# Patient Record
Sex: Male | Born: 1940
Health system: Southern US, Community
[De-identification: ages and names within clinical notes are randomized; demographics above are authoritative.]

## PROBLEM LIST (undated history)

## (undated) DIAGNOSIS — N529 Male erectile dysfunction, unspecified: Secondary | ICD-10-CM

## (undated) DIAGNOSIS — Z992 Dependence on renal dialysis: Secondary | ICD-10-CM

## (undated) DIAGNOSIS — N200 Calculus of kidney: Secondary | ICD-10-CM

## (undated) DIAGNOSIS — I219 Acute myocardial infarction, unspecified: Secondary | ICD-10-CM

## (undated) DIAGNOSIS — C801 Malignant (primary) neoplasm, unspecified: Secondary | ICD-10-CM

## (undated) DIAGNOSIS — Z87442 Personal history of urinary calculi: Secondary | ICD-10-CM

## (undated) DIAGNOSIS — I779 Disorder of arteries and arterioles, unspecified: Secondary | ICD-10-CM

## (undated) DIAGNOSIS — I10 Essential (primary) hypertension: Secondary | ICD-10-CM

## (undated) DIAGNOSIS — I739 Peripheral vascular disease, unspecified: Secondary | ICD-10-CM

## (undated) DIAGNOSIS — E785 Hyperlipidemia, unspecified: Secondary | ICD-10-CM

## (undated) DIAGNOSIS — I2510 Atherosclerotic heart disease of native coronary artery without angina pectoris: Secondary | ICD-10-CM

## (undated) DIAGNOSIS — Z9289 Personal history of other medical treatment: Secondary | ICD-10-CM

## (undated) DIAGNOSIS — K635 Polyp of colon: Secondary | ICD-10-CM

## (undated) DIAGNOSIS — N186 End stage renal disease: Secondary | ICD-10-CM

## (undated) HISTORY — DX: Hyperlipidemia, unspecified: E78.5

## (undated) HISTORY — DX: Essential (primary) hypertension: I10

## (undated) HISTORY — DX: Peripheral vascular disease, unspecified: I73.9

## (undated) HISTORY — PX: COLONOSCOPY W/ POLYPECTOMY: SHX1380

## (undated) HISTORY — PX: MOHS SURGERY: SHX181

## (undated) HISTORY — DX: Acute myocardial infarction, unspecified: I21.9

## (undated) HISTORY — DX: Calculus of kidney: N20.0

## (undated) HISTORY — DX: Atherosclerotic heart disease of native coronary artery without angina pectoris: I25.10

## (undated) HISTORY — DX: Disorder of arteries and arterioles, unspecified: I77.9

## (undated) HISTORY — DX: Polyp of colon: K63.5

## (undated) HISTORY — PX: HUMERUS FRACTURE SURGERY: SHX670

## (undated) HISTORY — DX: Malignant (primary) neoplasm, unspecified: C80.1

## (undated) HISTORY — DX: Male erectile dysfunction, unspecified: N52.9

## (undated) HISTORY — DX: Personal history of other medical treatment: Z92.89

## (undated) HISTORY — PX: CYSTOSCOPY: SUR368

---

## 2001-04-20 ENCOUNTER — Encounter: Payer: Self-pay | Admitting: Family Medicine

## 2001-04-20 ENCOUNTER — Encounter: Admission: RE | Admit: 2001-04-20 | Discharge: 2001-04-20 | Payer: Self-pay | Admitting: Family Medicine

## 2003-06-16 ENCOUNTER — Encounter: Admission: RE | Admit: 2003-06-16 | Discharge: 2003-06-16 | Payer: Self-pay | Admitting: Family Medicine

## 2003-11-07 ENCOUNTER — Ambulatory Visit (HOSPITAL_COMMUNITY): Admission: RE | Admit: 2003-11-07 | Discharge: 2003-11-07 | Payer: Self-pay | Admitting: General Surgery

## 2003-11-07 ENCOUNTER — Ambulatory Visit (HOSPITAL_BASED_OUTPATIENT_CLINIC_OR_DEPARTMENT_OTHER): Admission: RE | Admit: 2003-11-07 | Discharge: 2003-11-07 | Payer: Self-pay | Admitting: General Surgery

## 2003-11-07 ENCOUNTER — Encounter (INDEPENDENT_AMBULATORY_CARE_PROVIDER_SITE_OTHER): Payer: Self-pay | Admitting: Specialist

## 2004-04-13 ENCOUNTER — Ambulatory Visit: Payer: Self-pay | Admitting: Family Medicine

## 2004-06-10 ENCOUNTER — Ambulatory Visit: Payer: Self-pay | Admitting: Family Medicine

## 2004-07-29 ENCOUNTER — Ambulatory Visit: Payer: Self-pay | Admitting: Family Medicine

## 2004-08-30 ENCOUNTER — Ambulatory Visit: Payer: Self-pay | Admitting: Family Medicine

## 2004-10-26 ENCOUNTER — Ambulatory Visit: Payer: Self-pay | Admitting: Family Medicine

## 2004-11-26 ENCOUNTER — Ambulatory Visit: Payer: Self-pay | Admitting: Family Medicine

## 2005-03-07 ENCOUNTER — Ambulatory Visit: Payer: Self-pay | Admitting: Family Medicine

## 2005-03-08 ENCOUNTER — Encounter: Admission: RE | Admit: 2005-03-08 | Discharge: 2005-03-08 | Payer: Self-pay | Admitting: Family Medicine

## 2005-03-28 ENCOUNTER — Ambulatory Visit: Payer: Self-pay | Admitting: Cardiology

## 2005-04-11 ENCOUNTER — Ambulatory Visit: Payer: Self-pay

## 2005-04-11 ENCOUNTER — Ambulatory Visit: Payer: Self-pay | Admitting: Cardiology

## 2005-04-12 ENCOUNTER — Ambulatory Visit: Payer: Self-pay | Admitting: Family Medicine

## 2005-04-22 ENCOUNTER — Ambulatory Visit: Payer: Self-pay | Admitting: Family Medicine

## 2005-05-05 ENCOUNTER — Ambulatory Visit: Payer: Self-pay | Admitting: Family Medicine

## 2005-06-16 ENCOUNTER — Ambulatory Visit: Payer: Self-pay | Admitting: Family Medicine

## 2005-06-24 ENCOUNTER — Encounter: Admission: RE | Admit: 2005-06-24 | Discharge: 2005-09-22 | Payer: Self-pay | Admitting: Family Medicine

## 2005-07-18 ENCOUNTER — Ambulatory Visit: Payer: Self-pay | Admitting: Family Medicine

## 2005-10-17 ENCOUNTER — Ambulatory Visit: Payer: Self-pay | Admitting: Family Medicine

## 2005-10-24 ENCOUNTER — Encounter: Admission: RE | Admit: 2005-10-24 | Discharge: 2005-10-24 | Payer: Self-pay | Admitting: Nephrology

## 2005-11-14 ENCOUNTER — Ambulatory Visit: Payer: Self-pay | Admitting: Family Medicine

## 2006-01-17 ENCOUNTER — Ambulatory Visit: Payer: Self-pay | Admitting: Family Medicine

## 2006-03-23 ENCOUNTER — Ambulatory Visit: Payer: Self-pay | Admitting: Cardiology

## 2006-04-11 ENCOUNTER — Ambulatory Visit: Payer: Self-pay

## 2006-06-22 ENCOUNTER — Ambulatory Visit: Payer: Self-pay | Admitting: Family Medicine

## 2006-06-22 LAB — CONVERTED CEMR LAB: Hgb A1c MFr Bld: 8.2 % — ABNORMAL HIGH (ref 4.6–6.0)

## 2006-07-11 ENCOUNTER — Ambulatory Visit: Payer: Self-pay | Admitting: Family Medicine

## 2006-08-21 ENCOUNTER — Ambulatory Visit: Payer: Self-pay | Admitting: Family Medicine

## 2006-10-03 ENCOUNTER — Ambulatory Visit: Payer: Self-pay | Admitting: Family Medicine

## 2006-10-03 LAB — CONVERTED CEMR LAB
ALT: 22 units/L (ref 0–40)
AST: 19 units/L (ref 0–37)
Albumin: 3.8 g/dL (ref 3.5–5.2)
Basophils Absolute: 0.2 10*3/uL — ABNORMAL HIGH (ref 0.0–0.1)
Calcium: 9.1 mg/dL (ref 8.4–10.5)
Chloride: 106 meq/L (ref 96–112)
Cholesterol: 131 mg/dL (ref 0–200)
Creatinine,U: 67.1 mg/dL
Eosinophils Absolute: 1 10*3/uL — ABNORMAL HIGH (ref 0.0–0.6)
GFR calc non Af Amer: 40 mL/min
Hgb A1c MFr Bld: 7.2 % — ABNORMAL HIGH (ref 4.6–6.0)
MCHC: 34.5 g/dL (ref 30.0–36.0)
MCV: 90.1 fL (ref 78.0–100.0)
Monocytes Relative: 6.4 % (ref 3.0–11.0)
PSA: 0.76 ng/mL (ref 0.10–4.00)
Platelets: 182 10*3/uL (ref 150–400)
RBC: 4.78 M/uL (ref 4.22–5.81)
Sodium: 142 meq/L (ref 135–145)
Total CHOL/HDL Ratio: 3.2
Triglycerides: 149 mg/dL (ref 0–149)

## 2007-02-01 ENCOUNTER — Ambulatory Visit: Payer: Self-pay | Admitting: Family Medicine

## 2007-02-01 DIAGNOSIS — E785 Hyperlipidemia, unspecified: Secondary | ICD-10-CM | POA: Insufficient documentation

## 2007-02-01 DIAGNOSIS — I10 Essential (primary) hypertension: Secondary | ICD-10-CM | POA: Insufficient documentation

## 2007-02-01 DIAGNOSIS — Z794 Long term (current) use of insulin: Secondary | ICD-10-CM

## 2007-02-01 DIAGNOSIS — N259 Disorder resulting from impaired renal tubular function, unspecified: Secondary | ICD-10-CM | POA: Insufficient documentation

## 2007-02-01 DIAGNOSIS — I2510 Atherosclerotic heart disease of native coronary artery without angina pectoris: Secondary | ICD-10-CM

## 2007-02-01 DIAGNOSIS — I739 Peripheral vascular disease, unspecified: Secondary | ICD-10-CM | POA: Insufficient documentation

## 2007-02-01 DIAGNOSIS — E119 Type 2 diabetes mellitus without complications: Secondary | ICD-10-CM

## 2007-03-12 ENCOUNTER — Ambulatory Visit: Payer: Self-pay | Admitting: Gastroenterology

## 2007-03-20 ENCOUNTER — Ambulatory Visit: Payer: Self-pay | Admitting: Cardiology

## 2007-03-29 ENCOUNTER — Ambulatory Visit: Payer: Self-pay | Admitting: Gastroenterology

## 2007-03-29 ENCOUNTER — Encounter: Payer: Self-pay | Admitting: Gastroenterology

## 2007-03-29 DIAGNOSIS — D126 Benign neoplasm of colon, unspecified: Secondary | ICD-10-CM

## 2007-04-24 ENCOUNTER — Ambulatory Visit: Payer: Self-pay | Admitting: Family Medicine

## 2007-04-24 LAB — CONVERTED CEMR LAB: Hgb A1c MFr Bld: 6.8 % — ABNORMAL HIGH (ref 4.6–6.0)

## 2007-07-23 ENCOUNTER — Ambulatory Visit: Payer: Self-pay | Admitting: Family Medicine

## 2007-07-23 LAB — CONVERTED CEMR LAB
CO2: 28 meq/L (ref 19–32)
Creatinine, Ser: 1.9 mg/dL — ABNORMAL HIGH (ref 0.4–1.5)
Glucose, Bld: 67 mg/dL — ABNORMAL LOW (ref 70–99)
Potassium: 4.5 meq/L (ref 3.5–5.1)
Sodium: 143 meq/L (ref 135–145)

## 2007-08-10 ENCOUNTER — Telehealth: Payer: Self-pay | Admitting: Family Medicine

## 2007-08-16 DIAGNOSIS — E7800 Pure hypercholesterolemia, unspecified: Secondary | ICD-10-CM

## 2007-08-16 DIAGNOSIS — Z87442 Personal history of urinary calculi: Secondary | ICD-10-CM

## 2007-08-16 DIAGNOSIS — F528 Other sexual dysfunction not due to a substance or known physiological condition: Secondary | ICD-10-CM

## 2007-08-16 DIAGNOSIS — I214 Non-ST elevation (NSTEMI) myocardial infarction: Secondary | ICD-10-CM

## 2007-08-20 ENCOUNTER — Telehealth: Payer: Self-pay | Admitting: Family Medicine

## 2007-11-09 DIAGNOSIS — R112 Nausea with vomiting, unspecified: Secondary | ICD-10-CM

## 2007-11-12 ENCOUNTER — Ambulatory Visit: Payer: Self-pay | Admitting: Family Medicine

## 2007-11-15 ENCOUNTER — Telehealth: Payer: Self-pay | Admitting: Family Medicine

## 2007-11-15 ENCOUNTER — Ambulatory Visit: Payer: Self-pay | Admitting: Family Medicine

## 2007-11-15 ENCOUNTER — Inpatient Hospital Stay (HOSPITAL_COMMUNITY): Admission: EM | Admit: 2007-11-15 | Discharge: 2007-11-18 | Payer: Self-pay | Admitting: Family Medicine

## 2007-11-18 ENCOUNTER — Encounter: Payer: Self-pay | Admitting: Endocrinology

## 2007-11-20 ENCOUNTER — Ambulatory Visit: Payer: Self-pay | Admitting: Family Medicine

## 2007-11-26 ENCOUNTER — Telehealth: Payer: Self-pay | Admitting: Family Medicine

## 2008-02-06 ENCOUNTER — Telehealth: Payer: Self-pay | Admitting: Family Medicine

## 2008-03-13 ENCOUNTER — Ambulatory Visit: Payer: Self-pay | Admitting: Cardiology

## 2008-05-15 ENCOUNTER — Telehealth: Payer: Self-pay | Admitting: Family Medicine

## 2008-06-27 ENCOUNTER — Ambulatory Visit: Payer: Self-pay | Admitting: Family Medicine

## 2008-06-27 LAB — CONVERTED CEMR LAB
ALT: 45 units/L (ref 0–53)
AST: 32 units/L (ref 0–37)
Alkaline Phosphatase: 72 units/L (ref 39–117)
Basophils Absolute: 0 10*3/uL (ref 0.0–0.1)
Basophils Relative: 0.1 % (ref 0.0–3.0)
Blood in Urine, dipstick: NEGATIVE
CO2: 31 meq/L (ref 19–32)
Chloride: 104 meq/L (ref 96–112)
Creatinine, Ser: 2.5 mg/dL — ABNORMAL HIGH (ref 0.4–1.5)
Creatinine,U: 75 mg/dL
Direct LDL: 82.1 mg/dL
Eosinophils Absolute: 0.5 10*3/uL (ref 0.0–0.7)
Eosinophils Relative: 5.7 % — ABNORMAL HIGH (ref 0.0–5.0)
GFR calc non Af Amer: 27 mL/min
HDL: 33.1 mg/dL — ABNORMAL LOW (ref >39.0)
Hgb A1c MFr Bld: 7.4 % — ABNORMAL HIGH (ref 4.6–6.0)
Ketones, urine, test strip: NEGATIVE
Lymphocytes Relative: 17.4 % (ref 12.0–46.0)
MCHC: 33.4 g/dL (ref 30.0–36.0)
Neutrophils Relative %: 70 % (ref 43.0–77.0)
Nitrite: NEGATIVE
PSA: 0.77 ng/mL (ref 0.10–4.00)
Platelets: 289 10*3/uL (ref 150–400)
Potassium: 4.1 meq/L (ref 3.5–5.1)
RBC: 4.24 M/uL (ref 4.22–5.81)
TSH: 1.78 microintl units/mL (ref 0.35–5.50)
Total Bilirubin: 1 mg/dL (ref 0.3–1.2)
Triglycerides: 220 mg/dL (ref 0–149)
VLDL: 44 mg/dL — ABNORMAL HIGH (ref 0–40)
WBC Urine, dipstick: NEGATIVE
WBC: 8.7 10*3/uL (ref 4.5–10.5)

## 2008-06-30 ENCOUNTER — Telehealth: Payer: Self-pay | Admitting: Family Medicine

## 2008-07-21 ENCOUNTER — Ambulatory Visit: Payer: Self-pay | Admitting: Family Medicine

## 2008-09-22 ENCOUNTER — Telehealth: Payer: Self-pay | Admitting: Family Medicine

## 2008-09-29 ENCOUNTER — Ambulatory Visit: Payer: Self-pay | Admitting: Family Medicine

## 2008-09-29 LAB — CONVERTED CEMR LAB
Calcium: 8.9 mg/dL (ref 8.4–10.5)
GFR calc non Af Amer: 37.59 mL/min (ref >60)
Glucose, Bld: 97 mg/dL (ref 70–99)
Hgb A1c MFr Bld: 7 % — ABNORMAL HIGH (ref 4.6–6.5)
Potassium: 3.8 meq/L (ref 3.5–5.1)
Sodium: 145 meq/L (ref 135–145)

## 2008-10-02 ENCOUNTER — Ambulatory Visit: Payer: Self-pay | Admitting: Family Medicine

## 2008-10-02 DIAGNOSIS — M109 Gout, unspecified: Secondary | ICD-10-CM | POA: Insufficient documentation

## 2008-11-17 ENCOUNTER — Telehealth: Payer: Self-pay | Admitting: Family Medicine

## 2009-01-21 ENCOUNTER — Encounter (INDEPENDENT_AMBULATORY_CARE_PROVIDER_SITE_OTHER): Payer: Self-pay | Admitting: *Deleted

## 2009-01-26 ENCOUNTER — Ambulatory Visit: Payer: Self-pay | Admitting: Family Medicine

## 2009-01-26 LAB — CONVERTED CEMR LAB
BUN: 45 mg/dL — ABNORMAL HIGH (ref 6–23)
Chloride: 108 meq/L (ref 96–112)
GFR calc non Af Amer: 30.13 mL/min (ref >60)
Hgb A1c MFr Bld: 7.4 % — ABNORMAL HIGH (ref 4.6–6.5)
Potassium: 4.1 meq/L (ref 3.5–5.1)
Sodium: 145 meq/L (ref 135–145)

## 2009-02-02 ENCOUNTER — Ambulatory Visit: Payer: Self-pay | Admitting: Family Medicine

## 2009-03-04 ENCOUNTER — Telehealth: Payer: Self-pay | Admitting: Family Medicine

## 2009-05-04 ENCOUNTER — Ambulatory Visit: Payer: Self-pay | Admitting: Cardiology

## 2009-06-29 ENCOUNTER — Ambulatory Visit: Payer: Self-pay | Admitting: Family Medicine

## 2009-06-29 LAB — CONVERTED CEMR LAB
ALT: 22 units/L (ref 0–53)
Alkaline Phosphatase: 82 units/L (ref 39–117)
Bilirubin, Direct: 0.1 mg/dL (ref 0.0–0.3)
CO2: 31 meq/L (ref 19–32)
Chloride: 104 meq/L (ref 96–112)
Creatinine, Ser: 2.5 mg/dL — ABNORMAL HIGH (ref 0.4–1.5)
Direct LDL: 55.1 mg/dL
HDL: 31.4 mg/dL — ABNORMAL LOW (ref >39.00)
Potassium: 4.1 meq/L (ref 3.5–5.1)
Sodium: 143 meq/L (ref 135–145)
Total Bilirubin: 1.1 mg/dL (ref 0.3–1.2)
Total CHOL/HDL Ratio: 4
Total Protein: 6.7 g/dL (ref 6.0–8.3)
Triglycerides: 233 mg/dL — ABNORMAL HIGH (ref 0.0–149.0)

## 2009-07-06 ENCOUNTER — Ambulatory Visit: Payer: Self-pay | Admitting: Family Medicine

## 2009-07-06 LAB — CONVERTED CEMR LAB
Glucose, Urine, Semiquant: NEGATIVE
Ketones, urine, test strip: NEGATIVE
Nitrite: NEGATIVE
Specific Gravity, Urine: 1.01
WBC Urine, dipstick: NEGATIVE
pH: 6

## 2009-07-07 LAB — CONVERTED CEMR LAB
Basophils Absolute: 0.1 10*3/uL (ref 0.0–0.1)
Creatinine,U: 27.1 mg/dL
Eosinophils Absolute: 0.4 10*3/uL (ref 0.0–0.7)
Lymphocytes Relative: 20.2 % (ref 12.0–46.0)
MCHC: 33 g/dL (ref 30.0–36.0)
Microalb, Ur: 9.1 mg/dL — ABNORMAL HIGH (ref 0.0–1.9)
Monocytes Relative: 7.6 % (ref 3.0–12.0)
Neutrophils Relative %: 65 % (ref 43.0–77.0)
RDW: 14 % (ref 11.5–14.6)
TSH: 2.39 microintl units/mL (ref 0.35–5.50)

## 2009-09-21 ENCOUNTER — Ambulatory Visit: Payer: Self-pay | Admitting: Family Medicine

## 2009-09-21 LAB — CONVERTED CEMR LAB
BUN: 37 mg/dL — ABNORMAL HIGH (ref 6–23)
CO2: 30 meq/L (ref 19–32)
Chloride: 107 meq/L (ref 96–112)
Creatinine, Ser: 2.1 mg/dL — ABNORMAL HIGH (ref 0.4–1.5)
Glucose, Bld: 81 mg/dL (ref 70–99)

## 2009-09-28 ENCOUNTER — Ambulatory Visit: Payer: Self-pay | Admitting: Family Medicine

## 2009-12-22 ENCOUNTER — Ambulatory Visit: Payer: Self-pay | Admitting: Family Medicine

## 2009-12-22 LAB — CONVERTED CEMR LAB
BUN: 44 mg/dL — ABNORMAL HIGH (ref 6–23)
CO2: 31 meq/L (ref 19–32)
Calcium: 8.8 mg/dL (ref 8.4–10.5)
Creatinine, Ser: 2.2 mg/dL — ABNORMAL HIGH (ref 0.4–1.5)
Glucose, Bld: 97 mg/dL (ref 70–99)
Sodium: 142 meq/L (ref 135–145)

## 2009-12-23 ENCOUNTER — Encounter: Payer: Self-pay | Admitting: *Deleted

## 2010-01-04 ENCOUNTER — Ambulatory Visit: Payer: Self-pay | Admitting: Family Medicine

## 2010-01-12 ENCOUNTER — Telehealth: Payer: Self-pay | Admitting: Family Medicine

## 2010-03-09 ENCOUNTER — Encounter: Payer: Self-pay | Admitting: Gastroenterology

## 2010-04-05 ENCOUNTER — Ambulatory Visit: Payer: Self-pay | Admitting: Family Medicine

## 2010-04-05 LAB — CONVERTED CEMR LAB
BUN: 44 mg/dL — ABNORMAL HIGH (ref 6–23)
GFR calc non Af Amer: 32.45 mL/min (ref >60)
Glucose, Bld: 84 mg/dL (ref 70–99)
Potassium: 3.4 meq/L — ABNORMAL LOW (ref 3.5–5.1)

## 2010-04-12 ENCOUNTER — Ambulatory Visit: Payer: Self-pay | Admitting: Family Medicine

## 2010-05-15 DIAGNOSIS — I2510 Atherosclerotic heart disease of native coronary artery without angina pectoris: Secondary | ICD-10-CM

## 2010-05-15 HISTORY — DX: Atherosclerotic heart disease of native coronary artery without angina pectoris: I25.10

## 2010-06-28 ENCOUNTER — Encounter: Payer: Self-pay | Admitting: Family Medicine

## 2010-06-28 ENCOUNTER — Ambulatory Visit
Admission: RE | Admit: 2010-06-28 | Discharge: 2010-06-28 | Payer: Self-pay | Source: Home / Self Care | Attending: Family Medicine | Admitting: Family Medicine

## 2010-06-28 ENCOUNTER — Other Ambulatory Visit: Payer: Self-pay | Admitting: Family Medicine

## 2010-06-28 LAB — BASIC METABOLIC PANEL
CO2: 31 mEq/L (ref 19–32)
Calcium: 9.4 mg/dL (ref 8.4–10.5)
Chloride: 101 mEq/L (ref 96–112)
Potassium: 4.5 mEq/L (ref 3.5–5.1)
Sodium: 141 mEq/L (ref 135–145)

## 2010-06-28 LAB — CBC WITH DIFFERENTIAL/PLATELET
Basophils Relative: 0.8 % (ref 0.0–3.0)
Eosinophils Absolute: 0.4 10*3/uL (ref 0.0–0.7)
HCT: 42.5 % (ref 39.0–52.0)
Hemoglobin: 14.4 g/dL (ref 13.0–17.0)
Lymphocytes Relative: 20.5 % (ref 12.0–46.0)
Lymphs Abs: 1.8 10*3/uL (ref 0.7–4.0)
MCHC: 33.9 g/dL (ref 30.0–36.0)
Neutro Abs: 5.7 10*3/uL (ref 1.4–7.7)
RBC: 4.57 Mil/uL (ref 4.22–5.81)

## 2010-06-28 LAB — HEPATIC FUNCTION PANEL
AST: 18 U/L (ref 0–37)
Albumin: 4 g/dL (ref 3.5–5.2)
Alkaline Phosphatase: 82 U/L (ref 39–117)
Total Protein: 6.7 g/dL (ref 6.0–8.3)

## 2010-06-28 LAB — CONVERTED CEMR LAB
Ketones, urine, test strip: NEGATIVE
Nitrite: NEGATIVE
Urobilinogen, UA: 0.2

## 2010-06-28 LAB — MICROALBUMIN / CREATININE URINE RATIO: Microalb, Ur: 6.5 mg/dL — ABNORMAL HIGH (ref 0.0–1.9)

## 2010-07-04 LAB — CONVERTED CEMR LAB
BUN: 43 mg/dL — ABNORMAL HIGH (ref 6–23)
Bilirubin Urine: NEGATIVE
CO2: 29 meq/L (ref 19–32)
GFR calc Af Amer: 41 mL/min
Glucose, Bld: 111 mg/dL — ABNORMAL HIGH (ref 70–99)
Potassium: 4 meq/L (ref 3.5–5.1)
Sodium: 139 meq/L (ref 135–145)
Urobilinogen, UA: 0.2
WBC Urine, dipstick: NEGATIVE

## 2010-07-08 NOTE — Assessment & Plan Note (Signed)
Summary: 3 month rov/njr   Vital Signs:  Patient profile:   70 year old male Weight:      180 pounds Temp:     98.5 degrees F oral BP sitting:   130 / 80  (left arm) Cuff size:   regular  Vitals Entered By: Kern Reap CMA Duncan Dull) (January 04, 2010 10:04 AM)  CC: follow-up visit Is Patient Diabetic? Yes Did you bring your meter with you today? No   Primary Care Provider:  Roderick Pee MD  CC:  follow-up visit.  History of Present Illness: Douglas Meyer is a 70 year old, married male, nonsmoker, who comes in today for follow-up of diabetes, type I.  he states he feels well and has no complications from his medication.  This morning.  His blood sugar fasting was 63.  He states is averaging about 100 at home.  His BUN was 37, now is 44, creatinine 2.1, now 2.2, GFR 33, now 32.  Therefore, no significant change in renal function over the past 3 months.  His hemoglobin A1c was 7.2, now at 7.4.  However, since he is getting low sugars in the morning.  Will continue his current medication    Allergies (verified): No Known Drug Allergies  Past History:  Past medical, surgical, family and social histories (including risk factors) reviewed for relevance to current acute and chronic problems.  Past Medical History: Reviewed history from 05/04/2009 and no changes required. Coronary artery disease (out of hospital myocardial infarction in     the 1990's.  This was picked up on an EKG in 1997.  Cardiac     catheterization demonstrated an occluded vessel and collaterals.  I     have no description of this.  A stress perfusion study done 2008      demonstrated an ejection fraction of 53% with inferolateral     hypokinesis.  There was an inferolateral defect with scar and some     mild mixed ischemia.  We managed this medically). Diabetes mellitus, type I Hyperlipidemia Hypertension Peripheral vascular disease Renal insufficiency ED Nephrolithiasis, hx of  Past Surgical History: Reviewed  history from 08/16/2007 and no changes required. Lt Humerus Fracture Multiple skin cancers  Family History: Reviewed history from 07/23/2007 and no changes required. father died 70, MI, smoker mother died of an MI 3 brothers, one died, diabetes, rheumatic fever peptic ulcer disease.  The others had two heart attacks in this diabetic type II one sister died in a fire one died at 19 of diabetes  Social History: Reviewed history from 07/23/2007 and no changes required. Retired Engineer, production Married Former Smoker Alcohol use-yes Alcohol use-no Drug use-no Regular exercise-yes  Review of Systems      See HPI  Physical Exam  General:  Well-developed,well-nourished,in no acute distress; alert,appropriate and cooperative throughout examination  Diabetes Management Exam:    Foot Exam (with socks and/or shoes not present):       Sensory-Pinprick/Light touch:          Left medial foot (L-4): normal          Left dorsal foot (L-5): normal          Left lateral foot (S-1): normal          Right medial foot (L-4): normal          Right dorsal foot (L-5): normal          Right lateral foot (S-1): normal       Sensory-Monofilament:  Left foot: normal          Right foot: normal       Inspection:          Left foot: normal          Right foot: normal       Nails:          Left foot: normal          Right foot: normal    Eye Exam:       Eye Exam done elsewhere          Date: 12/09/2009          Results: normal          Done by: bevis   Impression & Recommendations:  Problem # 1:  DIABETES MELLITUS, TYPE I (ICD-250.01) Assessment Unchanged  His updated medication list for this problem includes:    Glipizide 10 Mg Tb24 (Glipizide) .Marland Kitchen..Marland Kitchen Two tabs daily    Lantus Solostar 100 Unit/ml Soln (Insulin glargine) .Marland Kitchen... 31 u    Aspirin 325 Mg Tabs (Aspirin) .Marland Kitchen... 1 by mouth daily    Tarka 4-240 Mg Cr-tabs (Trandolapril-verapamil hcl) .Marland Kitchen... Take one tab at bedtime  Complete  Medication List: 1)  Glipizide 10 Mg Tb24 (Glipizide) .... Two tabs daily 2)  Metoprolol Succinate 200 Mg Tb24 (Metoprolol succinate) .Marland Kitchen.. 1 once daily 3)  Nitroquick 0.4 Mg Subl (Nitroglycerin) .... As needed 4)  Lantus Solostar 100 Unit/ml Soln (Insulin glargine) .... 31 u 5)  Aspirin 325 Mg Tabs (Aspirin) .Marland Kitchen.. 1 by mouth daily 6)  Furosemide 80 Mg Tabs (Furosemide) .... Two tabs a day 7)  Multi-vitamin 300  .... Once daily 8)  Tarka 4-240 Mg Cr-tabs (Trandolapril-verapamil hcl) .... Take one tab at bedtime 9)  Hectorol 0.5 Mcg Caps (Doxercalciferol) .... Take three  tabs every morning 10)  Levitra 20 Mg Tabs (Vardenafil hcl) .... Uad 11)  Vitamin C 500 Mg Tabs (Ascorbic acid) .... Once daily 12)  Accu-chek Multiclix Lancets Misc (Lancets) .... Use to check glucose daily 13)  Accu-chek Aviva Strp (Glucose blood) .... Use daily to check glucose 14)  Bd Insulin Syringe Ultrafine 31g X 5/16" 1 Ml Misc (Insulin syringe-needle u-100) .... Use daily for glucose control 15)  Ketoconazole 2 % Crea (Ketoconazole) .... Two times a day 16)  Allopurinol 300 Mg Tabs (Allopurinol) .... Take one tab by mouth once daily  Patient Instructions: 1)  Please schedule a follow-up appointment in 3 months..250.01 2)  BMP prior to visit, ICD-9: 3)  HbgA1C prior to visit, ICD-9:

## 2010-07-08 NOTE — Assessment & Plan Note (Signed)
Summary: 3 month rov/njr   Vital Signs:  Patient profile:   70 year old male Weight:      181 pounds Temp:     98.4 degrees F oral BP sitting:   124 / 70  (left arm) Cuff size:   regular  Vitals Entered By: Kern Reap CMA Duncan Dull) (September 28, 2009 1:33 PM)  CC: follow-up visit Is Patient Diabetic? Yes Did you bring your meter with you today? No   Primary Care Provider:  Roderick Pee MD  CC:  follow-up visit.  History of Present Illness: Douglas Meyer is a 70 y/o male ns who comes in fro evel. of diabetes.  His current treatment program is 31 units of Lantus nightly along with glipizide 10 mg b.i.d..  His fasting blood sugars 81, with a hemoglobin A1c of 7.2%.  He is having episodes once or twice a week where his blood sugar drops into the 50s and 60s.  Dr. Raquel James his nephrologist, stopped his simvastatin, and allopurinol.  Renal function shows a GFR of 33.4, with a creatinine of 2.1.  Allergies: No Known Drug Allergies PMH-FH-SH reviewed for relevance  Review of Systems      See HPI  Physical Exam  General:  Well-developed,well-nourished,in no acute distress; alert,appropriate and cooperative throughout examination   Impression & Recommendations:  Problem # 1:  DIABETES MELLITUS, TYPE I (ICD-250.01) Assessment Improved  His updated medication list for this problem includes:    Glipizide 10 Mg Tb24 (Glipizide) .Marland Kitchen..Marland Kitchen Two tabs daily    Lantus Solostar 100 Unit/ml Soln (Insulin glargine) .Marland Kitchen... 31 u    Aspirin 325 Mg Tabs (Aspirin) .Marland Kitchen... 1 by mouth daily    Tarka 4-240 Mg Cr-tabs (Trandolapril-verapamil hcl) .Marland Kitchen... Take one tab at bedtime  Complete Medication List: 1)  Glipizide 10 Mg Tb24 (Glipizide) .... Two tabs daily 2)  Metoprolol Succinate 200 Mg Tb24 (Metoprolol succinate) .Marland Kitchen.. 1 once daily 3)  Nitroquick 0.4 Mg Subl (Nitroglycerin) .... As needed 4)  Lantus Solostar 100 Unit/ml Soln (Insulin glargine) .... 31 u 5)  Aspirin 325 Mg Tabs (Aspirin) .Marland Kitchen.. 1 by mouth  daily 6)  Furosemide 80 Mg Tabs (Furosemide) .... Two tabs a day 7)  Multi-vitamin 300  .... Once daily 8)  Tarka 4-240 Mg Cr-tabs (Trandolapril-verapamil hcl) .... Take one tab at bedtime 9)  Hectorol 0.5 Mcg Caps (Doxercalciferol) .... Take three  tabs every morning 10)  Levitra 20 Mg Tabs (Vardenafil hcl) .... Uad 11)  Vitamin C 500 Mg Tabs (Ascorbic acid) .... Once daily 12)  Accu-chek Multiclix Lancets Misc (Lancets) .... Use to check glucose daily 13)  Accu-chek Aviva Strp (Glucose blood) .... Use daily to check glucose 14)  Bd Insulin Syringe Ultrafine 31g X 5/16" 1 Ml Misc (Insulin syringe-needle u-100) .... Use daily for glucose control 15)  Ketoconazole 2 % Crea (Ketoconazole) .... Two times a day  Patient Instructions: 1)  See your eye doctor yearly to check for diabetic eye damage. 2)  continue current treatment program.  Follow-up in 3 months 3)  BMP prior to visit, ICD-9:...........250.01 4)  HbgA1C prior to visit, ICD-9:

## 2010-07-08 NOTE — Assessment & Plan Note (Signed)
Summary: cpx/mm   Vital Signs:  Patient profile:   70 year old male Height:      65.75 inches Weight:      174 pounds BP sitting:   180 / 92  (left arm) Cuff size:   regular  Vitals Entered By: Kern Reap CMA Duncan Dull) (July 06, 2009 8:33 AM)  Reason for Visit cpx   Primary Care Provider:  Roderick Pee MD   History of Present Illness: Douglas Meyer is a 70 year old male, married, nonsmoker, who comes in today for evaluation of multiple issues.  His underlying hypertension, for which he takes a beta-blocker to her milligrams daily, Lasix, 80 mg b.i.d., Tarka 4 -- 240 nightly  BP today 180/92.  These did to see his nephrologist this week because his blood pressure is not under good control.  His hyperlipidemia is managed with simvastatin 80 mg nightly, LDL is 55.  His diabetes is managed with glipizide 10 mg b.i.d., Lantus 31 units nightly.  Blood sugar 83.  A1c today.  He takes allopurinol 3 mg daily for gout.  Will check a uric acid level today.  He also has chronic renal disease and renal insufficiency.  BUN 44, creatinine 2.5, GFR 27.  He's been going to an optical shop for an ophthalmologic examination recommended.  He s C. Dr. Kendrick Ranch ophthalmologist.  He gets routine dental care.  Colonoscopy two years ago, was normal except for two small polyps.  His cardiology evaluation by Dr. Leta Jungling was normal.  He told him to come back in 18 months.  Tetanus 2002, seasonal flu 2010, Pneumovax 2008, shingles 2010.  He's also tried various medications for recto dysfunction.  Nothing seems to work.  Recommend urology consult p.r.n.  Allergies (verified): No Known Drug Allergies  Past History:  Past medical, surgical, family and social histories (including risk factors) reviewed, and no changes noted (except as noted below).  Past Medical History: Reviewed history from 05/04/2009 and no changes required. Coronary artery disease (out of hospital myocardial infarction in  the 1990's.  This was picked up on an EKG in 1997.  Cardiac     catheterization demonstrated an occluded vessel and collaterals.  I     have no description of this.  A stress perfusion study done 2008      demonstrated an ejection fraction of 53% with inferolateral     hypokinesis.  There was an inferolateral defect with scar and some     mild mixed ischemia.  We managed this medically). Diabetes mellitus, type I Hyperlipidemia Hypertension Peripheral vascular disease Renal insufficiency ED Nephrolithiasis, hx of  Past Surgical History: Reviewed history from 08/16/2007 and no changes required. Lt Humerus Fracture Multiple skin cancers  Family History: Reviewed history from 07/23/2007 and no changes required. father died 84, MI, smoker mother died of an MI 3 brothers, one died, diabetes, rheumatic fever peptic ulcer disease.  The others had two heart attacks in this diabetic type II one sister died in a fire one died at 71 of diabetes  Social History: Reviewed history from 07/23/2007 and no changes required. Retired Engineer, production Married Former Smoker Alcohol use-yes Alcohol use-no Drug use-no Regular exercise-yes  Review of Systems      See HPI  Physical Exam  General:  Well-developed,well-nourished,in no acute distress; alert,appropriate and cooperative throughout examination Head:  Normocephalic and atraumatic without obvious abnormalities. No apparent alopecia or balding. Eyes:  No corneal or conjunctival inflammation noted. EOMI. Perrla. Funduscopic exam benign, without hemorrhages, exudates or papilledema.  Vision grossly normal. Ears:  External ear exam shows no significant lesions or deformities.  Otoscopic examination reveals clear canals, tympanic membranes are intact bilaterally without bulging, retraction, inflammation or discharge. Hearing is grossly normal bilaterally. Nose:  External nasal examination shows no deformity or inflammation. Nasal mucosa are pink and  moist without lesions or exudates. Mouth:  Oral mucosa and oropharynx without lesions or exudates.  Teeth in good repair. Neck:  No deformities, masses, or tenderness noted. Chest Wall:  No deformities, masses, tenderness or gynecomastia noted. Breasts:  No masses or gynecomastia noted Lungs:  Normal respiratory effort, chest expands symmetrically. Lungs are clear to auscultation, no crackles or wheezes. Heart:  Normal rate and regular rhythm. S1 and S2 normal without gallop, murmur, click, rub or other extra sounds. Abdomen:  Bowel sounds positive,abdomen soft and non-tender without masses, organomegaly or hernias noted. Rectal:  No external abnormalities noted. Normal sphincter tone. No rectal masses or tenderness. Genitalia:  Testes bilaterally descended without nodularity, tenderness or masses. No scrotal masses or lesions. No penis lesions or urethral discharge. Prostate:  Prostate gland firm and smooth, no enlargement, nodularity, tenderness, mass, asymmetry or induration. Msk:  No deformity or scoliosis noted of thoracic or lumbar spine.   Pulses:  R and L carotid,radial,femoral,dorsalis pedis and posterior tibial pulses are full and equal bilaterally Extremities:  No clubbing, cyanosis, edema, or deformity noted with normal full range of motion of all joints.   Neurologic:  No cranial nerve deficits noted. Station and gait are normal. Plantar reflexes are down-going bilaterally. DTRs are symmetrical throughout. Sensory, motor and coordinative functions appear intact. Skin:  Intact without suspicious lesions or rashes...........Marland KitchenAllergies:  his toe nail shows severe fungal disease.  Recommend soaking in filing weekly Cervical Nodes:  No lymphadenopathy noted Axillary Nodes:  No palpable lymphadenopathy Inguinal Nodes:  No significant adenopathy Psych:  Cognition and judgment appear intact. Alert and cooperative with normal attention span and concentration. No apparent delusions, illusions,  hallucinations  Diabetes Management Exam:    Foot Exam (with socks and/or shoes not present):       Sensory-Pinprick/Light touch:          Left medial foot (L-4): normal          Left dorsal foot (L-5): normal          Left lateral foot (S-1): normal          Right medial foot (L-4): normal          Right dorsal foot (L-5): normal          Right lateral foot (S-1): normal       Sensory-Monofilament:          Left foot: normal          Right foot: normal       Inspection:          Left foot: normal          Right foot: normal       Nails:          Left foot: normal          Right foot: normal    Eye Exam:       Eye Exam done elsewhere          Date: 08/20/2007          Results: normal          Done by: optom   Impression & Recommendations:  Problem # 1:  GOUT, UNSPECIFIED (ICD-274.9)  Assessment Improved  His updated medication list for this problem includes:    Allopurinol 300 Mg Tabs (Allopurinol) .Marland Kitchen... Take one tab once daily  Orders: Venipuncture ([address]) TLB-CBC Platelet - w/Differential (85025-CBCD) TLB-TSH (Thyroid Stimulating Hormone) (84443-TSH) TLB-Microalbumin/Creat Ratio, Urine (82043-MALB) TLB-PSA (Prostate Specific Antigen) (84153-PSA) TLB-A1C / Hgb A1C (Glycohemoglobin) (83036-A1C) TLB-Uric Acid, Blood (84550-URIC) Prescription Created Electronically [phone]) UA Dipstick w/o Micro (automated)  (81003)  Problem # 2:  ERECTILE DYSFUNCTION (ICD-302.72) Assessment: Deteriorated  His updated medication list for this problem includes:    Levitra 20 Mg Tabs (Vardenafil hcl) ..... Uad  Problem # 3:  HYPERCHOLESTEROLEMIA (ICD-272.0) Assessment: Improved  His updated medication list for this problem includes:    Simvastatin 80 Mg Tabs (Simvastatin) .Marland Kitchen... 1 once daily  Problem # 4:  RENAL INSUFFICIENCY (ICD-588.9) Assessment: Unchanged  Orders: Venipuncture (09811) TLB-CBC Platelet - w/Differential (85025-CBCD) TLB-TSH (Thyroid Stimulating Hormone)  (84443-TSH) TLB-Microalbumin/Creat Ratio, Urine (82043-MALB) TLB-PSA (Prostate Specific Antigen) (84153-PSA) TLB-A1C / Hgb A1C (Glycohemoglobin) (83036-A1C) TLB-Uric Acid, Blood (84550-URIC) Prescription Created Electronically (B1478) UA Dipstick w/o Micro (automated)  (81003)  Problem # 5:  HYPERTENSION (ICD-401.9) Assessment: Deteriorated  His updated medication list for this problem includes:    Metoprolol Succinate 200 Mg Tb24 (Metoprolol succinate) .Marland Kitchen... 1 once daily    Furosemide 80 Mg Tabs (Furosemide) .Marland Kitchen..Marland Kitchen Two tabs a day    Tarka 4-240 Mg Cr-tabs (Trandolapril-verapamil hcl) .Marland Kitchen... Take one tab at bedtime  Orders: Venipuncture (29562) TLB-CBC Platelet - w/Differential (85025-CBCD) TLB-TSH (Thyroid Stimulating Hormone) (84443-TSH) TLB-Microalbumin/Creat Ratio, Urine (82043-MALB) TLB-PSA (Prostate Specific Antigen) (84153-PSA) TLB-A1C / Hgb A1C (Glycohemoglobin) (83036-A1C) TLB-Uric Acid, Blood (84550-URIC) Prescription Created Electronically [phone]) UA Dipstick w/o Micro (automated)  (81003)  Problem # 6:  DIABETES MELLITUS, TYPE I (ICD-250.01) Assessment: Improved  His updated medication list for this problem includes:    Glipizide 10 Mg Tb24 (Glipizide) .Marland Kitchen..Marland Kitchen Two tabs daily    Lantus Solostar 100 Unit/ml Soln (Insulin glargine) .Marland Kitchen... 31 u    Aspirin 325 Mg Tabs (Aspirin) .Marland Kitchen... 1 by mouth daily    Tarka 4-240 Mg Cr-tabs (Trandolapril-verapamil hcl) .Marland Kitchen... Take one tab at bedtime  Orders: Venipuncture (57846) TLB-CBC Platelet - w/Differential (85025-CBCD) TLB-TSH (Thyroid Stimulating Hormone) (84443-TSH) TLB-Microalbumin/Creat Ratio, Urine (82043-MALB) TLB-PSA (Prostate Specific Antigen) (84153-PSA) TLB-A1C / Hgb A1C (Glycohemoglobin) (83036-A1C) TLB-Uric Acid, Blood (84550-URIC) Prescription Created Electronically [phone]) UA Dipstick w/o Micro (automated)  (81003)  Complete Medication List: 1)  Glipizide 10 Mg Tb24 (Glipizide) .... Two tabs daily 2)  Metoprolol  Succinate 200 Mg Tb24 (Metoprolol succinate) .Marland Kitchen.. 1 once daily 3)  Simvastatin 80 Mg Tabs (Simvastatin) .Marland Kitchen.. 1 once daily 4)  Nitroquick 0.4 Mg Subl (Nitroglycerin) .... As needed 5)  Lantus Solostar 100 Unit/ml Soln (Insulin glargine) .... 31 u 6)  Aspirin 325 Mg Tabs (Aspirin) .Marland Kitchen.. 1 by mouth daily 7)  Furosemide 80 Mg Tabs (Furosemide) .... Two tabs a day 8)  Multi-vitamin 300  .... Once daily 9)  Tarka 4-240 Mg Cr-tabs (Trandolapril-verapamil hcl) .... Take one tab at bedtime 10)  Hectorol 0.5 Mcg Caps (Doxercalciferol) .... Take three  tabs every morning 11)  Levitra 20 Mg Tabs (Vardenafil hcl) .... Uad 12)  Vitamin C 500 Mg Tabs (Ascorbic acid) .... Once daily 13)  Accu-chek Multiclix Lancets Misc (Lancets) .... Use to check glucose daily 14)  Accu-chek Aviva Strp (Glucose blood) .... Use daily to check glucose 15)  Bd Insulin Syringe Ultrafine 31g X 5/16" 1 Ml Misc (Insulin syringe-needle u-100) .... Use daily for glucose control 16)  Allopurinol  300 Mg Tabs (Allopurinol) .... Take one tab once daily  Patient Instructions: 1)  See your eye doctor yearly to check for diabetic eye damage...................Marland KitchenDr. Mia Creek 2)  Please schedule a follow-up appointment in 3 months. 3)  Check your blood sugars regularly. If your readings are usually above : or below 70 you should contact our office. 4)  It is important that your Diabetic A1c level is checked every 3 months. 5)  See your eye doctor yearly to check for diabetic eye damage. 6)  Check your feet each night for sore areas, calluses or signs of infection. 7)  Check your Blood Pressure regularly. If it is above: you should make an appointment. 8)  BMP prior to visit, ICD-9:.........250.01 9)  HbgA1C prior to visit, ICD-9: Prescriptions: ALLOPURINOL 300 MG TABS (ALLOPURINOL) take one tab once daily  #90 x 3   Entered and Authorized by:   Roderick Pee MD   Signed by:   Roderick Pee MD on 07/06/2009   Method used:    Electronically to        [facility]  [address] [facility]* (retail)       [address]       [place], [place]  [address]       Ph: [phone] or [phone]       Fax: [phone]   RxID:   [id number] BD INSULIN SYRINGE ULTRAFINE 31G X 5/16" 1 ML MISC (INSULIN SYRINGE-NEEDLE U-100) use daily for glucose control  #100 x 3   Entered and Authorized by:   Roderick Pee MD   Signed by:   Roderick Pee MD on 07/06/2009   Method used:   Electronically to        [facility]  [address] [facility]* (retail)       [address]       [place], [place]  [address]       Ph: [phone] or [phone]       Fax: [phone]   RxID:   [id number] ACCU-CHEK AVIVA  STRP (GLUCOSE BLOOD) use daily to check glucose  #90 x prn   Entered and Authorized by:   Roderick Pee MD   Signed by:   Roderick Pee MD on 07/06/2009   Method used:   Electronically to        [facility]  [address] [facility]* (retail)       [address]       [place], [place]  [address]       Ph: [phone] or [phone]       Fax: [phone]   RxID:   [id number] ACCU-CHEK MULTICLIX LANCETS  MISC (LANCETS) use to check glucose daily  #100 x prn   Entered and Authorized by:   Roderick Pee MD   Signed by:   Roderick Pee MD on 07/06/2009   Method used:   Electronically to        [facility] [address] [facility]* (retail)       [address]       [place], [place]  [address]       Ph: [phone] or [phone]       Fax: [phone]   RxID:   [id number] TARKA 4-240 MG CR-TABS (TRANDOLAPRIL-VERAPAMIL HCL) take  one tab at bedtime  #100 x 4   Entered and Authorized by:   Roderick Pee MD   Signed by:   Roderick Pee MD on 07/06/2009   Method used:   Electronically to        [facility] [address] [facility]* (retail)       [address]       [place], [place]  [address]       Ph: [phone] or  [phone]       Fax: [phone]   RxID:   [id number] FUROSEMIDE 80 MG  TABS (FUROSEMIDE) two tabs a day  #200 x 4   Entered and Authorized by:   Roderick Pee MD   Signed by:   Roderick Pee MD on 07/06/2009   Method used:   Electronically to        [facility]  [address] [facility]* (retail)       [address]       [place], [place]  [address]       Ph: [phone] or [phone]       Fax: [phone]   RxID:   [id number] LANTUS SOLOSTAR 100 UNIT/ML  SOLN (INSULIN GLARGINE) 31 u  #2 x 4   Entered and Authorized by:   Roderick Pee MD   Signed by:   Roderick Pee MD on 07/06/2009   Method used:   Electronically to        [facility]  [address] [facility]* (retail)       [address]       [place], [place]  [address]       Ph: [phone] or [phone]       Fax: [phone]   RxID:   [id number] NITROQUICK 0.4 MG  SUBL (NITROGLYCERIN) as needed  #25 x 1   Entered and Authorized by:   Roderick Pee MD   Signed by:   Roderick Pee MD on 07/06/2009   Method used:   Electronically to        [facility]  [address] [facility]* (retail)       [address]       [place], [place]  [address]       Ph: [phone] or [phone]       Fax: [phone]   RxID:   [id number] SIMVASTATIN 80 MG TABS (SIMVASTATIN) 1 once daily  #100 x 4   Entered and Authorized by:   Roderick Pee MD   Signed by:   Roderick Pee MD on 07/06/2009   Method used:   Electronically to        [facility]  [address] [facility]* (retail)       [address]       [place], [place]  [address]       Ph: [phone] or [phone]       Fax: [phone]   RxID:   [id number] METOPROLOL SUCCINATE 200 MG TB24 (METOPROLOL SUCCINATE) 1 once daily  #100 x 4   Entered and Authorized by:   Roderick Pee MD   Signed by:   Roderick Pee MD on 07/06/2009   Method used:   Electronically to  [facility]  [address] (retail)       [address]       [place], [place]  [address]       Ph: [phone] or [phone]       Fax: [phone]   RxID:   [id number] GLIPIZIDE 10 MG TB24 (GLIPIZIDE) two tabs daily  #200 x 4   Entered and Authorized by:   Roderick Pee MD   Signed by:   Roderick Pee MD on 07/06/2009   Method used:   Electronically to        [facility]  [address] [facility]* (retail)       [address]       [place], [place]  [address]       Ph: [phone] or [phone]       Fax: [phone]   RxID:   [id number]    Immunization History:  Influenza Immunization History:    Influenza:  historical (03/06/2009)   Laboratory Results   Urine Tests    Routine Urinalysis   Color: yellow Appearance: Clear Glucose: negative   (Normal Range: Negative) Bilirubin: negative   (Normal Range: Negative) Ketone: negative   (Normal Range: Negative) Spec. Gravity: 1.010   (Normal Range: 1.003-1.035) Blood: 2+   (Normal Range: Negative) pH: 6.0   (Normal Range: 5.0-8.0) Protein: 1+   (Normal Range: Negative) Urobilinogen: 0.2   (Normal Range: 0-1) Nitrite: negative   (Normal Range: Negative) Leukocyte Esterace: negative   (Normal Range: Negative)    Comments: Rita Ohara  July 06, 2009 9:59 AM

## 2010-07-08 NOTE — Assessment & Plan Note (Signed)
Summary: EMP---WILL FAST//CCM   Vital Signs:  Patient profile:   70 year old male Height:      65.75 inches Weight:      176 pounds Temp:     97.8 degrees F oral BP sitting:   130 / 80  (left arm) Cuff size:   regular  Vitals Entered By: Kern Reap CMA Duncan Dull) (June 28, 2010 8:43 AM) CC: wellness exam Is Patient Diabetic? Yes Did you bring your meter with you today? No Pain Assessment Patient in pain? no        Primary Care Provider:  Roderick Pee MD  CC:  wellness exam.  History of Present Illness: [name] is a 70 year old, married male, nonsmoker, who comes in today for a Medicare wellness examination.  He has underlying hypertension, for which he takes a beta blocker to her milligrams daily, diabetes, for which he takes Lantus 31 units nightly, glipizide, 10 mg b.i.d., and aspirin tablet.  He also takes allopurinol 150 mg daily, multivitamins, calcium.  He also takestarka and hectrol from Dr. Algis Downs  his nephrologist..his insurance company when no longer cover these products.  Advised me to back with Dr. Rowe Robert.  He also takes Lasix 160 mg daily.  BP 130/80.  He gets routine eye care.. January 2012....... no retinopathy dental care.  Tetanus 2002, seasonal flu 2011, Pneumovax 2008, shingles 2010.  Review of systems negative except for cough x 1 week.   Here for Medicare AWV:  1.   Risk factors based on Past M, S, F history:...negative, except for above 2.   Physical Activities: walks daily 3.   Depression/mood: good mood.  No depression 4.   Hearing: normal.   5.   ADL's: functions independently 6.   Fall Risk: reviewed not identified 7.   Home Safety: no bands, and a half 8.   Height, weight, &visual acuity:height weight, normal.  Vision normal 9.   Counseling: continue good health habits discussed with nephrologist, change in medication 10.   Labs ordered based on risk factors: done today 11.           Referral Coordination.........none indicated 12.           Care  Plan////////continued good health habits and exercise 13.            Cognitive Assessment .......Marland Kitchenoriented x 3 financially independent  Allergies (verified): No Known Drug Allergies  Past History:  Past medical, surgical, family and social histories (including risk factors) reviewed, and no changes noted (except as noted below).  Past Medical History: Reviewed history from 05/04/2009 and no changes required. Coronary artery disease (out of hospital myocardial infarction in     the 1990's.  This was picked up on an EKG in 1997.  Cardiac     catheterization demonstrated an occluded vessel and collaterals.  I     have no description of this.  A stress perfusion study done 2008      demonstrated an ejection fraction of 53% with inferolateral     hypokinesis.  There was an inferolateral defect with scar and some     mild mixed ischemia.  We managed this medically). Diabetes mellitus, type I Hyperlipidemia Hypertension Peripheral vascular disease Renal insufficiency ED Nephrolithiasis, hx of  Past Surgical History: Reviewed history from 08/16/2007 and no changes required. Lt Humerus Fracture Multiple skin cancers  Family History: Reviewed history from 07/23/2007 and no changes required. father died 40, MI, smoker mother died of an MI 3 brothers, one died,  diabetes, rheumatic fever peptic ulcer disease.  The others had two heart attacks in this diabetic type II one sister died in a fire one died at 20 of diabetes  Social History: Reviewed history from 07/23/2007 and no changes required. Retired Engineer, production Married Former Smoker Alcohol use-yes Alcohol use-no Drug use-no Regular exercise-yes  Review of Systems      See HPI  Physical Exam  General:  Well-developed,well-nourished,in no acute distress; alert,appropriate and cooperative throughout examination Head:  Normocephalic and atraumatic without obvious abnormalities. No apparent alopecia or balding. Eyes:  No corneal  or conjunctival inflammation noted. EOMI. Perrla. Funduscopic exam benign, without hemorrhages, exudates or papilledema. Vision grossly normal. Ears:  External ear exam shows no significant lesions or deformities.  Otoscopic examination reveals clear canals, tympanic membranes are intact bilaterally without bulging, retraction, inflammation or discharge. Hearing is grossly normal bilaterally. Nose:  External nasal examination shows no deformity or inflammation. Nasal mucosa are pink and moist without lesions or exudates. Mouth:  Oral mucosa and oropharynx without lesions or exudates.  Teeth in good repair. Neck:  No deformities, masses, or tenderness noted. Chest Wall:  No deformities, masses, tenderness or gynecomastia noted. Breasts:  No masses or gynecomastia noted Lungs:  Normal respiratory effort, chest expands symmetrically. Lungs are clear to auscultation, no crackles or wheezes. Heart:  Normal rate and regular rhythm. S1 and S2 normal without gallop, murmur, click, rub or other extra sounds. Abdomen:  Bowel sounds positive,abdomen soft and non-tender without masses, organomegaly or hernias noted. Rectal:  No external abnormalities noted. Normal sphincter tone. No rectal masses or tenderness. Genitalia:  Testes bilaterally descended without nodularity, tenderness or masses. No scrotal masses or lesions. No penis lesions or urethral discharge. Prostate:  Prostate gland firm and smooth, no enlargement, nodularity, tenderness, mass, asymmetry or induration. Msk:  No deformity or scoliosis noted of thoracic or lumbar spine.   Pulses:  R and L carotid,radial,femoral,dorsalis pedis and posterior tibial pulses are full and equal bilaterally Extremities:  No clubbing, cyanosis, edema, or deformity noted with normal full range of motion of all joints.   Neurologic:  No cranial nerve deficits noted. Station and gait are normal. Plantar reflexes are down-going bilaterally. DTRs are symmetrical  throughout. Sensory, motor and coordinative functions appear intact. Skin:  Intact without suspicious lesions or rashes Cervical Nodes:  No lymphadenopathy noted Axillary Nodes:  No palpable lymphadenopathy Inguinal Nodes:  No significant adenopathy Psych:  Cognition and judgment appear intact. Alert and cooperative with normal attention span and concentration. No apparent delusions, illusions, hallucinations  Diabetes Management Exam:    Foot Exam (with socks and/or shoes not present):       Sensory-Pinprick/Light touch:          Left medial foot (L-4): normal          Left dorsal foot (L-5): normal          Left lateral foot (S-1): normal          Right medial foot (L-4): normal          Right dorsal foot (L-5): normal          Right lateral foot (S-1): normal       Sensory-Monofilament:          Left foot: normal          Right foot: normal       Inspection:          Left foot: normal  Right foot: normal       Nails:          Left foot: normal          Right foot: normal    Eye Exam:       Eye Exam done elsewhere          Date: 06/09/2010          Results: normal          Done by: bevis   Impression & Recommendations:  Problem # 1:  GOUT, UNSPECIFIED (ICD-274.9) Assessment Improved  The following medications were removed from the medication list:    Allopurinol 300 Mg Tabs (Allopurinol) .Marland Kitchen... Take one tab by mouth once daily His updated medication list for this problem includes:    Allopurinol 300 Mg Tabs (Allopurinol) .Marland Kitchen... Take half tab once daily  Orders: Venipuncture (62694) TLB-BMP (Basic Metabolic Panel-BMET) (80048-METABOL) TLB-CBC Platelet - w/Differential (85025-CBCD) TLB-Hepatic/Liver Function Pnl (80076-HEPATIC) TLB-TSH (Thyroid Stimulating Hormone) (84443-TSH) TLB-A1C / Hgb A1C (Glycohemoglobin) (83036-A1C) TLB-Microalbumin/Creat Ratio, Urine (82043-MALB) TLB-Uric Acid, Blood (84550-URIC) Prescription Created Electronically [phone]) Medicare  -1st Annual Wellness Visit [phone]) Urinalysis-dipstick only (Medicare patient) (09381WE) Specimen Handling (99371)  Problem # 2:  HYPERTENSION (ICD-401.9) Assessment: Improved  His updated medication list for this problem includes:    Metoprolol Succinate 200 Mg Tb24 (Metoprolol succinate) .Marland Kitchen... 1 once daily    Furosemide 80 Mg Tabs (Furosemide) .Marland Kitchen..Marland Kitchen Two tabs a day    Tarka 4-240 Mg Cr-tabs (Trandolapril-verapamil hcl) .Marland Kitchen... Take one tab at bedtime  Orders: Venipuncture (69678) TLB-BMP (Basic Metabolic Panel-BMET) (80048-METABOL) TLB-CBC Platelet - w/Differential (85025-CBCD) TLB-Hepatic/Liver Function Pnl (80076-HEPATIC) TLB-TSH (Thyroid Stimulating Hormone) (84443-TSH) TLB-A1C / Hgb A1C (Glycohemoglobin) (83036-A1C) TLB-Microalbumin/Creat Ratio, Urine (82043-MALB) Prescription Created Electronically [phone]) Medicare -1st Annual Wellness Visit [id number]) Urinalysis-dipstick only (Medicare patient) (25852DP) Specimen Handling (82423)  Problem # 3:  DIABETES MELLITUS, TYPE I (ICD-250.01) Assessment: Improved  His updated medication list for this problem includes:    Glipizide 10 Mg Tb24 (Glipizide) .Marland Kitchen..Marland Kitchen Two tabs daily    Lantus Solostar 100 Unit/ml Soln (Insulin glargine) .Marland Kitchen... 31 u    Aspirin 325 Mg Tabs (Aspirin) .Marland Kitchen... 1 by mouth daily    Tarka 4-240 Mg Cr-tabs (Trandolapril-verapamil hcl) .Marland Kitchen... Take one tab at bedtime  Orders: Venipuncture (53614) TLB-BMP (Basic Metabolic Panel-BMET) (80048-METABOL) TLB-CBC Platelet - w/Differential (85025-CBCD) TLB-Hepatic/Liver Function Pnl (80076-HEPATIC) TLB-TSH (Thyroid Stimulating Hormone) (84443-TSH) TLB-A1C / Hgb A1C (Glycohemoglobin) (83036-A1C) TLB-Microalbumin/Creat Ratio, Urine (82043-MALB) Prescription Created Electronically [phone]) Medicare -1st Annual Wellness Visit [id number]) Urinalysis-dipstick only (Medicare patient) (61950DT) Specimen Handling (26712)  Problem # 4:  HEALTH SCREENING (ICD-V70.0) Assessment:  Unchanged  Orders: Venipuncture (45809) TLB-BMP (Basic Metabolic Panel-BMET) (80048-METABOL) TLB-CBC Platelet - w/Differential (85025-CBCD) TLB-Hepatic/Liver Function Pnl (80076-HEPATIC) TLB-TSH (Thyroid Stimulating Hormone) (84443-TSH) TLB-A1C / Hgb A1C (Glycohemoglobin) (83036-A1C) TLB-Microalbumin/Creat Ratio, Urine (82043-MALB) Prescription Created Electronically [phone]) Medicare -1st Annual Wellness Visit [id number]) Urinalysis-dipstick only (Medicare patient) (97673AL) Specimen Handling (93790)  Complete Medication List: 1)  Glipizide 10 Mg Tb24 (Glipizide) .... Two tabs daily 2)  Metoprolol Succinate 200 Mg Tb24 (Metoprolol succinate) .Marland Kitchen.. 1 once daily 3)  Nitroquick 0.4 Mg Subl (Nitroglycerin) .... As needed 4)  Lantus Solostar 100 Unit/ml Soln (Insulin glargine) .... 31 u 5)  Aspirin 325 Mg Tabs (Aspirin) .Marland Kitchen.. 1 by mouth daily 6)  Furosemide 80 Mg Tabs (Furosemide) .... Two tabs a day 7)  Multi-vitamin 300  .... Once daily 8)  Tarka 4-240 Mg Cr-tabs (Trandolapril-verapamil hcl) .... Take one tab at bedtime 9)  Vitamin C  500 Mg Tabs (Ascorbic acid) .... Once daily 10)  Accu-chek Multiclix Lancets Misc (Lancets) .... Use to check glucose daily 11)  Accu-chek Aviva Strp (Glucose blood) .... Use daily to check glucose 12)  Bd Insulin Syringe Ultrafine 31g X 5/16" 1 Ml Misc (Insulin syringe-needle u-100) .... Use daily for glucose control 13)  Ketoconazole 2 % Crea (Ketoconazole) .... Two times a day 14)  Allopurinol 300 Mg Tabs (Allopurinol) .... Take half tab once daily  Patient Instructions: 1)  soak and a file your toenails, weekly 2)  Please schedule a follow-up appointment in 3 months.Marland Kitchen..250.01 3)  BMP prior to visit, ICD-9: 4)  HbgA1C prior to visit, ICD-9: 5)  Check your blood sugars regularly. If your readings are usually above : or below 70 you should contact our office. 6)  It is important that your Diabetic A1c level is checked every 3 months. 7)  See your eye doctor  yearly to check for diabetic eye damage. 8)  Check your feet each night for sore areas, calluses or signs of infection. 9)  Check your Blood Pressure regularly. If it is above: you should make an appointment. Prescriptions: ALLOPURINOL 300 MG TABS (ALLOPURINOL) take half tab once daily  #50 x 3   Entered and Authorized by:   Roderick Pee MD   Signed by:   Roderick Pee MD on 06/28/2010   Method used:   Electronically to        [facility]  [address] [facility]* (retail)       [address]       [place], [place]  [address]       Ph: [phone] or [phone]       Fax: [phone]   RxID:   [id number] KETOCONAZOLE 2 % CREA (KETOCONAZOLE) two times a day  #60 gr x 1   Entered and Authorized by:   Roderick Pee MD   Signed by:   Roderick Pee MD on 06/28/2010   Method used:   Electronically to        [facility]  [address] [facility]* (retail)       [address]       [place], [place]  [address]       Ph: [phone] or [phone]       Fax: [phone]   RxID:   [id number] BD INSULIN SYRINGE ULTRAFINE 31G X 5/16" 1 ML MISC (INSULIN SYRINGE-NEEDLE U-100) use daily for glucose control  #100 x 3   Entered and Authorized by:   Roderick Pee MD   Signed by:   Roderick Pee MD on 06/28/2010   Method used:   Electronically to        [facility]  [address] [facility]* (retail)       [address]       [place], [place]  [address]       Ph: [phone] or [phone]       Fax: [phone]   RxID:   [id number] ACCU-CHEK AVIVA  STRP (GLUCOSE BLOOD) use daily to check glucose  #90 x prn   Entered and Authorized by:   Roderick Pee MD   Signed by:   Roderick Pee MD on 06/28/2010   Method used:   Electronically to        [facility]  [address]* (retail)       [address]       [place], [place]  [address]       Ph: [phone] or [phone]       Fax: [phone]   RxID:    [id number] ACCU-CHEK MULTICLIX LANCETS  MISC (LANCETS) use to check glucose daily  #100 x prn   Entered and Authorized by:   Roderick Pee MD   Signed by:   Roderick Pee MD on 06/28/2010   Method used:   Electronically to        [facility] [address] [facility]* (retail)       [address]       [place], [place]  [address]       Ph: [phone] or [phone]       Fax: [phone]   RxID:   [id number] FUROSEMIDE 80 MG  TABS (FUROSEMIDE) two tabs a day  #200 x 4   Entered and Authorized by:   Roderick Pee MD   Signed by:   Roderick Pee MD on 06/28/2010   Method used:   Electronically to        [facility]  [address] [facility]* (retail)       [address]       [place], [place]  [address]       Ph: [phone] or [phone]       Fax: [phone]   RxID:   [id number] LANTUS SOLOSTAR 100 UNIT/ML  SOLN (INSULIN GLARGINE) 31 u  #2 x 4   Entered and Authorized by:   Roderick Pee MD   Signed by:   Roderick Pee MD on 06/28/2010   Method used:   Electronically to        [facility]  [address] (retail)       [address]       [place], [place]  [address]       Ph: [phone] or [phone]       Fax: [phone]   RxID:   [id number] NITROQUICK 0.4 MG  SUBL (NITROGLYCERIN) as needed  #25 x 1   Entered and Authorized by:   Roderick Pee MD   Signed by:   Roderick Pee MD on 06/28/2010   Method used:   Electronically to        [facility]  [address]* (retail)       [address]       [place], [place]  [address]       Ph: [phone] or [phone]       Fax: [phone]   RxID:   [id number] METOPROLOL SUCCINATE 200 MG TB24 (METOPROLOL SUCCINATE) 1 once daily  #100 x 4   Entered and Authorized by:   Roderick Pee MD   Signed by:   Roderick Pee MD on 06/28/2010   Method used:   Electronically to        [facility]  [address]  (retail)       [address]       [place], [place]  [address]       Ph:  [phone] or [phone]       Fax: [phone]   RxID:   [id number] GLIPIZIDE 10 MG TB24 (GLIPIZIDE) two tabs daily  #200 x 4   Entered and Authorized by:   Roderick Pee MD   Signed by:   Roderick Pee MD on 06/28/2010   Method used:   Electronically to        [facility]  [address]* (retail)       [address]       [place], [place]  [address]       Ph: [phone] or [phone]       Fax: [phone]   RxID:   [id number]    Orders Added: 1)  Venipuncture [76283] 2)  TLB-BMP (Basic Metabolic Panel-BMET) [80048-METABOL] 3)  TLB-CBC Platelet - w/Differential [85025-CBCD] 4)  TLB-Hepatic/Liver Function Pnl [80076-HEPATIC] 5)  TLB-TSH (Thyroid Stimulating Hormone) [84443-TSH] 6)  TLB-A1C / Hgb A1C (Glycohemoglobin) [83036-A1C] 7)  TLB-Microalbumin/Creat Ratio, Urine [82043-MALB] 8)  TLB-Uric Acid, Blood [84550-URIC] 9)  Prescription Created Electronically [G8553] 10)  Medicare -1st Annual Wellness Visit [G0438] 11)  Urinalysis-dipstick only (Medicare patient) [81003QW] 12)  Specimen Handling [99000]       Laboratory Results   Urine Tests    Routine Urinalysis   Color: yellow Appearance: Clear Glucose: negative   (Normal Range: Negative) Bilirubin: negative   (Normal Range: Negative) Ketone: negative   (Normal Range: Negative) Spec. Gravity: 1.010   (Normal Range: 1.003-1.035) Blood: negative   (Normal Range: Negative) pH: 5.0   (Normal Range: 5.0-8.0) Protein: negative   (Normal Range: Negative) Urobilinogen: 0.2   (Normal Range: 0-1) Nitrite: negative   (Normal Range: Negative) Leukocyte Esterace: negative   (Normal Range: Negative)    Comments: Rita Ohara  June 28, 2010 12:09 PM     Appended Document: Orders Update     Clinical Lists Changes  Orders: Added new Service order of EKG w/ Interpretation  (93000) - Signed Added new Service order of Tdap => 25yrs IM (15176) - Signed Added new Service order of Admin 1st Vaccine (16073) - Signed Observations: Added new observation of TD BOOST VIS: 04/23/08 version given June 28, 2010. (06/28/2010 15:48) Added new observation of TD BOOSTERLO: [id number] (06/28/2010 15:48) Added new observation of TD BOOST EXP: 03/25/2012 (06/28/2010 15:48) Added new observation of TD BOOSTERBY: Kern Reap CMA (AAMA) (06/28/2010 15:48) Added new observation of TD BOOSTERRT: IM (06/28/2010 15:48) Added new observation of TDBOOSTERDSE: 0.5 ml (06/28/2010 15:48) Added new observation of TD BOOSTERMF: GlaxoSmithKline (06/28/2010 15:48) Added new observation of TD BOOST SIT: right deltoid (06/28/2010 15:48) Added new observation of TD BOOSTER: Tdap (06/28/2010 15:48)       Immunizations Administered:  Tetanus Vaccine:    Vaccine Type: Tdap    Site: right deltoid    Mfr: GlaxoSmithKline    Dose: 0.5 ml    Route: IM    Given by: Kern Reap CMA (AAMA)    Exp. Date: 03/25/2012    Lot #: [id number]    VIS given: 04/23/08 version given June 28, 2010.    Physician counseled: yes

## 2010-07-08 NOTE — Miscellaneous (Signed)
Summary: dm eye exam   Clinical Lists Changes  Observations: Added new observation of EYES COMMENT: 12/2010 (12/23/2009 8:43) Added new observation of EYE EXAM BY: dr Vonna Kotyk (12/02/2009 8:44) Added new observation of DMEYEEXMRES: diabetic retinopathy (12/02/2009 8:44) Added new observation of DIAB EYE EX: diabetic retinopathy (12/02/2009 8:44)        Diabetes Management History:      The patient is a 70 years old male who comes in for evaluation of Type 1 Diabetes Mellitus.  He says that he is exercising.    Diabetes Management Exam:    Eye Exam:       Eye Exam done elsewhere          Date: 12/02/2009          Results: diabetic retinopathy          Done by: dr Vonna Kotyk

## 2010-07-08 NOTE — Progress Notes (Signed)
Summary: med not being manufactured anymore  Phone Note Call from Patient Call back at [facility] Phone [phone]   Summary of Call: [employer] has sent letter notifying me that trandol-atril-fera-pamil 40-240mg , tarka, has been removed by the manufacturer.  To contact Dr. Karie Schwalbe for alternative drug.  [place].  NKDA.   Initial call taken by: Rudy Jew, RN,  January 12, 2010 3:35 PM  Follow-up for Phone Call        this was given to him by the nephrologist to have him call nephrology to see what they recommend Follow-up by: Roderick Pee MD,  January 12, 2010 3:52 PM  Additional Follow-up for Phone Call Additional follow up Details #1::        Phone Call Completed Additional Follow-up by: Rudy Jew, RN,  January 12, 2010 4:59 PM

## 2010-07-08 NOTE — Letter (Signed)
Summary: Colonoscopy Letter  [facility]  [address] [place], [place] [address]   Phone: [phone]  Fax: [phone]      March 09, 2010 MRN: [id number]   Douglas Meyer [address] [place], [place]  [address]   Dear [name],   According to your medical record, it is time for you to schedule a Colonoscopy. The American Cancer Society recommends this procedure as a method to detect early colon cancer. Patients with a family history of colon cancer, or a personal history of colon polyps or inflammatory bowel disease are at increased risk.  This letter has been generated based on the recommendations made at the time of your procedure. If you feel that in your particular situation this may no longer apply, please contact our office.  Please call our office at [phone] to schedule this appointment or to update your records at your earliest convenience.  Thank you for cooperating with [place] to provide you with the very best care possible.   Sincerely,  Barbette Hair. Arlyce Dice, M.D.  [facility] [phone]

## 2010-07-08 NOTE — Assessment & Plan Note (Signed)
Summary: 3 month rov/njr   Vital Signs:  Patient profile:   70 year old male Weight:      179 pounds BMI:     29.22 Temp:     98.3 degrees F oral BP sitting:   174 / 90  (left arm) Cuff size:   regular  Vitals Entered By: Kern Reap CMA Duncan Dull) (April 12, 2010 9:05 AM) CC: follow-up visit   Primary Care Provider:  Roderick Pee MD  CC:  follow-up visit.  History of Present Illness: Douglas Meyer is a 70 year old male, who comes in today for follow-up of diabetes, type I.  His blood sugar.  It ranges from 8200 however, his hemoglobin A1c is 7.6.  GFR, unchanged in the past 3 months.  Creatinine also the same at 2.2, ..I'm  reluctant to push his blood sugar lowered just to get his A1c normal.he's due for his annual exam.  The end of the January  Allergies: No Known Drug Allergies  Past History:  Past medical, surgical, family and social histories (including risk factors) reviewed for relevance to current acute and chronic problems.  Past Medical History: Reviewed history from 05/04/2009 and no changes required. Coronary artery disease (out of hospital myocardial infarction in     the 1990's.  This was picked up on an EKG in 1997.  Cardiac     catheterization demonstrated an occluded vessel and collaterals.  I     have no description of this.  A stress perfusion study done 2008      demonstrated an ejection fraction of 53% with inferolateral     hypokinesis.  There was an inferolateral defect with scar and some     mild mixed ischemia.  We managed this medically). Diabetes mellitus, type I Hyperlipidemia Hypertension Peripheral vascular disease Renal insufficiency ED Nephrolithiasis, hx of  Past Surgical History: Reviewed history from 08/16/2007 and no changes required. Lt Humerus Fracture Multiple skin cancers  Family History: Reviewed history from 07/23/2007 and no changes required. father died 42, MI, smoker mother died of an MI 3 brothers, one died, diabetes,  rheumatic fever peptic ulcer disease.  The others had two heart attacks in this diabetic type II one sister died in a fire one died at 60 of diabetes  Social History: Reviewed history from 07/23/2007 and no changes required. Retired Engineer, production Married Former Smoker Alcohol use-yes Alcohol use-no Drug use-no Regular exercise-yes  Review of Systems      See HPI       Flu Vaccine Consent Questions     Do you have a history of severe allergic reactions to this vaccine? no    Any prior history of allergic reactions to egg and/or gelatin? no    Do you have a sensitivity to the preservative Thimersol? no    Do you have a past history of Guillan-Barre Syndrome? no    Do you currently have an acute febrile illness? no    Have you ever had a severe reaction to latex? no    Vaccine information given and explained to patient? yes    Are you currently pregnant? no    Lot Number:[id number]   Exp Date:12/04/2010   Site Given  Left Deltoid IM   Physical Exam  General:  Well-developed,well-nourished,in no acute distress; alert,appropriate and cooperative throughout examination   Impression & Recommendations:  Problem # 1:  DIABETES MELLITUS, TYPE I (ICD-250.01) Assessment Unchanged  His updated medication list for this problem includes:    Glipizide 10 Mg  Tb24 (Glipizide) .Marland Kitchen..Marland Kitchen Two tabs daily    Lantus Solostar 100 Unit/ml Soln (Insulin glargine) .Marland Kitchen... 31 u    Aspirin 325 Mg Tabs (Aspirin) .Marland Kitchen... 1 by mouth daily    Tarka 4-240 Mg Cr-tabs (Trandolapril-verapamil hcl) .Marland Kitchen... Take one tab at bedtime  Complete Medication List: 1)  Glipizide 10 Mg Tb24 (Glipizide) .... Two tabs daily 2)  Metoprolol Succinate 200 Mg Tb24 (Metoprolol succinate) .Marland Kitchen.. 1 once daily 3)  Nitroquick 0.4 Mg Subl (Nitroglycerin) .... As needed 4)  Lantus Solostar 100 Unit/ml Soln (Insulin glargine) .... 31 u 5)  Aspirin 325 Mg Tabs (Aspirin) .Marland Kitchen.. 1 by mouth daily 6)  Furosemide 80 Mg Tabs (Furosemide) .... Two tabs a  day 7)  Multi-vitamin 300  .... Once daily 8)  Tarka 4-240 Mg Cr-tabs (Trandolapril-verapamil hcl) .... Take one tab at bedtime 9)  Hectorol 0.5 Mcg Caps (Doxercalciferol) .... Take three  tabs every morning 10)  Levitra 20 Mg Tabs (Vardenafil hcl) .... Uad 11)  Vitamin C 500 Mg Tabs (Ascorbic acid) .... Once daily 12)  Accu-chek Multiclix Lancets Misc (Lancets) .... Use to check glucose daily 13)  Accu-chek Aviva Strp (Glucose blood) .... Use daily to check glucose 14)  Bd Insulin Syringe Ultrafine 31g X 5/16" 1 Ml Misc (Insulin syringe-needle u-100) .... Use daily for glucose control 15)  Ketoconazole 2 % Crea (Ketoconazole) .... Two times a day 16)  Allopurinol 300 Mg Tabs (Allopurinol) .... Take one tab by mouth once daily  Other Orders: Flu Vaccine 55yrs + MEDICARE PATIENTS (W0981) Administration Flu vaccine - MCR (X9147)  Patient Instructions: 1)  Please schedule a follow-up appointment in 3 months.   Orders Added: 1)  Flu Vaccine 63yrs + MEDICARE PATIENTS [Q2039] 2)  Administration Flu vaccine - MCR [G0008] 3)  Est. Patient Level III [82956]

## 2010-08-05 LAB — HM DIABETES EYE EXAM

## 2010-08-11 ENCOUNTER — Other Ambulatory Visit: Payer: Self-pay | Admitting: Family Medicine

## 2010-09-20 ENCOUNTER — Other Ambulatory Visit (INDEPENDENT_AMBULATORY_CARE_PROVIDER_SITE_OTHER): Payer: Medicare Other | Admitting: Family Medicine

## 2010-09-20 DIAGNOSIS — E109 Type 1 diabetes mellitus without complications: Secondary | ICD-10-CM

## 2010-09-20 LAB — BASIC METABOLIC PANEL
BUN: 45 mg/dL — ABNORMAL HIGH (ref 6–23)
Chloride: 99 mEq/L (ref 96–112)
Glucose, Bld: 156 mg/dL — ABNORMAL HIGH (ref 70–99)
Potassium: 4.3 mEq/L (ref 3.5–5.1)

## 2010-09-27 ENCOUNTER — Encounter: Payer: Self-pay | Admitting: Family Medicine

## 2010-09-27 ENCOUNTER — Ambulatory Visit (INDEPENDENT_AMBULATORY_CARE_PROVIDER_SITE_OTHER): Payer: Medicare Other | Admitting: Family Medicine

## 2010-09-27 DIAGNOSIS — E109 Type 1 diabetes mellitus without complications: Secondary | ICD-10-CM

## 2010-09-27 DIAGNOSIS — N259 Disorder resulting from impaired renal tubular function, unspecified: Secondary | ICD-10-CM

## 2010-09-27 DIAGNOSIS — I10 Essential (primary) hypertension: Secondary | ICD-10-CM

## 2010-09-27 NOTE — Patient Instructions (Signed)
Increase the Lantus to 35 units nightly  Remember to walk daily.  Follow-up in 3 months

## 2010-09-27 NOTE — Progress Notes (Signed)
  Subjective:    Patient ID: Douglas Meyer, male    DOB: [birth year], 70 y.o.   MRN: [id number]  Douglas Meyer  is a 70 year old, married male, nonsmoker Who comes in today for follow-up of type 1 diabetes, hypertension, and renal insufficiency.  His fasting blood sugars have been the 90 range now is 156 however his A1c was 7.3, and now at 7.4, about the same.  He states he is not walking on a daily basis.  However, he is taking his medications.  AP normal 110/70.  Renal insufficiency, secondary to above GFR, stable at 27.    Review of Systems    Other than above, negative Objective:   Physical Exam    Well-developed well-nourished man in no acute distress    Assessment & Plan:  Type 1 diabetes increase the insulin from 31 units daily to 35.  Walk 15 minutes daily.  Follow-up in 3 months.  Number two hypertension.  Continue medications.  Number 3 chronic renal insufficiency.  Continue medications

## 2010-10-19 NOTE — Discharge Summary (Signed)
Douglas Meyer, Douglas Meyer               ACCOUNT NO.:  [id number]   MEDICAL RECORD NO.:  [id number]          PATIENT TYPE:  INP   LOCATION:  1317                         FACILITY:  [facility]   PHYSICIAN:  Sean A. Everardo All, MD    DATE OF BIRTH:  [birth year]   DATE OF ADMISSION:  11/15/2007  DATE OF DISCHARGE:  11/18/2007                               DISCHARGE SUMMARY   REASON FOR ADMISSION:  Vomiting.   HISTORY OF THE PRESENT ILLNESS:  A 70 year old man admitted by Dr. [name]  on November 15, 2007, with vomiting.  Please refer to his history and  physical for details.   HOSPITAL COURSE:  The patient was admitted and given intravenous fluid  and antiemetic therapy.  He steadily felt better and by June 14 was  alert, oriented, ambulatory and eating a regular diet.  He had been  scheduled for a gastric emptying study.  However, he stated he would  really rather go home and see how he felt.  I discussed with him the  fact that he was still on IV metoclopramide.  He stated that he would  rather just have some p.r.n. medication at home and he would like to see  how and he feels.   Regarding his diabetes, he required no glipizide and less Lantus in the  hospital.  In his discharge instructions, he is advised to increase the  insulin back to his usual dosage and then add back the glipizide,  according to his glucose.   He also did not require his antihypertensive medication during his  hospitalization.  This is resumed today, starting with his Toprol-XL.  He is advised to hold off on his Tarka and his Benicar, and these will  be added back as his blood pressure goes back to baseline, as is  anticipated.   Also during his hospitalization, he had acute renal failure with a  creatinine as high as 4.  This had improved to the 1's by the time of  discharge.   He also had a platelet count in the low 100s hospitalization and this  will need a recheck in follow-up if indicated.   He was discharged home on  November 18, 2007, in fair condition but greatly  improved.   DIAGNOSES THE TIME OF DISCHARGE:  1. Vomiting, uncertain etiology, improved.  2. Acute renal failure, probably due to #1, resolved.  3. Thrombocytopenia, improved.  4. Type 2 diabetes.  5. Hypertension.   MEDICATIONS AT THE TIME OF DISCHARGE:  1. Lantus 10 units q.h.s.  2. Zofran ODT 4 mg every 6 hours as needed for nausea.  3. Zocor 80 mg q.h.s.  4. Nitroglycerin 0.4 mg sublingual as needed for chest pain.  5. Toprol-XL 200 mg daily.   For now, he is advised not to take the following:  Benicar, Tarka,  Viagra and Niaspan.   OUTPATIENT FOLLOW-UP:  He will need a BMET and CBC with follow-up if  indicated of any abnormalities.      Sean A. Everardo All, MD  Electronically Signed     SAE/MEDQ  D:  11/18/2007  T:  11/18/2007  Job:  [id number]

## 2010-10-19 NOTE — Assessment & Plan Note (Signed)
[facility]                            CARDIOLOGY OFFICE NOTE   MAJD, TISSUE                        MRN:          [id number]  DATE:03/13/2008                            DOB:          [birth year]    PRIMARY:  Eugenio Hoes. [name], MD   REASON FOR PRESENTATION:  Evaluate the patient with coronary disease.   HISTORY OF PRESENT ILLNESS:  The patient presents for yearly followup.  The patient is 70 years old.  He has done well since I last saw him.  He  has had no new chest discomfort, neck, or arm discomfort.  He has had no  palpitations, presyncope, or syncope.  He is able to work out at [employer] still.  He also volunteers which includes moving furniture.  With  this, he denies any of the above symptoms.  He was hospitalized this  year with nausea, vomiting, and dehydration, acute on chronic renal  insufficiency.  He is followed by Dr. Darrick Penna and reports to me that  his creatinine is around 2.  Dr. Darrick Penna sees him frequently for  management of his hypertension, as well as followup of his renal  function.   PAST MEDICAL HISTORY:  Coronary artery disease (out of hospital,  myocardial infarction in 1s.  This was picked up at an EKG in 1997.  He had a cardiac catheterization which demonstrated an occluded vessel  and collaterals.  He had no description of this.  His stress perfusion  study done last year demonstrated an EF of 55% with inferolateral  hypokinesis.  There was an inferolateral defect with scar and some mild  mixed ischemia.  We have managed this medically), diabetes mellitus,  hypertension, hyperlipidemia, multiple skin cancer surgeries, chronic  renal insufficiency.   ALLERGIES:  None.   MEDICATIONS:  1. Glipizide 10 mg b.i.d..  2. Toprol 200 mg daily.  3. Aspirin 325 mg daily.  4. Vitamin D.  5. Zocor 80 mg daily.  6. Tarka 4/240 daily.  7. Furosemide 80 mg b.i.d.  8. Hectorol.  9. Lantus 29 units nightly.   REVIEW OF  SYSTEMS:  As stated in the HPI and otherwise negative for  other systems.   PHYSICAL EXAMINATION:  GENERAL:  The patient is in no distress.  VITAL SIGNS:  Blood pressure 155/76, heart rate 61 and regular, weight  182 pounds.  NECK:  No jugular venous distention 45 degrees, carotid upstroke brisk  and symmetric, no bruits, no thyromegaly.  LYMPHATICS:  No adenopathy.  LUNGS:  Clear to auscultation bilaterally.  CHEST:  Unremarkable.  HEART:  PMI not displaced or sustained, S1 and S2 within normal limits,  no S3, no S4, no clicks, no rubs, no murmurs.  ABDOMEN:  Flat, positive bowel sounds normal in frequency, and pitch, no  bruits, no rebound, or guarding, midline pulsatile mass, organomegaly.  SKIN:  No rashes or nodules.  EXTREMITIES:  Pulses 2+, no edema.  NEURO:  Grossly intact.   EKG sinus rhythm, rate 59, axis within normals, intervals within  normals, no acute ST-wave changes.   ASSESSMENT AND PLAN:  1. Coronary disease.  The patient is having no new symptoms.  No      further cardiovascular testing is suggested at this point.  He will      continue with risk reduction.  2. Diabetes per Dr. [name].  3. Hypertension.  Blood pressure slightly elevated today, but he has      followed so closely by Dr. Darrick Penna and I will not interfere with      this.  He is having his meds titrated.  4. Dyslipidemia.  This is followed by Dr. [name].  I would suggest that      his goal LDL is less than 70 and HDL greater than 40.  5. Followup.  I will see him back in 1 year or sooner if needed.     Rollene Rotunda, MD, [facility]  Electronically Signed    JH/MedQ  DD: 03/13/2008  DT: 03/14/2008  Job #: [id number]   cc:   Tinnie Gens A. [name], MD

## 2010-10-19 NOTE — Assessment & Plan Note (Signed)
[facility]                            CARDIOLOGY OFFICE NOTE   CYLER, KAPPES                        MRN:          [id number]  DATE:03/20/2007                            DOB:          10-22-1940    PRIMARY CARE PHYSICIAN:  Tinnie Gens A. [name], M.D.   REASON FOR PRESENTATION:  Evaluate patient with coronary disease.   HISTORY OF PRESENT ILLNESS:  The patient is a now 70 year old gentleman.  He has a history of coronary disease as described below. He has actually  done well over the past year.  He is having his lipids and diabetes  followed by Dr. [name].  He is having renal function and blood pressure  followed by Dr. Darrick Penna.  He has been working out at [employer].  He  gets on a bicycle.  He says with a moderate level of exertion he does  not develop any chest discomfort, neck or arm discomfort.  He does not  describe any palpitations, presyncope or syncope.  He does not have any  shortness of breath and denies and PND or orthopnea.  He does have some  very mild leg discomfort when he walks up a hill but none of this is  limiting.   PAST MEDICAL HISTORY:  1. Coronary artery disease (out of hospital myocardial infarction in      the 1990's.  This was picked up on an EKG in 1997.  Cardiac      catheterization demonstrated an occluded vessel and collaterals.  I      have no description of this.  A stress perfusion study done last      year demonstrated an ejection fraction of 53% with inferolateral      hypokinesis.  There was an inferolateral defect with scar and some      mild mixed ischemia.  We managed this medically).  2. Diabetes mellitus.  3. Hypertension.  4. Hyperlipidemia.  5. Multiple skin cancer surgeries.  6. Chronic renal insufficiency (Dr. Darrick Penna reports his creatinine      to be 1.3 most recently).   ALLERGIES:  None.   MEDICATIONS:  1. Benicar 40 mg b.i.d.  2. Glipizide 10 mg b.i.d.  3. Hydrochlorothiazide 25 mg daily.  4. Niaspan 2,000 mg q.h.s.  5. Toprol 200 mg daily.  6. Aspirin 325 mg daily.  7. Multivitamin.  8. Simvastatin 80 mg daily.  9. Tarka 4/240 daily.  10.Lantus.   REVIEW OF SYSTEMS:  As stated in the HPI and otherwise negative for  other systems.   PHYSICAL EXAMINATION:  GENERAL APPEARANCE:  The patient is in no  distress.  VITAL SIGNS:  Blood pressure 150/84, heart rate 72 and regular.  HEENT:  Eyelids unremarkable.  Pupils equal, round, reactive to light.  Fundi not visualized.  Oral mucosa unremarkable.  NECK:  No jugular venous distention at 45 degrees.  Carotid upstrokes  brisk and symmetric, no bruits, no thyromegaly.  LYMPH NODES:  Lymphatics with no cervical, axillary, inguinal  adenopathy.  LUNGS:  Clear to auscultation bilaterally.  BACK:  No costovertebral angle tenderness.  CHEST:  Unremarkable.  HEART:  PMI not displaced or sustained, S1 and S2 within normal limits.  No S3, no S4, no clicks, no rubs, no murmurs.  ABDOMEN:  Flat, positive bowel sounds that are normal in frequency and  pitch.  No bruits.  No rebound. No guarding.  No midline pulsatile  masses.  No hepatomegaly and no splenomegaly.  SKIN:  No rashes, no nodules.  EXTREMITIES:  2+ upper pulses.  2+ femoral's.  Absent dorsalis pedis  pulses bilaterally.  1+ posterior tibialis bilaterally. No cyanosis and  no clubbing.  NEUROLOGICAL:  Oriented to person, place and time.  Cranial nerves II-  XII grossly intact. Motor grossly intact.   LABORATORY DATA:  EKG sinus rhythm, rate 72, axis within normal limits,  early transition lead V2, no acute ST-T wave changes.   ASSESSMENT/PLAN:  1. Coronary disease.  The patient does not have any ongoing anginal      symptoms.  He is being aggressively managed with risk reduction.      At this point no further cardiovascular testing is suggested.  2. Hypertension.  I have discussed this with Dr. Darrick Penna today.  The      patient is to go back and see him in 10 days  for blood pressure      check and will probably need upward adjustment of his medications      as he is not at the goal of 120/70.  However, I will defer to Dr.      Darrick Penna unless requested otherwise.  3. Renal insufficiency.  This seems to be stable and is followed by      Dr. Darrick Penna.  4. Dyslipidemia.  I am going to call Dr. Nelida Meuse office and review the      latest lipid profile.  The goal would be an LDL of less than 70 and      HDL greater than 40.  He is on an aggressive combination regimen.  5. Risk reduction.  I have instructed him to increase his aerobic      activity to five days a week, one-half hour at a time.  He thinks      he can comply with this.  6. Followup.  I will see the patient back in one year or sooner if      needed.     Rollene Rotunda, MD, [facility]  Electronically Signed   JH/MedQ  DD: 03/20/2007  DT: 03/21/2007  Job #: [id number]   cc:   Tinnie Gens A. [name], MD  Llana Aliment. Deterding, M.D.

## 2010-10-19 NOTE — Assessment & Plan Note (Signed)
[facility]                         GASTROENTEROLOGY OFFICE NOTE   Douglas Meyer                        MRN:          [id number]  DATE:03/12/2007                            DOB:          Sep 14, 1940    REASON FOR CONSULTATION:  Colorectal screening.   Douglas Meyer is a pleasant 70 year old white male referred through the  courtesy of Dr.  Tawanna Meyer for evaluation. Screening colonoscopy was  requested. Douglas Meyer only GI complaint is mild constipation. There is  no history of abdominal pain or hematochezia.   PAST MEDICAL HISTORY:  1. Hypertension.  2. Diabetes.  3. He has renal insufficiency.  4. History of kidney stones.  5. History of a silent myocardial infarction.   FAMILY HISTORY:  Pertinent for two brothers with heart disease and four  siblings with diabetes.   CURRENT MEDICATIONS:  1. Benicar.  2. Glipizide.  3. Hydrochlorothiazide.  4. Niaspan.  5. Toprol.  6. Aspirin.  7. Zocor.  8. Tarka.  9. Insulin.   ALLERGIES:  None.   SOCIAL HISTORY:  He does not smoke. He drinks rarely. He is married and  retired.   REVIEW OF SYSTEMS:  Is reviewed and is negative.   PHYSICAL EXAMINATION:  Healthy-appearing male. Pulse 88, blood pressure  160/80, weight 180.  HEENT: EOMI.  PERRLA.  Sclerae are anicteric.  Conjunctivae are pink.  NECK:  Supple without thyromegaly, adenopathy or carotid bruits.  CHEST:  Clear to auscultation and percussion without adventitious  sounds.  CARDIAC:  Regular rhythm; normal S1 S2.  There are no murmurs, gallops  or rubs.  ABDOMEN:  Bowel sounds are normoactive.  Abdomen is soft, nontender and  nondistended.  There are no abdominal masses, tenderness, splenic  enlargement or hepatomegaly.  EXTREMITIES:  Full range of motion.  No cyanosis, clubbing or edema.  RECTAL:  Deferred.   IMPRESSION:  1. Constipation-likely functional.  2. Diabetes.  3. Coronary artery disease.   RECOMMENDATION:   Colonoscopy.     Douglas Meyer. Douglas Dice, MD,FACG  Electronically Signed    RDK/MedQ  DD: 03/12/2007  DT: 03/12/2007  Job #: [id number]   cc:   Douglas Gens A. [name], MD  Douglas Rotunda, MD, [facility]

## 2010-10-22 NOTE — Op Note (Signed)
Douglas Meyer, Douglas Meyer                         ACCOUNT NO.:  [id number]   MEDICAL RECORD NO.:  [id number]                   PATIENT TYPE:  AMB   LOCATION:  DSC                                  FACILITY:  [facility]   PHYSICIAN:  Gita Kudo, M.D.              DATE OF BIRTH:  [birth year]   DATE OF PROCEDURE:  11/07/2003  DATE OF DISCHARGE:                                 OPERATIVE REPORT   OPERATIVE PROCEDURE:  Excision two lesions, right forearm.   SURGEON:  Gita Kudo, M.D.   ANESTHESIA:  MAC - IV sedation, local 1% Xylocaine with epinephrine.   PREOPERATIVE DIAGNOSIS:  Two skin cancers, right arm, proximal recurrent  basal cell carcinoma, distal squamous carcinoma in situ, biopsy proven.   POSTOPERATIVE DIAGNOSIS:  Two skin cancers, right arm, proximal recurrent  basal cell carcinoma, distal squamous carcinoma in situ, biopsy proven.   CLINICAL SUMMARY:  70 year old male with a long history of skin cancers.  I  excised one from his right forearm approximately eight years ago.  The  margins were negative at that time.  However, he has a recurrence just  lateral to that scar.  Distally, he has a biopsy proven squamous cell  carcinoma in situ.  He comes in for wide excision.   OPERATIVE FINDINGS:  I removed the areas with a widely conservative margin.  The defect of the proximal lesion when finished was 5 by 3 cm.  The distal  lesion was 3 by 2 cm.   OPERATIVE PROCEDURE:  Under satisfactory intravenous sedation, the patient  was positioned, prepped and draped in a standard fashion.  Local anesthesia  infiltrated for approximately 25 mL in total.  The lesions were carefully  inspected and skin incisions drawn out with a marking pen.  They were  elliptically oriented in the long axis of the right forearm.  Then, the  lesions were each excised with the measurements as above.  The edges were  marked with suture and then specimen sent separately for pathology.  Then,  each wound  was closed with interrupted 3-0 and 4-0 nylon mattress sutures.  There was some tension as expected because of the incision size, especially  proximally.  Following this, a sterile absorbent compressive dressing was  applied and the patient went to the recovery room from the operating room in  good condition.                                               Gita Kudo, M.D.   MRL/MEDQ  D:  11/07/2003  T:  11/07/2003  Job:  [id number]   cc:   Hope M. Danella Deis, M.D.  [address]  [place]  [place] [address]  Fax: [phone]

## 2010-10-22 NOTE — Assessment & Plan Note (Signed)
[facility]                              CARDIOLOGY OFFICE NOTE   Douglas Meyer, Douglas Meyer                      MRN:          [id number]  DATE:03/23/2006                            DOB:          Apr 12, 1941    PRIMARY CARE PHYSICIAN:  Tinnie Gens A. [name], MD   REASON FOR PRESENTATION:  Evaluate patient with coronary disease.   HISTORY OF PRESENT ILLNESS:  The patient is now 70 years old.  He presents  for followup of his known coronary disease.  He has done well over the past  year, although he has had progressive renal insufficiency.  He is now seeing  Dr. Darrick Penna.  He has had some problems with his blood pressure and reports  this often in the 150s/90s.  He had Tarka added to his regimen.  His  Metformin was discontinued with his renal insufficiency.  He is getting out  and trying to be active.  He does not exercise as much as I would hope;  however, with his activities of daily living, he denies any chest  discomfort, neck discomfort, arm discomfort, activity-induced nausea and  vomiting, excessive diaphoresis.  He has had no palpitations, presyncope, or  syncope.  He denies any PND or orthopnea.   PAST MEDICAL HISTORY:  1. Coronary artery disease (out-of-hospital myocardial infarction in the      1990s).  This is picked up with an EKG in 1997.  He had a cardiac      catheterization with an occluded vessel and collaterals.  I have no      description of this.  His stress perfusion study last year demonstrated      an EF of 53% with inferolateral hypokinesis.  There was an      inferolateral defect with scar and some mild mixed ischemia.  We      managed this medically.  2. Diabetes mellitus for years.  3 . Hypertension for years.  1. Hyperlipidemia for years.  2. Multiple skin cancer surgeries.   ALLERGIES:  None.   MEDICATIONS:  1. Benicar 40 mg b.i.d.  2. Glipizide 10 mg b.i.d.  3. Hydrochlorothiazide 25 mg daily.  4. Niaspan 1000 mg  nightly.  5. Toprol 200 mg daily.  6. Aspirin 225 mg daily.  7. Multivitamin.  8. Zocor 80 mg daily.  9. Tarka 4/240 daily.   REVIEW OF SYSTEMS:  As stated in the HPI, otherwise negative for other  systems.   PHYSICAL EXAMINATION:  GENERAL:  Patient is in no distress.  VITAL SIGNS:  Blood pressure 150s/90s (I repeated and verified this in both  arms, as it was higher, taken by the machine).  Weight 182 pounds.  Body  Mass Index 29.  Heart rate 72 and regular.  HEENT:  Eyelids unremarkable.  Pupils are equal, round and reactive to  light.  Fundi not visualized.  Oral mucosa unremarkable.  NECK:  No jugular venous distention.  Wave form within normal limits.  Carotid upstroke brisk and symmetric.  No bruits, no thyromegaly.  LYMPHATICS:  No cervical, axillary, or inguinal adenopathy.  LUNGS:  Clear to auscultation bilaterally.  BACK:  No costovertebral angle tenderness.  CHEST:  Unremarkable.  HEART:  PMI not displaced or sustained.  S1 and S2 within normal limits.  No  S3, no S4, no clicks, rubs, or murmurs.  ABDOMEN:  Obese, positive bowel sounds.  Normal in frequency and pitch.  No  bruits, rebound, guarding.  There are no midline pulsatile masses.  No  hepatomegaly, splenomegaly.  SKIN:  No rashes, no nodules.  EXTREMITIES:  Pulses 2+ throughout.  No cyanosis, no clubbing, no edema.  NEUROLOGIC:  Alert and oriented to person, place, and time.  Cranial nerves  II-XII grossly intact.  Motor grossly intact throughout.   EKG:  Sinus rhythm, rate 72, axis within normal limits, intervals within  normal limits.  No acute ST-T wave changes.   ASSESSMENT/PLAN:  1. Coronary artery disease:  The patient has coronary artery disease with      silent out-of-hospital myocardial infarction.  He is very high risk      with his ongoing risk factors.  Given his lack of symptoms and his      risk, we have decided on yearly stress perfusion studies as being      indicated.  This has been the  practice when he was with another      cardiology group.  He will have a stress perfusion study next month.      He will continue with aggressive secondary risk reduction.  2. Hypertension:  I have asked him to stop the Benicar, second dose, as      this was clearly a once-a-day drug.  There is no benefit above 40 mg      that I can find; however, he may need better blood pressure control      with his 150s/90s baseline.  I have asked him to keep a blood pressure      diary and present those numbers to me in six weeks.  Most likely, I      would add hydralazine or clonidine.  He will also follow with Dr. [name]      and see Dr. Darrick Penna as well.  3. Dyslipidemia:  The goal would be an LDL of less than 70 and an HDL in      the 40s.  4. Risk reduction:  Again, he understands the need for diet and exercise.      We have discussed this at great length in the past.   FOLLOW UP:  I will see him back in one year or sooner if there are any  significant changes on his stress perfusion study.            ______________________________  Rollene Rotunda, MD, [facility]     JH/MedQ  DD:  03/23/2006  DT:  03/24/2006  Job #:  [id number]   cc:   Dr. Darrick Penna

## 2010-12-21 ENCOUNTER — Other Ambulatory Visit (INDEPENDENT_AMBULATORY_CARE_PROVIDER_SITE_OTHER): Payer: Medicare Other

## 2010-12-21 DIAGNOSIS — E109 Type 1 diabetes mellitus without complications: Secondary | ICD-10-CM

## 2010-12-21 LAB — BASIC METABOLIC PANEL
BUN: 47 mg/dL — ABNORMAL HIGH (ref 6–23)
Calcium: 9.3 mg/dL (ref 8.4–10.5)
GFR: 24.38 mL/min — ABNORMAL LOW (ref >60.00)
Glucose, Bld: 181 mg/dL — ABNORMAL HIGH (ref 70–99)
Sodium: 140 mEq/L (ref 135–145)

## 2010-12-28 ENCOUNTER — Ambulatory Visit: Payer: Medicare Other | Admitting: Family Medicine

## 2010-12-30 ENCOUNTER — Ambulatory Visit (INDEPENDENT_AMBULATORY_CARE_PROVIDER_SITE_OTHER): Payer: Medicare Other | Admitting: Family Medicine

## 2010-12-30 ENCOUNTER — Encounter: Payer: Self-pay | Admitting: Family Medicine

## 2010-12-30 VITALS — BP 120/70 | Temp 98.1°F | Wt 173.0 lb

## 2010-12-30 DIAGNOSIS — E109 Type 1 diabetes mellitus without complications: Secondary | ICD-10-CM

## 2010-12-30 NOTE — Patient Instructions (Signed)
Return in November for your annual exam.  Labs one week prior, but do not fast.  Continue current insulin dose.

## 2010-12-30 NOTE — Progress Notes (Signed)
Addended by: Kern Reap B on: 12/30/2010 01:11 PM   Modules accepted: Orders

## 2010-12-30 NOTE — Progress Notes (Signed)
  Subjective:    Patient ID: Douglas Meyer, male    DOB: 10-14-1940, 70 y.o.   MRN: [id number]  HPI Douglas Meyer is a 70 year old male, who comes in today for follow-up of diabetes, type I.  His blood sugar ranges from 60 to hundred and 60.  His A1c is stable at 7.5%.  Last time we increased his insulin from 30 to 31 units.  Occasional episode of hypoglycemia, which she is able to manage with eating, right away.  Recent nephrology visit this week.  They decreased his beta-blocker to 100 mg daily.     Review of Systems    General and metabolic review of systems otherwise negative Objective:   Physical Exam    Well-developed well-nourished, male, no acute distress    Assessment & Plan:  Diabetes type I.  Continue current therapy.  Follow-up annual exam in November

## 2011-03-03 LAB — CBC
Hemoglobin: 12.8 — ABNORMAL LOW
MCHC: 34.6
MCHC: 34.9
MCHC: 34.9
MCV: 90.1
Platelets: 121 — ABNORMAL LOW
Platelets: 144 — ABNORMAL LOW
RBC: 4.07 — ABNORMAL LOW
RBC: 4.2 — ABNORMAL LOW
RDW: 12.7
RDW: 12.9
WBC: 8.1

## 2011-03-03 LAB — URINE MICROSCOPIC-ADD ON

## 2011-03-03 LAB — URINALYSIS, ROUTINE W REFLEX MICROSCOPIC
Bilirubin Urine: NEGATIVE
Glucose, UA: 250 — AB
Ketones, ur: NEGATIVE
Leukocytes, UA: NEGATIVE
pH: 5

## 2011-03-03 LAB — BASIC METABOLIC PANEL
BUN: 37 — ABNORMAL HIGH
CO2: 24
CO2: 26
Calcium: 8.7
Calcium: 9.2
GFR calc Af Amer: 17 — ABNORMAL LOW
GFR calc non Af Amer: 14 — ABNORMAL LOW
GFR calc non Af Amer: 37 — ABNORMAL LOW
Glucose, Bld: 90
Sodium: 139

## 2011-03-03 LAB — COMPREHENSIVE METABOLIC PANEL
ALT: 19
ALT: 21
AST: 14
AST: 22
Albumin: 3.5
Alkaline Phosphatase: 49
CO2: 21
CO2: 23
Calcium: 8.9
Chloride: 114 — ABNORMAL HIGH
Creatinine, Ser: 5.58 — ABNORMAL HIGH
GFR calc Af Amer: 12 — ABNORMAL LOW
GFR calc non Af Amer: 10 — ABNORMAL LOW
GFR calc non Af Amer: 24 — ABNORMAL LOW
Glucose, Bld: 98
Potassium: 3.7
Sodium: 135
Sodium: 148 — ABNORMAL HIGH
Total Bilirubin: 1.1
Total Protein: 6.4

## 2011-03-03 LAB — HEMOGLOBIN A1C: Hgb A1c MFr Bld: 7.7 — ABNORMAL HIGH

## 2011-03-10 ENCOUNTER — Other Ambulatory Visit: Payer: Self-pay | Admitting: Dermatology

## 2011-06-15 ENCOUNTER — Ambulatory Visit (INDEPENDENT_AMBULATORY_CARE_PROVIDER_SITE_OTHER): Payer: Medicare Other | Admitting: Family

## 2011-06-15 ENCOUNTER — Encounter: Payer: Self-pay | Admitting: Family

## 2011-06-15 VITALS — BP 108/60 | Temp 98.6°F | Wt 166.0 lb

## 2011-06-15 DIAGNOSIS — R05 Cough: Secondary | ICD-10-CM

## 2011-06-15 DIAGNOSIS — J069 Acute upper respiratory infection, unspecified: Secondary | ICD-10-CM

## 2011-06-15 MED ORDER — AMOXICILLIN 500 MG PO TABS: 1000.0000 mg | ORAL_TABLET | Freq: Two times a day (BID) | ORAL | Status: AC

## 2011-06-15 NOTE — Patient Instructions (Signed)
1. Coricidan HBP over the counter. Choose the formula that best suite your symptoms.    Upper Respiratory Infection, Adult An upper respiratory infection (URI) is also sometimes known as the common cold. The upper respiratory tract includes the nose, sinuses, throat, trachea, and bronchi. Bronchi are the airways leading to the lungs. Most people improve within 1 week, but symptoms can last up to 2 weeks. A residual cough may last even longer.  CAUSES Many different viruses can infect the tissues lining the upper respiratory tract. The tissues become irritated and inflamed and often become very moist. Mucus production is also common. A cold is contagious. You can easily spread the virus to others by oral contact. This includes kissing, sharing a glass, coughing, or sneezing. Touching your mouth or nose and then touching a surface, which is then touched by another person, can also spread the virus. SYMPTOMS  Symptoms typically develop 1 to 3 days after you come in contact with a cold virus. Symptoms vary from person to person. They may include:  Runny nose.   Sneezing.   Nasal congestion.   Sinus irritation.   Sore throat.   Loss of voice (laryngitis).   Cough.   Fatigue.   Muscle aches.   Loss of appetite.   Headache.   Low-grade fever.  DIAGNOSIS  You might diagnose your own cold based on familiar symptoms, since most people get a cold 2 to 3 times a year. Your caregiver can confirm this based on your exam. Most importantly, your caregiver can check that your symptoms are not due to another disease such as strep throat, sinusitis, pneumonia, asthma, or epiglottitis. Blood tests, throat tests, and X-rays are not necessary to diagnose a common cold, but they may sometimes be helpful in excluding other more serious diseases. Your caregiver will decide if any further tests are required. RISKS AND COMPLICATIONS  You may be at risk for a more severe case of the common cold if you smoke  cigarettes, have chronic heart disease (such as heart failure) or lung disease (such as asthma), or if you have a weakened immune system. The very young and very old are also at risk for more serious infections. Bacterial sinusitis, middle ear infections, and bacterial pneumonia can complicate the common cold. The common cold can worsen asthma and chronic obstructive pulmonary disease (COPD). Sometimes, these complications can require emergency medical care and may be life-threatening. PREVENTION  The best way to protect against getting a cold is to practice good hygiene. Avoid oral or hand contact with people with cold symptoms. Wash your hands often if contact occurs. There is no clear evidence that vitamin C, vitamin E, echinacea, or exercise reduces the chance of developing a cold. However, it is always recommended to get plenty of rest and practice good nutrition. TREATMENT  Treatment is directed at relieving symptoms. There is no cure. Antibiotics are not effective, because the infection is caused by a virus, not by bacteria. Treatment may include:  Increased fluid intake. Sports drinks offer valuable electrolytes, sugars, and fluids.   Breathing heated mist or steam (vaporizer or shower).   Eating chicken soup or other clear broths, and maintaining good nutrition.   Getting plenty of rest.   Using gargles or lozenges for comfort.   Controlling fevers with ibuprofen or acetaminophen as directed by your caregiver.   Increasing usage of your inhaler if you have asthma.  Zinc gel and zinc lozenges, taken in the first 24 hours of the common cold,  can shorten the duration and lessen the severity of symptoms. Pain medicines may help with fever, muscle aches, and throat pain. A variety of non-prescription medicines are available to treat congestion and runny nose. Your caregiver can make recommendations and may suggest nasal or lung inhalers for other symptoms.  HOME CARE INSTRUCTIONS   Only  take over-the-counter or prescription medicines for pain, discomfort, or fever as directed by your caregiver.   Use a warm mist humidifier or inhale steam from a shower to increase air moisture. This may keep secretions moist and make it easier to breathe.   Drink enough water and fluids to keep your urine clear or pale yellow.   Rest as needed.   Return to work when your temperature has returned to normal or as your caregiver advises. You may need to stay home longer to avoid infecting others. You can also use a face mask and careful hand washing to prevent spread of the virus.  SEEK MEDICAL CARE IF:   After the first few days, you feel you are getting worse rather than better.   You need your caregiver's advice about medicines to control symptoms.   You develop chills, worsening shortness of breath, or brown or red sputum. These may be signs of pneumonia.   You develop yellow or brown nasal discharge or pain in the face, especially when you bend forward. These may be signs of sinusitis.   You develop a fever, swollen neck glands, pain with swallowing, or white areas in the back of your throat. These may be signs of strep throat.  SEEK IMMEDIATE MEDICAL CARE IF:   You have a fever.   You develop severe or persistent headache, ear pain, sinus pain, or chest pain.   You develop wheezing, a prolonged cough, cough up blood, or have a change in your usual mucus (if you have chronic lung disease).   You develop sore muscles or a stiff neck.  Document Released: 11/16/2000 Document Revised: 02/02/2011 Document Reviewed: 09/24/2010 [facility] Patient Information 2012 [place], [place].

## 2011-06-15 NOTE — Progress Notes (Signed)
Subjective:    Patient ID: Douglas Meyer, male    DOB: November 08, 1940, 71 y.o.   MRN: [id number]  HPI A 71 year old white male, nonsmoker, patient of Dr. Jennet Maduro is in today with complaints of sneezing, coughing, congestion has been on it for 2 weeks. Cough is productive with green sputum production. He is to take an over-the-counter, the relief. Symptoms are not improving. He reports sick exposure from his grandchildren over the Christmas holiday.   Review of Systems  Constitutional: Negative.   HENT: Positive for congestion, sneezing and postnasal drip.   Respiratory: Positive for cough.   Cardiovascular: Negative.   Skin: Negative.   Neurological: Negative.   Psychiatric/Behavioral: Negative.    Past Medical History  Diagnosis Date  . CAD (coronary artery disease) 05/15/10    out of hospital myocardial infarction in the 1990's. this was picked up on an  EKG in 1997.  Cardiac catherterization demonstrated an occluded vessel and collateras.  I have no description of this.  A stress  perfusion study  done 2008 demonstrated an ejection fraction  of  53% with inferolateral  hypokinesis.  There was an inferolateral defect with  scar and some mild mixed ischemia.   managed rx  . Diabetes mellitus     DM, type 1  . Hyperlipidemia   . Hypertension   . PVD (peripheral vascular disease)   . Renal insufficiency   . ED (erectile dysfunction)   . Renal insufficiency   . Nephrolithiasis   . Gout   . Polyp of colon     History   Social History  . Marital Status: Married    Spouse Name: N/A    Number of Children: N/A  . Years of Education: N/A   Occupational History  .  [employer]    retired   Social History Main Topics  . Smoking status: Former Games developer  . Smokeless tobacco: Not on file  . Alcohol Use: Yes  . Drug Use: No  . Sexually Active:    Other Topics Concern  . Not on file   Social History Narrative  . No narrative on file    Past Surgical History  Procedure Date  .  Humerus fracture surgery     left  . Mohs surgery     multiple    Family History  Problem Relation Age of Onset  . Heart attack Father     deceased 43  . Diabetes type II Brother   . Diabetes type II Brother   . Diabetes Brother     rheumatic fever, peptic ulcer disease, deceased  . Diabetes Sister     deceased 75    No Known Allergies  Current Outpatient Prescriptions on File Prior to Visit  Medication Sig Dispense Refill  . allopurinol (ZYLOPRIM) 300 MG tablet Take 300 mg by mouth daily.       . Ascorbic Acid (VITAMIN C) 500 MG tablet Take 500 mg by mouth daily.        Marland Kitchen aspirin 325 MG tablet Take 325 mg by mouth daily.        . calcitRIOL (ROCALTROL) 0.5 MCG capsule Take 0.5 mcg by mouth daily.        . furosemide (LASIX) 80 MG tablet Take 80 mg by mouth daily.       Marland Kitchen glipiZIDE (GLUCOTROL) 10 MG tablet Take 10 mg by mouth 2 (two) times daily before a meal.        . glucose blood (ACCU-CHEK AVIVA) test strip  1 each by Other route as directed. Use daily to check glucose      . insulin glargine (LANTUS) 100 UNIT/ML injection Inject 31 Units into the skin.        . Insulin Syringe-Needle U-100 (B-D INS SYR ULTRAFINE 1CC/31G) 31G X 5/16" 1 ML MISC by Does not apply route. Use daily for glucose control       . ketoconazole (NIZORAL) 2 % cream Apply topically 2 (two) times daily.        . Lancets Misc. (ACCU-CHEK MULTICLIX LANCET DEV) KIT by Does not apply route. Use to check glucose daily       . metoprolol (TOPROL-XL) 200 MG 24 hr tablet Take 200 mg by mouth daily. Half tab      . Multiple Vitamins-Minerals (MULTIVITAMIN WITH MINERALS) tablet Take 1 tablet by mouth daily.        . nitroGLYCERIN (NITRO-DUR) 0.4 MG/HR Place 1 patch onto the skin as needed.        . trandolapril (MAVIK) 4 MG tablet Take 4 mg by mouth daily.        . verapamil (VERELAN PM) 240 MG 24 hr capsule Take 240 mg by mouth at bedtime.        . vardenafil (LEVITRA) 20 MG tablet Take 20 mg by mouth. Use as  directed         BP 108/60  Temp(Src) 98.6 F (37 C) (Oral)  Wt 166 lb (75.297 kg)chart    Objective:   Physical Exam  Constitutional: He is oriented to person, place, and time. He appears well-developed.  HENT:  Right Ear: External ear normal.  Left Ear: External ear normal.  Nose: Nose normal.  Mouth/Throat: Oropharynx is clear and moist.  Neck: Normal range of motion. Neck supple.  Cardiovascular: Normal rate, regular rhythm and normal heart sounds.   Pulmonary/Chest: Effort normal and breath sounds normal.  Musculoskeletal: Normal range of motion.  Neurological: He is alert and oriented to person, place, and time.  Skin: Skin is warm and dry.  Psychiatric: He has a normal mood and affect.          Assessment & Plan:  Assessment: Upper respiratory infection  Plan: Amoxicillin 500 mg 2 capsules by mouth twice a day x10 days. Over-the-counter Coricidin HBP as directed. Rest. Fluids. Call the office if symptoms worsen or persist. Recheck a schedule, and when necessary.

## 2011-06-23 ENCOUNTER — Other Ambulatory Visit (INDEPENDENT_AMBULATORY_CARE_PROVIDER_SITE_OTHER): Payer: Medicare Other

## 2011-06-23 DIAGNOSIS — Z0000 Encounter for general adult medical examination without abnormal findings: Secondary | ICD-10-CM

## 2011-06-23 DIAGNOSIS — Z125 Encounter for screening for malignant neoplasm of prostate: Secondary | ICD-10-CM

## 2011-06-23 DIAGNOSIS — Z79899 Other long term (current) drug therapy: Secondary | ICD-10-CM

## 2011-06-23 LAB — CBC WITH DIFFERENTIAL/PLATELET
Basophils Absolute: 0 10*3/uL (ref 0.0–0.1)
Basophils Relative: 0 % (ref 0.0–3.0)
Eosinophils Relative: 1.1 % (ref 0.0–5.0)
HCT: 39.9 % (ref 39.0–52.0)
MCV: 93.4 fl (ref 78.0–100.0)
Monocytes Absolute: 0.6 10*3/uL (ref 0.1–1.0)
Monocytes Relative: 3.9 % (ref 3.0–12.0)
Neutrophils Relative %: 89.7 % — ABNORMAL HIGH (ref 43.0–77.0)
Platelets: 342 10*3/uL (ref 150.0–400.0)
RBC: 4.27 Mil/uL (ref 4.22–5.81)
WBC: 16.1 10*3/uL — ABNORMAL HIGH (ref 4.5–10.5)

## 2011-06-23 LAB — MICROALBUMIN / CREATININE URINE RATIO
Creatinine,U: 79 mg/dL
Microalb Creat Ratio: 13.2 mg/g (ref 0.0–30.0)

## 2011-06-23 LAB — POCT URINALYSIS DIPSTICK
Bilirubin, UA: NEGATIVE
Blood, UA: NEGATIVE
Glucose, UA: NEGATIVE
Spec Grav, UA: 1.01

## 2011-06-23 LAB — BASIC METABOLIC PANEL
BUN: 51 mg/dL — ABNORMAL HIGH (ref 6–23)
Chloride: 101 mEq/L (ref 96–112)
Creatinine, Ser: 2.8 mg/dL — ABNORMAL HIGH (ref 0.4–1.5)
GFR: 24.14 mL/min — ABNORMAL LOW (ref >60.00)
Potassium: 3.9 mEq/L (ref 3.5–5.1)

## 2011-06-23 LAB — HEPATIC FUNCTION PANEL
Albumin: 2.9 g/dL — ABNORMAL LOW (ref 3.5–5.2)
Alkaline Phosphatase: 68 U/L (ref 39–117)

## 2011-06-23 LAB — TSH: TSH: 0.58 u[IU]/mL (ref 0.35–5.50)

## 2011-06-23 LAB — LIPID PANEL
Cholesterol: 150 mg/dL (ref 0–200)
HDL: 26.3 mg/dL — ABNORMAL LOW (ref >39.00)
Triglycerides: 109 mg/dL (ref 0.0–149.0)

## 2011-06-30 ENCOUNTER — Encounter: Payer: Self-pay | Admitting: Family Medicine

## 2011-06-30 ENCOUNTER — Ambulatory Visit (INDEPENDENT_AMBULATORY_CARE_PROVIDER_SITE_OTHER): Payer: Medicare Other | Admitting: Family Medicine

## 2011-06-30 DIAGNOSIS — I219 Acute myocardial infarction, unspecified: Secondary | ICD-10-CM

## 2011-06-30 DIAGNOSIS — I10 Essential (primary) hypertension: Secondary | ICD-10-CM

## 2011-06-30 DIAGNOSIS — N259 Disorder resulting from impaired renal tubular function, unspecified: Secondary | ICD-10-CM

## 2011-06-30 DIAGNOSIS — E785 Hyperlipidemia, unspecified: Secondary | ICD-10-CM

## 2011-06-30 DIAGNOSIS — I739 Peripheral vascular disease, unspecified: Secondary | ICD-10-CM

## 2011-06-30 DIAGNOSIS — M109 Gout, unspecified: Secondary | ICD-10-CM

## 2011-06-30 DIAGNOSIS — E109 Type 1 diabetes mellitus without complications: Secondary | ICD-10-CM

## 2011-06-30 DIAGNOSIS — Z0000 Encounter for general adult medical examination without abnormal findings: Secondary | ICD-10-CM

## 2011-06-30 MED ORDER — METOPROLOL SUCCINATE ER 200 MG PO TB24: 200.0000 mg | ORAL_TABLET | Freq: Every day | ORAL | Status: DC

## 2011-06-30 MED ORDER — ALLOPURINOL 300 MG PO TABS: 300.0000 mg | ORAL_TABLET | Freq: Every day | ORAL | Status: DC

## 2011-06-30 MED ORDER — "INSULIN SYRINGE-NEEDLE U-100 31G X 5/16"" 1 ML MISC": 1.0000 | Status: DC

## 2011-06-30 MED ORDER — VERAPAMIL HCL ER 240 MG PO CP24: 240.0000 mg | ORAL_CAPSULE | Freq: Every day | ORAL | Status: DC

## 2011-06-30 MED ORDER — GLIPIZIDE 10 MG PO TABS: 10.0000 mg | ORAL_TABLET | Freq: Two times a day (BID) | ORAL | Status: DC

## 2011-06-30 MED ORDER — INSULIN GLARGINE 100 UNIT/ML ~~LOC~~ SOLN: 35.0000 [IU] | Freq: Every day | SUBCUTANEOUS | Status: DC

## 2011-06-30 MED ORDER — TRANDOLAPRIL 4 MG PO TABS: 4.0000 mg | ORAL_TABLET | Freq: Every day | ORAL | Status: DC

## 2011-06-30 MED ORDER — FUROSEMIDE 80 MG PO TABS: 80.0000 mg | ORAL_TABLET | Freq: Every day | ORAL | Status: DC

## 2011-06-30 NOTE — Patient Instructions (Signed)
Continue your current medications.  Follow-up in 6 months.  Non-fasting labs one week prior

## 2011-06-30 NOTE — Progress Notes (Signed)
  Subjective:    Patient ID: Douglas Meyer, male    DOB: 1941/03/27, 71 y.o.   MRN: [id number]  HPI Douglas Meyer  Is a 71 year old, married male, nonsmoker, who comes in today for a Medicare wellness examination because of a history of diabetes, type 1, hyperlipidemia, hypertension, gout, and chronic renal insufficiency, and erectile dysfunction.  His medication list was reviewed by Fleet Contras and me and it is accurate, except he's increased his Lantus to 35 units daily.  Blood sugar today was 79 blood sugar last week had dropped to 63.  A1c7 ,,,,,,,,,,,,,, microalbumin area 10.............. He is also followed by nephrology.  GFR stable at 25  He gets routine eye care.  No evidence of diabetic retinopathy, hearing, diminished it referred to [facility] audiology evaluation, regular dental care, colonoscopy 4 years ago, showed 3 polyps.  Tetanus booster 2012, Pneumovax, x 2, seasonal flu shot 2012, shingles 2010  Cognitive function, normal, he walks on a daily basis.  Home health safety reviewed new issues identified, no guns in the house, and he does have a healthcare power of attorney and living well.   Review of Systems  Constitutional: Negative.   HENT: Negative.   Eyes: Negative.   Respiratory: Negative.   Cardiovascular: Negative.   Gastrointestinal: Negative.   Genitourinary: Negative.   Musculoskeletal: Negative.   Skin: Negative.        Mohs surgery to his nose for a basal cell carcinoma  Neurological: Negative.   Hematological: Negative.   Psychiatric/Behavioral: Negative.        Objective:   Physical Exam  Constitutional: He is oriented to person, place, and time. He appears well-developed and well-nourished.  HENT:  Head: Normocephalic and atraumatic.  Right Ear: External ear normal.  Left Ear: External ear normal.  Nose: Nose normal.  Mouth/Throat: Oropharynx is clear and moist.  Eyes: Conjunctivae and EOM are normal. Pupils are equal, round, and reactive to light.  Neck: Normal  range of motion. Neck supple. No JVD present. No tracheal deviation present. No thyromegaly present.  Cardiovascular: Normal rate, regular rhythm, normal heart sounds and intact distal pulses.  Exam reveals no gallop and no friction rub.   No murmur heard. Pulmonary/Chest: Effort normal and breath sounds normal. No stridor. No respiratory distress. He has no wheezes. He has no rales. He exhibits no tenderness.  Abdominal: Soft. Bowel sounds are normal. He exhibits no distension and no mass. There is no tenderness. There is no rebound and no guarding.  Genitourinary: Rectum normal, prostate normal and penis normal. Guaiac negative stool. No penile tenderness.  Musculoskeletal: Normal range of motion. He exhibits no edema and no tenderness.  Lymphadenopathy:    He has no cervical adenopathy.  Neurological: He is alert and oriented to person, place, and time. He has normal reflexes. No cranial nerve deficit. He exhibits normal muscle tone.  Skin: Skin is warm and dry. No rash noted. No erythema. No pallor.  Psychiatric: He has a normal mood and affect. His behavior is normal. Judgment and thought content normal.          Assessment & Plan:  Diabetes type I.  Continue current medication.  Hyperlipidemia.  Continue current medication.  Hypertension.  Continue current medication   History of gout,,,, continue allopurinol 300 mg daily.  Renal insufficiency.  Continue follow-up by nephrologist.  History of skin cancer followed by dermatologist.  Follow-up in one year sooner for any problems.  A1c in 6 months

## 2011-07-05 ENCOUNTER — Other Ambulatory Visit: Payer: Self-pay | Admitting: Dermatology

## 2011-07-13 ENCOUNTER — Other Ambulatory Visit: Payer: Self-pay | Admitting: Family Medicine

## 2011-07-15 ENCOUNTER — Encounter: Payer: Self-pay | Admitting: Family Medicine

## 2011-07-16 ENCOUNTER — Other Ambulatory Visit: Payer: Self-pay | Admitting: Family Medicine

## 2011-07-19 ENCOUNTER — Telehealth: Payer: Self-pay | Admitting: *Deleted

## 2011-07-19 NOTE — Telephone Encounter (Signed)
Patient is not sure if he is supposed to take his Metoprolol half tab daily or one and half tab daily?

## 2011-07-20 NOTE — Telephone Encounter (Signed)
Brachial please call to clarify his medications

## 2011-07-20 NOTE — Telephone Encounter (Signed)
Spoke with wife

## 2011-08-29 ENCOUNTER — Other Ambulatory Visit: Payer: Self-pay | Admitting: Family Medicine

## 2011-09-30 ENCOUNTER — Other Ambulatory Visit: Payer: Self-pay | Admitting: Family Medicine

## 2011-11-22 ENCOUNTER — Other Ambulatory Visit: Payer: Self-pay | Admitting: Family Medicine

## 2011-12-19 ENCOUNTER — Other Ambulatory Visit (INDEPENDENT_AMBULATORY_CARE_PROVIDER_SITE_OTHER): Payer: Medicare Other

## 2011-12-19 DIAGNOSIS — E109 Type 1 diabetes mellitus without complications: Secondary | ICD-10-CM

## 2011-12-19 LAB — BASIC METABOLIC PANEL
BUN: 58 mg/dL — ABNORMAL HIGH (ref 6–23)
Calcium: 10 mg/dL (ref 8.4–10.5)
GFR: 23.23 mL/min — ABNORMAL LOW (ref >60.00)
Potassium: 3.4 mEq/L — ABNORMAL LOW (ref 3.5–5.1)
Sodium: 140 mEq/L (ref 135–145)

## 2011-12-19 LAB — HEMOGLOBIN A1C: Hgb A1c MFr Bld: 7.5 % — ABNORMAL HIGH (ref 4.6–6.5)

## 2011-12-20 LAB — MICROALBUMIN / CREATININE URINE RATIO
Creatinine,U: 86.5 mg/dL
Microalb Creat Ratio: 24 mg/g (ref 0.0–30.0)

## 2011-12-22 ENCOUNTER — Other Ambulatory Visit: Payer: Self-pay | Admitting: Dermatology

## 2011-12-26 ENCOUNTER — Ambulatory Visit: Payer: Medicare Other | Admitting: Family Medicine

## 2012-01-02 ENCOUNTER — Encounter: Payer: Self-pay | Admitting: Family Medicine

## 2012-01-02 ENCOUNTER — Ambulatory Visit (INDEPENDENT_AMBULATORY_CARE_PROVIDER_SITE_OTHER): Payer: Medicare Other | Admitting: Family Medicine

## 2012-01-02 VITALS — BP 130/84 | Temp 98.3°F | Wt 172.0 lb

## 2012-01-02 DIAGNOSIS — N259 Disorder resulting from impaired renal tubular function, unspecified: Secondary | ICD-10-CM

## 2012-01-02 DIAGNOSIS — I10 Essential (primary) hypertension: Secondary | ICD-10-CM

## 2012-01-02 DIAGNOSIS — E109 Type 1 diabetes mellitus without complications: Secondary | ICD-10-CM

## 2012-01-02 NOTE — Progress Notes (Signed)
  Subjective:    Patient ID: Jarquez Mestre, male    DOB: 10/06/1940, 71 y.o.   MRN: [id number]  HPI Atul is a 71 year old male who comes in today for followup of hypertension and diabetes  His blood sugar this morning at home was 87 2 days ago fasting sugar 166 with an A1c of 7.5%. In January was 7.2%. We decreased his Lasix from 80 mg twice a day to 80 mg daily because of declining renal function renal function is stabilized creatinine 2.9 GFR 23. He went to see his nephrologist Dr. Raquel James 2 weeks ago who did not change his medication he agreed with our medication program for now.  BP is 130/84    Review of Systems General and metabolic review of systems otherwise negative    Objective:   Physical Exam Well-developed well-nourished male no acute distress BP right arm sitting position 130/84       Assessment & Plan:  Diabetes type 1 not at goal increase insulin to 40 units daily followup in 3 months  Hypertension at goal continue current therapy  Chronic main renal insufficiency stable continue current program

## 2012-01-02 NOTE — Patient Instructions (Signed)
Continue your current medication except increased increase the insulin to 40 units daily  Followup in 3 months labs one week prior

## 2012-01-13 ENCOUNTER — Other Ambulatory Visit: Payer: Self-pay | Admitting: Family Medicine

## 2012-03-27 ENCOUNTER — Other Ambulatory Visit (INDEPENDENT_AMBULATORY_CARE_PROVIDER_SITE_OTHER): Payer: Medicare Other

## 2012-03-27 DIAGNOSIS — E109 Type 1 diabetes mellitus without complications: Secondary | ICD-10-CM

## 2012-03-27 LAB — MICROALBUMIN / CREATININE URINE RATIO
Creatinine,U: 58 mg/dL
Microalb Creat Ratio: 26.5 mg/g (ref 0.0–30.0)
Microalb, Ur: 15.4 mg/dL — ABNORMAL HIGH (ref 0.0–1.9)

## 2012-03-27 LAB — BASIC METABOLIC PANEL
Calcium: 9.8 mg/dL (ref 8.4–10.5)
GFR: 24.19 mL/min — ABNORMAL LOW (ref >60.00)
Glucose, Bld: 51 mg/dL — ABNORMAL LOW (ref 70–99)
Potassium: 3.8 mEq/L (ref 3.5–5.1)
Sodium: 142 mEq/L (ref 135–145)

## 2012-03-27 LAB — HEMOGLOBIN A1C: Hgb A1c MFr Bld: 7.4 % — ABNORMAL HIGH (ref 4.6–6.5)

## 2012-04-03 ENCOUNTER — Encounter: Payer: Self-pay | Admitting: Family Medicine

## 2012-04-03 ENCOUNTER — Ambulatory Visit (INDEPENDENT_AMBULATORY_CARE_PROVIDER_SITE_OTHER): Payer: Medicare Other | Admitting: Family Medicine

## 2012-04-03 VITALS — BP 160/90 | Wt 170.0 lb

## 2012-04-03 DIAGNOSIS — N259 Disorder resulting from impaired renal tubular function, unspecified: Secondary | ICD-10-CM

## 2012-04-03 DIAGNOSIS — I10 Essential (primary) hypertension: Secondary | ICD-10-CM

## 2012-04-03 DIAGNOSIS — E109 Type 1 diabetes mellitus without complications: Secondary | ICD-10-CM

## 2012-04-03 NOTE — Patient Instructions (Signed)
Continue your current medications for your blood sugar  Increase the Mavik to 6 mg daily  Followup the fourth week in January for your annual exam  Nonfasting labs one week prior

## 2012-04-03 NOTE — Progress Notes (Signed)
  Subjective:    Patient ID: Douglas Meyer, male    DOB: [birth year], 71 y.o.   MRN: [id number]  HPI Douglas Meyer is a 71 year old married male nonsmoker who comes in today for followup of diabetes type 1, hypertension, renal insufficiency, he states over the past 3 months he has felt well. His blood sugars sometimes drop in the 60s in the morning but eats something right away and he comes back up.  BP at home is running 160-170 systolic diastolic 90. He's on Mavik 4 mg daily.  His renal insufficiency is stable GFR at 24 BUN 58 creatinine 2.8 which is unchanged in 3 months ago. Hemoglobin A1c 7.4%.  Weight steady at 170 pounds    Review of Systems    general metabolic cardiovascular view systems otherwise negative Objective:   Physical Exam Well-developed well-nourished male in no acute distress BP right arm sitting position 170/90 pulse was 70 and regular       Assessment & Plan:  Diabetes type 1 and goal continue current therapy  Renal insufficiency stable  Hypertension slightly higher than I like you to see one increase the ACE inhibitor to 6 mg daily followup CPX in 3 months

## 2012-06-26 ENCOUNTER — Other Ambulatory Visit (INDEPENDENT_AMBULATORY_CARE_PROVIDER_SITE_OTHER): Payer: Medicare Other

## 2012-06-26 DIAGNOSIS — N259 Disorder resulting from impaired renal tubular function, unspecified: Secondary | ICD-10-CM

## 2012-06-26 DIAGNOSIS — I10 Essential (primary) hypertension: Secondary | ICD-10-CM

## 2012-06-26 DIAGNOSIS — E109 Type 1 diabetes mellitus without complications: Secondary | ICD-10-CM

## 2012-06-26 LAB — POCT URINALYSIS DIPSTICK
Bilirubin, UA: NEGATIVE
Glucose, UA: NEGATIVE
Leukocytes, UA: NEGATIVE
Nitrite, UA: NEGATIVE

## 2012-06-26 LAB — LDL CHOLESTEROL, DIRECT: Direct LDL: 90 mg/dL

## 2012-06-26 LAB — HEPATIC FUNCTION PANEL
ALT: 16 U/L (ref 0–53)
Albumin: 3.5 g/dL (ref 3.5–5.2)
Total Bilirubin: 0.9 mg/dL (ref 0.3–1.2)
Total Protein: 6.1 g/dL (ref 6.0–8.3)

## 2012-06-26 LAB — LIPID PANEL
HDL: 31 mg/dL — ABNORMAL LOW (ref >39.00)
Triglycerides: 290 mg/dL — ABNORMAL HIGH (ref 0.0–149.0)

## 2012-06-26 LAB — CBC WITH DIFFERENTIAL/PLATELET
Basophils Relative: 0.8 % (ref 0.0–3.0)
Eosinophils Absolute: 0.5 10*3/uL (ref 0.0–0.7)
Eosinophils Relative: 6.4 % — ABNORMAL HIGH (ref 0.0–5.0)
Lymphocytes Relative: 25.9 % (ref 12.0–46.0)
Monocytes Relative: 7.6 % (ref 3.0–12.0)
Neutrophils Relative %: 59.3 % (ref 43.0–77.0)
RBC: 4.34 Mil/uL (ref 4.22–5.81)
WBC: 7.5 10*3/uL (ref 4.5–10.5)

## 2012-06-26 LAB — TSH: TSH: 2.23 u[IU]/mL (ref 0.35–5.50)

## 2012-06-26 LAB — BASIC METABOLIC PANEL
CO2: 27 mEq/L (ref 19–32)
Calcium: 9.3 mg/dL (ref 8.4–10.5)
Chloride: 103 mEq/L (ref 96–112)
Sodium: 138 mEq/L (ref 135–145)

## 2012-06-26 LAB — MICROALBUMIN / CREATININE URINE RATIO: Microalb Creat Ratio: 18.9 mg/g (ref 0.0–30.0)

## 2012-07-03 ENCOUNTER — Ambulatory Visit (INDEPENDENT_AMBULATORY_CARE_PROVIDER_SITE_OTHER): Payer: Medicare Other | Admitting: Family Medicine

## 2012-07-03 ENCOUNTER — Encounter: Payer: Self-pay | Admitting: Family Medicine

## 2012-07-03 VITALS — BP 140/80 | Temp 98.4°F | Ht 66.5 in | Wt 169.0 lb

## 2012-07-03 DIAGNOSIS — I219 Acute myocardial infarction, unspecified: Secondary | ICD-10-CM

## 2012-07-03 DIAGNOSIS — I739 Peripheral vascular disease, unspecified: Secondary | ICD-10-CM

## 2012-07-03 DIAGNOSIS — N259 Disorder resulting from impaired renal tubular function, unspecified: Secondary | ICD-10-CM

## 2012-07-03 DIAGNOSIS — E785 Hyperlipidemia, unspecified: Secondary | ICD-10-CM

## 2012-07-03 DIAGNOSIS — I10 Essential (primary) hypertension: Secondary | ICD-10-CM

## 2012-07-03 DIAGNOSIS — I2510 Atherosclerotic heart disease of native coronary artery without angina pectoris: Secondary | ICD-10-CM

## 2012-07-03 DIAGNOSIS — Z0000 Encounter for general adult medical examination without abnormal findings: Secondary | ICD-10-CM

## 2012-07-03 DIAGNOSIS — E109 Type 1 diabetes mellitus without complications: Secondary | ICD-10-CM

## 2012-07-03 DIAGNOSIS — M109 Gout, unspecified: Secondary | ICD-10-CM

## 2012-07-03 DIAGNOSIS — F528 Other sexual dysfunction not due to a substance or known physiological condition: Secondary | ICD-10-CM

## 2012-07-03 MED ORDER — NITROGLYCERIN 0.4 MG/HR TD PT24: 1.0000 | MEDICATED_PATCH | TRANSDERMAL | Status: DC | PRN

## 2012-07-03 MED ORDER — TRANDOLAPRIL 4 MG PO TABS: ORAL_TABLET | ORAL | Status: DC

## 2012-07-03 MED ORDER — METOPROLOL SUCCINATE ER 200 MG PO TB24: ORAL_TABLET | ORAL | Status: DC

## 2012-07-03 MED ORDER — ALLOPURINOL 300 MG PO TABS: 300.0000 mg | ORAL_TABLET | Freq: Every day | ORAL | Status: DC

## 2012-07-03 MED ORDER — INSULIN GLARGINE 100 UNIT/ML ~~LOC~~ SOLN: 38.0000 [IU] | Freq: Every day | SUBCUTANEOUS | Status: DC

## 2012-07-03 MED ORDER — GLIPIZIDE 10 MG PO TABS: ORAL_TABLET | ORAL | Status: DC

## 2012-07-03 NOTE — Patient Instructions (Signed)
Continue your current medications  Followup in 3 months for your diabetes  Nonfasting lab work 1 week prior

## 2012-07-03 NOTE — Progress Notes (Signed)
Subjective:    Patient ID: Douglas Marker Sr., male    DOB: 1940-08-02, 72 y.o.   MRN: [id number]  HPI Douglas Meyer  is a 72 year old male married nonsmoker who comes in today for a Medicare wellness examination because of a history of gout, hypertension, diabetes type 1, coronary disease, renal insufficiency  His medical problems reviewed and have been stable.  His medication reviewed and they are the same except his Mavik was increased to 6 mg daily, verapamil was discontinued, Toprol was cut to 100 mg daily. The rest of his med list reviewed in its correct  Vaccinations up-to-date  He states on occasion he'll feel lightheaded when he stands up. He states that his nephrologist Dr. Raquel James did supine sitting upright ....... BP drop 20 mm upon standing. However he says it only happens occasionally usually in the morning.  He gets routine eye care, dental care, recent colonoscopy showed no recurrence of polyps,  Cognitive function normal he walks on a daily basis home health safety reviewed no issues identified, no guns in the house, he does have a health care power of attorney and living well   Review of Systems  Constitutional: Negative.   HENT: Negative.   Eyes: Negative.   Respiratory: Negative.   Cardiovascular: Negative.   Gastrointestinal: Negative.   Genitourinary: Negative.   Musculoskeletal: Negative.   Skin: Negative.   Neurological: Negative.   Hematological: Negative.   Psychiatric/Behavioral: Negative.        Objective:   Physical Exam  Constitutional: He is oriented to person, place, and time. He appears well-developed and well-nourished.  HENT:  Head: Normocephalic and atraumatic.  Right Ear: External ear normal.  Left Ear: External ear normal.  Nose: Nose normal.  Mouth/Throat: Oropharynx is clear and moist.  Eyes: Conjunctivae normal and EOM are normal. Pupils are equal, round, and reactive to light.  Neck: Normal range of motion. Neck supple. No JVD present. No  tracheal deviation present. No thyromegaly present.  Cardiovascular: Normal rate, regular rhythm, normal heart sounds and intact distal pulses.  Exam reveals no gallop and no friction rub.   No murmur heard.      No carotid bruits, aorta normal, peripheral pulses 1+ but diminished  Pulmonary/Chest: Effort normal and breath sounds normal. No stridor. No respiratory distress. He has no wheezes. He has no rales. He exhibits no tenderness.  Abdominal: Soft. Bowel sounds are normal. He exhibits no distension and no mass. There is no tenderness. There is no rebound and no guarding.  Genitourinary: Rectum normal, prostate normal and penis normal. Guaiac negative stool. No penile tenderness.  Musculoskeletal: Normal range of motion. He exhibits no edema and no tenderness.  Lymphadenopathy:    He has no cervical adenopathy.  Neurological: He is alert and oriented to person, place, and time. He has normal reflexes. No cranial nerve deficit. He exhibits normal muscle tone.  Skin: Skin is warm and dry. No rash noted. No erythema. No pallor.       Total body skin exam normal scars from previous Mohs surgery. Very thick toenails advised to have those filed  Psychiatric: He has a normal mood and affect. His behavior is normal. Judgment and thought content normal.          Assessment & Plan:  Hypertension goal continue current therapy  Diabetes type 1 at goal continue current therapy recent A1c was 7.4%.  Renal insufficiency continue current medications and renal followup  Coronary disease asymptomatic  Followup in 6 months sooner if  any problems

## 2012-07-04 ENCOUNTER — Other Ambulatory Visit: Payer: Self-pay | Admitting: *Deleted

## 2012-08-29 ENCOUNTER — Encounter (HOSPITAL_COMMUNITY): Payer: Self-pay | Admitting: *Deleted

## 2012-08-29 ENCOUNTER — Emergency Department (HOSPITAL_COMMUNITY)
Admission: EM | Admit: 2012-08-29 | Discharge: 2012-08-29 | Disposition: A | Discharge status: Home / Self Care | Payer: Medicare Other | Attending: Emergency Medicine | Admitting: Emergency Medicine

## 2012-08-29 DIAGNOSIS — E1169 Type 2 diabetes mellitus with other specified complication: Secondary | ICD-10-CM | POA: Insufficient documentation

## 2012-08-29 DIAGNOSIS — Z862 Personal history of diseases of the blood and blood-forming organs and certain disorders involving the immune mechanism: Secondary | ICD-10-CM | POA: Insufficient documentation

## 2012-08-29 DIAGNOSIS — Z8679 Personal history of other diseases of the circulatory system: Secondary | ICD-10-CM | POA: Insufficient documentation

## 2012-08-29 DIAGNOSIS — Z7982 Long term (current) use of aspirin: Secondary | ICD-10-CM | POA: Insufficient documentation

## 2012-08-29 DIAGNOSIS — Z9861 Coronary angioplasty status: Secondary | ICD-10-CM | POA: Insufficient documentation

## 2012-08-29 DIAGNOSIS — Z794 Long term (current) use of insulin: Secondary | ICD-10-CM | POA: Insufficient documentation

## 2012-08-29 DIAGNOSIS — I2510 Atherosclerotic heart disease of native coronary artery without angina pectoris: Secondary | ICD-10-CM | POA: Insufficient documentation

## 2012-08-29 DIAGNOSIS — M109 Gout, unspecified: Secondary | ICD-10-CM | POA: Insufficient documentation

## 2012-08-29 DIAGNOSIS — E162 Hypoglycemia, unspecified: Secondary | ICD-10-CM

## 2012-08-29 DIAGNOSIS — Z87891 Personal history of nicotine dependence: Secondary | ICD-10-CM | POA: Insufficient documentation

## 2012-08-29 DIAGNOSIS — Z860100 Personal history of colon polyps, unspecified: Secondary | ICD-10-CM | POA: Insufficient documentation

## 2012-08-29 DIAGNOSIS — Z8639 Personal history of other endocrine, nutritional and metabolic disease: Secondary | ICD-10-CM | POA: Insufficient documentation

## 2012-08-29 DIAGNOSIS — Z87448 Personal history of other diseases of urinary system: Secondary | ICD-10-CM | POA: Insufficient documentation

## 2012-08-29 DIAGNOSIS — Z87442 Personal history of urinary calculi: Secondary | ICD-10-CM | POA: Insufficient documentation

## 2012-08-29 DIAGNOSIS — Z79899 Other long term (current) drug therapy: Secondary | ICD-10-CM | POA: Insufficient documentation

## 2012-08-29 DIAGNOSIS — I10 Essential (primary) hypertension: Secondary | ICD-10-CM | POA: Insufficient documentation

## 2012-08-29 LAB — COMPREHENSIVE METABOLIC PANEL
ALT: 18 U/L (ref 0–53)
AST: 21 U/L (ref 0–37)
Albumin: 4 g/dL (ref 3.5–5.2)
Calcium: 9.6 mg/dL (ref 8.4–10.5)
Sodium: 141 mEq/L (ref 135–145)
Total Protein: 7.2 g/dL (ref 6.0–8.3)

## 2012-08-29 LAB — GLUCOSE, CAPILLARY

## 2012-08-29 LAB — URINALYSIS, ROUTINE W REFLEX MICROSCOPIC
Bilirubin Urine: NEGATIVE
Hgb urine dipstick: NEGATIVE
Specific Gravity, Urine: 1.014 (ref 1.005–1.030)
pH: 5 (ref 5.0–8.0)

## 2012-08-29 LAB — CBC
HCT: 41.6 % (ref 39.0–52.0)
Hemoglobin: 14 g/dL (ref 13.0–17.0)
MCH: 30.9 pg (ref 26.0–34.0)
MCHC: 33.7 g/dL (ref 30.0–36.0)
MCV: 91.8 fL (ref 78.0–100.0)

## 2012-08-29 LAB — URINE MICROSCOPIC-ADD ON
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
## 2012-08-29 NOTE — ED Notes (Signed)
Patient resting on stretcher at this time. Patient denies any complaints. resp are even and unlabored, no acute distress. Family at bedside. Call light on stretcher. Will continue to monitor.

## 2012-08-29 NOTE — ED Notes (Signed)
Patient given discharge instructions for low blood sugar. No rx was provided. Advised to follow up with primary care or to return to this department if condition worsens. Patient voiced understanding of all instructions and had no further questions. Patient ambulated to front lobby without difficulty.

## 2012-08-29 NOTE — ED Notes (Signed)
Patient resting on stretcher at this time. No acute distress noted, resp are even and unlabored. Patient denies any complaints at this time, no pain. Family member at bedside. Call light on bed. Will continue to monitor
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
## 2012-09-04 ENCOUNTER — Encounter: Payer: Self-pay | Admitting: Internal Medicine

## 2012-09-04 ENCOUNTER — Ambulatory Visit: Payer: Medicare Other | Admitting: Family

## 2012-09-04 ENCOUNTER — Ambulatory Visit: Payer: Medicare Other | Admitting: Internal Medicine

## 2012-09-04 ENCOUNTER — Ambulatory Visit (INDEPENDENT_AMBULATORY_CARE_PROVIDER_SITE_OTHER): Payer: Medicare Other | Admitting: Internal Medicine

## 2012-09-04 VITALS — BP 142/90 | HR 66 | Temp 98.3°F | Resp 18 | Wt 169.0 lb

## 2012-09-04 DIAGNOSIS — E1069 Type 1 diabetes mellitus with other specified complication: Secondary | ICD-10-CM

## 2012-09-04 DIAGNOSIS — N259 Disorder resulting from impaired renal tubular function, unspecified: Secondary | ICD-10-CM

## 2012-09-04 DIAGNOSIS — N189 Chronic kidney disease, unspecified: Secondary | ICD-10-CM

## 2012-09-04 DIAGNOSIS — E109 Type 1 diabetes mellitus without complications: Secondary | ICD-10-CM

## 2012-09-04 MED ORDER — INSULIN GLARGINE 100 UNIT/ML ~~LOC~~ SOLN: 34.0000 [IU] | Freq: Every day | SUBCUTANEOUS | Status: DC

## 2012-09-04 NOTE — Patient Instructions (Addendum)
Discontinue Glucotrol (glipizide)  Decrease Lantus to 34 units at bedtime

## 2012-09-04 NOTE — Progress Notes (Signed)
Subjective:    Patient ID: Douglas Marker Sr., male    DOB: 06-Oct-1940, 72 y.o.   MRN: [id number]  HPI  72 year old patient who has a history chronic kidney disease as well as diabetes. He was evaluated last week at the emergency department the 2 severe symptomatic hypoglycemia with a documented blood sugar as low as 33. Medical regimen includes basal insulin 38 units of Lantus at bedtime as well as glipizide 10 mg twice a day. The patient does describe occasional hypoglycemia usually in the early morning hours  Past Medical History  Diagnosis Date  . CAD (coronary artery disease) 05/15/10    out of hospital myocardial infarction in the 1990's. this was picked up on an  EKG in 1997.  Cardiac catherterization demonstrated an occluded vessel and collateras.  I have no description of this.  A stress  perfusion study  done 2008 demonstrated an ejection fraction  of  53% with inferolateral  hypokinesis.  There was an inferolateral defect with  scar and some mild mixed ischemia.   managed rx  . Diabetes mellitus     DM, type 1  . Hyperlipidemia   . Hypertension   . PVD (peripheral vascular disease)   . Renal insufficiency   . ED (erectile dysfunction)   . Renal insufficiency   . Nephrolithiasis   . Gout   . Polyp of colon     History   Social History  . Marital Status: Married    Spouse Name: N/A    Number of Children: N/A  . Years of Education: N/A   Occupational History  .  [employer]    retired   Social History Main Topics  . Smoking status: Former Games developer  . Smokeless tobacco: Not on file  . Alcohol Use: Yes  . Drug Use: No  . Sexually Active:    Other Topics Concern  . Not on file   Social History Narrative  . No narrative on file    Past Surgical History  Procedure Laterality Date  . Humerus fracture surgery      left  . Mohs surgery      multiple    Family History  Problem Relation Age of Onset  . Heart attack Father     deceased 67  . Diabetes type II  Brother   . Diabetes type II Brother   . Diabetes Brother     rheumatic fever, peptic ulcer disease, deceased  . Diabetes Sister     deceased 32    No Known Allergies  Current Outpatient Prescriptions on File Prior to Visit  Medication Sig Dispense Refill  . allopurinol (ZYLOPRIM) 300 MG tablet Take 300 mg by mouth daily.      . Ascorbic Acid (VITAMIN C) 500 MG tablet Take 500 mg by mouth daily.        Marland Kitchen aspirin 325 MG tablet Take 325 mg by mouth daily.        . calcitRIOL (ROCALTROL) 0.5 MCG capsule Take 0.5 mcg by mouth daily.        . furosemide (LASIX) 80 MG tablet Take 80 mg by mouth daily.       . metoprolol (TOPROL-XL) 200 MG 24 hr tablet Take 100 mg by mouth daily. Half tab daily      . Multiple Vitamins-Minerals (MULTIVITAMIN WITH MINERALS) tablet Take 1 tablet by mouth daily.        . nitroGLYCERIN (NITRODUR - DOSED IN MG/24 HR) 0.4 mg/hr Place 1 patch onto the  skin as needed.      . trandolapril (MAVIK) 4 MG tablet Take 6 mg by mouth daily. 1 1/2 tab daily       No current facility-administered medications on file prior to visit.    BP 142/90  Pulse 66  Temp(Src) 98.3 F (36.8 C) (Oral)  Resp 18  Wt 169 lb (76.658 kg)  BMI 26.87 kg/m2  SpO2 99%       Review of Systems  Constitutional: Negative for fever, chills, appetite change and fatigue.  HENT: Negative for hearing loss, ear pain, congestion, sore throat, trouble swallowing, neck stiffness, dental problem, voice change and tinnitus.   Eyes: Negative for pain, discharge and visual disturbance.  Respiratory: Negative for cough, chest tightness, wheezing and stridor.   Cardiovascular: Negative for chest pain, palpitations and leg swelling.  Gastrointestinal: Negative for nausea, vomiting, abdominal pain, diarrhea, constipation, blood in stool and abdominal distention.  Genitourinary: Negative for urgency, hematuria, flank pain, discharge, difficulty urinating and genital sores.  Musculoskeletal: Negative for  myalgias, back pain, joint swelling, arthralgias and gait problem.  Skin: Negative for rash.  Neurological: Negative for dizziness, syncope, speech difficulty, weakness, numbness and headaches.  Hematological: Negative for adenopathy. Does not bruise/bleed easily.  Psychiatric/Behavioral: Positive for confusion and decreased concentration. Negative for behavioral problems and dysphoric mood. The patient is not nervous/anxious.        Objective:   Physical Exam  Constitutional: He appears well-developed and well-nourished. No distress.  Repeat blood pressure 130/80          Assessment & Plan:   Symptomatic hypoglycemia. We'll discontinue glipizide and down titrate Lantus slightly to 34 units. The patient does have an appointment later this month. We'll reassess diabetic control at that time. Patient has been asked to call the office if fasting blood sugars are consistently greater than 200 Chronic kidney disease

## 2012-09-25 ENCOUNTER — Other Ambulatory Visit (INDEPENDENT_AMBULATORY_CARE_PROVIDER_SITE_OTHER): Payer: Medicare Other

## 2012-09-25 ENCOUNTER — Telehealth: Payer: Self-pay

## 2012-09-25 DIAGNOSIS — E109 Type 1 diabetes mellitus without complications: Secondary | ICD-10-CM

## 2012-09-25 LAB — BASIC METABOLIC PANEL
BUN: 85 mg/dL (ref 6–23)
CO2: 25 mEq/L (ref 19–32)
Calcium: 9.6 mg/dL (ref 8.4–10.5)
GFR: 15.38 mL/min — ABNORMAL LOW (ref >60.00)
Glucose, Bld: 228 mg/dL — ABNORMAL HIGH (ref 70–99)
Potassium: 4.7 mEq/L (ref 3.5–5.1)

## 2012-09-25 NOTE — Telephone Encounter (Signed)
Dr [name] made aware, pt has chronic renal failure.  He will discuss with pt when he comes in on 10/02/12

## 2012-09-25 NOTE — Telephone Encounter (Signed)
[facility] called to report critical lab values for BUN 85 and creatinine 4.08.

## 2012-10-01 ENCOUNTER — Encounter: Payer: Self-pay | Admitting: Cardiology

## 2012-10-01 ENCOUNTER — Ambulatory Visit (INDEPENDENT_AMBULATORY_CARE_PROVIDER_SITE_OTHER): Payer: Medicare Other | Admitting: Cardiology

## 2012-10-01 VITALS — BP 160/72 | HR 60 | Ht 62.0 in | Wt 169.0 lb

## 2012-10-01 DIAGNOSIS — E109 Type 1 diabetes mellitus without complications: Secondary | ICD-10-CM

## 2012-10-01 DIAGNOSIS — I739 Peripheral vascular disease, unspecified: Secondary | ICD-10-CM

## 2012-10-01 DIAGNOSIS — I10 Essential (primary) hypertension: Secondary | ICD-10-CM

## 2012-10-01 NOTE — Patient Instructions (Addendum)
The current medical regimen is effective;  continue present plan and medications.  Your physician has requested that you have an exercise tolerance test. For further information please visit [email or web address] Please also follow instruction sheet, as given.  Please keep a blood pressure diary taking your blood pressure after sitting, randomly during the day three to four times a week.  Follow up will be based on the results of the stress testing.

## 2012-10-01 NOTE — Progress Notes (Signed)
HPI The patient presents for follow up of CAD.  I saw him many years ago. He had a previous out of hospital myocardial infarction. His last stress test was 2006 with old inferior infarct with an EF of 53%. Previous cardiac catheterization was reported to describe an occluded chronic vessel with collaterals but I never had a copy of this. He is being referred back as it has been several years since his visit and he is to have cataract surgery. He does have renal insufficiency with a progressively elevated creatinine most recently above 4. He is being followed actively by nephrology.  Since I last saw him he has remained quite active.  He works for a [employer].  The patient denies any new symptoms such as chest discomfort, neck or arm discomfort. There has been no new shortness of breath, PND or orthopnea. There have been no reported palpitations, presyncope or syncope.  He's had none of the chest discomfort that was his myocardial infarction back in the 1990s. (Of note that we have the symptoms in the 90s the catheterization was 1997 for followup of an abnormal EKG.)   No Known Allergies  Current Outpatient Prescriptions  Medication Sig Dispense Refill  . allopurinol (ZYLOPRIM) 300 MG tablet Take 300 mg by mouth daily.      . Ascorbic Acid (VITAMIN C) 500 MG tablet Take 500 mg by mouth daily.        Marland Kitchen aspirin 325 MG tablet Take 325 mg by mouth daily.        . calcitRIOL (ROCALTROL) 0.5 MCG capsule Take 0.5 mcg by mouth daily.        . furosemide (LASIX) 80 MG tablet Take 80 mg by mouth daily.       . insulin glargine (LANTUS) 100 UNIT/ML injection Inject 36 Units into the skin at bedtime.      . metoprolol (TOPROL-XL) 200 MG 24 hr tablet Take 100 mg by mouth daily. Half tab daily      . Multiple Vitamins-Minerals (MULTIVITAMIN WITH MINERALS) tablet Take 1 tablet by mouth daily.        . nitroGLYCERIN (NITRODUR - DOSED IN MG/24 HR) 0.4 mg/hr Place 1 patch onto the skin as needed.       . trandolapril (MAVIK) 4 MG tablet Take 6 mg by mouth daily. 1 1/2 tab daily       No current facility-administered medications for this visit.    Past Medical History  Diagnosis Date  . CAD (coronary artery disease) 05/15/10    out of hospital myocardial infarction in the 1990's. this was picked up on an  EKG in 1997.  Cardiac catherterization demonstrated an occluded vessel and collateras.  I have no description of this.  A stress  perfusion study  done 2008 demonstrated an ejection fraction  of  53% with inferolateral  hypokinesis.  There was an inferolateral defect with  scar and some mild mixed ischemia.   managed rx  . Diabetes mellitus     DM, type 1  . Hyperlipidemia   . Hypertension   . PVD (peripheral vascular disease)   . Renal insufficiency   . ED (erectile dysfunction)   . Renal insufficiency   . Nephrolithiasis   . Gout   . Polyp of colon     Past Surgical History  Procedure Laterality Date  . Humerus fracture surgery      left  . Mohs surgery      multiple  Family History  Problem Relation Age of Onset  . Heart attack Father     deceased 34  . Diabetes type II Brother   . Diabetes type II Brother   . Diabetes Brother     rheumatic fever, peptic ulcer disease, deceased  . Diabetes Sister     deceased 72    History   Social History  . Marital Status: Married    Spouse Name: N/A    Number of Children: N/A  . Years of Education: N/A   Occupational History  .  [employer]    retired   Social History Main Topics  . Smoking status: Former Games developer  . Smokeless tobacco: Not on file  . Alcohol Use: Yes  . Drug Use: No  . Sexually Active:    Other Topics Concern  . Not on file   Social History Narrative  . No narrative on file    ROS:  As stated in the HPI and negative for all other systems.  PHYSICAL EXAM BP 160/72  Pulse 60  Ht 5\' 2"  (1.575 m)  Wt 169 lb (76.658 kg)  BMI 30.9 kg/m2 GENERAL:  Well appearing HEENT:  Pupils equal  round and reactive, fundi not visualized, oral mucosa unremarkable NECK:  No jugular venous distention, waveform within normal limits, carotid upstroke brisk and symmetric, no bruits, no thyromegaly LYMPHATICS:  No cervical, inguinal adenopathy LUNGS:  Clear to auscultation bilaterally BACK:  No CVA tenderness CHEST:  Unremarkable HEART:  PMI not displaced or sustained,S1 and S2 within normal limits, no S3, no S4, no clicks, no rubs, no murmurs ABD:  Flat, positive bowel sounds normal in frequency in pitch, no bruits, no rebound, no guarding, no midline pulsatile mass, no hepatomegaly, no splenomegaly EXT:  2 plus pulses throughout, no edema, no cyanosis no clubbing SKIN:  No rashes no nodules NEURO:  Cranial nerves II through XII grossly intact, motor grossly intact throughout PSYCH:  Cognitively intact, oriented to person place and time  EKG:  08/29/12.  Sinus rhythm, rate 77, old inferior MI with probable posterior involvement, no acute ST-T wave changes.  ASSESSMENT AND PLAN   CAD:   I will bring the patient back for a POET (Plain Old Exercise Test). This will allow me to screen for obstructive coronary disease, risk stratify and very importantly provide a prescription for exercise.  PREOP:  The patient will be at acceptable risk for the planned cataract surgeries. These are low risk procedures and he has a high functional status. He has no symptoms.  HTN:  His blood pressure is slightly elevated but he reports that it's typically about 140 at home. I will not change his medications but he should keep a blood pressure diary.

## 2012-10-02 ENCOUNTER — Encounter: Payer: Self-pay | Admitting: Family Medicine

## 2012-10-02 ENCOUNTER — Ambulatory Visit (INDEPENDENT_AMBULATORY_CARE_PROVIDER_SITE_OTHER): Payer: Medicare Other | Admitting: Family Medicine

## 2012-10-02 VITALS — BP 140/80 | Temp 97.8°F | Wt 168.0 lb

## 2012-10-02 DIAGNOSIS — N259 Disorder resulting from impaired renal tubular function, unspecified: Secondary | ICD-10-CM

## 2012-10-02 DIAGNOSIS — E109 Type 1 diabetes mellitus without complications: Secondary | ICD-10-CM

## 2012-10-02 DIAGNOSIS — I10 Essential (primary) hypertension: Secondary | ICD-10-CM

## 2012-10-02 MED ORDER — FUROSEMIDE 20 MG PO TABS: 20.0000 mg | ORAL_TABLET | Freq: Every day | ORAL | Status: DC

## 2012-10-02 NOTE — Patient Instructions (Addendum)
Continue the glipizide one half tab daily in the morning  Decrease the insulin to 30 units daily  Check a fasting blood sugar daily in the morning  Hold the Lasix for 2 days then restart on Friday ,,,,,,,,,,,,,,,, 20 mg daily  Followup in one week with me

## 2012-10-02 NOTE — Progress Notes (Signed)
  Subjective:    Patient ID: Douglas Marker Sr., male    DOB: 1941/02/24, 72 y.o.   MRN: [id number]  HPI Douglas Meyer is a 72 year old male who comes in today for evaluation of diabetes type 1, hypertension, and renal insufficiency  A couple weeks ago his glipizide was discontinued by Dr. Kirtland Bouchard. However his blood sugar shot up so he restarted it at 5 mg daily in the morning. He's currently on Lantus 36 units at bedtime however he 7 episodes of low blood sugar this morning it dropped to 61 and he was symptomatic  Labs one week ago showed a deterioration in his renal function. His BUN has climbed 85 creatinine went from 3.2-4.1. GFR dropped from 22-15. He's not do to go back and see nephrology Dr. Raquel Meyer until June   Review of Systems    review of systems otherwise negative Objective:   Physical Exam  Well-developed thin male no acute distress BP right arm sitting position 140/80 pulse 70 and regular      Assessment & Plan:  Diabetes type 1,,,,,,,,,, blood sugar too low continue glipizide 5 mg daily decrease Lantus to 30 units daily  Deterioration of renal function hold Lasix for 2 days restart it 20 mg daily followup in one week  Hypertension at goal continue current therapy

## 2012-10-03 ENCOUNTER — Telehealth: Payer: Self-pay | Admitting: Cardiology

## 2012-10-03 NOTE — Telephone Encounter (Signed)
New problem    Status of cardiac clearance .  

## 2012-10-03 NOTE — Telephone Encounter (Signed)
Pt aware he has been given clearance for surgery.  Clearance will be faxed 5/1.  GXT is for the OK to exercise.

## 2012-10-03 NOTE — Telephone Encounter (Signed)
Follow up   Pt calling concerning message that was given to [facility] center, because he was under the impression that Dr Antoine Poche was going to ok him to have surgery before his stress test. Please call pt.

## 2012-10-03 NOTE — Telephone Encounter (Signed)
Spoke with Marcelino Duster at [facility] who is checking status of surgical clearance.  I informed her that the patient must have a stress test on 5/12 before Dr. Antoine Poche can clear him.  Marcelino Duster states that she will notify the patient.

## 2012-10-05 ENCOUNTER — Telehealth: Payer: Self-pay | Admitting: Cardiology

## 2012-10-05 NOTE — Telephone Encounter (Signed)
Received request from Nurse.  OV Note From 4/28 faxed to :  To: [employer]  Fax number: [phone] Attention: 10/05/12/KM

## 2012-10-09 ENCOUNTER — Ambulatory Visit (INDEPENDENT_AMBULATORY_CARE_PROVIDER_SITE_OTHER): Payer: Medicare Other | Admitting: Family Medicine

## 2012-10-09 ENCOUNTER — Encounter: Payer: Self-pay | Admitting: Family Medicine

## 2012-10-09 VITALS — BP 150/80 | Temp 98.0°F | Wt 170.0 lb

## 2012-10-09 DIAGNOSIS — B351 Tinea unguium: Secondary | ICD-10-CM | POA: Insufficient documentation

## 2012-10-09 DIAGNOSIS — N259 Disorder resulting from impaired renal tubular function, unspecified: Secondary | ICD-10-CM

## 2012-10-09 DIAGNOSIS — E109 Type 1 diabetes mellitus without complications: Secondary | ICD-10-CM

## 2012-10-09 DIAGNOSIS — I10 Essential (primary) hypertension: Secondary | ICD-10-CM

## 2012-10-09 LAB — BASIC METABOLIC PANEL
CO2: 26 mEq/L (ref 19–32)
Calcium: 9.4 mg/dL (ref 8.4–10.5)
Chloride: 103 mEq/L (ref 96–112)
Glucose, Bld: 184 mg/dL — ABNORMAL HIGH (ref 70–99)
Sodium: 138 mEq/L (ref 135–145)

## 2012-10-09 NOTE — Progress Notes (Signed)
  Subjective:    Patient ID: Douglas Marker Sr., male    DOB: 1941/05/28, 72 y.o.   MRN: [id number]  HPI  Douglas Meyer is a 72 year old male nonsmoker who comes in today for followup of diabetes type 1, renal insufficiency, and bilateral onychomycosis  We decreased his Lasix from 80 mg a day the 20 because of declining renal function. We also decreased his Glucotrol to 5 mg daily and decrease his insulin to 32 units daily because he was having episodes of hypoglycemia. Blood sugars dropping in the 60s. He comes back today his blood sugar now is stable around 90 averaging.  BP 150/80  Weight stable 170  He does have bilateral onychomycosis of all 10 toenails. This poses a significant risk since he has underlying diabetes. I will turn his toenails today  Review of Systems    review of systems otherwise negative Objective:   Physical Exam Well-developed well-nourished male no acute distress vital signs stable blood sugar averaging around 90 with change in medication  [place] toes were involved with severe fungal disease. After alcohol prep the nails were trimmed.       Assessment & Plan:  Diabetes type 1 at goal continue current therapy  Renal insufficiency check labs  Onychomycosis all 10 toenails trimmed

## 2012-10-09 NOTE — Patient Instructions (Signed)
Continue your current medications  I will call you this afternoon to discuss your lab work and any medication changes  Removed the Band-Aids tonight and begin soaking her toes twice daily and warm water for one week  Have the nail [facility] file your toenails down weekly

## 2012-10-15 ENCOUNTER — Encounter: Payer: Medicare Other | Admitting: Physician Assistant

## 2012-10-16 ENCOUNTER — Ambulatory Visit (INDEPENDENT_AMBULATORY_CARE_PROVIDER_SITE_OTHER): Payer: Medicare Other | Admitting: Nurse Practitioner

## 2012-10-16 ENCOUNTER — Other Ambulatory Visit: Payer: Self-pay | Admitting: Nurse Practitioner

## 2012-10-16 DIAGNOSIS — R0609 Other forms of dyspnea: Secondary | ICD-10-CM

## 2012-10-16 DIAGNOSIS — I2510 Atherosclerotic heart disease of native coronary artery without angina pectoris: Secondary | ICD-10-CM

## 2012-10-16 DIAGNOSIS — I739 Peripheral vascular disease, unspecified: Secondary | ICD-10-CM

## 2012-10-16 DIAGNOSIS — I10 Essential (primary) hypertension: Secondary | ICD-10-CM

## 2012-10-16 NOTE — Progress Notes (Signed)
Exercise Treadmill Test  Pre-Exercise Testing Evaluation Rhythm: normal sinus  Rate: 65     Test  Exercise Tolerance Test Ordering MD: Angelina Sheriff, MD  Interpreting MD: Ward Givens, NP  Unique Test No: 1  Treadmill:  1  Indication for ETT: cad  Contraindication to ETT: No   Stress Modality: exercise - treadmill  Cardiac Imaging Performed: non   Protocol: standard Bruce - maximal  Max BP:  174/73  Max MPHR (bpm): 148 85% MPR (bpm): 126  MPHR obtained (bpm):  118 % MPHR obtained:  79  Reached 85% MPHR (min:sec):  7:13 Total Exercise Time (min-sec):  7:13  Workload in METS:  8.1 Borg Scale: 15  Reason ETT Terminated:  fatigue    ST Segment Analysis At Rest: normal ST segments - no evidence of significant ST depression With Exercise: significant ischemic ST depression  Other Information Arrhythmia:  No Angina during ETT:  absent (0) Quality of ETT:  diagnostic  ETT Interpretation:  abnormal - evidence of ST depression consistent with ischemia  Comments: With exercise, pt c/o dyspnea and leg fatigue, which resulted in cessation of the study prior to achieving 85% of max predicted HR. Max HR 118. Beginning during stage 2 and persisting throughout recovery, he had 1-3 mm flat to downsloping ST depressionin I, II, III, aVF, V2-V5. At no point did he have chest pain.  Recommendations: I discussed case with Dr. Antoine Poche.  As pt has no h/o chest pain @ home and has good fxnl status @ home, I have arranged for an outpt lexiscan myoview to further eval for ischemia.

## 2012-10-18 ENCOUNTER — Ambulatory Visit (HOSPITAL_COMMUNITY): Payer: Medicare Other | Attending: Cardiology | Admitting: Radiology

## 2012-10-18 VITALS — BP 175/85 | Ht 62.0 in | Wt 167.0 lb

## 2012-10-18 DIAGNOSIS — I2510 Atherosclerotic heart disease of native coronary artery without angina pectoris: Secondary | ICD-10-CM

## 2012-10-18 DIAGNOSIS — E109 Type 1 diabetes mellitus without complications: Secondary | ICD-10-CM | POA: Insufficient documentation

## 2012-10-18 DIAGNOSIS — I219 Acute myocardial infarction, unspecified: Secondary | ICD-10-CM

## 2012-10-18 DIAGNOSIS — R0989 Other specified symptoms and signs involving the circulatory and respiratory systems: Secondary | ICD-10-CM | POA: Insufficient documentation

## 2012-10-18 DIAGNOSIS — I739 Peripheral vascular disease, unspecified: Secondary | ICD-10-CM | POA: Insufficient documentation

## 2012-10-18 DIAGNOSIS — R0609 Other forms of dyspnea: Secondary | ICD-10-CM | POA: Insufficient documentation

## 2012-10-18 DIAGNOSIS — E785 Hyperlipidemia, unspecified: Secondary | ICD-10-CM | POA: Insufficient documentation

## 2012-10-18 DIAGNOSIS — Z794 Long term (current) use of insulin: Secondary | ICD-10-CM | POA: Insufficient documentation

## 2012-10-18 DIAGNOSIS — R5381 Other malaise: Secondary | ICD-10-CM | POA: Insufficient documentation

## 2012-10-18 DIAGNOSIS — I10 Essential (primary) hypertension: Secondary | ICD-10-CM | POA: Insufficient documentation

## 2012-10-18 DIAGNOSIS — R9439 Abnormal result of other cardiovascular function study: Secondary | ICD-10-CM

## 2012-10-18 DIAGNOSIS — I252 Old myocardial infarction: Secondary | ICD-10-CM | POA: Insufficient documentation

## 2012-10-18 DIAGNOSIS — R0602 Shortness of breath: Secondary | ICD-10-CM

## 2012-10-18 MED ORDER — TECHNETIUM TC 99M SESTAMIBI GENERIC - CARDIOLITE
10.0000 | Freq: Once | INTRAVENOUS | Status: AC | PRN
  Administered 2012-10-18: 10 via INTRAVENOUS

## 2012-10-18 MED ORDER — REGADENOSON 0.4 MG/5ML IV SOLN
0.4000 mg | Freq: Once | INTRAVENOUS | Status: AC
  Administered 2012-10-18: 0.4 mg via INTRAVENOUS

## 2012-10-18 MED ORDER — TECHNETIUM TC 99M SESTAMIBI GENERIC - CARDIOLITE
30.0000 | Freq: Once | INTRAVENOUS | Status: AC | PRN
  Administered 2012-10-18: 30 via INTRAVENOUS

## 2012-10-18 NOTE — Progress Notes (Signed)
[facility] SITE 3 NUCLEAR MED [address] [place], [place] [address] [phone]    Cardiology Nuclear Med Study  Douglas Elena Sr. is a 72 y.o. male     MRN : [id number]     DOB: 1941/06/03  Procedure Date: 10/18/2012  Nuclear Med Background Indication for Stress Test:  Evaluation for Ischemia History:  10/16/12 Abnormal GXT, 2006 MPS EF 53% Abnormal 1990's MI Cardiac Risk Factors: Family History - CAD, Hypertension, IDDM Type 1, Lipids and PVD  Symptoms:  DOE and Fatigue   Nuclear Pre-Procedure Caffeine/Decaff Intake:  None NPO After: 7:00am   Lungs:  clear O2 Sat: 98% on room air. IV 0.9% NS with Angio Cath:  20g  IV Site: R Antecubital  IV Started by:  Stanton Kidney, EMT-P  Chest Size (in):  40 Cup Size: n/a  Height: 5\' 2"  (1.575 m)  Weight:  167 lb (75.751 kg)  BMI:  Body mass index is 30.54 kg/(m^2). Tech Comments:  Meds were taken as directed    Nuclear Med Study 1 or 2 day study: 1 day  Stress Test Type:  Lexiscan  Reading MD: Willa Rough, MD  Order Authorizing Provider:  Rollene Rotunda, MD  Resting Radionuclide: Technetium 91m Sestamibi  Resting Radionuclide Dose: 11.0 mCi   Stress Radionuclide:  Technetium 26m Sestamibi  Stress Radionuclide Dose: 33.0 mCi           Stress Protocol Rest HR: 56 Stress HR: 67  Rest BP: 175/85 Stress BP: 156/76  Exercise Time (min): n/a METS: n/a   Predicted Max HR: 148 bpm % Max HR: 45.27 bpm Rate Pressure Product: 52841   Dose of Adenosine (mg):  n/a Dose of Lexiscan: 0.4 mg  Dose of Atropine (mg): n/a Dose of Dobutamine: n/a mcg/kg/min (at max HR)  Stress Test Technologist: Bonnita Levan, RN  Nuclear Technologist:  Domenic Polite, CNMT     Rest Procedure:  Myocardial perfusion imaging was performed at rest 45 minutes following the intravenous administration of Technetium 57m Sestamibi. Rest ECG: Normal sinus rhythm. Probable old inferior MI.  Stress Procedure:  The patient received IV Lexiscan 0.4 mg over  15-seconds.  Technetium 21m Sestamibi injected at 30-seconds.  Quantitative spect images were obtained after a 45 minute delay. Stress ECG: No significant change from baseline ECG  QPS Raw Data Images:  Patient motion noted; appropriate software correction applied. Stress Images:  There is a moderate size area with moderate decreased uptake affecting the base and mid inferior wall and the base and mid inferolateral wall. There is also a very small area of mild decreased uptake at the apical cap. Rest Images:  There is a moderate size area with moderate decreased uptake affecting the base and mid inferior wall and the base and mid inferolateral wall. Subtraction (SDS):  There is slight ischemia at the apical cap. Transient Ischemic Dilatation (Normal <1.22):  0.95 Lung/Heart Ratio (Normal <0.45):  0.25  Quantitative Gated Spect Images QGS EDV:  109 ml QGS ESV:  52 ml  Impression Exercise Capacity:  Lexiscan with no exercise. BP Response:  Normal blood pressure response. Clinical Symptoms:  No significant symptoms noted. ECG Impression:  No significant ST segment change suggestive of ischemia. Comparison with Prior Nuclear Study:  Compared to the study of November, 2006, there is no significant change.  Overall Impression:  This study is unchanged from the prior study of 2006. There is moderate scar affecting/the base/mid inferior wall and the base/mid inferolateral wall. There may be very  slight peri-infarct ischemia in this area. There is also a very small area of ischemia at the apical cap. This was also seen in the past.  LV Ejection Fraction: 53%.  LV Wall Motion:  There is mild inferobasilar hypokinesis.  Willa Rough, MD

## 2012-10-27 ENCOUNTER — Other Ambulatory Visit: Payer: Self-pay | Admitting: Family Medicine

## 2012-10-30 ENCOUNTER — Ambulatory Visit (INDEPENDENT_AMBULATORY_CARE_PROVIDER_SITE_OTHER): Payer: Medicare Other | Admitting: Physician Assistant

## 2012-10-30 ENCOUNTER — Encounter: Payer: Self-pay | Admitting: Physician Assistant

## 2012-10-30 VITALS — BP 142/78 | HR 66 | Ht 66.0 in | Wt 168.8 lb

## 2012-10-30 DIAGNOSIS — E785 Hyperlipidemia, unspecified: Secondary | ICD-10-CM

## 2012-10-30 DIAGNOSIS — I2510 Atherosclerotic heart disease of native coronary artery without angina pectoris: Secondary | ICD-10-CM

## 2012-10-30 DIAGNOSIS — N189 Chronic kidney disease, unspecified: Secondary | ICD-10-CM

## 2012-10-30 MED ORDER — ATORVASTATIN CALCIUM 20 MG PO TABS: 20.0000 mg | ORAL_TABLET | Freq: Every day | ORAL | Status: DC

## 2012-10-30 NOTE — Progress Notes (Signed)
[address] [place], [place]  [address] Phone: [phone] Fax:  [phone]  Date:  10/30/2012   ID:  Douglas Marker Sr., DOB April 17, 1941, MRN [id number]  PCP:  Evette Georges, MD  Cardiologist:  Dr. Rollene Rotunda     History of Present Illness: Douglas Gervasi Sr. is a 72 y.o. male who returns for f/u.  He has a hx of CAD, DM2, HTN, HL,CKD, PAD.  Prior cardiac cath reportedly showed CTO (unkown vessel) with collaterals.  No record of cath report available.  Patient saw Dr. Rollene Rotunda recently for surgical clearance for upcoming cataract surgery.  He was set up for ETT. ETT 10/16/12: patient exercised 7:13. He did not achieve 85% PMHR.  He had ST depression in 1, 2, 3, aVF, V2-5.  Lexiscan myoview 10/18/12: EF 53%, inf and inf-lat scar, slight peri-infarct ischemia, small area of apical ischemia; no change from 2006.  Study reviewed by Dr. Rollene Rotunda and felt to be low risk.  No further workup suggested.  Patient remains very active.  He delivers furniture several times a week.  The patient denies chest pain, shortness of breath, syncope, orthopnea, PND or significant pedal edema.   Labs (4/14):  K 4.7, Cr 4.1 Labs (5/14):  K 4.3, Cr 2.8  Wt Readings from Last 3 Encounters:  10/30/12 168 lb 12.8 oz (76.567 kg)  10/18/12 167 lb (75.751 kg)  10/09/12 170 lb (77.111 kg)     Past Medical History  Diagnosis Date  . CAD (coronary artery disease) 05/15/10    out of hospital myocardial infarction in the 1990's. this was picked up on an  EKG in 1997.  Cardiac catherterization demonstrated an occluded vessel and collateras.  I have no description of this.  A stress  perfusion study  done 2008 demonstrated an ejection fraction  of  53% with inferolateral  hypokinesis.  There was an inferolateral defect with  scar and some mild mixed ischemia.   managed rx  . Diabetes mellitus     DM, type 1  . Hyperlipidemia   . Hypertension   . PVD (peripheral vascular disease)     . ED (erectile dysfunction)   . CKD (chronic kidney disease), stage IV   . Nephrolithiasis   . Gout   . Polyp of colon   . Hx of cardiovascular stress test     a. Lexiscan myoview 10/18/12: EF 53%, inf and inf-lat scar, slight peri-infarct ischemia, small area of apical ischemia; no change from 2006    Current Outpatient Prescriptions  Medication Sig Dispense Refill  . allopurinol (ZYLOPRIM) 300 MG tablet Take 300 mg by mouth daily.      . Ascorbic Acid (VITAMIN C) 500 MG tablet Take 500 mg by mouth daily.        Marland Kitchen aspirin 325 MG tablet Take 325 mg by mouth daily.        . calcitRIOL (ROCALTROL) 0.5 MCG capsule Take 0.5 mcg by mouth daily.        . furosemide (LASIX) 20 MG tablet Take 1 tablet (20 mg total) by mouth daily.  100 tablet  3  . glipiZIDE (GLUCOTROL) 10 MG tablet 10 mg. Half tab daily      . insulin glargine (LANTUS) 100 UNIT/ML injection Inject 32 Units into the skin at bedtime.       . metoprolol (TOPROL-XL) 200 MG 24 hr tablet Take 100 mg by mouth daily. Half tab daily      . Multiple  Vitamins-Minerals (MULTIVITAMIN WITH MINERALS) tablet Take 1 tablet by mouth daily.        . nitroGLYCERIN (NITRODUR - DOSED IN MG/24 HR) 0.4 mg/hr Place 1 patch onto the skin as needed.      . trandolapril (MAVIK) 4 MG tablet Take 6 mg by mouth daily. 1 1/2 tab daily       No current facility-administered medications for this visit.    Allergies:   No Known Allergies  Social History:  The patient  reports that he has quit smoking. He does not have any smokeless tobacco history on file. He reports that  drinks alcohol. He reports that he does not use illicit drugs.   ROS:  Please see the history of present illness.    All other systems reviewed and negative.   PHYSICAL EXAM: VS:  BP 142/78  Pulse 66  Ht 5\' 6"  (1.676 m)  Wt 168 lb 12.8 oz (76.567 kg)  BMI 27.26 kg/m2 Well nourished, well developed, in no acute distress HEENT: normal Neck: no JVD Cardiac:  normal S1, S2; RRR; no  murmur Lungs:  clear to auscultation bilaterally, no wheezing, rhonchi or rales Abd: soft, nontender, no hepatomegaly Ext: no edema Skin: warm and dry Neuro:  CNs 2-12 intact, no focal abnormalities noted  EKG:  NSR, HR 66, inf Q waves, no change from prior tracing     ASSESSMENT AND PLAN:  1. CAD:  Recent myoview low risk.  He has good functional status.  He does not have angina.  Continue ASA and beta blocker.  He would benefit from statin Rx for secondary prevention.  He denies any intolerances to statins.  Start Lipitor 20 mg QD.  Check Lipids and LFTs in 6 weeks.   2. Hypertension:  Fair control.  Continue current Rx. 3. Hyperlipidemia:  Start Lipitor as noted. 4. CKD:  Follow by Dr. Darrick Penna. 5. Cataracts:  He is to have his left eye surgery next week.   6. Disposition:  F/u with Dr. Rollene Rotunda in 6 mos.   Signed, Tereso Newcomer, PA-C  10/30/2012 3:02 PM

## 2012-10-30 NOTE — Patient Instructions (Addendum)
START LIPITOR 20 MG 1 TABLET EVERY NIGHT  FASTING LIPID AND LIVER PANEL 01/03/13  PLEASE FOLLOW UP WITH DR. HOCHREIN IN 6 MONTHS

## 2013-01-28 ENCOUNTER — Other Ambulatory Visit: Payer: Self-pay

## 2013-01-28 DIAGNOSIS — I2510 Atherosclerotic heart disease of native coronary artery without angina pectoris: Secondary | ICD-10-CM

## 2013-01-28 MED ORDER — ATORVASTATIN CALCIUM 20 MG PO TABS: 20.0000 mg | ORAL_TABLET | Freq: Every day | ORAL | Status: DC

## 2013-02-07 ENCOUNTER — Other Ambulatory Visit: Payer: Self-pay | Admitting: Family Medicine

## 2013-03-26 ENCOUNTER — Other Ambulatory Visit: Payer: Self-pay | Admitting: *Deleted

## 2013-03-26 DIAGNOSIS — I10 Essential (primary) hypertension: Secondary | ICD-10-CM

## 2013-03-26 MED ORDER — FUROSEMIDE 20 MG PO TABS: 20.0000 mg | ORAL_TABLET | Freq: Every day | ORAL | Status: DC

## 2013-04-11 ENCOUNTER — Other Ambulatory Visit: Payer: Self-pay

## 2013-05-07 ENCOUNTER — Telehealth: Payer: Self-pay | Admitting: Family Medicine

## 2013-05-07 MED ORDER — INSULIN PEN NEEDLE 32G X 4 MM MISC: 1.0000 | Freq: Every day | Status: DC

## 2013-05-07 MED ORDER — GLUCOSE BLOOD VI STRP: 1.0000 | ORAL_STRIP | Freq: Every day | Status: DC

## 2013-05-07 MED ORDER — ACCU-CHEK MULTICLIX LANCETS MISC: 1.0000 | Freq: Every day | Status: AC

## 2013-05-07 NOTE — Telephone Encounter (Signed)
Spoke with patient and he would like to get his diabetes supplies from [employer].

## 2013-05-07 NOTE — Telephone Encounter (Signed)
Pt states he ordered his diabetic supplies form a company that called you. Pt doesn't know who it was, and is running out of supplies. Has not heard from this company either. Would like to know if you know where you sent this order so he can get in touch.

## 2013-06-06 HISTORY — PX: EYE SURGERY: SHX253

## 2013-08-07 ENCOUNTER — Other Ambulatory Visit: Payer: Self-pay | Admitting: Family Medicine

## 2013-08-22 ENCOUNTER — Other Ambulatory Visit: Payer: Self-pay | Admitting: *Deleted

## 2013-08-22 MED ORDER — GLIPIZIDE 10 MG PO TABS: ORAL_TABLET | ORAL | Status: DC

## 2013-08-26 ENCOUNTER — Other Ambulatory Visit: Payer: Self-pay | Admitting: Family Medicine

## 2013-09-19 ENCOUNTER — Other Ambulatory Visit (INDEPENDENT_AMBULATORY_CARE_PROVIDER_SITE_OTHER): Payer: Medicare Other

## 2013-09-19 DIAGNOSIS — E109 Type 1 diabetes mellitus without complications: Secondary | ICD-10-CM

## 2013-09-19 DIAGNOSIS — Z0000 Encounter for general adult medical examination without abnormal findings: Secondary | ICD-10-CM

## 2013-09-19 LAB — CBC WITH DIFFERENTIAL/PLATELET
BASOS ABS: 0 10*3/uL (ref 0.0–0.1)
Basophils Relative: 0.4 % (ref 0.0–3.0)
Eosinophils Absolute: 0.7 10*3/uL (ref 0.0–0.7)
Eosinophils Relative: 8.4 % — ABNORMAL HIGH (ref 0.0–5.0)
HCT: 41.5 % (ref 39.0–52.0)
Hemoglobin: 13.8 g/dL (ref 13.0–17.0)
LYMPHS PCT: 24.2 % (ref 12.0–46.0)
Lymphs Abs: 2 10*3/uL (ref 0.7–4.0)
MCHC: 33.3 g/dL (ref 30.0–36.0)
MCV: 92.7 fl (ref 78.0–100.0)
MONOS PCT: 6.3 % (ref 3.0–12.0)
Monocytes Absolute: 0.5 10*3/uL (ref 0.1–1.0)
NEUTROS ABS: 4.9 10*3/uL (ref 1.4–7.7)
NEUTROS PCT: 60.7 % (ref 43.0–77.0)
Platelets: 186 10*3/uL (ref 150.0–400.0)
RBC: 4.48 Mil/uL (ref 4.22–5.81)
RDW: 15.1 % — AB (ref 11.5–14.6)
WBC: 8.1 10*3/uL (ref 4.5–10.5)

## 2013-09-19 LAB — POCT URINALYSIS DIPSTICK
Bilirubin, UA: NEGATIVE
Blood, UA: NEGATIVE
KETONES UA: NEGATIVE
LEUKOCYTES UA: NEGATIVE
Nitrite, UA: NEGATIVE
PH UA: 6
Spec Grav, UA: 1.015
Urobilinogen, UA: 0.2

## 2013-09-19 LAB — BASIC METABOLIC PANEL
BUN: 35 mg/dL — ABNORMAL HIGH (ref 6–23)
CALCIUM: 9.2 mg/dL (ref 8.4–10.5)
CO2: 28 mEq/L (ref 19–32)
CREATININE: 2.3 mg/dL — AB (ref 0.4–1.5)
Chloride: 104 mEq/L (ref 96–112)
GFR: 29.43 mL/min — AB (ref >60.00)
Glucose, Bld: 190 mg/dL — ABNORMAL HIGH (ref 70–99)
Potassium: 4.2 mEq/L (ref 3.5–5.1)
Sodium: 141 mEq/L (ref 135–145)

## 2013-09-19 LAB — PSA: PSA: 0.85 ng/mL (ref 0.10–4.00)

## 2013-09-19 LAB — LIPID PANEL
Cholesterol: 143 mg/dL (ref 0–200)
HDL: 35.1 mg/dL — ABNORMAL LOW (ref >39.00)
LDL Cholesterol: 41 mg/dL (ref 0–99)
Total CHOL/HDL Ratio: 4
Triglycerides: 335 mg/dL — ABNORMAL HIGH (ref 0.0–149.0)
VLDL: 67 mg/dL — AB (ref 0.0–40.0)

## 2013-09-19 LAB — HEPATIC FUNCTION PANEL
ALBUMIN: 3.5 g/dL (ref 3.5–5.2)
ALK PHOS: 97 U/L (ref 39–117)
ALT: 24 U/L (ref 0–53)
AST: 16 U/L (ref 0–37)
BILIRUBIN DIRECT: 0.1 mg/dL (ref 0.0–0.3)
Total Bilirubin: 1.1 mg/dL (ref 0.3–1.2)
Total Protein: 6.2 g/dL (ref 6.0–8.3)

## 2013-09-19 LAB — MICROALBUMIN / CREATININE URINE RATIO
CREATININE, U: 66.1 mg/dL
MICROALB/CREAT RATIO: 98.5 mg/g — AB (ref 0.0–30.0)
Microalb, Ur: 65.1 mg/dL — ABNORMAL HIGH (ref 0.0–1.9)

## 2013-09-19 LAB — TSH: TSH: 2.81 u[IU]/mL (ref 0.35–5.50)

## 2013-09-19 LAB — HEMOGLOBIN A1C: Hgb A1c MFr Bld: 8.2 % — ABNORMAL HIGH (ref 4.6–6.5)

## 2013-09-26 ENCOUNTER — Encounter: Payer: Self-pay | Admitting: Family Medicine

## 2013-09-26 ENCOUNTER — Ambulatory Visit (INDEPENDENT_AMBULATORY_CARE_PROVIDER_SITE_OTHER): Payer: Medicare Other | Admitting: Family Medicine

## 2013-09-26 VITALS — BP 140/80 | Temp 98.1°F | Ht 66.25 in | Wt 176.0 lb

## 2013-09-26 DIAGNOSIS — M109 Gout, unspecified: Secondary | ICD-10-CM

## 2013-09-26 DIAGNOSIS — F528 Other sexual dysfunction not due to a substance or known physiological condition: Secondary | ICD-10-CM

## 2013-09-26 DIAGNOSIS — N259 Disorder resulting from impaired renal tubular function, unspecified: Secondary | ICD-10-CM | POA: Diagnosis not present

## 2013-09-26 DIAGNOSIS — Z0000 Encounter for general adult medical examination without abnormal findings: Secondary | ICD-10-CM

## 2013-09-26 DIAGNOSIS — I739 Peripheral vascular disease, unspecified: Secondary | ICD-10-CM

## 2013-09-26 DIAGNOSIS — I219 Acute myocardial infarction, unspecified: Secondary | ICD-10-CM | POA: Diagnosis not present

## 2013-09-26 DIAGNOSIS — E785 Hyperlipidemia, unspecified: Secondary | ICD-10-CM

## 2013-09-26 DIAGNOSIS — E109 Type 1 diabetes mellitus without complications: Secondary | ICD-10-CM

## 2013-09-26 DIAGNOSIS — B351 Tinea unguium: Secondary | ICD-10-CM

## 2013-09-26 DIAGNOSIS — I10 Essential (primary) hypertension: Secondary | ICD-10-CM

## 2013-09-26 DIAGNOSIS — I2510 Atherosclerotic heart disease of native coronary artery without angina pectoris: Secondary | ICD-10-CM

## 2013-09-26 DIAGNOSIS — E7800 Pure hypercholesterolemia, unspecified: Secondary | ICD-10-CM

## 2013-09-26 MED ORDER — ALLOPURINOL 300 MG PO TABS: 300.0000 mg | ORAL_TABLET | Freq: Every day | ORAL | Status: DC

## 2013-09-26 MED ORDER — TRANDOLAPRIL 4 MG PO TABS: 6.0000 mg | ORAL_TABLET | Freq: Every day | ORAL | Status: DC

## 2013-09-26 MED ORDER — FUROSEMIDE 20 MG PO TABS: 20.0000 mg | ORAL_TABLET | Freq: Every day | ORAL | Status: DC

## 2013-09-26 MED ORDER — NITROGLYCERIN 0.4 MG/HR TD PT24: 0.4000 mg | MEDICATED_PATCH | TRANSDERMAL | Status: DC | PRN

## 2013-09-26 MED ORDER — GLIPIZIDE 10 MG PO TABS: ORAL_TABLET | ORAL | Status: DC

## 2013-09-26 MED ORDER — INSULIN GLARGINE 100 UNIT/ML SOLOSTAR PEN: PEN_INJECTOR | SUBCUTANEOUS | Status: DC

## 2013-09-26 MED ORDER — METOPROLOL SUCCINATE ER 200 MG PO TB24: ORAL_TABLET | ORAL | Status: DC

## 2013-09-26 NOTE — Patient Instructions (Signed)
Insulin....... 40 units daily  Glipizide 10 mg........ one tablet daily in the morning before breakfast  Walk 30 minutes daily  Drink lots of water  Continue your current medication  Followup in 3 months for your diabetes.......Marland Kitchen A1c one week prior nonfasting  Call Dr. Gloriann Loan DDS tomorrow  Soak and file your toenails weekly

## 2013-09-26 NOTE — Progress Notes (Signed)
Subjective:    Patient ID: Douglas Bade Sr., male    DOB: 28-Nov-1940, 73 y.o.   MRN: [id number]  HPI ken is a 73 year old male married nonsmoker who comes in today for a Medicare wellness examination because of a history of gout, hyperlipidemia, hypertension, renal insufficiency, coronary disease, diabetes type 1,  His medication reviewed in the no changes. His blood sugar was in the 190 range now he says is down to 120. His insulin is 39 units daily along with 5 mg milligrams of glipizide daily. A1c was 7.39 3.2%.  He gets routine eye care, no dental care, colonoscopy and GI, vaccinations updated by Apolonio Schneiders  Cognitive function normal he walks on a regular basis home health safety reviewed no issues identified, no guns in the house, he does have a health care power of attorney and living well  EKG done in cardiology    Review of Systems  Constitutional: Negative.   HENT: Negative.   Eyes: Negative.   Respiratory: Negative.   Cardiovascular: Negative.   Gastrointestinal: Negative.   Genitourinary: Negative.   Musculoskeletal: Negative.   Skin: Negative.   Neurological: Negative.   Psychiatric/Behavioral: Negative.        Objective:   Physical Exam  Nursing note and vitals reviewed. Constitutional: He is oriented to person, place, and time. He appears well-developed and well-nourished.  HENT:  Head: Normocephalic and atraumatic.  Right Ear: External ear normal.  Left Ear: External ear normal.  Nose: Nose normal.  Mouth/Throat: Oropharynx is clear and moist.  Severe periodontal disease  Eyes: Conjunctivae and EOM are normal. Pupils are equal, round, and reactive to light.  Neck: Normal range of motion. Neck supple. No JVD present. No tracheal deviation present. No thyromegaly present.  Cardiovascular: Normal rate, regular rhythm, normal heart sounds and intact distal pulses.  Exam reveals no gallop and no friction rub.   No murmur heard. Right carotid bruit......... left  normal Aorta normal  Peripheral pulses 1+ and symmetrical    Pulmonary/Chest: Effort normal and breath sounds normal. No stridor. No respiratory distress. He has no wheezes. He has no rales. He exhibits no tenderness.  Abdominal: Soft. Bowel sounds are normal. He exhibits no distension and no mass. There is no tenderness. There is no rebound and no guarding.  Genitourinary: Rectum normal, prostate normal and penis normal. Guaiac negative stool. No penile tenderness.  Musculoskeletal: Normal range of motion. He exhibits no edema and no tenderness.  Lymphadenopathy:    He has no cervical adenopathy.  Neurological: He is alert and oriented to person, place, and time. He has normal reflexes. No cranial nerve deficit. He exhibits normal muscle tone.  Skin: Skin is warm and dry. No rash noted. No erythema. No pallor.  Total body skin exam normal except for all 10 toenails are thickened with fungus. I cleaned each one with alcohol and trimmed his toenails. He knows he is at high risk for heart disease because of his diabetes    Psychiatric: He has a normal mood and affect. His behavior is normal. Judgment and thought content normal.          Assessment & Plan:  Diabetes not at goal increase insulin to 40 units increase glipizide to 10 mg followup A1c in 3 months........... diabetes may not be at goal because of severe periodontal disease  Periodontal disease.... consult with Dr. Gloriann Loan ASAP  Hypertension at goal continue current therapy  Hyperlipidemia goal continue current therapy  Coronary disease asymptomatic continue current therapy  Renal insufficiency stable  Chronic fungal infection and all 10 toenails........... trimmed by me

## 2013-09-26 NOTE — Progress Notes (Signed)
Pre visit review using our clinic review tool, if applicable. No additional management support is needed unless otherwise documented below in the visit note. 

## 2013-09-27 ENCOUNTER — Telehealth: Payer: Self-pay | Admitting: Family Medicine

## 2013-09-27 NOTE — Telephone Encounter (Signed)
Relevant patient education assigned to patient using Emmi. ° °

## 2013-10-04 ENCOUNTER — Telehealth: Payer: Self-pay

## 2013-10-04 NOTE — Telephone Encounter (Signed)
Relevant patient education assigned to patient using Emmi. ° °

## 2013-10-15 ENCOUNTER — Telehealth: Payer: Self-pay | Admitting: Family Medicine

## 2013-10-15 NOTE — Telephone Encounter (Signed)
Pt would like to see Dr Sherren Mocha on Mon 10/21/13 may I  Use an afternoon sda slot

## 2013-10-16 NOTE — Telephone Encounter (Signed)
May i use sda slot for pt on [facility] 10/21/13

## 2013-10-17 NOTE — Telephone Encounter (Signed)
yes

## 2013-12-10 ENCOUNTER — Other Ambulatory Visit (INDEPENDENT_AMBULATORY_CARE_PROVIDER_SITE_OTHER): Payer: Medicare Other

## 2013-12-10 DIAGNOSIS — E109 Type 1 diabetes mellitus without complications: Secondary | ICD-10-CM

## 2013-12-10 LAB — BASIC METABOLIC PANEL
BUN: 45 mg/dL — AB (ref 6–23)
CO2: 32 mEq/L (ref 19–32)
CREATININE: 2.4 mg/dL — AB (ref 0.4–1.5)
Calcium: 9.3 mg/dL (ref 8.4–10.5)
Chloride: 108 mEq/L (ref 96–112)
GFR: 27.88 mL/min — AB (ref >60.00)
Glucose, Bld: 90 mg/dL (ref 70–99)
Potassium: 3.9 mEq/L (ref 3.5–5.1)
Sodium: 142 mEq/L (ref 135–145)

## 2013-12-10 LAB — HEMOGLOBIN A1C: Hgb A1c MFr Bld: 8.3 % — ABNORMAL HIGH (ref 4.6–6.5)

## 2013-12-10 LAB — MICROALBUMIN / CREATININE URINE RATIO
CREATININE, U: 106.2 mg/dL
MICROALB/CREAT RATIO: 60.7 mg/g — AB (ref 0.0–30.0)
Microalb, Ur: 64.5 mg/dL — ABNORMAL HIGH (ref 0.0–1.9)

## 2013-12-17 ENCOUNTER — Ambulatory Visit (INDEPENDENT_AMBULATORY_CARE_PROVIDER_SITE_OTHER): Payer: Medicare Other | Admitting: Family Medicine

## 2013-12-17 ENCOUNTER — Encounter: Payer: Self-pay | Admitting: Family Medicine

## 2013-12-17 VITALS — BP 200/100 | Temp 98.1°F | Wt 178.0 lb

## 2013-12-17 DIAGNOSIS — I10 Essential (primary) hypertension: Secondary | ICD-10-CM

## 2013-12-17 DIAGNOSIS — N259 Disorder resulting from impaired renal tubular function, unspecified: Secondary | ICD-10-CM

## 2013-12-17 DIAGNOSIS — E109 Type 1 diabetes mellitus without complications: Secondary | ICD-10-CM

## 2013-12-17 MED ORDER — TRANDOLAPRIL 4 MG PO TABS
ORAL_TABLET | ORAL | Status: DC
Start: 2013-12-17 — End: 2014-04-07

## 2013-12-17 NOTE — Progress Notes (Signed)
Pre visit review using our clinic review tool, if applicable. No additional management support is needed unless otherwise documented below in the visit note. 

## 2013-12-17 NOTE — Progress Notes (Signed)
   Subjective:    Patient ID: Douglas Korol Sr., male    DOB: 10/13/1940, 73 y.o.   MRN: [id number]  HPI Yvone Neu is a 73 year old male nonsmoker who comes in today for evaluation of multiple issues  He has a history of diabetes and is on Lantus 39 units at bedtime, glipizide 10 mg daily and blood sugars in the 90s in the morning.  His blood pressure today is 200/100 right arm sitting position repeat by me same  Historically he went to see Dr. Noemi Chapel for an orthopedic evaluation. He says he saw the PA first and then Dr. Noemi Chapel came in. He was given Mobic. Because his diabetes and renal insufficiency he should never be given an NSAID again. Fortunately he went to see Dr. Donley Redder his nephrologist within 10 days of starting the Mobic and was stopped immediately.. Advised in the future not to take any medication unless Dr. Donley Redder R. I review it.  A1c 8.3%, BUN 45 stable, creatinine 2.5 stable GFR 27 stable therefore don't think the Mobic did any permanent renal damage     Review of Systems    review of systems otherwise negative Objective:   Physical Exam  Well-developed well-nourished male no acute distress vital signs stable he is afebrile BP right arm sitting position 200/100 pulse 70 and regular      Assessment & Plan:  Hypertension not at goal............... increase Mavik to 8 mg daily  BP check daily followup in 4 weeks  Diabetes type 1........Marland Kitchen blood sugar down in the 80-90 range........... continue current medicines followup in 3 months A1c

## 2013-12-17 NOTE — Patient Instructions (Signed)
Increase the Mavik to 2 tabs daily  Check your blood pressure daily in the morning  Return in one month for followup

## 2014-01-16 ENCOUNTER — Encounter: Payer: Self-pay | Admitting: Family Medicine

## 2014-01-16 ENCOUNTER — Ambulatory Visit (INDEPENDENT_AMBULATORY_CARE_PROVIDER_SITE_OTHER): Payer: Medicare Other | Admitting: Family Medicine

## 2014-01-16 VITALS — BP 180/90 | Temp 98.2°F | Wt 176.0 lb

## 2014-01-16 DIAGNOSIS — I10 Essential (primary) hypertension: Secondary | ICD-10-CM

## 2014-01-16 DIAGNOSIS — N259 Disorder resulting from impaired renal tubular function, unspecified: Secondary | ICD-10-CM

## 2014-01-16 NOTE — Progress Notes (Signed)
   Subjective:    Patient ID: Douglas Delarosa Sr., male    DOB: [birth year], 73 y.o.   MRN: [id number]  HPI Douglas Meyer is a 73 year old male who comes in today for followup of hypertension  He has a history of renal insufficiency diabetes coronary artery disease. His blood pressure today when he came in was 180/90. 20 minutes later I checked it again was 150/80. Home blood pressures 130/80.  Blood sugar normal at home 100 this morning Review of Systems    review of systems otherwise negative Objective:   Physical Exam  Well-developed well-nourished male in no acute distress vital signs stable he is afebrile BP right arm sitting position 150/90      Assessment & Plan:  Hypertension question goal.............. purchased new blood pressure cuff........ BP checked daily at home....... followup in 4 weeks.Marland Kitchen

## 2014-01-16 NOTE — Progress Notes (Signed)
Pre visit review using our clinic review tool, if applicable. No additional management support is needed unless otherwise documented below in the visit note. 

## 2014-01-16 NOTE — Patient Instructions (Signed)
Check your blood pressure daily at home  Followup in 4 weeks.............Marland Kitchen bring a record of all your blood pressure readings and the device   [employer]...........Marland Kitchen digital pump up blood pressure cuff

## 2014-02-18 ENCOUNTER — Encounter: Payer: Self-pay | Admitting: Family Medicine

## 2014-02-18 ENCOUNTER — Ambulatory Visit (INDEPENDENT_AMBULATORY_CARE_PROVIDER_SITE_OTHER): Payer: Medicare Other | Admitting: Family Medicine

## 2014-02-18 VITALS — BP 190/94 | Temp 98.4°F | Wt 175.0 lb

## 2014-02-18 DIAGNOSIS — N259 Disorder resulting from impaired renal tubular function, unspecified: Secondary | ICD-10-CM

## 2014-02-18 DIAGNOSIS — I10 Essential (primary) hypertension: Secondary | ICD-10-CM

## 2014-02-18 MED ORDER — AMLODIPINE BESYLATE 2.5 MG PO TABS: 2.5000 mg | ORAL_TABLET | Freq: Every day | ORAL | Status: DC

## 2014-02-18 NOTE — Patient Instructions (Signed)
Continue your current medications  Add Norvasc 2.5 mg............. one tablet daily in the morning  Followup by nephrology ASAP

## 2014-02-18 NOTE — Progress Notes (Signed)
Pre visit review using our clinic review tool, if applicable. No additional management support is needed unless otherwise documented below in the visit note. Lab Results  Component Value Date   HGBA1C 8.3* 12/10/2013   HGBA1C 8.2* 09/19/2013   HGBA1C 7.3* 09/25/2012   Lab Results  Component Value Date   MICROALBUR 64.5* 12/10/2013   LDLCALC 41 09/19/2013   CREATININE 2.4* 12/10/2013

## 2014-02-18 NOTE — Progress Notes (Signed)
   Subjective:    Patient ID: Douglas Mckeithan Sr., male    DOB: Dec 31, 1940, 73 y.o.   MRN: [id number]  HPI Douglas Meyer is a 73 year old male who comes in today for followup of hypertension his BP has been consistently 190/95 for the past couple weeks. He's been compliant with his medication. On 100 mg of the beta blocker pulse is 60 and regular. He's also on Lasix 20 mg daily, Mavik 8 mg daily. This is from Dr. Donley Redder his nephrologist   Review of Systems Review of systems otherwise negative except for continued poor blood sugar control    Objective:   Physical Exam  Well-developed well-nourished male no acute distress vital signs stable he is afebrile except for BP 190/95      Assessment & Plan:  Hypertension not at goal.......... add Norvasc 2.5 mg followup with nephrology ASAP.

## 2014-02-25 ENCOUNTER — Telehealth: Payer: Self-pay | Admitting: Family Medicine

## 2014-02-25 DIAGNOSIS — I10 Essential (primary) hypertension: Secondary | ICD-10-CM

## 2014-02-25 MED ORDER — METOPROLOL SUCCINATE ER 200 MG PO TB24: ORAL_TABLET | ORAL | Status: DC

## 2014-02-25 NOTE — Telephone Encounter (Signed)
[facility] - [place], [place] - [address] is requesting re-fill on metoprolol (TOPROL-XL) 200 MG 24 hr tablet

## 2014-04-07 ENCOUNTER — Telehealth: Payer: Self-pay | Admitting: Family Medicine

## 2014-04-07 DIAGNOSIS — E109 Type 1 diabetes mellitus without complications: Secondary | ICD-10-CM

## 2014-04-07 DIAGNOSIS — N259 Disorder resulting from impaired renal tubular function, unspecified: Secondary | ICD-10-CM

## 2014-04-07 DIAGNOSIS — M1000 Idiopathic gout, unspecified site: Secondary | ICD-10-CM

## 2014-04-07 DIAGNOSIS — I10 Essential (primary) hypertension: Secondary | ICD-10-CM

## 2014-04-07 MED ORDER — METOPROLOL SUCCINATE ER 200 MG PO TB24: ORAL_TABLET | ORAL | Status: DC

## 2014-04-07 MED ORDER — ALLOPURINOL 300 MG PO TABS: 300.0000 mg | ORAL_TABLET | Freq: Every day | ORAL | Status: DC

## 2014-04-07 MED ORDER — GLIPIZIDE 10 MG PO TABS: ORAL_TABLET | ORAL | Status: DC

## 2014-04-07 MED ORDER — TRANDOLAPRIL 4 MG PO TABS: ORAL_TABLET | ORAL | Status: DC

## 2014-04-07 MED ORDER — INSULIN GLARGINE 100 UNIT/ML SOLOSTAR PEN: PEN_INJECTOR | SUBCUTANEOUS | Status: DC

## 2014-04-07 MED ORDER — AMLODIPINE BESYLATE 2.5 MG PO TABS: 2.5000 mg | ORAL_TABLET | Freq: Every day | ORAL | Status: DC

## 2014-04-07 NOTE — Telephone Encounter (Signed)
[facility] [place], [place] is requesting re-fills on allopurinol (ZYLOPRIM) 300 MG tablet, glipiZIDE (GLUCOTROL) 10 MG tablet, and  metoprolol (TOPROL-XL) 200 MG 24 hr tablet

## 2014-04-07 NOTE — Telephone Encounter (Signed)
Please add trandolapril (MAVIK) 4 MG tablet, amLODipine (NORVASC) 2.5 MG tablet, and Insulin Glargine (LANTUS SOLOSTAR) 100 UNIT/ML Solostar Pen to the below re-fill request

## 2014-04-07 NOTE — Telephone Encounter (Signed)
rx sent

## 2014-04-10 ENCOUNTER — Other Ambulatory Visit: Payer: Self-pay

## 2014-04-10 MED ORDER — ATORVASTATIN CALCIUM 20 MG PO TABS: 20.0000 mg | ORAL_TABLET | Freq: Every day | ORAL | Status: DC

## 2014-04-22 ENCOUNTER — Telehealth: Payer: Self-pay | Admitting: Cardiology

## 2014-04-22 NOTE — Telephone Encounter (Signed)
New message     Dentist office calling . Patient sitting in chair .    Please advise on pre-med .

## 2014-04-22 NOTE — Telephone Encounter (Signed)
He does not need SBE prophylaxis.   

## 2014-04-22 NOTE — Telephone Encounter (Signed)
Received call from [place] at [place] office she wanted to know if patient requires antibiotic before dental cleaning or any dental procedure.Message sent to [place] for advice.

## 2014-04-23 NOTE — Telephone Encounter (Signed)
Returned call to [place] with Dr.Johnson's office. Dr.Hochrein advised patient does not need SBE prophylaxis.Note faxed to fax # [phone].

## 2014-06-20 LAB — HM DIABETES EYE EXAM

## 2014-06-24 ENCOUNTER — Encounter: Payer: Self-pay | Admitting: Family Medicine

## 2014-06-26 ENCOUNTER — Other Ambulatory Visit: Payer: Self-pay | Admitting: Family Medicine

## 2014-07-10 ENCOUNTER — Other Ambulatory Visit: Payer: Self-pay | Admitting: Dermatology

## 2014-07-21 ENCOUNTER — Other Ambulatory Visit: Payer: Self-pay | Admitting: Cardiology

## 2014-08-18 ENCOUNTER — Other Ambulatory Visit: Payer: Self-pay | Admitting: Cardiology

## 2014-08-18 NOTE — Telephone Encounter (Signed)
Rx has been sent to the pharmacy electronically. ° °

## 2014-08-23 ENCOUNTER — Other Ambulatory Visit: Payer: Self-pay | Admitting: Family Medicine

## 2014-09-22 ENCOUNTER — Ambulatory Visit (INDEPENDENT_AMBULATORY_CARE_PROVIDER_SITE_OTHER): Payer: Medicare Other | Admitting: Physician Assistant

## 2014-09-22 ENCOUNTER — Encounter: Payer: Self-pay | Admitting: Physician Assistant

## 2014-09-22 VITALS — Ht 66.25 in | Wt 173.1 lb

## 2014-09-22 DIAGNOSIS — R0989 Other specified symptoms and signs involving the circulatory and respiratory systems: Secondary | ICD-10-CM

## 2014-09-22 DIAGNOSIS — I10 Essential (primary) hypertension: Secondary | ICD-10-CM

## 2014-09-22 DIAGNOSIS — J810 Acute pulmonary edema: Secondary | ICD-10-CM

## 2014-09-22 DIAGNOSIS — R609 Edema, unspecified: Secondary | ICD-10-CM | POA: Diagnosis not present

## 2014-09-22 DIAGNOSIS — R600 Localized edema: Secondary | ICD-10-CM

## 2014-09-22 DIAGNOSIS — I501 Left ventricular failure: Secondary | ICD-10-CM | POA: Diagnosis not present

## 2014-09-22 DIAGNOSIS — I5030 Unspecified diastolic (congestive) heart failure: Secondary | ICD-10-CM

## 2014-09-22 MED ORDER — FUROSEMIDE 80 MG PO TABS: 80.0000 mg | ORAL_TABLET | Freq: Every day | ORAL | Status: DC

## 2014-09-22 MED ORDER — TRANDOLAPRIL 4 MG PO TABS: ORAL_TABLET | ORAL | Status: DC

## 2014-09-22 MED ORDER — METOPROLOL SUCCINATE ER 200 MG PO TB24: ORAL_TABLET | ORAL | Status: DC

## 2014-09-22 NOTE — Addendum Note (Signed)
Addended by: Leanord Asal T on: 09/22/2014 02:49 PM   Modules accepted: Orders, Medications

## 2014-09-22 NOTE — Progress Notes (Signed)
Cardiology Office Note   Date:  09/22/2014   ID:  Douglas Mow Sr., DOB 02/05/1941, MRN [id number]  PCP:  Joycelyn Man, MD  Cardiologist:  Dr. Minus Breeding Kidney: Dr. Jimmy Footman  Chief Complaint: Medication refill, edema, elevated blood pressures  History of Present Illness: Douglas Tomb Sr. is a 74 y.o. male who presents for early follow-up.He has a history of CAD with out of hospital MI in the 90s and prior cath reportedly showing occluded unknown vessel with collaterals. No record of cath report available. Also has diabetes mellitus 2, hypertension, hyperlipidemia, chronic kidney disease and PAD. He had an abnormal stress test in 10/2012 and then underwent Lexi scan Myoview EF 53% with inferior and inferolateral scar slight peri-infarct ischemia, small area of apical ischemia no change from 2006. Dr. Percival Spanish reviewed and felt it to be low risk. No further workup was suggested. He was last seen in the office by Richardson Dopp, PA-C 10/2012.  Patient has been seeing Dr. Jimmy Footman every 6 weeks for elevated blood pressure and ankle edema. His Lasix was increased from 20 mg daily to 80 mg daily. His blood pressure is much better and his swelling is gone. He denied any chest pain, palpitations, dyspnea, dyspnea on exertion, dizziness or presyncope. He needs his medications refilled. He has not had no changes in the past year in his medical history other than elevated blood pressure and edema. Dr. Sherren Mocha manages his cholesterol.      Past Medical History  Diagnosis Date  . CAD (coronary artery disease) 05/15/10    out of hospital myocardial infarction in the 1990's. this was picked up on an  EKG in 1997.  Cardiac catherterization demonstrated an occluded vessel and collateras.  I have no description of this.  A stress  perfusion study  done 2008 demonstrated an ejection fraction  of  53% with inferolateral  hypokinesis.  There was an inferolateral defect with  scar and some mild mixed  ischemia.   managed rx  . Diabetes mellitus     DM, type 1  . Hyperlipidemia   . Hypertension   . PVD (peripheral vascular disease)   . ED (erectile dysfunction)   . CKD (chronic kidney disease), stage IV   . Nephrolithiasis   . Gout   . Polyp of colon   . Hx of cardiovascular stress test     a. Lexiscan myoview 10/18/12: EF 53%, inf and inf-lat scar, slight peri-infarct ischemia, small area of apical ischemia; no change from 2006    Past Surgical History  Procedure Laterality Date  . Humerus fracture surgery      left  . Mohs surgery      multiple     Current Outpatient Prescriptions  Medication Sig Dispense Refill  . ACCU-CHEK AVIVA PLUS test strip USE ONE STRIP ONCE DAILY 100 each 5  . allopurinol (ZYLOPRIM) 300 MG tablet Take 1 tablet (300 mg total) by mouth daily. 100 tablet 3  . Ascorbic Acid (VITAMIN C) 500 MG tablet Take 500 mg by mouth daily.      Marland Kitchen aspirin 325 MG tablet Take 325 mg by mouth daily.      Marland Kitchen atorvastatin (LIPITOR) 20 MG tablet TAKE 1 BY MOUTH DAILY. NEEDS TO SCHEDULE APPT 30 tablet 0  . BD PEN NEEDLE NANO U/F 32G X 4 MM MISC USE ONE  ONCE DAILY 100 each 0  . calcitRIOL (ROCALTROL) 0.5 MCG capsule Take 0.5 mcg by mouth every other day.     Marland Kitchen  glipiZIDE (GLUCOTROL) 10 MG tablet One tablet daily 100 tablet 3  . Insulin Glargine (LANTUS SOLOSTAR) 100 UNIT/ML Solostar Pen INJECT 39 UNITS INTO THE SKIN AT BEDTIME 15 mL 6  . Lancets (ACCU-CHEK MULTICLIX) lancets 1 each by Other route daily. Dx 250.01 102 each 3  . metoprolol (TOPROL-XL) 200 MG 24 hr tablet TAKE ONE-HALF TABLET BY MOUTH EVERY DAY 100 tablet 3  . Multiple Vitamins-Minerals (MULTIVITAMIN WITH MINERALS) tablet Take 1 tablet by mouth daily.      . nitroGLYCERIN (NITRODUR - DOSED IN MG/24 HR) 0.4 mg/hr patch Place 1 patch (0.4 mg total) onto the skin as needed. 30 patch 1  . trandolapril (MAVIK) 4 MG tablet 2 tab daily 200 tablet 3   No current facility-administered medications for this visit.     Allergies:   Review of patient's allergies indicates no known allergies.    Social History:  The patient  reports that he has quit smoking. He does not have any smokeless tobacco history on file. He reports that he drinks alcohol. He reports that he does not use illicit drugs.   Family History:  The patient's    family history includes Diabetes in his brother and sister; Diabetes type II in his brother and brother; Heart attack in his father and mother.    ROS:  Please see the history of present illness.   Otherwise, review of systems are positive for none.   All other systems are reviewed and negative.    PHYSICAL EXAM: VS:  Ht 5' 6.25" (1.683 m)  Wt 173 lb 1.9 oz (78.527 kg)  BMI 27.72 kg/m2 , BMI Body mass index is 27.72 kg/(m^2). GEN: Well nourished, well developed, in no acute distress Neck: Bilateral carotid bruits right greater than left, no JVD, HJR,  or masses Cardiac:RRR; 2/6 systolic murmur at the left sternal border, positive S4, no rubs, thrill or heave,  Respiratory:  clear to auscultation bilaterally, normal work of breathing GI: soft, nontender, nondistended, + BS MS: no deformity or atrophy Extremities: without cyanosis, clubbing, edema, good distal pulses bilaterally.  Skin: warm and dry, no rash Neuro:  Strength and sensation are intact    EKG:  EKG is ordered today. The ekg ordered today demonstrates normal sinus rhythm inferior lateral Q waves   Recent Labs: 12/10/2013: BUN 45*; Creatinine 2.4*; Potassium 3.9; Sodium 142    Lipid Panel    Component Value Date/Time   CHOL 143 09/19/2013 1005   TRIG 335.0* 09/19/2013 1005   HDL 35.10* 09/19/2013 1005   CHOLHDL 4 09/19/2013 1005   VLDL 67.0* 09/19/2013 1005   LDLCALC 41 09/19/2013 1005   LDLDIRECT 90.0 06/26/2012 0846      Wt Readings from Last 3 Encounters:  09/22/14 173 lb 1.9 oz (78.527 kg)  02/18/14 175 lb (79.379 kg)  01/16/14 176 lb (79.833 kg)      Other studies Reviewed: Additional  studies/ records that were reviewed today include and review of the records demonstrates:   Lexi scan Myoview 2014 Overall Impression:  This study is unchanged from the prior study of 2006. There is moderate scar affecting/the base/mid inferior wall and the base/mid inferolateral wall. There may be very slight peri-infarct ischemia in this area. There is also a very small area of ischemia at the apical cap. This was also seen in the past.  LV Ejection Fraction: 53%.  LV Wall Motion:  There is mild inferobasilar hypokinesis.     ASSESSMENT AND PLAN: Coronary atherosclerosis Stable without chest  pain. Continue current medications.   Essential hypertension Blood pressure is stable today. It has been elevated and Lasix was increased from 20 mg to 80 mg daily by Dr.Deterding. Because of this and his edema will check 2-D echo for LV function and LVH. All up with Dr. Percival Spanish.   RENAL INSUFFICIENCY Being followed closely by renal.   HYPERLIPIDEMIA Last lipid panel 09/2013 followed by Dr. Sherren Mocha.   Carotid bruit Patient has bilateral carotid bruits right greater than left. He's never had Dopplers arm. Will order carotid Dopplers.      Signed, Ermalinda Barrios, PA-C  09/22/2014 1:58 PM    [facility] [address], [place], [place]  [address] Phone: [phone]; Fax: [phone]

## 2014-09-22 NOTE — Assessment & Plan Note (Signed)
Being followed closely by renal.

## 2014-09-22 NOTE — Assessment & Plan Note (Signed)
Stable without chest pain. Continue current medications.

## 2014-09-22 NOTE — Patient Instructions (Addendum)
Medication Instructions:   START TAKING 80 MG LASIX ONCE A DAY   STOP TAKING LASIX 20 MG ONCE A DAY   Labwork: NONE TODAY   Testing/Procedures: Your physician has requested that you have a carotid duplex. This test is an ultrasound of the carotid arteries in your neck. It looks at blood flow through these arteries that supply the brain with blood. Allow one hour for this exam. There are no restrictions or special instructions.  Your physician has requested that you have an echocardiogram. Echocardiography is a painless test that uses sound waves to create images of your heart. It provides your doctor with information about the size and shape of your heart and how well your heart's chambers and valves are working. This procedure takes approximately one hour. There are no restrictions for this procedure.   Follow-Up: Your physician wants you to follow-up in:  WITH DR Gilliam 2 TO 3 MONTHS  You will receive a reminder letter in the mail two months in advance. If you don't receive a letter, please call our office to schedule the follow-up appointment.    Any Other Special Instructions Will Be Listed Below (If Applicable).

## 2014-09-22 NOTE — Assessment & Plan Note (Signed)
Last lipid panel 09/2013 followed by Dr. Sherren Mocha.

## 2014-09-22 NOTE — Assessment & Plan Note (Signed)
Blood pressure is stable today. It has been elevated and Lasix was increased from 20 mg to 80 mg daily by Dr.Deterding. Because of this and his edema will check 2-D echo for LV function and LVH. All up with Dr. Percival Spanish.

## 2014-09-22 NOTE — Assessment & Plan Note (Signed)
Patient has bilateral carotid bruits right greater than left. He's never had Dopplers arm. Will order carotid Dopplers.

## 2014-09-26 ENCOUNTER — Ambulatory Visit (HOSPITAL_COMMUNITY): Payer: Medicare Other | Attending: Cardiology | Admitting: Cardiology

## 2014-09-26 ENCOUNTER — Ambulatory Visit (HOSPITAL_BASED_OUTPATIENT_CLINIC_OR_DEPARTMENT_OTHER): Payer: Medicare Other | Admitting: Cardiology

## 2014-09-26 DIAGNOSIS — Z87891 Personal history of nicotine dependence: Secondary | ICD-10-CM | POA: Diagnosis not present

## 2014-09-26 DIAGNOSIS — E785 Hyperlipidemia, unspecified: Secondary | ICD-10-CM | POA: Diagnosis not present

## 2014-09-26 DIAGNOSIS — I2510 Atherosclerotic heart disease of native coronary artery without angina pectoris: Secondary | ICD-10-CM | POA: Diagnosis not present

## 2014-09-26 DIAGNOSIS — I6523 Occlusion and stenosis of bilateral carotid arteries: Secondary | ICD-10-CM

## 2014-09-26 DIAGNOSIS — R0989 Other specified symptoms and signs involving the circulatory and respiratory systems: Secondary | ICD-10-CM | POA: Insufficient documentation

## 2014-09-26 DIAGNOSIS — I501 Left ventricular failure: Secondary | ICD-10-CM | POA: Diagnosis not present

## 2014-09-26 DIAGNOSIS — I10 Essential (primary) hypertension: Secondary | ICD-10-CM | POA: Diagnosis not present

## 2014-09-26 DIAGNOSIS — I5030 Unspecified diastolic (congestive) heart failure: Secondary | ICD-10-CM

## 2014-09-26 DIAGNOSIS — Z794 Long term (current) use of insulin: Secondary | ICD-10-CM | POA: Insufficient documentation

## 2014-09-26 DIAGNOSIS — E119 Type 2 diabetes mellitus without complications: Secondary | ICD-10-CM | POA: Diagnosis not present

## 2014-09-26 DIAGNOSIS — I739 Peripheral vascular disease, unspecified: Secondary | ICD-10-CM | POA: Insufficient documentation

## 2014-09-26 NOTE — Progress Notes (Signed)
Carotid duplex performed. Preliminary results discussed with Dr. Gwenlyn Found:  80-99% RICA stenosis, at least 70-48% LICA stenosis.

## 2014-09-26 NOTE — Progress Notes (Signed)
Echo performed. 

## 2014-09-30 ENCOUNTER — Telehealth: Payer: Self-pay | Admitting: *Deleted

## 2014-09-30 NOTE — Telephone Encounter (Signed)
LMTCB

## 2014-09-30 NOTE — Telephone Encounter (Signed)
Spoke with patient, informed him of results. Let patient know that a scheduler will call him to get him an appointment with Dr.Berry to discuss further action. Patient voiced understanding

## 2014-10-02 ENCOUNTER — Telehealth: Payer: Self-pay | Admitting: Cardiovascular Disease

## 2014-10-02 NOTE — Telephone Encounter (Signed)
Closed encounter °

## 2014-10-03 ENCOUNTER — Ambulatory Visit (INDEPENDENT_AMBULATORY_CARE_PROVIDER_SITE_OTHER): Payer: Medicare Other | Admitting: Cardiovascular Disease

## 2014-10-03 ENCOUNTER — Encounter: Payer: Self-pay | Admitting: Cardiovascular Disease

## 2014-10-03 VITALS — BP 136/66 | HR 88 | Ht 66.0 in | Wt 173.0 lb

## 2014-10-03 DIAGNOSIS — R0989 Other specified symptoms and signs involving the circulatory and respiratory systems: Secondary | ICD-10-CM | POA: Diagnosis not present

## 2014-10-03 DIAGNOSIS — I6529 Occlusion and stenosis of unspecified carotid artery: Secondary | ICD-10-CM

## 2014-10-03 NOTE — Assessment & Plan Note (Signed)
Recent carotid Dopplers performed in our office 09/24/14 showed severe right and moderately severe left ICA stenosis. There is a question of a "string sign". The patient is neurologically asymptomatic. He has a low risk Myoview stress test performed 10/23/12 with a remote history of CAD. He denies chest pain. He also has chronic renal insufficiency with a creatinine that runs in the mid 2 range. Based on this, I do not think he is a good carotid stent candidate rather probably should have right carotid endarterectomy with continue surveillance on the left. I'm going to refer him to Dr. Trula Slade for surgical consultation.

## 2014-10-03 NOTE — Progress Notes (Signed)
10/03/2014 Douglas Mow Sr.   1941/01/10  [id number]  Primary Physician TODD,JEFFREY Zenia Resides, MD Primary Cardiologist: Lorretta Harp MD Renae Gloss   HPI:  Douglas Meyer  Is a 74 year old married Caucasian male father of 90, grandfather of 3 grandchildren who is retired. Her specialist. His cardiologist is Dr. Percival Spanish and  his primary care doctor is Dr. Sherren Mocha. He has a remote history of CAD dating back to the 90s. He had a low risk Myoview performed 10/23/12 unchanged from his prior study performed in 2006. The problems include history of diabetes, hypertension and hyperlipidemia. He has chronic renal insufficiency with creatinines that run in the mid 2 range. He had carotid Dopplers performed on 09/26/14 that showed severe right and moderately severe left ICA stenosis. He is neurologically astigmatic. I believe he would benefit from revascularization of his right internal carotid artery and I suspect he should undergo endarterectomy given the severity of the disease by Ultrasound and his chronic renal insufficiency.   Current Outpatient Prescriptions  Medication Sig Dispense Refill  . ACCU-CHEK AVIVA PLUS test strip USE ONE STRIP ONCE DAILY 100 each 5  . allopurinol (ZYLOPRIM) 300 MG tablet Take 1 tablet (300 mg total) by mouth daily. 100 tablet 3  . Ascorbic Acid (VITAMIN C) 500 MG tablet Take 500 mg by mouth daily.      Marland Kitchen aspirin 325 MG tablet Take 325 mg by mouth daily.      Marland Kitchen atorvastatin (LIPITOR) 20 MG tablet TAKE 1 BY MOUTH DAILY. NEEDS TO SCHEDULE APPT (Patient taking differently: TAKE 1 BY MOUTH DAILY.) 30 tablet 0  . BD PEN NEEDLE NANO U/F 32G X 4 MM MISC USE ONE  ONCE DAILY 100 each 0  . calcitRIOL (ROCALTROL) 0.5 MCG capsule Take 0.5 mcg by mouth every other day.     . furosemide (LASIX) 80 MG tablet Take 1 tablet (80 mg total) by mouth daily. 90 tablet 3  . glipiZIDE (GLUCOTROL) 10 MG tablet One tablet daily 100 tablet 3  . Insulin Glargine (LANTUS SOLOSTAR) 100  UNIT/ML Solostar Pen INJECT 39 UNITS INTO THE SKIN AT BEDTIME 15 mL 6  . Lancets (ACCU-CHEK MULTICLIX) lancets 1 each by Other route daily. Dx 250.01 102 each 3  . metoprolol (TOPROL-XL) 200 MG 24 hr tablet TAKE ONE-HALF TABLET BY MOUTH EVERY DAY 100 tablet 3  . Multiple Vitamins-Minerals (MULTIVITAMIN WITH MINERALS) tablet Take 1 tablet by mouth daily.      . nitroGLYCERIN (NITRODUR - DOSED IN MG/24 HR) 0.4 mg/hr patch Place 1 patch (0.4 mg total) onto the skin as needed. 30 patch 1  . trandolapril (MAVIK) 4 MG tablet Take 4 mg by mouth daily.     No current facility-administered medications for this visit.    No Known Allergies  History   Social History  . Marital Status: Married    Spouse Name: N/A  . Number of Children: 4  . Years of Education: N/A   Occupational History  .  [employer]    retired  .     Social History Main Topics  . Smoking status: Former Research scientist (life sciences)  . Smokeless tobacco: Not on file  . Alcohol Use: Yes  . Drug Use: No  . Sexual Activity: Not on file   Other Topics Concern  . Not on file   Social History Narrative   Lives with wife.       Review of Systems: General: negative for chills, fever, night sweats or weight changes.  Cardiovascular:  negative for chest pain, dyspnea on exertion, edema, orthopnea, palpitations, paroxysmal nocturnal dyspnea or shortness of breath Dermatological: negative for rash Respiratory: negative for cough or wheezing Urologic: negative for hematuria Abdominal: negative for nausea, vomiting, diarrhea, bright red blood per rectum, melena, or hematemesis Neurologic: negative for visual changes, syncope, or dizziness All other systems reviewed and are otherwise negative except as noted above.    Blood pressure 136/66, pulse 88, height 5\' 6"  (1.676 m), weight 173 lb (78.472 kg).  General appearance: alert and no distress Neck: no adenopathy, no JVD, supple, symmetrical, trachea midline, thyroid not enlarged, symmetric, no  tenderness/mass/nodules and right carotid bruit Lungs: clear to auscultation bilaterally Heart: regular rate and rhythm, S1, S2 normal, no murmur, click, rub or gallop Extremities: extremities normal, atraumatic, no cyanosis or edema  EKG not performed today  ASSESSMENT AND PLAN:   Carotid bruit Recent carotid Dopplers performed in our office 09/24/14 showed severe right and moderately severe left ICA stenosis. There is a question of a "string sign". The patient is neurologically asymptomatic. He has a low risk Myoview stress test performed 10/23/12 with a remote history of CAD. He denies chest pain. He also has chronic renal insufficiency with a creatinine that runs in the mid 2 range. Based on this, I do not think he is a good carotid stent candidate rather probably should have right carotid endarterectomy with continue surveillance on the left. I'm going to refer him to Dr. Trula Slade for surgical consultation.       Lorretta Harp MD FACP,FACC,FAHA, [facility] 10/03/2014 4:23 PM

## 2014-10-03 NOTE — Patient Instructions (Signed)
  We will see you back in follow up as needed.   Dr Gwenlyn Found has referred you to Dr Trula Slade for carotid evaluation.

## 2014-10-04 ENCOUNTER — Other Ambulatory Visit: Payer: Self-pay | Admitting: Family Medicine

## 2014-10-15 ENCOUNTER — Other Ambulatory Visit: Payer: Self-pay

## 2014-10-15 DIAGNOSIS — I6521 Occlusion and stenosis of right carotid artery: Secondary | ICD-10-CM

## 2014-10-17 ENCOUNTER — Encounter: Payer: Self-pay | Admitting: Surgery

## 2014-10-20 ENCOUNTER — Ambulatory Visit (INDEPENDENT_AMBULATORY_CARE_PROVIDER_SITE_OTHER): Payer: Medicare Other | Admitting: Surgery

## 2014-10-20 ENCOUNTER — Encounter: Payer: Self-pay | Admitting: Surgery

## 2014-10-20 ENCOUNTER — Ambulatory Visit (HOSPITAL_COMMUNITY)
Admission: RE | Admit: 2014-10-20 | Discharge: 2014-10-20 | Disposition: A | Discharge status: Home / Self Care | Payer: Medicare Other | Source: Ambulatory Visit | Attending: Surgery | Admitting: Surgery

## 2014-10-20 VITALS — BP 126/73 | HR 69 | Ht 66.0 in | Wt 174.3 lb

## 2014-10-20 DIAGNOSIS — I6523 Occlusion and stenosis of bilateral carotid arteries: Secondary | ICD-10-CM | POA: Diagnosis not present

## 2014-10-20 DIAGNOSIS — I6521 Occlusion and stenosis of right carotid artery: Secondary | ICD-10-CM

## 2014-10-20 NOTE — Progress Notes (Signed)
Patient name: Douglas Santillanes Sr. MRN: [id number] DOB: 02-17-1941 Sex: male   Referred by: Dr Gwenlyn Found  Reason for referral:  Chief Complaint  Patient presents with  . New Evaluation    eval RICA    HISTORY OF PRESENT ILLNESS: This is a 74 year old gentleman who comes in today for evaluation of carotid stenosis.  This was initially detected on physical examination when a bruit was heard.  The patient was sent for carotid Doppler studies which revealed bilateral stenosis.  The patient is asymptomatic.  Specifically he denies numbness or weakness in either extremity.  He denies slurred speech.  He denies amaurosis fugax.  The patient suffers from chronic renal insufficiency.  His creatinine is around 2.  He has a history of myocardial infarction.  He is followed by Dr Percival Spanish.  He had a low-risk Myoview on 10/23/2012.  He suffers from hypercholesterolemia which is managed with a statin.  He has a history of smoking.  Past Medical History  Diagnosis Date  . CAD (coronary artery disease) 05/15/10    out of hospital myocardial infarction in the 1990's. this was picked up on an  EKG in 1997.  Cardiac catherterization demonstrated an occluded vessel and collateras.  I have no description of this.  A stress  perfusion study  done 2008 demonstrated an ejection fraction  of  53% with inferolateral  hypokinesis.  There was an inferolateral defect with  scar and some mild mixed ischemia.   managed rx  . Diabetes mellitus     DM, type 1  . Hyperlipidemia   . Hypertension   . PVD (peripheral vascular disease)   . ED (erectile dysfunction)   . CKD (chronic kidney disease), stage IV   . Nephrolithiasis   . Gout   . Polyp of colon   . Hx of cardiovascular stress test     a. Lexiscan myoview 10/18/12: EF 53%, inf and inf-lat scar, slight peri-infarct ischemia, small area of apical ischemia; no change from 2006  . Bilateral carotid artery disease   . Myocardial infarction   . Cancer     skin     Past Surgical History  Procedure Laterality Date  . Humerus fracture surgery      left  . Mohs surgery      multiple    History   Social History  . Marital Status: Married    Spouse Name: N/A  . Number of Children: 4  . Years of Education: N/A   Occupational History  .  [employer]    retired  .     Social History Main Topics  . Smoking status: Former Smoker    Quit date: 06/06/1973  . Smokeless tobacco: Not on file  . Alcohol Use: 4.2 oz/week    5 Glasses of wine, 2 Cans of beer per week  . Drug Use: No  . Sexual Activity: Not on file   Other Topics Concern  . Not on file   Social History Narrative   Lives with wife.      Family History  Problem Relation Age of Onset  . Heart attack Father     deceased 68  . Diabetes type II Brother   . Diabetes type II Brother   . Diabetes Brother     rheumatic fever, peptic ulcer disease, deceased  . Diabetes Sister     deceased 85  . Heart attack Mother     deceased age 47s    Allergies as of  10/20/2014  . (No Known Allergies)    Current Outpatient Prescriptions on File Prior to Visit  Medication Sig Dispense Refill  . allopurinol (ZYLOPRIM) 300 MG tablet Take 1 tablet (300 mg total) by mouth daily. 100 tablet 3  . Ascorbic Acid (VITAMIN C) 500 MG tablet Take 500 mg by mouth daily.      Marland Kitchen aspirin 325 MG tablet Take 325 mg by mouth daily.      . calcitRIOL (ROCALTROL) 0.5 MCG capsule Take 0.5 mcg by mouth every other day.     . furosemide (LASIX) 80 MG tablet Take 1 tablet (80 mg total) by mouth daily. 90 tablet 3  . glipiZIDE (GLUCOTROL) 10 MG tablet One tablet daily 100 tablet 3  . Insulin Glargine (LANTUS SOLOSTAR) 100 UNIT/ML Solostar Pen INJECT 39 UNITS INTO THE SKIN AT BEDTIME 15 mL 6  . metoprolol (TOPROL-XL) 200 MG 24 hr tablet TAKE ONE-HALF TABLET BY MOUTH EVERY DAY 100 tablet 3  . Multiple Vitamins-Minerals (MULTIVITAMIN WITH MINERALS) tablet Take 1 tablet by mouth daily.      . trandolapril (MAVIK) 4  MG tablet Take 4 mg by mouth 2 (two) times daily.     Marland Kitchen ACCU-CHEK AVIVA PLUS test strip USE ONE STRIP ONCE DAILY (Patient not taking: Reported on 10/20/2014) 100 each 5  . atorvastatin (LIPITOR) 20 MG tablet TAKE 1 BY MOUTH DAILY. NEEDS TO SCHEDULE APPT (Patient not taking: Reported on 10/20/2014) 30 tablet 0  . BD PEN NEEDLE NANO U/F 32G X 4 MM MISC USE ONCE DAILY (Patient not taking: Reported on 10/20/2014) 100 each 3  . Lancets (ACCU-CHEK MULTICLIX) lancets 1 each by Other route daily. Dx 250.01 (Patient not taking: Reported on 10/20/2014) 102 each 3  . nitroGLYCERIN (NITRODUR - DOSED IN MG/24 HR) 0.4 mg/hr patch Place 1 patch (0.4 mg total) onto the skin as needed. (Patient not taking: Reported on 10/20/2014) 30 patch 1   No current facility-administered medications on file prior to visit.     REVIEW OF SYSTEMS: Cardiovascular: No chest pain, chest pressure, palpitations, orthopnea, or dyspnea on exertion. Positive for leg pain  No history of DVT or phlebitis. Pulmonary: No productive cough, asthma or wheezing. Neurologic: No weakness, paresthesias, aphasia, or amaurosis. No dizziness. Hematologic: No bleeding problems or clotting disorders. Musculoskeletal: No joint pain or joint swelling. Gastrointestinal: No blood in stool or hematemesis Genitourinary: No dysuria or hematuria. Psychiatric:: No history of major depression. Integumentary: No rashes or ulcers. Constitutional: No fever or chills.  PHYSICAL EXAMINATION:  Filed Vitals:   10/20/14 1543 10/20/14 1545  BP: 143/71 126/73  Pulse: 69   Height: 5\' 6"  (1.676 m)   Weight: 174 lb 4.8 oz (79.062 kg)   SpO2: 99%    Body mass index is 28.15 kg/(m^2). General: The patient appears their stated age.   HEENT:  No gross abnormalities Pulmonary: Respirations are non-labored Abdomen: Soft and non-tender.  No pulsatile mass  Musculoskeletal: There are no major deformities.   Neurologic: No focal weakness or paresthesias are  detected, Skin: There are no ulcer or rashes noted. Psychiatric: The patient has normal affect. Cardiovascular: There is a regular rate and rhythm without significant murmur appreciated.positive right carotid bruit  Diagnostic Studies: Carotid Dopplers show 80-99 percent right carotid stenosis and 60-79 percent left carotid stenosis with possible string sign   Assessment:  Asymptomatic bilateral carotid stenosis, right greater than left Plan: I discussed her treatment options.  Because of his renal insufficiency, I do not think he is  a good candidate for carotid stenting.  I did discuss at length the possibility of right carotid endarterectomy.  We discussed the risks and benefits including the risk of stroke and nerve injury.  I also spent an extensive amount of time discussing the possibility of participation in theCREST 2 trial.  Berneda Rose is going to contact him.  For further discussions.  If he is not a candidate for the trial I will have him scheduled for right carotid endarterectomy.     Eldridge Abrahams, M.D. [facility] Office: [phone] Pager:  [phone]

## 2014-10-21 ENCOUNTER — Telehealth: Payer: Self-pay | Admitting: *Deleted

## 2014-10-21 NOTE — Telephone Encounter (Signed)
Called Douglas Meyer to discuss the CREST 2 research trial. The trial was explained to the patient and trial website link was emailed to the patient. He is going to review with his wife and I will follow-up with the patient tomorrow afternoon.

## 2014-10-22 ENCOUNTER — Other Ambulatory Visit: Payer: Self-pay

## 2014-10-22 MED ORDER — ATORVASTATIN CALCIUM 20 MG PO TABS: 20.0000 mg | ORAL_TABLET | Freq: Every day | ORAL | Status: DC

## 2014-11-07 ENCOUNTER — Other Ambulatory Visit: Payer: Self-pay

## 2014-11-20 ENCOUNTER — Encounter (HOSPITAL_COMMUNITY): Payer: Self-pay

## 2014-11-20 ENCOUNTER — Encounter (HOSPITAL_COMMUNITY)
Admission: RE | Admit: 2014-11-20 | Discharge: 2014-11-20 | Disposition: A | Discharge status: Home / Self Care | Payer: Medicare Other | Source: Ambulatory Visit | Attending: Surgery | Admitting: Surgery

## 2014-11-20 DIAGNOSIS — Z0183 Encounter for blood typing: Secondary | ICD-10-CM | POA: Insufficient documentation

## 2014-11-20 DIAGNOSIS — Z7982 Long term (current) use of aspirin: Secondary | ICD-10-CM | POA: Insufficient documentation

## 2014-11-20 DIAGNOSIS — I252 Old myocardial infarction: Secondary | ICD-10-CM | POA: Diagnosis not present

## 2014-11-20 DIAGNOSIS — Z01818 Encounter for other preprocedural examination: Secondary | ICD-10-CM | POA: Diagnosis not present

## 2014-11-20 DIAGNOSIS — E1122 Type 2 diabetes mellitus with diabetic chronic kidney disease: Secondary | ICD-10-CM | POA: Diagnosis not present

## 2014-11-20 DIAGNOSIS — I2510 Atherosclerotic heart disease of native coronary artery without angina pectoris: Secondary | ICD-10-CM | POA: Insufficient documentation

## 2014-11-20 DIAGNOSIS — N184 Chronic kidney disease, stage 4 (severe): Secondary | ICD-10-CM | POA: Insufficient documentation

## 2014-11-20 DIAGNOSIS — Z794 Long term (current) use of insulin: Secondary | ICD-10-CM | POA: Diagnosis not present

## 2014-11-20 DIAGNOSIS — I129 Hypertensive chronic kidney disease with stage 1 through stage 4 chronic kidney disease, or unspecified chronic kidney disease: Secondary | ICD-10-CM | POA: Diagnosis not present

## 2014-11-20 DIAGNOSIS — Z87891 Personal history of nicotine dependence: Secondary | ICD-10-CM | POA: Insufficient documentation

## 2014-11-20 DIAGNOSIS — Z01812 Encounter for preprocedural laboratory examination: Secondary | ICD-10-CM | POA: Insufficient documentation

## 2014-11-20 DIAGNOSIS — Z79899 Other long term (current) drug therapy: Secondary | ICD-10-CM | POA: Insufficient documentation

## 2014-11-20 DIAGNOSIS — I6521 Occlusion and stenosis of right carotid artery: Secondary | ICD-10-CM | POA: Insufficient documentation

## 2014-11-20 HISTORY — DX: Personal history of urinary calculi: Z87.442

## 2014-11-20 LAB — TYPE AND SCREEN
ABO/RH(D): O NEG
Antibody Screen: NEGATIVE

## 2014-11-20 LAB — COMPREHENSIVE METABOLIC PANEL
ALK PHOS: 90 U/L (ref 38–126)
ALT: 21 U/L (ref 17–63)
ANION GAP: 11 (ref 5–15)
AST: 18 U/L (ref 15–41)
Albumin: 3.9 g/dL (ref 3.5–5.0)
BILIRUBIN TOTAL: 0.9 mg/dL (ref 0.3–1.2)
BUN: 74 mg/dL — ABNORMAL HIGH (ref 6–20)
CHLORIDE: 104 mmol/L (ref 101–111)
CO2: 23 mmol/L (ref 22–32)
CREATININE: 3.52 mg/dL — AB (ref 0.61–1.24)
Calcium: 9.3 mg/dL (ref 8.9–10.3)
GFR calc non Af Amer: 16 mL/min — ABNORMAL LOW (ref >60)
GFR, EST AFRICAN AMERICAN: 18 mL/min — AB (ref >60)
Glucose, Bld: 191 mg/dL — ABNORMAL HIGH (ref 65–99)
Potassium: 4 mmol/L (ref 3.5–5.1)
Sodium: 138 mmol/L (ref 135–145)
Total Protein: 7.4 g/dL (ref 6.5–8.1)

## 2014-11-20 LAB — URINALYSIS, ROUTINE W REFLEX MICROSCOPIC
Bilirubin Urine: NEGATIVE
Glucose, UA: NEGATIVE mg/dL
Hgb urine dipstick: NEGATIVE
Ketones, ur: NEGATIVE mg/dL
LEUKOCYTES UA: NEGATIVE
NITRITE: NEGATIVE
PH: 5 (ref 5.0–8.0)
Protein, ur: 30 mg/dL — AB
SPECIFIC GRAVITY, URINE: 1.009 (ref 1.005–1.030)
UROBILINOGEN UA: 0.2 mg/dL (ref 0.0–1.0)

## 2014-11-20 LAB — CBC
HEMATOCRIT: 42.1 % (ref 39.0–52.0)
Hemoglobin: 14 g/dL (ref 13.0–17.0)
MCH: 30.5 pg (ref 26.0–34.0)
MCHC: 33.3 g/dL (ref 30.0–36.0)
MCV: 91.7 fL (ref 78.0–100.0)
Platelets: 188 10*3/uL (ref 150–400)
RBC: 4.59 MIL/uL (ref 4.22–5.81)
RDW: 14.1 % (ref 11.5–15.5)
WBC: 8.2 10*3/uL (ref 4.0–10.5)

## 2014-11-20 LAB — URINE MICROSCOPIC-ADD ON

## 2014-11-20 LAB — PROTIME-INR
INR: 1.07 (ref 0.00–1.49)
PROTHROMBIN TIME: 14.1 s (ref 11.6–15.2)

## 2014-11-20 LAB — ABO/RH: ABO/RH(D): O NEG

## 2014-11-20 LAB — SURGICAL PCR SCREEN
MRSA, PCR: NEGATIVE
Staphylococcus aureus: NEGATIVE

## 2014-11-20 LAB — GLUCOSE, CAPILLARY: Glucose-Capillary: 265 mg/dL — ABNORMAL HIGH (ref 65–99)

## 2014-11-20 LAB — APTT: aPTT: 36 seconds (ref 24–37)

## 2014-11-20 NOTE — Pre-Procedure Instructions (Signed)
    Trayveon Beckford Sr.  11/20/2014    Your procedure is scheduled on Friday, June 24.  Report to [facility] Admitting at 8:30A.M.               Your surgery is scheduled for: 10:40am   Call this number if you have problems the morning of surgery :[phone]               For any other questions, please call [phone], Monday - Friday 8 AM - 4 PM.   Remember:  Do not eat food or drink liquids after midnight : Thursday, June 23.  Take these medicines the morning of surgery with A SIP OF WATER :allopurinol (ZYLOPRIM).                    Take if needed: Nitroglycerin.                On Friday, June 17 stop taking Vitamins     Do not wear jewelry, make-up or nail polish.  Do not wear lotions, powders, or perfumes.    Men may shave face and neck.  Do not bring valuables to the hospital.  [facility] is not responsible for any belongings or valuables.  Contacts, dentures or bridgework may not be worn into surgery.  Leave your suitcase in the car.  After surgery it may be brought to your room.  For patients admitted to the hospital, discharge time will be determined by your treatment team.  Patients discharged the day of surgery will not be allowed to drive home.   Name and phone number of your driver:   -  Special instructions:  Review  [facility] - Preparing For Surgery.  Please read over the following fact sheets that you were given. Pain Booklet, Coughing and Deep Breathing, Blood Transfusion Information and Surgical Site Infection Prevention, How to Manage Your Diabetes Before and After Surgery and What Do I Do About My Diabetes Medications?

## 2014-11-20 NOTE — Progress Notes (Signed)
Dr Sherren Mocha is patients PCP , he also manages his diabetes.  Patient reports that fasting CBG's run 100.  Mr Benney reported that he was instructed by Dr Stephens Shire office to take 50% of Lantus the evening prior to surgery.

## 2014-11-21 LAB — HEMOGLOBIN A1C
HEMOGLOBIN A1C: 8.3 % — AB (ref 4.8–5.6)
MEAN PLASMA GLUCOSE: 192 mg/dL

## 2014-11-21 NOTE — Progress Notes (Signed)
Notified Dr. Trula Slade of Mr. Heesch BUN/Creatinine results; acknowledged. No new orders given. Will follow-up with Dr. Florentina Addison office next Monday, 11/24/14, for any additional nephrology recommendations.

## 2014-11-21 NOTE — Progress Notes (Addendum)
Anesthesia Chart Review: Patient is a 74 year old male scheduled for right CEA on 11/28/14 by Dr. Trula Slade.  History includes former smoker, CAD/MI '90's (reported with occluded vessel with collateral flow but Dr. Percival Spanish does not have copy of this report), PAD, ED, HTN, nephrolithiasis, CKD stage IV, carotid occlusive disease, DM2, skin cancer. PCP is Dr. Stevie Kern. Primary cardiologist is Dr. Percival Spanish. He was referred to [facility] for carotid occlusive disease management by cardiologist Dr. Gwenlyn Found.  Patient was not felt to be an ideal candidate for carotid stent due to his CKD. Nephrologist is Dr. Jimmy Footman.  Meds include allopurinol, amlodipine, ASA 325mg , Lipitor, Rocaltrol, Lasix, glipizide, Lantus, Toprol XL, Nitro, trandolapril.  09/23/14 EKG ([facility]): NSR, lateral infarct (age undetermined), inferior infarct (age undetermined).  09/26/14 Echo: - Left ventricle: The cavity size was normal. There was mild focal basal hypertrophy of the septum. Systolic function was normal. The estimated ejection fraction was in the range of 55% to 60%. Wall motion was normal; there were no regional wall motion abnormalities. Doppler parameters are consistent with abnormal left ventricular relaxation (grade 1 diastolic dysfunction). There was no evidence of elevated ventricular filling pressure by Doppler parameters. - Aortic valve: Trileaflet; normal thickness leaflets. There was no regurgitation. - Mitral valve: Structurally normal valve. There was no regurgitation. - Left atrium: The atrium was moderately dilated. - Right ventricle: Systolic function was normal. - Right atrium: The atrium was normal in size. - Tricuspid valve: There was mild regurgitation. - Pulmonic valve: There was no regurgitation. - Pulmonary arteries: Systolic pressure was within the normal range. - Inferior vena cava: The vessel was normal in size. - Pericardium, extracardiac: There was no pericardial  effusion.  10/18/12 Nuclear stress test: Overall Impression: This study is unchanged from the prior study of 2006. There is moderate scar affecting/the base/mid inferior wall and the base/mid inferolateral wall. There may be very slight peri-infarct ischemia in this area. There is also a very small area of ischemia at the apical cap. This was also seen in the past. LV Ejection Fraction: 53%. LV Wall Motion: There is mild inferobasilar hypokinesis.  09/26/14 Carotid duplex: Hared and calcified plaque, bilaterally, with shadowing which obscures the ostial LICA. 80-99% RICA stenosis, at least 41-63% LICA stenosis with string-sign flow 1 cm into the vessel, after the shadowing and narrowed mid to distal ICA. Occluded left vertebral artery, age undetermined. (RICA stenosis was confirmed by 10/20/14 duplex.)  Preoperative labs noted. BUN 74, Cr 3.52 (BUN/Cr 45/2.4 on 12/10/13), K 4.0, glucose 191. CBC, PT/PTT WNL. A1C 8.3. T&S done. Patient reported fasting glucose runs ~ 100. Discussed with anesthesiologist Dr. Lissa Hoard. Recommend to ensure patient has had recent follow-up with nephrology and that Dr. Trula Slade is aware of recent BUN/Cr results.  I have requested Dr. Deterding's last office note with labs.  In the interim, I called BUN/Cr results to [facility] RN Colletta Maryland.  She will review with Dr. Trula Slade who is in the office today.  Also she called and spoke with staff at [facility]  Patient was last seen there in 08/2014. Colletta Maryland also left a message for [facility] at Dr. Deterding's office notifying her of recent BUN/Cr results and asked that Dr. Trula Slade be notified if there were any additional pre-operative nephrology recommendations. Although I don't have his most recent comparison labs yet from [facility], I am tentatively ordering an ISTAT8 for the day of surgery re-evaluate since his most recent results show a jump in his Cr from 2.4 to 3.5 since  last year.   [facility] Short Stay  Center/Anesthesiology Phone [phone] 11/21/2014 12:28 PM   Addendum: Colletta Maryland from [facility] spoke with Stanton Kidney at Dr. Deterding's office who spoke with Dr. Jimmy Footman. Pt's cr is at his current baseline and pt can proceed.   Willeen Cass, FNP-BC [facility] Short Stay Surgical Center/Anesthesiology Phone: [phone] 11/24/2014 12:16 PM

## 2014-11-28 ENCOUNTER — Encounter (HOSPITAL_COMMUNITY)
Admission: RE | Disposition: A | Discharge status: Home / Self Care | Payer: Self-pay | Source: Ambulatory Visit | Attending: Surgery

## 2014-11-28 ENCOUNTER — Telehealth: Payer: Self-pay | Admitting: Surgery

## 2014-11-28 ENCOUNTER — Telehealth: Payer: Self-pay | Admitting: Cardiology

## 2014-11-28 ENCOUNTER — Encounter: Payer: Self-pay | Admitting: Cardiology

## 2014-11-28 ENCOUNTER — Inpatient Hospital Stay (HOSPITAL_COMMUNITY)
Admission: RE | Admit: 2014-11-28 | Discharge: 2014-11-29 | DRG: 038 | Disposition: A | Discharge status: Home / Self Care | Payer: Medicare Other | Source: Ambulatory Visit | Attending: Surgery | Admitting: Surgery

## 2014-11-28 ENCOUNTER — Encounter (HOSPITAL_COMMUNITY): Payer: Self-pay | Admitting: *Deleted

## 2014-11-28 ENCOUNTER — Inpatient Hospital Stay (HOSPITAL_COMMUNITY): Payer: Medicare Other | Admitting: Vascular Surgery

## 2014-11-28 ENCOUNTER — Inpatient Hospital Stay (HOSPITAL_COMMUNITY): Payer: Medicare Other | Admitting: Emergency Medicine

## 2014-11-28 DIAGNOSIS — Z87891 Personal history of nicotine dependence: Secondary | ICD-10-CM | POA: Diagnosis not present

## 2014-11-28 DIAGNOSIS — I6521 Occlusion and stenosis of right carotid artery: Secondary | ICD-10-CM

## 2014-11-28 DIAGNOSIS — I2510 Atherosclerotic heart disease of native coronary artery without angina pectoris: Secondary | ICD-10-CM | POA: Diagnosis present

## 2014-11-28 DIAGNOSIS — I252 Old myocardial infarction: Secondary | ICD-10-CM

## 2014-11-28 DIAGNOSIS — E1051 Type 1 diabetes mellitus with diabetic peripheral angiopathy without gangrene: Secondary | ICD-10-CM | POA: Diagnosis present

## 2014-11-28 DIAGNOSIS — I6523 Occlusion and stenosis of bilateral carotid arteries: Principal | ICD-10-CM | POA: Diagnosis present

## 2014-11-28 DIAGNOSIS — Z794 Long term (current) use of insulin: Secondary | ICD-10-CM

## 2014-11-28 DIAGNOSIS — Z7982 Long term (current) use of aspirin: Secondary | ICD-10-CM

## 2014-11-28 DIAGNOSIS — E1022 Type 1 diabetes mellitus with diabetic chronic kidney disease: Secondary | ICD-10-CM | POA: Diagnosis present

## 2014-11-28 DIAGNOSIS — E785 Hyperlipidemia, unspecified: Secondary | ICD-10-CM | POA: Diagnosis present

## 2014-11-28 DIAGNOSIS — I129 Hypertensive chronic kidney disease with stage 1 through stage 4 chronic kidney disease, or unspecified chronic kidney disease: Secondary | ICD-10-CM | POA: Diagnosis present

## 2014-11-28 DIAGNOSIS — N184 Chronic kidney disease, stage 4 (severe): Secondary | ICD-10-CM | POA: Diagnosis present

## 2014-11-28 HISTORY — PX: PATCH ANGIOPLASTY: SHX6230

## 2014-11-28 HISTORY — PX: ENDARTERECTOMY: SHX5162

## 2014-11-28 LAB — POCT I-STAT 4, (NA,K, GLUC, HGB,HCT)
Glucose, Bld: 97 mg/dL (ref 65–99)
HEMATOCRIT: 39 % (ref 39.0–52.0)
HEMOGLOBIN: 13.3 g/dL (ref 13.0–17.0)
Potassium: 4 mmol/L (ref 3.5–5.1)
SODIUM: 141 mmol/L (ref 135–145)

## 2014-11-28 LAB — GLUCOSE, CAPILLARY
GLUCOSE-CAPILLARY: 95 mg/dL (ref 65–99)
GLUCOSE-CAPILLARY: 90 mg/dL (ref 65–99)
Glucose-Capillary: 133 mg/dL — ABNORMAL HIGH (ref 65–99)
Glucose-Capillary: 162 mg/dL — ABNORMAL HIGH (ref 65–99)
Glucose-Capillary: 250 mg/dL — ABNORMAL HIGH (ref 65–99)

## 2014-11-28 LAB — CBC
HEMATOCRIT: 36.2 % — AB (ref 39.0–52.0)
Hemoglobin: 12.2 g/dL — ABNORMAL LOW (ref 13.0–17.0)
MCH: 30.2 pg (ref 26.0–34.0)
MCHC: 33.7 g/dL (ref 30.0–36.0)
MCV: 89.6 fL (ref 78.0–100.0)
Platelets: 165 10*3/uL (ref 150–400)
RBC: 4.04 MIL/uL — AB (ref 4.22–5.81)
RDW: 13.9 % (ref 11.5–15.5)
WBC: 11.3 10*3/uL — ABNORMAL HIGH (ref 4.0–10.5)

## 2014-11-28 LAB — POCT I-STAT, CHEM 8
BUN: 78 mg/dL — AB (ref 6–20)
Calcium, Ion: 1.11 mmol/L — ABNORMAL LOW (ref 1.13–1.30)
Chloride: 107 mmol/L (ref 101–111)
Creatinine, Ser: 3.8 mg/dL — ABNORMAL HIGH (ref 0.61–1.24)
GLUCOSE: 95 mg/dL (ref 65–99)
HCT: 39 % (ref 39.0–52.0)
Hemoglobin: 13.3 g/dL (ref 13.0–17.0)
POTASSIUM: 3.9 mmol/L (ref 3.5–5.1)
SODIUM: 141 mmol/L (ref 135–145)
TCO2: 20 mmol/L (ref 0–100)

## 2014-11-28 LAB — CREATININE, SERUM
CREATININE: 3.35 mg/dL — AB (ref 0.61–1.24)
GFR calc Af Amer: 19 mL/min — ABNORMAL LOW (ref >60)
GFR, EST NON AFRICAN AMERICAN: 17 mL/min — AB (ref >60)

## 2014-11-28 SURGERY — ENDARTERECTOMY, CAROTID
Anesthesia: General | Site: Neck | Laterality: Right

## 2014-11-28 MED ORDER — FUROSEMIDE 80 MG PO TABS
80.0000 mg | ORAL_TABLET | Freq: Every day | ORAL | Status: DC
  Administered 2014-11-28 – 2014-11-29 (×2): 80 mg via ORAL
  Filled 2014-11-28 (×2): qty 1

## 2014-11-28 MED ORDER — ASPIRIN 325 MG PO TABS
325.0000 mg | ORAL_TABLET | Freq: Every day | ORAL | Status: DC
  Administered 2014-11-28 – 2014-11-29 (×2): 325 mg via ORAL
  Filled 2014-11-28 (×3): qty 1

## 2014-11-28 MED ORDER — ROCURONIUM BROMIDE 50 MG/5ML IV SOLN
INTRAVENOUS | Status: AC
  Filled 2014-11-28: qty 2

## 2014-11-28 MED ORDER — MIDAZOLAM HCL 2 MG/2ML IJ SOLN
INTRAMUSCULAR | Status: AC
  Filled 2014-11-28: qty 2

## 2014-11-28 MED ORDER — INSULIN ASPART 100 UNIT/ML ~~LOC~~ SOLN
0.0000 [IU] | Freq: Three times a day (TID) | SUBCUTANEOUS | Status: DC
  Administered 2014-11-29: 3 [IU] via SUBCUTANEOUS

## 2014-11-28 MED ORDER — ACETAMINOPHEN 650 MG RE SUPP: 325.0000 mg | RECTAL | Status: DC | PRN

## 2014-11-28 MED ORDER — ENOXAPARIN SODIUM 30 MG/0.3ML ~~LOC~~ SOLN
30.0000 mg | SUBCUTANEOUS | Status: DC
  Filled 2014-11-28: qty 0.3

## 2014-11-28 MED ORDER — ONDANSETRON HCL 4 MG/2ML IJ SOLN
INTRAMUSCULAR | Status: DC | PRN
  Administered 2014-11-28: 4 mg via INTRAVENOUS

## 2014-11-28 MED ORDER — TRANDOLAPRIL 4 MG PO TABS
4.0000 mg | ORAL_TABLET | Freq: Two times a day (BID) | ORAL | Status: DC
  Administered 2014-11-29: 4 mg via ORAL
  Filled 2014-11-28 (×4): qty 1

## 2014-11-28 MED ORDER — ATORVASTATIN CALCIUM 20 MG PO TABS
20.0000 mg | ORAL_TABLET | Freq: Every day | ORAL | Status: DC
  Administered 2014-11-28: 20 mg via ORAL
  Filled 2014-11-28 (×2): qty 1

## 2014-11-28 MED ORDER — LIDOCAINE HCL (PF) 1 % IJ SOLN
INTRAMUSCULAR | Status: AC
  Filled 2014-11-28: qty 30

## 2014-11-28 MED ORDER — PHENOL 1.4 % MT LIQD: 1.0000 | OROMUCOSAL | Status: DC | PRN

## 2014-11-28 MED ORDER — DEXTROSE 5 % IV SOLN
INTRAVENOUS | Status: AC
  Filled 2014-11-28: qty 1.5

## 2014-11-28 MED ORDER — CHLORHEXIDINE GLUCONATE CLOTH 2 % EX PADS: 6.0000 | MEDICATED_PAD | Freq: Once | CUTANEOUS | Status: DC

## 2014-11-28 MED ORDER — EPHEDRINE SULFATE 50 MG/ML IJ SOLN
INTRAMUSCULAR | Status: DC | PRN
  Administered 2014-11-28 (×2): 5 mg via INTRAVENOUS

## 2014-11-28 MED ORDER — SODIUM CHLORIDE 0.9 % IV SOLN
INTRAVENOUS | Status: DC
Start: 2014-11-28 — End: 2014-11-29
  Administered 2014-11-28 (×2): via INTRAVENOUS

## 2014-11-28 MED ORDER — SODIUM CHLORIDE 0.9 % IV SOLN: 500.0000 mL | Freq: Once | INTRAVENOUS | Status: AC | PRN

## 2014-11-28 MED ORDER — ADULT MULTIVITAMIN W/MINERALS CH
1.0000 | ORAL_TABLET | Freq: Every day | ORAL | Status: DC
  Administered 2014-11-29: 1 via ORAL
  Filled 2014-11-28: qty 1

## 2014-11-28 MED ORDER — HEPARIN SODIUM (PORCINE) 1000 UNIT/ML IJ SOLN
INTRAMUSCULAR | Status: DC | PRN
  Administered 2014-11-28: 7000 [IU] via INTRAVENOUS
  Administered 2014-11-28: 2000 [IU] via INTRAVENOUS

## 2014-11-28 MED ORDER — FENTANYL CITRATE (PF) 250 MCG/5ML IJ SOLN
INTRAMUSCULAR | Status: AC
  Filled 2014-11-28: qty 5

## 2014-11-28 MED ORDER — LIDOCAINE HCL (PF) 1 % IJ SOLN
INTRAMUSCULAR | Status: DC | PRN
  Administered 2014-11-28: 1 mL

## 2014-11-28 MED ORDER — CEFUROXIME SODIUM 1.5 G IJ SOLR
1.5000 g | Freq: Once | INTRAMUSCULAR | Status: AC
  Administered 2014-11-29: 1.5 g via INTRAVENOUS
  Filled 2014-11-28 (×3): qty 1.5

## 2014-11-28 MED ORDER — GUAIFENESIN-DM 100-10 MG/5ML PO SYRP
15.0000 mL | ORAL_SOLUTION | ORAL | Status: DC | PRN
Start: 2014-11-28 — End: 2014-11-29

## 2014-11-28 MED ORDER — SODIUM CHLORIDE 0.9 % IR SOLN
Status: DC | PRN
  Administered 2014-11-28: 12:00:00

## 2014-11-28 MED ORDER — 0.9 % SODIUM CHLORIDE (POUR BTL) OPTIME
TOPICAL | Status: DC | PRN
  Administered 2014-11-28: 2000 mL

## 2014-11-28 MED ORDER — LACTATED RINGERS IV SOLN
INTRAVENOUS | Status: DC | PRN
  Administered 2014-11-28: 11:00:00 via INTRAVENOUS

## 2014-11-28 MED ORDER — ROCURONIUM BROMIDE 100 MG/10ML IV SOLN
INTRAVENOUS | Status: DC | PRN
  Administered 2014-11-28: 10 mg via INTRAVENOUS
  Administered 2014-11-28: 40 mg via INTRAVENOUS

## 2014-11-28 MED ORDER — DEXAMETHASONE SODIUM PHOSPHATE 4 MG/ML IJ SOLN
INTRAMUSCULAR | Status: AC
  Filled 2014-11-28: qty 2

## 2014-11-28 MED ORDER — OXYCODONE HCL 5 MG PO TABS: 5.0000 mg | ORAL_TABLET | Freq: Four times a day (QID) | ORAL | Status: DC | PRN

## 2014-11-28 MED ORDER — GLYCOPYRROLATE 0.2 MG/ML IJ SOLN
INTRAMUSCULAR | Status: AC
  Filled 2014-11-28: qty 3

## 2014-11-28 MED ORDER — AMLODIPINE BESYLATE 5 MG PO TABS
5.0000 mg | ORAL_TABLET | Freq: Every day | ORAL | Status: DC
  Administered 2014-11-28 – 2014-11-29 (×2): 5 mg via ORAL
  Filled 2014-11-28 (×2): qty 1

## 2014-11-28 MED ORDER — LIDOCAINE HCL (CARDIAC) 20 MG/ML IV SOLN
INTRAVENOUS | Status: DC | PRN
  Administered 2014-11-28: 60 mg via INTRAVENOUS

## 2014-11-28 MED ORDER — METOPROLOL SUCCINATE ER 100 MG PO TB24
100.0000 mg | ORAL_TABLET | Freq: Every day | ORAL | Status: DC
  Administered 2014-11-28 – 2014-11-29 (×2): 100 mg via ORAL
  Filled 2014-11-28 (×2): qty 1

## 2014-11-28 MED ORDER — OXYCODONE HCL 5 MG PO TABS: 5.0000 mg | ORAL_TABLET | ORAL | Status: DC | PRN

## 2014-11-28 MED ORDER — GLIPIZIDE 10 MG PO TABS
10.0000 mg | ORAL_TABLET | Freq: Every day | ORAL | Status: DC
  Administered 2014-11-28: 10 mg via ORAL
  Filled 2014-11-28 (×2): qty 1

## 2014-11-28 MED ORDER — MULTI-VITAMIN/MINERALS PO TABS: 1.0000 | ORAL_TABLET | Freq: Every day | ORAL | Status: DC

## 2014-11-28 MED ORDER — GLYCOPYRROLATE 0.2 MG/ML IJ SOLN
INTRAMUSCULAR | Status: AC
  Filled 2014-11-28: qty 2

## 2014-11-28 MED ORDER — NEOSTIGMINE METHYLSULFATE 10 MG/10ML IV SOLN
INTRAVENOUS | Status: DC | PRN
  Administered 2014-11-28: 5 mg via INTRAVENOUS

## 2014-11-28 MED ORDER — PHENYLEPHRINE HCL 10 MG/ML IJ SOLN
10.0000 mg | INTRAMUSCULAR | Status: DC | PRN
  Administered 2014-11-28: 20 ug/min via INTRAVENOUS

## 2014-11-28 MED ORDER — ACETAMINOPHEN 325 MG PO TABS: 325.0000 mg | ORAL_TABLET | ORAL | Status: DC | PRN

## 2014-11-28 MED ORDER — MAGNESIUM SULFATE 2 GM/50ML IV SOLN: 2.0000 g | Freq: Every day | INTRAVENOUS | Status: DC | PRN

## 2014-11-28 MED ORDER — SODIUM CHLORIDE 0.9 % IV SOLN
INTRAVENOUS | Status: DC
  Administered 2014-11-28: 10:00:00 via INTRAVENOUS

## 2014-11-28 MED ORDER — HEMOSTATIC AGENTS (NO CHARGE) OPTIME
TOPICAL | Status: DC | PRN
  Administered 2014-11-28: 1 via TOPICAL

## 2014-11-28 MED ORDER — POTASSIUM CHLORIDE CRYS ER 20 MEQ PO TBCR: 20.0000 meq | EXTENDED_RELEASE_TABLET | Freq: Every day | ORAL | Status: DC | PRN

## 2014-11-28 MED ORDER — MORPHINE SULFATE 2 MG/ML IJ SOLN: 2.0000 mg | INTRAMUSCULAR | Status: DC | PRN

## 2014-11-28 MED ORDER — HYDRALAZINE HCL 20 MG/ML IJ SOLN: 5.0000 mg | INTRAMUSCULAR | Status: DC | PRN

## 2014-11-28 MED ORDER — PROPOFOL 10 MG/ML IV BOLUS
INTRAVENOUS | Status: DC | PRN
  Administered 2014-11-28: 150 mg via INTRAVENOUS

## 2014-11-28 MED ORDER — CEFUROXIME SODIUM 1.5 G IJ SOLR
1.5000 g | INTRAMUSCULAR | Status: AC
  Administered 2014-11-28: 1.5 g via INTRAVENOUS

## 2014-11-28 MED ORDER — PANTOPRAZOLE SODIUM 40 MG PO TBEC
40.0000 mg | DELAYED_RELEASE_TABLET | Freq: Every day | ORAL | Status: DC
  Administered 2014-11-28 – 2014-11-29 (×2): 40 mg via ORAL
  Filled 2014-11-28 (×2): qty 1

## 2014-11-28 MED ORDER — DEXAMETHASONE SODIUM PHOSPHATE 4 MG/ML IJ SOLN
INTRAMUSCULAR | Status: DC | PRN
  Administered 2014-11-28: 4 mg via INTRAVENOUS

## 2014-11-28 MED ORDER — ALUM & MAG HYDROXIDE-SIMETH 200-200-20 MG/5ML PO SUSP: 15.0000 mL | ORAL | Status: DC | PRN

## 2014-11-28 MED ORDER — ALLOPURINOL 300 MG PO TABS
300.0000 mg | ORAL_TABLET | Freq: Every day | ORAL | Status: DC
  Administered 2014-11-29: 300 mg via ORAL
  Filled 2014-11-28: qty 1

## 2014-11-28 MED ORDER — METOPROLOL TARTRATE 1 MG/ML IV SOLN: 2.0000 mg | INTRAVENOUS | Status: DC | PRN

## 2014-11-28 MED ORDER — NEOSTIGMINE METHYLSULFATE 10 MG/10ML IV SOLN
INTRAVENOUS | Status: AC
  Filled 2014-11-28: qty 2

## 2014-11-28 MED ORDER — PROTAMINE SULFATE 10 MG/ML IV SOLN
INTRAVENOUS | Status: DC | PRN
  Administered 2014-11-28: 50 mg via INTRAVENOUS
  Administered 2014-11-28: 25 mg via INTRAVENOUS

## 2014-11-28 MED ORDER — GLYCOPYRROLATE 0.2 MG/ML IJ SOLN
INTRAMUSCULAR | Status: DC | PRN
  Administered 2014-11-28: 0.2 mg via INTRAVENOUS
  Administered 2014-11-28: .6 mg via INTRAVENOUS

## 2014-11-28 MED ORDER — CALCITRIOL 0.5 MCG PO CAPS
0.5000 ug | ORAL_CAPSULE | ORAL | Status: DC
  Administered 2014-11-28: 0.5 ug via ORAL
  Filled 2014-11-28: qty 1

## 2014-11-28 MED ORDER — REMIFENTANIL HCL 1 MG IV SOLR
INTRAVENOUS | Status: DC | PRN
  Administered 2014-11-28: .25 ug/kg/min via INTRAVENOUS

## 2014-11-28 MED ORDER — LABETALOL HCL 5 MG/ML IV SOLN: 10.0000 mg | INTRAVENOUS | Status: DC | PRN

## 2014-11-28 MED ORDER — PROPOFOL 10 MG/ML IV BOLUS
INTRAVENOUS | Status: AC
  Filled 2014-11-28: qty 20

## 2014-11-28 MED ORDER — DOCUSATE SODIUM 100 MG PO CAPS
100.0000 mg | ORAL_CAPSULE | Freq: Every day | ORAL | Status: DC
  Administered 2014-11-29: 100 mg via ORAL
  Filled 2014-11-28: qty 1

## 2014-11-28 MED ORDER — DEXTROSE 5 % IV SOLN
1.5000 g | Freq: Two times a day (BID) | INTRAVENOUS | Status: DC
  Filled 2014-11-28: qty 1.5

## 2014-11-28 MED ORDER — ONDANSETRON HCL 4 MG/2ML IJ SOLN: 4.0000 mg | Freq: Four times a day (QID) | INTRAMUSCULAR | Status: DC | PRN

## 2014-11-28 MED ORDER — NITROGLYCERIN 0.4 MG SL SUBL: 0.4000 mg | SUBLINGUAL_TABLET | SUBLINGUAL | Status: DC | PRN

## 2014-11-28 MED ORDER — INSULIN GLARGINE 100 UNIT/ML ~~LOC~~ SOLN
40.0000 [IU] | Freq: Every day | SUBCUTANEOUS | Status: DC
  Administered 2014-11-28: 40 [IU] via SUBCUTANEOUS
  Filled 2014-11-28 (×2): qty 0.4

## 2014-11-28 SURGICAL SUPPLY — 46 items
CANISTER SUCTION 2500CC (MISCELLANEOUS) ×2 IMPLANT
CATH ROBINSON RED A/P 18FR (CATHETERS) ×2 IMPLANT
CATH SUCT 10FR WHISTLE TIP (CATHETERS) ×2 IMPLANT
CLIP TI MEDIUM 6 (CLIP) ×2 IMPLANT
CLIP TI WIDE RED SMALL 6 (CLIP) ×4 IMPLANT
CRADLE DONUT ADULT HEAD (MISCELLANEOUS) ×2 IMPLANT
DRAIN CHANNEL 15F RND FF W/TCR (WOUND CARE) ×2 IMPLANT
ELECT REM PT RETURN 9FT ADLT (ELECTROSURGICAL) ×2
ELECTRODE REM PT RTRN 9FT ADLT (ELECTROSURGICAL) ×1 IMPLANT
EVACUATOR SILICONE 100CC (DRAIN) ×2 IMPLANT
GAUZE SPONGE 4X4 12PLY STRL (GAUZE/BANDAGES/DRESSINGS) ×2 IMPLANT
GLOVE BIO SURGEON STRL SZ 6.5 (GLOVE) ×8 IMPLANT
GLOVE BIOGEL PI IND STRL 6.5 (GLOVE) ×4 IMPLANT
GLOVE BIOGEL PI IND STRL 7.5 (GLOVE) ×1 IMPLANT
GLOVE BIOGEL PI INDICATOR 6.5 (GLOVE) ×4
GLOVE BIOGEL PI INDICATOR 7.5 (GLOVE) ×1
GLOVE SS BIOGEL STRL SZ 7 (GLOVE) ×1 IMPLANT
GLOVE SUPERSENSE BIOGEL SZ 7 (GLOVE) ×1
GLOVE SURG SS PI 7.5 STRL IVOR (GLOVE) ×2 IMPLANT
GOWN STRL REUS W/ TWL LRG LVL3 (GOWN DISPOSABLE) ×2 IMPLANT
GOWN STRL REUS W/ TWL XL LVL3 (GOWN DISPOSABLE) ×1 IMPLANT
GOWN STRL REUS W/TWL LRG LVL3 (GOWN DISPOSABLE) ×2
GOWN STRL REUS W/TWL XL LVL3 (GOWN DISPOSABLE) ×1
HEMOSTAT SNOW SURGICEL 2X4 (HEMOSTASIS) ×2 IMPLANT
INSERT FOGARTY SM (MISCELLANEOUS) IMPLANT
KIT BASIN OR (CUSTOM PROCEDURE TRAY) ×2 IMPLANT
KIT ROOM TURNOVER OR (KITS) ×2 IMPLANT
LIQUID BAND (GAUZE/BANDAGES/DRESSINGS) ×2 IMPLANT
NEEDLE HYPO 25GX1X1/2 BEV (NEEDLE) ×2 IMPLANT
NS IRRIG 1000ML POUR BTL (IV SOLUTION) ×6 IMPLANT
PACK CAROTID (CUSTOM PROCEDURE TRAY) ×2 IMPLANT
PAD ARMBOARD 7.5X6 YLW CONV (MISCELLANEOUS) ×4 IMPLANT
PATCH VASCULAR VASCU GUARD 1X6 (Vascular Products) ×2 IMPLANT
SHUNT CAROTID BYPASS 10 (VASCULAR PRODUCTS) IMPLANT
SHUNT CAROTID BYPASS 12FRX15.5 (VASCULAR PRODUCTS) IMPLANT
SPONGE INTESTINAL PEANUT (DISPOSABLE) ×4 IMPLANT
SUT ETHILON 3 0 PS 1 (SUTURE) ×2 IMPLANT
SUT PROLENE 6 0 BV (SUTURE) ×14 IMPLANT
SUT PROLENE 7 0 BV 1 (SUTURE) IMPLANT
SUT SILK 3 0 TIES 17X18 (SUTURE)
SUT SILK 3-0 18XBRD TIE BLK (SUTURE) IMPLANT
SUT VIC AB 3-0 SH 27 (SUTURE) ×3
SUT VIC AB 3-0 SH 27X BRD (SUTURE) ×3 IMPLANT
SUT VICRYL 4-0 PS2 18IN ABS (SUTURE) ×2 IMPLANT
SYR CONTROL 10ML LL (SYRINGE) ×2 IMPLANT
WATER STERILE IRR 1000ML POUR (IV SOLUTION) ×2 IMPLANT

## 2014-11-28 NOTE — Transfer of Care (Signed)
Immediate Anesthesia Transfer of Care Note  Patient: Douglas Meyer  Procedure(s) Performed: Procedure(s): RIGHT CAROTID ENDARTERECTOMY (Right) PATCH ANGIOPLASTY RIGHT CAROTID (Right)  Patient Location: PACU  Anesthesia Type:General  Level of Consciousness: awake, oriented, sedated, patient cooperative and responds to stimulation  Airway & Oxygen Therapy: Patient Spontanous Breathing and Patient connected to nasal cannula oxygen  Post-op Assessment: Report given to RN, Post -op Vital signs reviewed and stable, Patient moving all extremities and Patient moving all extremities X 4  Post vital signs: Reviewed and stable  Last Vitals:  Filed Vitals:   11/28/14 0825  BP: 158/65  Pulse: 66  Temp: 36.2 C  Resp: 18    Complications: No apparent anesthesia complications

## 2014-11-28 NOTE — Anesthesia Procedure Notes (Signed)
Procedure Name: Intubation Date/Time: 11/28/2014 11:18 AM Performed by: Jacquiline Doe A Pre-anesthesia Checklist: Patient identified, Timeout performed, Emergency Drugs available, Suction available and Patient being monitored Patient Re-evaluated:Patient Re-evaluated prior to inductionOxygen Delivery Method: Circle system utilized Preoxygenation: Pre-oxygenation with 100% oxygen Intubation Type: IV induction and Cricoid Pressure applied Ventilation: Mask ventilation without difficulty Laryngoscope Size: Miller and 2 Grade View: Grade I Tube type: Oral Tube size: 8.0 mm Number of attempts: 1 Airway Equipment and Method: Stylet Placement Confirmation: ETT inserted through vocal cords under direct vision,  breath sounds checked- equal and bilateral and positive ETCO2 Secured at: 23 cm Tube secured with: Tape Dental Injury: Teeth and Oropharynx as per pre-operative assessment  Comments: Airway / Induction per K. Fonda Kinder

## 2014-11-28 NOTE — Anesthesia Preprocedure Evaluation (Addendum)
Anesthesia Evaluation  Patient identified by MRN, date of birth, ID band Patient awake    Reviewed: Allergy & Precautions, NPO status , Patient's Chart, lab work & pertinent test results  Airway Mallampati: II  TM Distance: >3 FB Neck ROM: Full    Dental no notable dental hx.    Pulmonary neg pulmonary ROS, former smoker,  breath sounds clear to auscultation  Pulmonary exam normal       Cardiovascular hypertension, + CAD, + Past MI and + Peripheral Vascular Disease Normal cardiovascular examRhythm:Regular Rate:Normal  Echo 09/2014 Left ventricle: The cavity size was normal. There was mild focalbasal hypertrophy of the septum. Systolic function was normal.The estimated ejection fraction was in the range of 55% to 60%.Wall motion was normal; there were no regional wall motionabnormalities. Doppler parameters are consistent with abnormal left ventricular relaxation (grade 1 diastolic dysfunction). There was no evidence of elevated ventricular filling pressure byDoppler parameters. - Aortic valve: Trileaflet; normal thickness leaflets. There was noregurgitation. - Mitral valve: Structurally normal valve. There was no regurgitation. - Left atrium: The atrium was moderately dilated. - Right ventricle: Systolic function was normal. - Right atrium: The atrium was normal in size. - Tricuspid valve: There was mild regurgitation. - Pulmonic valve: There was no regurgitation. - Pulmonary arteries: Systolic pressure was within the normal range. - Inferior vena cava: The vessel was normal in size. - Pericardium, extracardiac: There was no pericardial effusion   Neuro/Psych negative neurological ROS  negative psych ROS   GI/Hepatic negative GI ROS, Neg liver ROS,   Endo/Other  negative endocrine ROSdiabetes, Type 2, Oral Hypoglycemic Agents, Insulin Dependent  Renal/GU CRF and Renal InsufficiencyRenal disease      Musculoskeletal negative musculoskeletal ROS (+)   Abdominal   Peds  Hematology negative hematology ROS (+)   Anesthesia Other Findings   Reproductive/Obstetrics negative OB ROS                           Anesthesia Physical Anesthesia Plan  ASA: III  Anesthesia Plan: General   Post-op Pain Management:    Induction: Intravenous  Airway Management Planned: Oral ETT  Additional Equipment: Arterial line  Intra-op Plan:   Post-operative Plan: Extubation in OR  Informed Consent: I have reviewed the patients History and Physical, chart, labs and discussed the procedure including the risks, benefits and alternatives for the proposed anesthesia with the patient or authorized representative who has indicated his/her understanding and acceptance.   Dental advisory given  Plan Discussed with: CRNA  Anesthesia Plan Comments:         Anesthesia Quick Evaluation

## 2014-11-28 NOTE — Progress Notes (Signed)
  [facility] Day of Surgery Note  Subjective:  Patient asleep  Filed Vitals:   11/28/14 1441  BP: 118/50  Pulse:   Temp: 97 F (36.1 C)  Resp:    Right neck incision c/d/i. No hematoma. JP drain with minimal output. Per RN, tongue midline, chronic right facial droop, moving all extremities equally.   Assessment/Plan:  This is a 74 y.o. male who is s/p right carotid endartectomy.   Neuro exam intact. Right neck incision without hematoma.  Anticipate d/c in am.   Virgina Jock, PA-C Pager: [phone] 11/28/2014 3:29 PM

## 2014-11-28 NOTE — Op Note (Signed)
Patient name: Emanual Lamountain MRN: [id number] DOB: 1941-05-13 Sex: male  11/28/2014 Pre-operative Diagnosis: asymptomatic   right carotid stenosis Post-operative diagnosis:  Same Surgeon:  [name] Assistants:  Mickeal Needy Procedure:    right carotid Endarterectomy with bovine pericardial  patch angioplasty Anesthesia:  General Blood Loss:  See anesthesia record Specimens:  Carotid Plaque to pathology  Findings:  85 %stenosis; Thrombus:  Within ruptured plaque  Indications:  74 year old found to have a bruit.  Ultrasound shows bilateral high grade stenosis, right > left  Procedure:  The patient was identified in the holding area and taken to [address]  The patient was then placed supine on the table.   General endotrachial anesthesia was administered.  The patient was prepped and draped in the usual sterile fashion.  A time out was called and antibiotics were administered.  The incision was made along the anterior border of the right sternocleidomastoid muscle.  Cautery was used to dissect through the subcutaneous tissue.  The platysma muscle was divided with cautery.  The internal jugular vein was exposed along its anterior medial border.  The common facial vein was exposed and then divided between 2-0 silk ties and metal clips.  The common carotid artery was then circumferentially exposed and encircled with an umbilical tape.  The vagus nerve was identified and protected.  Next sharp dissection was used to expose the external carotid artery and the superior thyroid artery.  The were encircled with a blue vessel loop and a 2-0 silk tie respectively.  Finally, the internal carotid was carefully dissected free.  An umbilical tape was placed around the internal carotid artery distal to the diseased segment.  The hypoglossal nerve was visualized throughout and protected.  The patient was given systemic heparinization.  A bovine carotid patch was selected and prepared on the back  table.  A 10 french shunt was also prepared.  After blood pressure readings were appropriate and the heparin had been given time to circulate, the internal carotid artery was occluded with a baby Gregory clamp.  The external and common carotid arteries were then occluded with vascular clamps and the 2-0 tie tightened on the superior thyroid artery.  A #11 blade was used to make an arteriotomy in the common carotid artery.  This was extended with Potts scissors along the anterior and lateral border of the common and internal carotid artery.  Approximately 85% stenosis was identified.  There was subacute thrombus identified within the plaque.  The 10 french shunt was then placed.  A kleiner kuntz elevator was used to perform endarterectomy.  An eversion endarterectomy was performed in the external carotid artery.  A good distal endpoint was obtained in the internal carotid artery.  The specimen was removed and sent to pathology.  Heparinized saline was used to irrigate the endarterectomized field.  All potential embolic debris was removed.  Bovine pericardial patch angioplasty was then performed using a running 6-0 Prolene. Just prior to completion of the repair, the shunt was removed. The common internal and external carotid arteries were all appropriately flushed. The artery was again irrigated with heparin saline.  The anastomosis was then secured. The clamp was first released on the external carotid artery followed by the common carotid artery approximately 30 seconds later, bloodflow was reestablish through the internal carotid artery.  Next, a hand-held  Doppler was used to evaluate the signals in the common, external, and internal  carotid arteries, all of which had  appropriate signals. I then administered  50 mg protamine. The wound was then irrigated.  After hemostasis was achieved, the carotid sheath was reapproximated with 3-0 Vicryl. The  platysma muscle was reapproximated with running 3-0 Vicryl. The  skin  was closed with 4-0 Vicryl. Dermabond was placed on the skin. The  patient was then successfully extubated. His neurologic exam was  similar to his preprocedural exam. The patient was then taken to recovery room  in stable condition. There were no complications.     Disposition:  To PACU in stable condition.  Relevant Operative Details:  Plaque extended approximaterl 4 cm int the distal ICA.  Excellent back-bleeding.  Full 6cm patch used  V. [name], M.D. [facility] [place] Office: [phone] Pager:  [phone]

## 2014-11-28 NOTE — Progress Notes (Signed)
ABX adjustements:  Post vascular surgery. Baseline scr 3.8 so will adjust post op dose to x1.  Onnie Boer, PharmD Pager: [phone] 11/28/2014 3:32 PM

## 2014-11-28 NOTE — Telephone Encounter (Signed)
-----   Message from Mena Goes, RN sent at 11/28/2014  1:47 PM EDT ----- Regarding: Schedule   ----- Message -----    From: Alvia Grove, PA-C    Sent: 11/28/2014   1:44 PM      To: [facility] Charge Pool  S/p right carotid endarterectomy 11/28/14  F/u with Dr. Trula Slade in 2 weeks  Thanks Maudie Mercury

## 2014-11-28 NOTE — H&P (Signed)
Patient name: Douglas MeyerMRN: [id number] DOB: 10-24-1942Sex: male   Referred by: Dr Gwenlyn Found  Reason for referral:  Chief Complaint  Patient presents with  . New Evaluation    eval RICA    HISTORY OF PRESENT ILLNESS: This is a 74 year old gentleman who comes in today for evaluation of carotid stenosis. This was initially detected on physical examination when a bruit was heard. The patient was sent for carotid Doppler studies which revealed bilateral stenosis. The patient is asymptomatic. Specifically he denies numbness or weakness in either extremity. He denies slurred speech. He denies amaurosis fugax.  The patient suffers from chronic renal insufficiency. His creatinine is around 2. He has a history of myocardial infarction. He is followed by Dr Percival Spanish. He had a low-risk Myoview on 10/23/2012. He suffers from hypercholesterolemia which is managed with a statin. He has a history of smoking.  Past Medical History  Diagnosis Date  . CAD (coronary artery disease) 05/15/10    out of hospital myocardial infarction in the 1990's. this was picked up on an EKG in 1997. Cardiac catherterization demonstrated an occluded vessel and collateras. I have no description of this. A stress perfusion study done 2008 demonstrated an ejection fraction of 53% with inferolateral hypokinesis. There was an inferolateral defect with scar and some mild mixed ischemia. managed rx  . Diabetes mellitus     DM, type 1  . Hyperlipidemia   . Hypertension   . PVD (peripheral vascular disease)   . ED (erectile dysfunction)   . CKD (chronic kidney disease), stage IV   . Nephrolithiasis   . Gout   . Polyp of colon   . Hx of cardiovascular stress test     a. Lexiscan myoview 10/18/12: EF 53%, inf and inf-lat scar, slight peri-infarct ischemia, small area of apical ischemia; no change from 2006  . Bilateral  carotid artery disease   . Myocardial infarction   . Cancer     skin    Past Surgical History  Procedure Laterality Date  . Humerus fracture surgery      left  . Mohs surgery      multiple    History   Social History  . Marital Status: Married    Spouse Name: N/A  . Number of Children: 4  . Years of Education: N/A   Occupational History  .  [employer]    retired  .     Social History Main Topics  . Smoking status: Former Smoker    Quit date: 06/06/1973  . Smokeless tobacco: Not on file  . Alcohol Use: 4.2 oz/week    5 Glasses of wine, 2 Cans of beer per week  . Drug Use: No  . Sexual Activity: Not on file   Other Topics Concern  . Not on file   Social History Narrative   Lives with wife.     Family History  Problem Relation Age of Onset  . Heart attack Father     deceased 80  . Diabetes type II Brother   . Diabetes type II Brother   . Diabetes Brother     rheumatic fever, peptic ulcer disease, deceased  . Diabetes Sister     deceased 72  . Heart attack Mother     deceased age 79s    Allergies as of 10/20/2014  . (No Known Allergies)    Current Outpatient Prescriptions on File Prior to Visit  Medication Sig Dispense Refill  . allopurinol (ZYLOPRIM) 300 MG tablet  Take 1 tablet (300 mg total) by mouth daily. 100 tablet 3  . Ascorbic Acid (VITAMIN C) 500 MG tablet Take 500 mg by mouth daily.     Marland Kitchen aspirin 325 MG tablet Take 325 mg by mouth daily.     . calcitRIOL (ROCALTROL) 0.5 MCG capsule Take 0.5 mcg by mouth every other day.     . furosemide (LASIX) 80 MG tablet Take 1 tablet (80 mg total) by mouth daily. 90 tablet 3  . glipiZIDE (GLUCOTROL) 10 MG tablet One tablet daily 100 tablet 3  . Insulin Glargine (LANTUS SOLOSTAR) 100 UNIT/ML Solostar Pen INJECT 39 UNITS INTO  THE SKIN AT BEDTIME 15 mL 6  . metoprolol (TOPROL-XL) 200 MG 24 hr tablet TAKE ONE-HALF TABLET BY MOUTH EVERY DAY 100 tablet 3  . Multiple Vitamins-Minerals (MULTIVITAMIN WITH MINERALS) tablet Take 1 tablet by mouth daily.     . trandolapril (MAVIK) 4 MG tablet Take 4 mg by mouth 2 (two) times daily.     Marland Kitchen ACCU-CHEK AVIVA PLUS test strip USE ONE STRIP ONCE DAILY (Patient not taking: Reported on 10/20/2014) 100 each 5  . atorvastatin (LIPITOR) 20 MG tablet TAKE 1 BY MOUTH DAILY. NEEDS TO SCHEDULE APPT (Patient not taking: Reported on 10/20/2014) 30 tablet 0  . BD PEN NEEDLE NANO U/F 32G X 4 MM MISC USE ONCE DAILY (Patient not taking: Reported on 10/20/2014) 100 each 3  . Lancets (ACCU-CHEK MULTICLIX) lancets 1 each by Other route daily. Dx 250.01 (Patient not taking: Reported on 10/20/2014) 102 each 3  . nitroGLYCERIN (NITRODUR - DOSED IN MG/24 HR) 0.4 mg/hr patch Place 1 patch (0.4 mg total) onto the skin as needed. (Patient not taking: Reported on 10/20/2014) 30 patch 1   No current facility-administered medications on file prior to visit.     REVIEW OF SYSTEMS: Cardiovascular: No chest pain, chest pressure, palpitations, orthopnea, or dyspnea on exertion. Positive for leg pain No history of DVT or phlebitis. Pulmonary: No productive cough, asthma or wheezing. Neurologic: No weakness, paresthesias, aphasia, or amaurosis. No dizziness. Hematologic: No bleeding problems or clotting disorders. Musculoskeletal: No joint pain or joint swelling. Gastrointestinal: No blood in stool or hematemesis Genitourinary: No dysuria or hematuria. Psychiatric:: No history of major depression. Integumentary: No rashes or ulcers. Constitutional: No fever or chills.  PHYSICAL EXAMINATION:  Filed Vitals:   10/20/14 1543 10/20/14 1545  BP: 143/71 126/73  Pulse: 69   Height: 5\' 6"  (1.676 m)   Weight: 174 lb 4.8 oz (79.062 kg)   SpO2: 99%     Body mass index is 28.15 kg/(m^2). General: The patient appears their stated age.  HEENT: No gross abnormalities Pulmonary: Respirations are non-labored Abdomen: Soft and non-tender. No pulsatile mass  Musculoskeletal: There are no major deformities.  Neurologic: No focal weakness or paresthesias are detected, Skin: There are no ulcer or rashes noted. Psychiatric: The patient has normal affect. Cardiovascular: There is a regular rate and rhythm without significant murmur appreciated.positive right carotid bruit  Diagnostic Studies: Carotid Dopplers show 80-99 percent right carotid stenosis and 60-79 percent left carotid stenosis with possible string sign   Assessment:  Asymptomatic bilateral carotid stenosis, right greater than left Plan: I discussed her treatment options. Because of his renal insufficiency, I do not think he is a good candidate for carotid stenting. I did discuss at length the possibility of right carotid endarterectomy. We discussed the risks and benefits including the risk of stroke and nerve injury. I also spent an extensive amount of  time discussing the possibility of participation in theCREST 2 trial. Berneda Rose is going to contact him. For further discussions. If he is not a candidate for the trial I will have him scheduled for right carotid endarterectomy.     Eldridge Abrahams, M.D. [facility] [place] Office: [phone] Pager: [phone]       No interval change   WElls Wenonah Milo

## 2014-11-29 LAB — CBC
HCT: 33 % — ABNORMAL LOW (ref 39.0–52.0)
HEMOGLOBIN: 11.1 g/dL — AB (ref 13.0–17.0)
MCH: 30.2 pg (ref 26.0–34.0)
MCHC: 33.6 g/dL (ref 30.0–36.0)
MCV: 89.7 fL (ref 78.0–100.0)
Platelets: 167 10*3/uL (ref 150–400)
RBC: 3.68 MIL/uL — ABNORMAL LOW (ref 4.22–5.81)
RDW: 14.1 % (ref 11.5–15.5)
WBC: 10.2 10*3/uL (ref 4.0–10.5)

## 2014-11-29 LAB — BASIC METABOLIC PANEL
ANION GAP: 12 (ref 5–15)
BUN: 73 mg/dL — ABNORMAL HIGH (ref 6–20)
CHLORIDE: 106 mmol/L (ref 101–111)
CO2: 21 mmol/L — ABNORMAL LOW (ref 22–32)
CREATININE: 3.53 mg/dL — AB (ref 0.61–1.24)
Calcium: 8.9 mg/dL (ref 8.9–10.3)
GFR calc Af Amer: 18 mL/min — ABNORMAL LOW (ref >60)
GFR calc non Af Amer: 16 mL/min — ABNORMAL LOW (ref >60)
Glucose, Bld: 189 mg/dL — ABNORMAL HIGH (ref 65–99)
Potassium: 4.2 mmol/L (ref 3.5–5.1)
Sodium: 139 mmol/L (ref 135–145)

## 2014-11-29 LAB — GLUCOSE, CAPILLARY: Glucose-Capillary: 182 mg/dL — ABNORMAL HIGH (ref 65–99)

## 2014-11-29 NOTE — Anesthesia Postprocedure Evaluation (Signed)
Anesthesia Post Note  Patient: Douglas Meyer  Procedure(s) Performed: Procedure(s) (LRB): RIGHT CAROTID ENDARTERECTOMY (Right) PATCH ANGIOPLASTY RIGHT CAROTID (Right)  Anesthesia type: General  Patient location: PACU  Post pain: Pain level controlled  Post assessment: Post-op Vital signs reviewed  Last Vitals: BP 119/58 mmHg  Pulse 84  Temp(Src) 36.5 C (Oral)  Resp 22  Wt 172 lb (78.019 kg)  SpO2 95%  Post vital signs: Reviewed  Level of consciousness: sedated  Complications: No apparent anesthesia complications

## 2014-11-29 NOTE — Progress Notes (Signed)
UR COMPLETED  

## 2014-11-29 NOTE — Progress Notes (Signed)
Douglas Meyer to be D/C'd Home per MD order.  Discussed with the patient and all questions fully answered.  VSS, Skin clean, dry and intact without evidence of skin break down, no evidence of skin tears noted. IV catheter discontinued intact. Site without signs and symptoms of complications. Dressing and pressure applied.  An After Visit Summary was printed and given to the patient. Patient received prescription.  D/c education completed with patient/family including follow up instructions, medication list, d/c activities limitations if indicated, with other d/c instructions as indicated by MD - patient able to verbalize understanding, all questions fully answered.    Patient escorted via [place], and D/C home via private auto.  Merlene Laughter [facility] 11/29/2014 11:06 AM

## 2014-11-29 NOTE — Progress Notes (Signed)
   Daily Progress Note  Assessment/Planning: POD #1 s/p R CEA    D/C drain  Ok to D/C if voids and tolerates breakfast  Subjective  - 1 Day Post-Op  No events overnight, no complaints  Objective Filed Vitals:   11/28/14 2331 11/29/14 0338 11/29/14 0500 11/29/14 0700  BP: 99/61 95/55 117/68   Pulse: 95 84 81   Temp: 98 F (36.7 C) 98.1 F (36.7 C)  97.7 F (36.5 C)  TempSrc: Oral Oral  Oral  Resp: 23 20 21    Weight:      SpO2: 96% 97% 96%     Intake/Output Summary (Last 24 hours) at 11/29/14 0832 Last data filed at 11/29/14 0700  Gross per 24 hour  Intake   2095 ml  Output   1305 ml  Net    790 ml   NECK  Soft neck, no hematoma, JP with minimal output (30 cc overnight) PULM  CTAB CV  RRR GI  soft, NTND NEURO midline tongue, sym M/S 5/5  Laboratory CBC    Component Value Date/Time   WBC 10.2 11/29/2014 0455   HGB 11.1* 11/29/2014 0455   HCT 33.0* 11/29/2014 0455   PLT 167 11/29/2014 0455    BMET    Component Value Date/Time   NA 139 11/29/2014 0455   K 4.2 11/29/2014 0455   CL 106 11/29/2014 0455   CO2 21* 11/29/2014 0455   GLUCOSE 189* 11/29/2014 0455   BUN 73* 11/29/2014 0455   CREATININE 3.53* 11/29/2014 0455   CALCIUM 8.9 11/29/2014 0455   GFRNONAA 16* 11/29/2014 0455   GFRAA 18* 11/29/2014 Dawson, MD [facility] [place] Office: [phone] Pager: [phone]  11/29/2014, 8:32 AM

## 2014-12-01 ENCOUNTER — Telehealth: Payer: Self-pay | Admitting: *Deleted

## 2014-12-01 ENCOUNTER — Encounter (HOSPITAL_COMMUNITY): Payer: Self-pay | Admitting: Surgery

## 2014-12-01 NOTE — Telephone Encounter (Signed)
Transition Care Management Follow-up Telephone Call  How have you been since you were released from the hospital? Fine doing much better than Friday   Do you understand why you were in the hospital? yes   Do you understand the discharge instrcutions? yes  Items Reviewed:  Medications reviewed: yes, patient is not taking pain medication at this time  Allergies reviewed: yes  Dietary changes reviewed: yes  Referrals reviewed: yes   Functional Questionnaire:   Activities of Daily Living (ADLs):   He states they are independent in the following: ambulation, bathing and hygiene, feeding, continence, grooming, toileting and dressing States they require assistance with the following: none   Any transportation issues/concerns?: wife will drive for 2 weeks   Any patient concerns? no   Confirmed importance and date/time of follow-up visits scheduled: yes   Confirmed with patient if condition begins to worsen call PCP or go to the ER.  Patient was given the Call-a-Nurse line [phone]: yes

## 2014-12-01 NOTE — Discharge Summary (Signed)
[facility] Discharge Summary  Douglas Meyer Jun 15, 1940 74 y.o. male  [id number]  Admission Date: 11/28/2014  Discharge Date: 11/29/2014  Physician: [name], MD  Admission Diagnosis: Right carotid artery stenosis I65.21  HPI:   This is a 74 y.o. male who presented for evaluation of carotid stenosis. This was initially detected on physical examination when a bruit was heard. The patient was sent for carotid Doppler studies which revealed bilateral stenosis. The patient is asymptomatic. Specifically he denies numbness or weakness in either extremity. He denies slurred speech. He denies amaurosis fugax.  The patient suffers from chronic renal insufficiency. His creatinine is around 2. He has a history of myocardial infarction. He is followed by Dr Percival Spanish. He had a low-risk Myoview on 10/23/2012. He suffers from hypercholesterolemia which is managed with a statin. He has a history of smoking.  Hospital Course:  The patient was admitted to the hospital and taken to the operating room on 11/28/2014 and underwent right carotid endarterectomy.  The patient tolerated the procedure well and was transported to the PACU in stable condition.  By POD 1, the patient's neuro status was intact. His JP drain had minimal output and was discontinued. His neck incision was soft without hematoma. He was able to void, ambulate and tolerate his diet without difficulty and was discharged home on POD 1.     Recent Labs  11/29/14 0455  NA 139  K 4.2  CL 106  CO2 21*  GLUCOSE 189*  BUN 73*  CALCIUM 8.9    Recent Labs  11/28/14 2130 11/29/14 0455  WBC 11.3* 10.2  HGB 12.2* 11.1*  HCT 36.2* 33.0*  PLT 165 167   No results for input(s): INR in the last 72 hours.  Discharge Instructions:   The patient is discharged to home with extensive instructions on wound care and progressive ambulation.  They are instructed not to drive or perform any heavy lifting  until returning to see the physician in his office.  Discharge Instructions    Call MD for:  redness, tenderness, or signs of infection (pain, swelling, bleeding, redness, odor or green/yellow discharge around incision site)    Complete by:  As directed      Call MD for:  severe or increased pain, loss or decreased feeling  in affected limb(s)    Complete by:  As directed      Call MD for:  temperature >100.5    Complete by:  As directed      Discharge wound care:    Complete by:  As directed   Wash wound daily with soap and water and pat dry.     Driving Restrictions    Complete by:  As directed   No driving for 2 weeks     Increase activity slowly    Complete by:  As directed   Walk with assistance use walker or cane as needed     Lifting restrictions    Complete by:  As directed   No lifting for 1 week     Resume previous diet    Complete by:  As directed            Discharge Diagnosis:  Right carotid artery stenosis I65.21  Secondary Diagnosis: Patient Active Problem List   Diagnosis Date Noted  . Asymptomatic stenosis of right carotid artery 11/28/2014  . Carotid bruit 09/22/2014  . Onychomycosis 10/09/2012  . GOUT, UNSPECIFIED 10/02/2008  . ERECTILE DYSFUNCTION 08/16/2007  . MYOCARDIAL INFARCTION 08/16/2007  .  NEPHROLITHIASIS, HX OF 08/16/2007  . POLYP, COLON 03/29/2007  . DIABETES MELLITUS, TYPE I 02/01/2007  . HYPERLIPIDEMIA 02/01/2007  . Essential hypertension 02/01/2007  . Coronary atherosclerosis 02/01/2007  . PERIPHERAL VASCULAR DISEASE 02/01/2007  . RENAL INSUFFICIENCY 02/01/2007   Past Medical History  Diagnosis Date  . CAD (coronary artery disease) 05/15/10    out of hospital myocardial infarction in the 1990's. this was picked up on an  EKG in 1997.  Cardiac catherterization demonstrated an occluded vessel and collateras.  I have no description of this.  A stress  perfusion study  done 2008 demonstrated an ejection fraction  of  53% with  inferolateral  hypokinesis.  There was an inferolateral defect with  scar and some mild mixed ischemia.   managed rx  . Hyperlipidemia   . Hypertension   . PVD (peripheral vascular disease)   . ED (erectile dysfunction)   . CKD (chronic kidney disease), stage IV   . Nephrolithiasis   . Gout   . Polyp of colon   . Hx of cardiovascular stress test     a. Lexiscan myoview 10/18/12: EF 53%, inf and inf-lat scar, slight peri-infarct ischemia, small area of apical ischemia; no change from 2006  . Bilateral carotid artery disease   . Myocardial infarction   . Cancer     skin  . Diabetes mellitus     DM, type II  . History of kidney stones       Medication List    TAKE these medications        ACCU-CHEK AVIVA PLUS test strip  Generic drug:  glucose blood  USE ONE STRIP ONCE DAILY     accu-chek multiclix lancets  1 each by Other route daily. Dx 250.01     allopurinol 300 MG tablet  Commonly known as:  ZYLOPRIM  Take 1 tablet (300 mg total) by mouth daily.     amLODipine 5 MG tablet  Commonly known as:  NORVASC  Take 1 tablet by mouth daily.     aspirin 325 MG tablet  Take 325 mg by mouth daily.     atorvastatin 20 MG tablet  Commonly known as:  LIPITOR  Take 1 tablet (20 mg total) by mouth daily at 6 PM.     BD PEN NEEDLE NANO U/F 32G X 4 MM Misc  Generic drug:  Insulin Pen Needle  USE ONCE DAILY     calcitRIOL 0.5 MCG capsule  Commonly known as:  ROCALTROL  Take 0.5 mcg by mouth every other day.     furosemide 80 MG tablet  Commonly known as:  LASIX  Take 1 tablet (80 mg total) by mouth daily.     glipiZIDE 10 MG tablet  Commonly known as:  GLUCOTROL  One tablet daily     Insulin Glargine 100 UNIT/ML Solostar Pen  Commonly known as:  LANTUS SOLOSTAR  INJECT 39 UNITS INTO THE SKIN AT BEDTIME     metoprolol 200 MG 24 hr tablet  Commonly known as:  TOPROL-XL  TAKE ONE-HALF TABLET BY MOUTH EVERY DAY     multivitamin with minerals tablet  Take 1 tablet by  mouth daily.     nitroGLYCERIN 0.4 MG SL tablet  Commonly known as:  NITROSTAT  Place 0.4 mg under the tongue every 5 (five) minutes as needed for chest pain.     oxyCODONE 5 MG immediate release tablet  Commonly known as:  ROXICODONE  Take 1 tablet (5 mg total) by mouth  every 6 (six) hours as needed for severe pain.     trandolapril 4 MG tablet  Commonly known as:  MAVIK  Take 4 mg by mouth 2 (two) times daily.     vitamin C 500 MG tablet  Commonly known as:  ASCORBIC ACID  Take 500 mg by mouth daily.        Oxycodone #20 No Refill  Disposition: Home  Patient's condition: is Good  Follow up: 1. Dr.  Trula Slade in 2 weeks.   Virgina Jock, PA-C [facility] [phone]  --- For [facility] use --- Instructions: Press F2 to tab through selections.  Delete question if not applicable.   Modified Rankin score at D/C (0-6): 0  IV medication needed for:  1. Hypertension: No 2. Hypotension: No  Post-op Complications: No  1. Post-op CVA or TIA: No  2. CN injury: No  3. Myocardial infarction: No  4.  CHF: No  5.  Dysrhythmia (new): No  6. Wound infection: No  7. Reperfusion symptoms: No  8. Return to OR: No   Discharge medications: Statin use:  Yes If No: [ ]  For Medical reasons, [ ]  Non-compliant, [ ]  Not-indicated ASA use:  Yes  If No: [ ]  For Medical reasons, [ ]  Non-compliant, [ ]  Not-indicated Beta blocker use:  Yes If No: [ ]  For Medical reasons, [ ]  Non-compliant, [ ]  Not-indicated ACE-Inhibitor use:  Yes If No: [ ]  For Medical reasons, [ ]  Non-compliant, [ ]  Not-indicated P2Y12 Antagonist use: No, [ ]  Plavix, [ ]  Plasugrel, [ ]  Ticlopinine, [ ]  Ticagrelor, [ ]  Other, [ ]  No for medical reason, [ ]  Non-compliant, [x ] Not-indicated Anti-coagulant use:  No, [ ]  Warfarin, [ ]  Rivaroxaban, [ ]  Dabigatran, [ ]  Other, [ ]  No for medical reason, [ ]  Non-compliant, [ ]  Not-indicated

## 2014-12-02 NOTE — Telephone Encounter (Signed)
Close encounter 

## 2014-12-03 ENCOUNTER — Ambulatory Visit (INDEPENDENT_AMBULATORY_CARE_PROVIDER_SITE_OTHER): Payer: Medicare Other | Admitting: Adult Health

## 2014-12-03 ENCOUNTER — Encounter: Payer: Self-pay | Admitting: Adult Health

## 2014-12-03 VITALS — BP 110/80 | Temp 98.2°F | Ht 66.0 in | Wt 168.6 lb

## 2014-12-03 DIAGNOSIS — I6523 Occlusion and stenosis of bilateral carotid arteries: Secondary | ICD-10-CM

## 2014-12-03 DIAGNOSIS — Z09 Encounter for follow-up examination after completed treatment for conditions other than malignant neoplasm: Secondary | ICD-10-CM

## 2014-12-03 DIAGNOSIS — Z9889 Other specified postprocedural states: Secondary | ICD-10-CM | POA: Diagnosis not present

## 2014-12-03 NOTE — Progress Notes (Addendum)
Subjective:    Patient ID: Douglas Meyer, male    DOB: [birth year], 74 y.o.   MRN: [id number]  HPI  [name] presents to the office for hospital follow up. He was discharged on 11/29/2014 after being admitted  for evaluation of carotid stenosis when a bruit was heard on physical exam.   The patient suffers from chronic renal insufficiency. His creatinine is around 2. He has a history of myocardial infarction. He is followed by Dr Percival Spanish. He had a low-risk Myoview on 10/23/2012. He suffers from hypercholesterolemia which is managed with a statin. He has a history of smoking.  The patient was admitted to the hospital and taken to the operating room on 11/28/2014 and underwent right carotid endarterectomy. The patient tolerated the procedure well and was transported to the PACU in stable condition.  He is currently on a statin, ASA, BB, and ACEi.   He endorses feeling well since being discharged from the hospital   Review of Systems  HENT: Negative.   Respiratory: Negative.   Cardiovascular: Negative.   Gastrointestinal: Negative.   Skin: Positive for color change and wound.  Neurological: Negative.   Hematological: Negative.   Psychiatric/Behavioral: Negative.   All other systems reviewed and are negative.    Past Medical History  Diagnosis Date  . CAD (coronary artery disease) 05/15/10    out of hospital myocardial infarction in the 1990's. this was picked up on an  EKG in 1997.  Cardiac catherterization demonstrated an occluded vessel and collateras.  I have no description of this.  A stress  perfusion study  done 2008 demonstrated an ejection fraction  of  53% with inferolateral  hypokinesis.  There was an inferolateral defect with  scar and some mild mixed ischemia.   managed rx  . Hyperlipidemia   . Hypertension   . PVD (peripheral vascular disease)   . ED (erectile dysfunction)   . CKD (chronic kidney disease), stage IV   . Nephrolithiasis   . Gout   . Polyp of  colon   . Hx of cardiovascular stress test     a. Lexiscan myoview 10/18/12: EF 53%, inf and inf-lat scar, slight peri-infarct ischemia, small area of apical ischemia; no change from 2006  . Bilateral carotid artery disease   . Myocardial infarction   . Cancer     skin  . Diabetes mellitus     DM, type II  . History of kidney stones     History   Social History  . Marital Status: Married    Spouse Name: N/A  . Number of Children: 4  . Years of Education: N/A   Occupational History  .  [employer]    retired  .     Social History Main Topics  . Smoking status: Former Smoker -- 6 years    Quit date: 06/06/1973  . Smokeless tobacco: Not on file  . Alcohol Use: 3.6 oz/week    1 Glasses of wine, 5 Cans of beer per week  . Drug Use: No  . Sexual Activity: Not on file   Other Topics Concern  . Not on file   Social History Narrative   Lives with wife.      Past Surgical History  Procedure Laterality Date  . Humerus fracture surgery      left  . Mohs surgery      multiple  . Cystoscopy      stone removal x 1  . Colonoscopy w/ polypectomy    .  Eye surgery  06/2013    bilateral cataract surgery  . Endarterectomy Right 11/28/2014    Procedure: RIGHT CAROTID ENDARTERECTOMY;  Surgeon: Serafina Mitchell, MD;  Location: [facility] OR;  Service: Vascular;  Laterality: Right;  . Patch angioplasty Right 11/28/2014    Procedure: PATCH ANGIOPLASTY RIGHT CAROTID;  Surgeon: Serafina Mitchell, MD;  Location: [facility] OR;  Service: Vascular;  Laterality: Right;    Family History  Problem Relation Age of Onset  . Heart attack Father     deceased 39  . Diabetes type II Brother   . Diabetes type II Brother   . Diabetes Brother     rheumatic fever, peptic ulcer disease, deceased  . Diabetes Sister     deceased 42  . Heart attack Mother     deceased age 71s    No Known Allergies  Current Outpatient Prescriptions on File Prior to Visit  Medication Sig Dispense Refill  . ACCU-CHEK AVIVA PLUS test  strip USE ONE STRIP ONCE DAILY 100 each 5  . allopurinol (ZYLOPRIM) 300 MG tablet Take 1 tablet (300 mg total) by mouth daily. 100 tablet 3  . amLODipine (NORVASC) 5 MG tablet Take 1 tablet by mouth daily.  0  . Ascorbic Acid (VITAMIN C) 500 MG tablet Take 500 mg by mouth daily.      Marland Kitchen aspirin 325 MG tablet Take 325 mg by mouth daily.      Marland Kitchen atorvastatin (LIPITOR) 20 MG tablet Take 1 tablet (20 mg total) by mouth daily at 6 PM. 30 tablet 6  . BD PEN NEEDLE NANO U/F 32G X 4 MM MISC USE ONCE DAILY 100 each 3  . calcitRIOL (ROCALTROL) 0.5 MCG capsule Take 0.5 mcg by mouth every other day.     . furosemide (LASIX) 80 MG tablet Take 1 tablet (80 mg total) by mouth daily. 90 tablet 3  . glipiZIDE (GLUCOTROL) 10 MG tablet One tablet daily (Patient taking differently: at bedtime. One tablet daily) 100 tablet 3  . Insulin Glargine (LANTUS SOLOSTAR) 100 UNIT/ML Solostar Pen INJECT 39 UNITS INTO THE SKIN AT BEDTIME (Patient taking differently: Inject 40 Units into the skin at bedtime. ) 15 mL 6  . Lancets (ACCU-CHEK MULTICLIX) lancets 1 each by Other route daily. Dx 250.01 102 each 3  . metoprolol (TOPROL-XL) 200 MG 24 hr tablet TAKE ONE-HALF TABLET BY MOUTH EVERY DAY 100 tablet 3  . Multiple Vitamins-Minerals (MULTIVITAMIN WITH MINERALS) tablet Take 1 tablet by mouth daily.      . nitroGLYCERIN (NITROSTAT) 0.4 MG SL tablet Place 0.4 mg under the tongue every 5 (five) minutes as needed for chest pain.    . trandolapril (MAVIK) 4 MG tablet Take 4 mg by mouth 2 (two) times daily.     Marland Kitchen oxyCODONE (ROXICODONE) 5 MG immediate release tablet Take 1 tablet (5 mg total) by mouth every 6 (six) hours as needed for severe pain. (Patient not taking: Reported on 12/01/2014) 20 tablet 0   No current facility-administered medications on file prior to visit.    BP 110/80 mmHg  Temp(Src) 98.2 F (36.8 C) (Oral)  Ht 5\' 6"  (1.676 m)  Wt 168 lb 9.6 oz (76.476 kg)  BMI 27.23 kg/m2       Objective:   Physical Exam    Constitutional: He is oriented to person, place, and time. He appears well-developed and well-nourished. No distress.  Eyes: Conjunctivae are normal. Pupils are equal, round, and reactive to light. Right eye exhibits no discharge. Left  eye exhibits no discharge.  Neck: Normal range of motion. Neck supple.  Surgical wound from right endarterectomy   Cardiovascular: Normal rate, regular rhythm, normal heart sounds and intact distal pulses.  Exam reveals no gallop and no friction rub.   No murmur heard. Pulmonary/Chest: Effort normal and breath sounds normal. No respiratory distress. He has no wheezes. He has no rales. He exhibits no tenderness.  Abdominal: Soft. Bowel sounds are normal. He exhibits no distension and no mass. There is no tenderness. There is no rebound and no guarding.  Musculoskeletal: He exhibits edema and tenderness.  Neurological: He is alert and oriented to person, place, and time.  Skin: Skin is warm and dry. He is not diaphoretic.  Well healing surgical wound on right neck. Bruising noted in multiple stages of healing. Trace edema but no signs or symptoms of infection.   Psychiatric: He has a normal mood and affect. His behavior is normal. Judgment and thought content normal.  Nursing note and vitals reviewed.      Assessment & Plan:  1. Hospital discharge follow-up - Continue with current plan of care which includes follow up with Dr. Trula Slade in two weeks, progressive ambulation, wound care and no driving or moving heavy objects until seen by Cardiology.  - If any signs or symptoms of infection then follow up - Make an appointment for CPE

## 2014-12-03 NOTE — Progress Notes (Signed)
Pre visit review using our clinic review tool, if applicable. No additional management support is needed unless otherwise documented below in the visit note. 

## 2014-12-03 NOTE — Patient Instructions (Signed)
It was great meeting you today!  Please continue to take your medications as prescribed and follow up with your other physicians.   Be mindful of any signs and symptoms of infection to the surgical site. Follow up if you notice any.   Schedule a complete physical with me as soon as you can, and make sure you do not eat anything after midnight.   Let me know if there is anything I can do for you.

## 2014-12-17 ENCOUNTER — Ambulatory Visit (INDEPENDENT_AMBULATORY_CARE_PROVIDER_SITE_OTHER): Payer: Medicare Other | Admitting: Adult Health

## 2014-12-17 ENCOUNTER — Encounter: Payer: Self-pay | Admitting: Surgery

## 2014-12-17 ENCOUNTER — Encounter: Payer: Self-pay | Admitting: Adult Health

## 2014-12-17 VITALS — BP 102/70 | Temp 98.6°F | Ht 66.0 in | Wt 169.1 lb

## 2014-12-17 DIAGNOSIS — E785 Hyperlipidemia, unspecified: Secondary | ICD-10-CM

## 2014-12-17 DIAGNOSIS — Z23 Encounter for immunization: Secondary | ICD-10-CM

## 2014-12-17 DIAGNOSIS — Z125 Encounter for screening for malignant neoplasm of prostate: Secondary | ICD-10-CM

## 2014-12-17 DIAGNOSIS — I10 Essential (primary) hypertension: Secondary | ICD-10-CM

## 2014-12-17 DIAGNOSIS — H9193 Unspecified hearing loss, bilateral: Secondary | ICD-10-CM | POA: Diagnosis not present

## 2014-12-17 DIAGNOSIS — E109 Type 1 diabetes mellitus without complications: Secondary | ICD-10-CM | POA: Diagnosis not present

## 2014-12-17 DIAGNOSIS — Z0000 Encounter for general adult medical examination without abnormal findings: Secondary | ICD-10-CM

## 2014-12-17 MED ORDER — AMLODIPINE BESYLATE 5 MG PO TABS: 5.0000 mg | ORAL_TABLET | Freq: Every day | ORAL | Status: DC

## 2014-12-17 MED ORDER — ATORVASTATIN CALCIUM 20 MG PO TABS: 20.0000 mg | ORAL_TABLET | Freq: Every day | ORAL | Status: DC

## 2014-12-17 NOTE — Patient Instructions (Addendum)
It was great seeing you today!   I will follow up with you regarding your blood work.   Start exercising and eating a heart healthy and/or diabetic diet.   Please let me know if you need anything.

## 2014-12-17 NOTE — Progress Notes (Signed)
Subjective:  Patient presents today for their annual wellness visit. History of gout, hyperlipidemia, hypertension, renal insufficiency, coronary disease, diabetes type 1  His blood sugars have been much more well controlled than in the past. Currently he reports that they have been between 90-110.     He gets routine eye care, dental care, colonoscopy and GI, vaccinations   Cognitive function normal he walks on a regular basis home health safety reviewed no issues identified, no guns in the house, he does have a health care power of attorney and living well  His most recent hospitalization was in June 2016 for stent placement in right carotid artery. His wound is well-healed  He has no issues or problems that he would like to discuss today  He has an upcoming appointment with cardiology   Preventive Screening-Counseling & Management  Smoking Status: Former Smoker [employer] Smoking status: No smokers in home  Risk Factors Regular exercise: Does not exercise Diet: Does not follow any type of diet Fall Risk: None   Cardiac risk factors:  advanced age (older than 60 for men, 24 for women) Yes Hyperlipidemia Yes No diabetes. Yes Family History: Yes  Depression Screen None. PHQ2 0   Activities of Daily Living Independent ADLs and IADLs   Hearing Difficulties: Has trouble with hearing  Cognitive Testing No reported trouble.   Normal 3 word recall  List the Names of Other Physician/Practitioners you currently use: 1.Dr. Jimmy Footman - Nephrology 2. [place] 3. Dr. Percival Spanish - Cardiology  Immunization History  Administered Date(s) Administered  . Influenza Whole 06/06/2004, 03/07/2007, 03/06/2009, 04/12/2010  . Influenza-Unspecified 02/04/2013, 01/18/2014  . Pneumococcal Polysaccharide-23 06/06/2000, 02/01/2007  . Td 06/06/2000, 06/28/2010  . Zoster 07/21/2008   Required Immunizations needed today Prevnar 13  Screening tests- up to  date Health Maintenance Due  Topic Date Due  . PNA vac Low Risk Adult (2 of 2 - PCV13) 02/01/2008  . URINE MICROALBUMIN  12/11/2014    ROS- No pertinent positives discovered in course of AWV  The following were reviewed and entered/updated in epic: Past Medical History  Diagnosis Date  . CAD (coronary artery disease) 05/15/10    out of hospital myocardial infarction in the 1990's. this was picked up on an  EKG in 1997.  Cardiac catherterization demonstrated an occluded vessel and collateras.  I have no description of this.  A stress  perfusion study  done 2008 demonstrated an ejection fraction  of  53% with inferolateral  hypokinesis.  There was an inferolateral defect with  scar and some mild mixed ischemia.   managed rx  . Hyperlipidemia   . Hypertension   . PVD (peripheral vascular disease)   . ED (erectile dysfunction)   . CKD (chronic kidney disease), stage IV   . Nephrolithiasis   . Gout   . Polyp of colon   . Hx of cardiovascular stress test     a. Lexiscan myoview 10/18/12: EF 53%, inf and inf-lat scar, slight peri-infarct ischemia, small area of apical ischemia; no change from 2006  . Bilateral carotid artery disease   . Myocardial infarction   . Cancer     skin  . Diabetes mellitus     DM, type II  . History of kidney stones    Patient Active Problem List   Diagnosis Date Noted  . Asymptomatic stenosis of right carotid artery 11/28/2014  . Carotid bruit 09/22/2014  . Onychomycosis 10/09/2012  . GOUT, UNSPECIFIED 10/02/2008  .  ERECTILE DYSFUNCTION 08/16/2007  . MYOCARDIAL INFARCTION 08/16/2007  . NEPHROLITHIASIS, HX OF 08/16/2007  . POLYP, COLON 03/29/2007  . DIABETES MELLITUS, TYPE I 02/01/2007  . HYPERLIPIDEMIA 02/01/2007  . Essential hypertension 02/01/2007  . Coronary atherosclerosis 02/01/2007  . PERIPHERAL VASCULAR DISEASE 02/01/2007  . RENAL INSUFFICIENCY 02/01/2007   Past Surgical History  Procedure Laterality Date  . Humerus fracture surgery       left  . Mohs surgery      multiple  . Cystoscopy      stone removal x 1  . Colonoscopy w/ polypectomy    . Eye surgery  06/2013    bilateral cataract surgery  . Endarterectomy Right 11/28/2014    Procedure: RIGHT CAROTID ENDARTERECTOMY;  Surgeon: Serafina Mitchell, MD;  Location: [facility] OR;  Service: Vascular;  Laterality: Right;  . Patch angioplasty Right 11/28/2014    Procedure: PATCH ANGIOPLASTY RIGHT CAROTID;  Surgeon: Serafina Mitchell, MD;  Location: [facility] OR;  Service: Vascular;  Laterality: Right;    Family History  Problem Relation Age of Onset  . Heart attack Father     deceased 51  . Diabetes type II Brother   . Diabetes type II Brother   . Diabetes Brother     rheumatic fever, peptic ulcer disease, deceased  . Diabetes Sister     deceased 39  . Heart attack Mother     deceased age 75s    Medications- reviewed and updated Current Outpatient Prescriptions  Medication Sig Dispense Refill  . ACCU-CHEK AVIVA PLUS test strip USE ONE STRIP ONCE DAILY 100 each 5  . allopurinol (ZYLOPRIM) 300 MG tablet Take 1 tablet (300 mg total) by mouth daily. 100 tablet 3  . amLODipine (NORVASC) 5 MG tablet Take 1 tablet (5 mg total) by mouth daily. 90 tablet 2  . aspirin 325 MG tablet Take 325 mg by mouth daily.      Marland Kitchen atorvastatin (LIPITOR) 20 MG tablet Take 1 tablet (20 mg total) by mouth daily at 6 PM. 90 tablet 2  . BD PEN NEEDLE NANO U/F 32G X 4 MM MISC USE ONCE DAILY 100 each 3  . calcitRIOL (ROCALTROL) 0.5 MCG capsule Take 0.5 mcg by mouth every other day.     . furosemide (LASIX) 80 MG tablet Take 1 tablet (80 mg total) by mouth daily. 90 tablet 3  . glipiZIDE (GLUCOTROL) 10 MG tablet One tablet daily (Patient taking differently: at bedtime. One tablet daily) 100 tablet 3  . Insulin Glargine (LANTUS SOLOSTAR) 100 UNIT/ML Solostar Pen INJECT 39 UNITS INTO THE SKIN AT BEDTIME (Patient taking differently: Inject 40 Units into the skin at bedtime. ) 15 mL 6  . Lancets (ACCU-CHEK MULTICLIX)  lancets 1 each by Other route daily. Dx 250.01 102 each 3  . metoprolol (TOPROL-XL) 200 MG 24 hr tablet TAKE ONE-HALF TABLET BY MOUTH EVERY DAY 100 tablet 3  . Multiple Vitamins-Minerals (MULTIVITAMIN WITH MINERALS) tablet Take 1 tablet by mouth daily.      . nitroGLYCERIN (NITROSTAT) 0.4 MG SL tablet Place 0.4 mg under the tongue every 5 (five) minutes as needed for chest pain.    . trandolapril (MAVIK) 4 MG tablet Take 4 mg by mouth 2 (two) times daily.     . Ascorbic Acid (VITAMIN C) 500 MG tablet Take 500 mg by mouth daily.       No current facility-administered medications for this visit.    Allergies-reviewed and updated No Known Allergies  History   Social History  .  Marital Status: Married    Spouse Name: N/A  . Number of Children: 4  . Years of Education: N/A   Occupational History  .  [employer]    retired  .     Social History Main Topics  . Smoking status: Former Smoker -- 6 years    Quit date: 06/06/1973  . Smokeless tobacco: Not on file  . Alcohol Use: 3.6 oz/week    1 Glasses of wine, 5 Cans of beer per week  . Drug Use: No  . Sexual Activity: Not on file   Other Topics Concern  . None   Social History Narrative   Lives with wife.      Objective: BP 102/70 mmHg  Temp(Src) 98.6 F (37 C) (Oral)  Ht 5\' 6"  (1.676 m)  Wt 169 lb 1.6 oz (76.703 kg)  BMI 27.31 kg/m2 Nursing note and vitals reviewed. Constitutional: He is oriented to person, place, and time. He appears well-developed and well-nourished.  HENT:  Head: Normocephalic and atraumatic.  Right Ear: External ear normal.  Left Ear: External ear normal.  Nose: Nose normal.  Mouth/Throat: Oropharynx is clear and moist.  Severe periodontal disease  Eyes: Conjunctivae and EOM are normal. Pupils are equal, round, and reactive to light.  Neck: Normal range of motion. Neck supple. No JVD present. No tracheal deviation present. No thyromegaly present.  Cardiovascular: Normal rate, regular rhythm,  normal heart sounds and intact distal pulses. Exam reveals no gallop and no friction rub.  No murmur heard. No carotid bruit and no femoral bruit Aorta normal Peripheral pulses 1+ and symmetrical Pulmonary/Chest: Effort normal and breath sounds normal. No stridor. No respiratory distress. He has no wheezes. He has no rales. He exhibits no tenderness.  Abdominal: Soft. Bowel sounds are normal. He exhibits no distension and no mass. There is no tenderness. There is no rebound and no guarding.  Genitourinary: Rectum normal, prostate normal and penis normal. Guaiac negative stool. No penile tenderness.  Musculoskeletal: Normal range of motion. He exhibits no edema and no tenderness.  Lymphadenopathy:   He has no cervical adenopathy.  Neurological: He is alert and oriented to person, place, and time. He has normal reflexes. No cranial nerve deficit. He exhibits normal muscle tone.  Skin: Skin is warm and dry. No rash noted. No erythema. No pallor.    Assessment/Plan: 1. Medicare annual wellness visit, subsequent - Start exercising and eat a healthy diet - Follow up in three months for follow up re: diabetes and htn - Follow up in one year for CPE -- Follow up sooner if needed.   2. Hyperlipidemia - Basic metabolic panel - CBC with Differential/Platelet - Hepatic function panel - Lipid panel - POCT urinalysis dipstick - PSA - TSH - EKG 12-Lead-Sinus  Rhythm  -Old inferior-apical infarct.   -Nonspecific ST depression  -Nondiagnostic. Rate 93  3. Need for pneumococcal vaccination - Pneumococcal conjugate vaccine 13-valent IM  4. Essential hypertension  - amLODipine (NORVASC) 5 MG tablet; Take 1 tablet (5 mg total) by mouth daily.  Dispense: 90 tablet; Refill: 2 - Basic metabolic panel - CBC with Differential/Platelet - Hepatic function panel - Lipid panel - POCT urinalysis dipstick - PSA - TSH - Continue to monitor blood pressure at home  5. Type 1 diabetes mellitus  without complication - Basic metabolic panel - CBC with Differential/Platelet - Hepatic function panel - Lipid panel - EKG 12-Lead - Continue to monitor blood sugar at home.      These are  the goals we discussed: Goals    . Exercise 150 minutes per week (moderate activity)    . Increase lean proteins       This is a list of the screening recommended for you and due dates:  Health Maintenance  Topic Date Due  . Pneumonia vaccines (2 of 2 - PCV13) 02/01/2008  . Urine Protein Check  12/11/2014  . Flu Shot  01/05/2015  . Complete foot exam   02/19/2015  . Hemoglobin A1C  05/22/2015  . Eye exam for diabetics  06/21/2015  . Colon Cancer Screening  03/28/2017  . Tetanus Vaccine  06/28/2020  . Shingles Vaccine  Completed      No problem-specific assessment & plan notes found for this encounter.  Return precautions advised.   No orders of the defined types were placed in this encounter.

## 2014-12-17 NOTE — Addendum Note (Signed)
Addended by: Apolinar Junes on: 12/17/2014 08:48 PM   Modules accepted: Orders

## 2014-12-19 ENCOUNTER — Encounter: Payer: Self-pay | Admitting: Surgery

## 2014-12-19 ENCOUNTER — Ambulatory Visit (INDEPENDENT_AMBULATORY_CARE_PROVIDER_SITE_OTHER): Payer: Self-pay | Admitting: Surgery

## 2014-12-19 VITALS — BP 153/74 | HR 62 | Resp 14 | Ht 66.0 in | Wt 170.0 lb

## 2014-12-19 DIAGNOSIS — Z9889 Other specified postprocedural states: Secondary | ICD-10-CM

## 2014-12-19 DIAGNOSIS — I6523 Occlusion and stenosis of bilateral carotid arteries: Secondary | ICD-10-CM

## 2014-12-19 NOTE — Progress Notes (Signed)
Patient name: Douglas Meyer MRN: [id number] DOB: 1940-11-29 Sex: male     Chief Complaint  Patient presents with  . Carotid    f/u  s/p  right carotid endar 11/28/14    HISTORY OF PRESENT ILLNESS: The patient is back for follow-up.  He is status post right carotid endarterectomy with bovine pericardial patch angioplasty for asymptomatic right carotid stenosis on 11/28/2014.  Intraoperative findings included 85% stenosis.  The patient reports no complaints today.  He denies having any neurologic events.  His pain has been minimal.  Past Medical History  Diagnosis Date  . CAD (coronary artery disease) 05/15/10    out of hospital myocardial infarction in the 1990's. this was picked up on an  EKG in 1997.  Cardiac catherterization demonstrated an occluded vessel and collateras.  I have no description of this.  A stress  perfusion study  done 2008 demonstrated an ejection fraction  of  53% with inferolateral  hypokinesis.  There was an inferolateral defect with  scar and some mild mixed ischemia.   managed rx  . Hyperlipidemia   . Hypertension   . PVD (peripheral vascular disease)   . ED (erectile dysfunction)   . CKD (chronic kidney disease), stage IV   . Nephrolithiasis   . Gout   . Polyp of colon   . Hx of cardiovascular stress test     a. Lexiscan myoview 10/18/12: EF 53%, inf and inf-lat scar, slight peri-infarct ischemia, small area of apical ischemia; no change from 2006  . Bilateral carotid artery disease   . Myocardial infarction   . Cancer     skin  . Diabetes mellitus     DM, type II  . History of kidney stones     Past Surgical History  Procedure Laterality Date  . Humerus fracture surgery      left  . Mohs surgery      multiple  . Cystoscopy      stone removal x 1  . Colonoscopy w/ polypectomy    . Eye surgery  06/2013    bilateral cataract surgery  . Endarterectomy Right 11/28/2014    Procedure: RIGHT CAROTID ENDARTERECTOMY;  Surgeon: Serafina Mitchell, MD;   Location: [facility];  Service: Vascular;  Laterality: Right;  . Patch angioplasty Right 11/28/2014    Procedure: PATCH ANGIOPLASTY RIGHT CAROTID;  Surgeon: Serafina Mitchell, MD;  Location: [place];  Service: Vascular;  Laterality: Right;    History   Social History  . Marital Status: Married    Spouse Name: N/A  . Number of Children: 4  . Years of Education: N/A   Occupational History  .  [employer]    retired  .     Social History Main Topics  . Smoking status: Former Smoker -- 6 years    Quit date: 06/06/1973  . Smokeless tobacco: Never Used  . Alcohol Use: 3.6 oz/week    1 Glasses of wine, 5 Cans of beer per week  . Drug Use: No  . Sexual Activity: Not on file   Other Topics Concern  . Not on file   Social History Narrative   Lives with wife.      Family History  Problem Relation Age of Onset  . Heart attack Father     deceased 61  . Diabetes type II Brother   . Diabetes type II Brother   . Diabetes Brother     rheumatic fever, peptic ulcer disease, deceased  .  Diabetes Sister     deceased 61  . Heart attack Mother     deceased age 61s    Allergies as of 12/19/2014  . (No Known Allergies)    Current Outpatient Prescriptions on File Prior to Visit  Medication Sig Dispense Refill  . ACCU-CHEK AVIVA PLUS test strip USE ONE STRIP ONCE DAILY 100 each 5  . allopurinol (ZYLOPRIM) 300 MG tablet Take 1 tablet (300 mg total) by mouth daily. 100 tablet 3  . amLODipine (NORVASC) 5 MG tablet Take 1 tablet (5 mg total) by mouth daily. 90 tablet 2  . Ascorbic Acid (VITAMIN C) 500 MG tablet Take 500 mg by mouth daily.      Marland Kitchen aspirin 325 MG tablet Take 325 mg by mouth daily.      Marland Kitchen atorvastatin (LIPITOR) 20 MG tablet Take 1 tablet (20 mg total) by mouth daily at 6 PM. 90 tablet 2  . BD PEN NEEDLE NANO U/F 32G X 4 MM MISC USE ONCE DAILY 100 each 3  . calcitRIOL (ROCALTROL) 0.5 MCG capsule Take 0.5 mcg by mouth every other day.     . furosemide (LASIX) 80 MG tablet Take 1 tablet  (80 mg total) by mouth daily. 90 tablet 3  . glipiZIDE (GLUCOTROL) 10 MG tablet One tablet daily (Patient taking differently: at bedtime. One tablet daily) 100 tablet 3  . Insulin Glargine (LANTUS SOLOSTAR) 100 UNIT/ML Solostar Pen INJECT 39 UNITS INTO THE SKIN AT BEDTIME (Patient taking differently: Inject 40 Units into the skin at bedtime. ) 15 mL 6  . Lancets (ACCU-CHEK MULTICLIX) lancets 1 each by Other route daily. Dx 250.01 102 each 3  . metoprolol (TOPROL-XL) 200 MG 24 hr tablet TAKE ONE-HALF TABLET BY MOUTH EVERY DAY 100 tablet 3  . Multiple Vitamins-Minerals (MULTIVITAMIN WITH MINERALS) tablet Take 1 tablet by mouth daily.      . nitroGLYCERIN (NITROSTAT) 0.4 MG SL tablet Place 0.4 mg under the tongue every 5 (five) minutes as needed for chest pain.    . trandolapril (MAVIK) 4 MG tablet Take 4 mg by mouth 2 (two) times daily.      No current facility-administered medications on file prior to visit.     REVIEW OF SYSTEMS: All negative  PHYSICAL EXAMINATION:   Vital signs are  Filed Vitals:   12/19/14 0835 12/19/14 0839 12/19/14 0840  BP: 150/73 155/74 153/74  Pulse: 64 63 62  Resp: 14    Height: 5\' 6"  (1.676 m)    Weight: 170 lb (77.111 kg)     Body mass index is 27.45 kg/(m^2). General: The patient appears their stated age. HEENT:  Right carotid incision has been healing nicely.  There is a healing ridge.  No skin separation or infection identified. Pulmonary:  Non labored breathing Abdomen: Soft and non-tender Musculoskeletal: There are no major deformities. Neurologic: No focal weakness or paresthesias are detected, Skin: There are no ulcer or rashes noted. Psychiatric: The patient has normal affect. Cardiovascular: There is a regular rate and rhythm without significant murmur appreciated.   Diagnostic Studies None  Assessment: Status post right carotid endarterectomy Plan: The patient has done very well from his operation.  He remains asymptomatic.  On his  preoperative study he had 60-79% left carotid stenosis.  I will plan for follow-up with the patient in 9 months with a repeat carotid duplex.  Eldridge Abrahams, M.D. [facility] Office: [phone] Pager:  [phone]

## 2014-12-19 NOTE — Addendum Note (Signed)
Addended by: Dorthula Rue L on: 12/19/2014 10:25 AM   Modules accepted: Orders

## 2014-12-23 ENCOUNTER — Other Ambulatory Visit: Payer: Medicare Other

## 2014-12-23 LAB — LIPID PANEL
CHOL/HDL RATIO: 4
CHOLESTEROL: 141 mg/dL (ref 0–200)
HDL: 31.4 mg/dL — ABNORMAL LOW (ref >39.00)
NonHDL: 109.6
TRIGLYCERIDES: 358 mg/dL — AB (ref 0.0–149.0)
VLDL: 71.6 mg/dL — AB (ref 0.0–40.0)

## 2014-12-23 LAB — CBC WITH DIFFERENTIAL/PLATELET
BASOS ABS: 0 10*3/uL (ref 0.0–0.1)
Basophils Relative: 0.4 % (ref 0.0–3.0)
EOS PCT: 8.9 % — AB (ref 0.0–5.0)
Eosinophils Absolute: 0.6 10*3/uL (ref 0.0–0.7)
HEMATOCRIT: 36.1 % — AB (ref 39.0–52.0)
HEMOGLOBIN: 11.9 g/dL — AB (ref 13.0–17.0)
LYMPHS ABS: 1.7 10*3/uL (ref 0.7–4.0)
Lymphocytes Relative: 26.1 % (ref 12.0–46.0)
MCHC: 33.1 g/dL (ref 30.0–36.0)
MCV: 92.7 fl (ref 78.0–100.0)
Monocytes Absolute: 0.5 10*3/uL (ref 0.1–1.0)
Monocytes Relative: 7.2 % (ref 3.0–12.0)
NEUTROS PCT: 57.4 % (ref 43.0–77.0)
Neutro Abs: 3.7 10*3/uL (ref 1.4–7.7)
PLATELETS: 202 10*3/uL (ref 150.0–400.0)
RBC: 3.9 Mil/uL — AB (ref 4.22–5.81)
RDW: 15.1 % (ref 11.5–15.5)
WBC: 6.5 10*3/uL (ref 4.0–10.5)

## 2014-12-23 LAB — BASIC METABOLIC PANEL
BUN: 56 mg/dL — AB (ref 6–23)
CO2: 29 meq/L (ref 19–32)
Calcium: 9.5 mg/dL (ref 8.4–10.5)
Chloride: 104 mEq/L (ref 96–112)
Creatinine, Ser: 3.25 mg/dL — ABNORMAL HIGH (ref 0.40–1.50)
GFR: 19.88 mL/min — ABNORMAL LOW (ref >60.00)
Glucose, Bld: 70 mg/dL (ref 70–99)
POTASSIUM: 4 meq/L (ref 3.5–5.1)
SODIUM: 143 meq/L (ref 135–145)

## 2014-12-23 LAB — POCT URINALYSIS DIPSTICK
BILIRUBIN UA: NEGATIVE
Blood, UA: NEGATIVE
Glucose, UA: NEGATIVE
Ketones, UA: NEGATIVE
Leukocytes, UA: NEGATIVE
Nitrite, UA: NEGATIVE
PH UA: 5
PROTEIN UA: 1
Spec Grav, UA: 1.01
UROBILINOGEN UA: 0.2

## 2014-12-23 LAB — TSH: TSH: 1.95 u[IU]/mL (ref 0.35–4.50)

## 2014-12-23 LAB — PSA: PSA: 1.15 ng/mL (ref 0.10–4.00)

## 2014-12-23 LAB — HEPATIC FUNCTION PANEL
ALT: 13 U/L (ref 0–53)
AST: 12 U/L (ref 0–37)
Albumin: 3.8 g/dL (ref 3.5–5.2)
Alkaline Phosphatase: 99 U/L (ref 39–117)
Bilirubin, Direct: 0.1 mg/dL (ref 0.0–0.3)
TOTAL PROTEIN: 6.2 g/dL (ref 6.0–8.3)
Total Bilirubin: 0.6 mg/dL (ref 0.2–1.2)

## 2014-12-23 LAB — LDL CHOLESTEROL, DIRECT: Direct LDL: 46 mg/dL

## 2014-12-25 ENCOUNTER — Telehealth: Payer: Self-pay | Admitting: Adult Health

## 2014-12-25 NOTE — Telephone Encounter (Signed)
Spoke with patient regarding labs. Informed him of his elevated triglycerides and ways to reduce the levels

## 2014-12-25 NOTE — Telephone Encounter (Signed)
Left message with family member to call back regarding labs

## 2015-01-22 ENCOUNTER — Ambulatory Visit: Payer: Medicare Other | Admitting: Cardiology

## 2015-02-27 ENCOUNTER — Encounter: Payer: Self-pay | Admitting: Cardiology

## 2015-02-27 ENCOUNTER — Ambulatory Visit (INDEPENDENT_AMBULATORY_CARE_PROVIDER_SITE_OTHER): Payer: Medicare Other | Admitting: Cardiology

## 2015-02-27 VITALS — BP 148/66 | HR 73 | Ht 66.0 in | Wt 174.4 lb

## 2015-02-27 DIAGNOSIS — I2510 Atherosclerotic heart disease of native coronary artery without angina pectoris: Secondary | ICD-10-CM

## 2015-02-27 NOTE — Progress Notes (Signed)
HPI The patient presents for follow up of CAD.  I saw him many years ago. He had a previous out of hospital myocardial infarction. His last stress test was 70 with old inferior infarct with an EF of 53%. Previous cardiac catheterization was reported to describe an occluded chronic vessel with collaterals but I never had a copy of this.   Since I last saw him he had carotid endarterectomy. He was cleared preoperatively for this by Dr. Gwenlyn Found.  He did well with that surgery.  The patient denies any new symptoms such as chest discomfort, neck or arm discomfort. There has been no new shortness of breath, PND or orthopnea. There have been no reported palpitations, presyncope or syncope.  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  No Known Allergies  Current Outpatient Prescriptions  Medication Sig Dispense Refill  . ACCU-CHEK AVIVA PLUS test strip USE ONE STRIP ONCE DAILY 100 each 5  . allopurinol (ZYLOPRIM) 300 MG tablet Take 1 tablet (300 mg total) by mouth daily. 100 tablet 3  . amLODipine (NORVASC) 5 MG tablet Take 1 tablet (5 mg total) by mouth daily. 90 tablet 2  . Ascorbic Acid (VITAMIN C) 500 MG tablet Take 500 mg by mouth daily.      Marland Kitchen aspirin 325 MG tablet Take 325 mg by mouth daily.      Marland Kitchen atorvastatin (LIPITOR) 20 MG tablet Take 1 tablet (20 mg total) by mouth daily at 6 PM. 90 tablet 2  . BD PEN NEEDLE NANO U/F 32G X 4 MM MISC USE ONCE DAILY 100 each 3  . calcitRIOL (ROCALTROL) 0.5 MCG capsule Take 0.5 mcg by mouth every other day.     . furosemide (LASIX) 80 MG tablet Take 1 tablet (80 mg total) by mouth daily. (Patient taking differently: Take 160 mg by mouth daily. ) 90 tablet 3  . glipiZIDE (GLUCOTROL) 10 MG tablet One tablet daily (Patient taking differently: at bedtime. One tablet daily) 100 tablet 3  . Insulin Glargine (LANTUS SOLOSTAR) 100 UNIT/ML Solostar Pen INJECT 39 UNITS INTO THE SKIN AT BEDTIME (Patient  taking differently: Inject 40 Units into the skin at bedtime. ) 15 mL 6  . Lancets (ACCU-CHEK MULTICLIX) lancets 1 each by Other route daily. Dx 250.01 102 each 3  . metoprolol (TOPROL-XL) 200 MG 24 hr tablet TAKE ONE-HALF TABLET BY MOUTH EVERY DAY 100 tablet 3  . Multiple Vitamins-Minerals (MULTIVITAMIN WITH MINERALS) tablet Take 1 tablet by mouth daily.      . nitroGLYCERIN (NITROSTAT) 0.4 MG SL tablet Place 0.4 mg under the tongue every 5 (five) minutes as needed for chest pain.    . Omega-3 Fatty Acids (FISH OIL) 500 MG CAPS Take 1 capsule by mouth daily.    . trandolapril (MAVIK) 4 MG tablet Take 4 mg by mouth 2 (two) times daily.      No current facility-administered medications for this visit.    Past Medical History  Diagnosis Date  . CAD (coronary artery disease) 05/15/10    out of hospital myocardial infarction in the 1990's. this was picked up on an  EKG in 1997.  Cardiac catherterization demonstrated an occluded vessel and collateras.  I have no description of this.  A stress  perfusion study  done 2008 demonstrated an ejection fraction  of  53% with inferolateral  hypokinesis.  There was an inferolateral defect with  scar and some mild mixed ischemia.  managed rx  . Hyperlipidemia   . Hypertension   . PVD (peripheral vascular disease)   . ED (erectile dysfunction)   . CKD (chronic kidney disease), stage IV   . Nephrolithiasis   . Gout   . Polyp of colon   . Hx of cardiovascular stress test     a. Lexiscan myoview 10/18/12: EF 53%, inf and inf-lat scar, slight peri-infarct ischemia, small area of apical ischemia; no change from 2006  . Bilateral carotid artery disease   . Myocardial infarction   . Cancer     skin  . Diabetes mellitus     DM, type II  . History of kidney stones     Past Surgical History  Procedure Laterality Date  . Humerus fracture surgery      left  . Mohs surgery      multiple  . Cystoscopy      stone removal x 1  . Colonoscopy w/ polypectomy     . Eye surgery  06/2013    bilateral cataract surgery  . Endarterectomy Right 11/28/2014    Procedure: RIGHT CAROTID ENDARTERECTOMY;  Surgeon: Serafina Mitchell, MD;  Location: [facility];  Service: Vascular;  Laterality: Right;  . Patch angioplasty Right 11/28/2014    Procedure: PATCH ANGIOPLASTY RIGHT CAROTID;  Surgeon: Serafina Mitchell, MD;  Location: [facility] OR;  Service: Vascular;  Laterality: Right;    ROS:  As stated in the HPI and negative for all other systems.  PHYSICAL EXAM BP 148/66 mmHg  Pulse 73  Ht 5\' 6"  (1.676 m)  Wt 174 lb 6.4 oz (79.107 kg)  BMI 28.16 kg/m2 GENERAL:  Well appearing HEENT:  Pupils equal round and reactive, fundi not visualized, oral mucosa unremarkable NECK:  No jugular venous distention, waveform within normal limits, carotid upstroke brisk and symmetric, no bruits, no thyromegaly LYMPHATICS:  No cervical, inguinal adenopathy LUNGS:  Clear to auscultation bilaterally BACK:  No CVA tenderness CHEST:  Unremarkable HEART:  PMI not displaced or sustained,S1 and S2 within normal limits, no S3, no S4, no clicks, no rubs, no murmurs ABD:  Flat, positive bowel sounds normal in frequency in pitch, no bruits, no rebound, no guarding, no midline pulsatile mass, no hepatomegaly, no splenomegaly EXT:  2 plus pulses throughout, no edema, no cyanosis no clubbing SKIN:  No rashes no nodules NEURO:  Cranial nerves II through XII grossly intact, motor grossly intact throughout PSYCH:  Cognitively intact, oriented to person place and time  EKG:  Sinus rhythm, rate 92, old inferior infarct, no acute ST-T wave changes.12/17/14  ASSESSMENT AND PLAN  CAD:    The patient has no new sypmtoms.  No further cardiovascular testing is indicated.  We will continue with aggressive risk reduction and meds as listed.  He can't reduce his aspirin 81 mg daily.  DYSLIPIDEMIA:  The last LDL was 46 with an HDL of 31.4. He will remain on meds as listed.  CKD:  His creatinine is up to 3.25 but is  followed closely by Dr. Jimmy Footman.   HTN:  His blood pressure is slightly elevated but this is unusual.  I will not change his medications but he should keep a blood pressure diary.  DM:  His last A1c was 8.3. He would like to see a dietitian and I will provide him with a referral.

## 2015-02-27 NOTE — Patient Instructions (Signed)
Your physician wants you to follow-up in: 1 Year. You will receive a reminder letter in the mail two months in advance. If you don't receive a letter, please call our office to schedule the follow-up appointment.  Your physician has recommended you make the following change in your medication: Decrease Aspirin 81 mg daily  Your physician has recommended you see a Dietitian - Amy Samara Snide

## 2015-03-06 ENCOUNTER — Telehealth: Payer: Self-pay | Admitting: *Deleted

## 2015-03-06 NOTE — Telephone Encounter (Signed)
Patient referral for medical Nutrition Therapy was emailed to [employer].

## 2015-04-15 ENCOUNTER — Other Ambulatory Visit: Payer: Self-pay | Admitting: Family Medicine

## 2015-05-13 ENCOUNTER — Other Ambulatory Visit: Payer: Self-pay | Admitting: Family Medicine

## 2015-05-19 ENCOUNTER — Other Ambulatory Visit: Payer: Self-pay | Admitting: Cardiovascular Disease

## 2015-05-19 NOTE — Telephone Encounter (Signed)
Rx request sent to pharmacy.  

## 2015-05-25 ENCOUNTER — Other Ambulatory Visit: Payer: Self-pay

## 2015-05-25 DIAGNOSIS — N185 Chronic kidney disease, stage 5: Secondary | ICD-10-CM

## 2015-05-25 DIAGNOSIS — Z01810 Encounter for preprocedural cardiovascular examination: Secondary | ICD-10-CM

## 2015-06-04 ENCOUNTER — Encounter: Payer: Self-pay | Admitting: Surgery

## 2015-06-12 ENCOUNTER — Ambulatory Visit (HOSPITAL_COMMUNITY)
Admission: RE | Admit: 2015-06-12 | Discharge: 2015-06-12 | Disposition: A | Discharge status: Home / Self Care | Payer: Medicare Other | Source: Ambulatory Visit | Attending: Surgery | Admitting: Surgery

## 2015-06-12 ENCOUNTER — Other Ambulatory Visit (HOSPITAL_COMMUNITY): Payer: Medicare Other

## 2015-06-12 ENCOUNTER — Ambulatory Visit (INDEPENDENT_AMBULATORY_CARE_PROVIDER_SITE_OTHER): Payer: Medicare Other | Admitting: Surgery

## 2015-06-12 ENCOUNTER — Ambulatory Visit: Payer: Medicare Other | Admitting: Surgery

## 2015-06-12 ENCOUNTER — Encounter: Payer: Self-pay | Admitting: Surgery

## 2015-06-12 ENCOUNTER — Other Ambulatory Visit: Payer: Self-pay

## 2015-06-12 ENCOUNTER — Encounter (HOSPITAL_COMMUNITY): Payer: Medicare Other

## 2015-06-12 ENCOUNTER — Ambulatory Visit (INDEPENDENT_AMBULATORY_CARE_PROVIDER_SITE_OTHER)
Admission: RE | Admit: 2015-06-12 | Discharge: 2015-06-12 | Disposition: A | Discharge status: Home / Self Care | Payer: Medicare Other | Source: Ambulatory Visit | Attending: Surgery | Admitting: Surgery

## 2015-06-12 VITALS — BP 142/76 | HR 67 | Temp 97.3°F | Resp 18 | Ht 66.0 in | Wt 167.0 lb

## 2015-06-12 DIAGNOSIS — N185 Chronic kidney disease, stage 5: Secondary | ICD-10-CM | POA: Diagnosis not present

## 2015-06-12 DIAGNOSIS — N186 End stage renal disease: Secondary | ICD-10-CM

## 2015-06-12 DIAGNOSIS — Z01810 Encounter for preprocedural cardiovascular examination: Secondary | ICD-10-CM | POA: Diagnosis not present

## 2015-06-12 DIAGNOSIS — Z992 Dependence on renal dialysis: Secondary | ICD-10-CM

## 2015-06-12 NOTE — Progress Notes (Signed)
Patient name: Douglas Meyer MRN: [id number] DOB: 02-Apr-1941 Sex: male     Chief Complaint  Patient presents with  . New Evaluation    eval for AVF    HISTORY OF PRESENT ILLNESS:  the patient is here today for discussions regarding hemodialysis access.  He is well known to me having undergone right carotid endarterectomy earlier in 2016.  The patient is right handed. His renal failure secondary to diabetes and hypertension. His renal failure is complicated by secondary hyperparathyroidism as well as anemia. His diabetes is accompanied by retinopathy.  He has had multiple skin cancer procedures.  He is medically managed with a statin for hypercholesterolemia  Past Medical History  Diagnosis Date  . CAD (coronary artery disease) 05/15/10    out of hospital myocardial infarction in the 1990's. this was picked up on an  EKG in 1997.  Cardiac catherterization demonstrated an occluded vessel and collateras.  I have no description of this.  A stress  perfusion study  done 2008 demonstrated an ejection fraction  of  53% with inferolateral  hypokinesis.  There was an inferolateral defect with  scar and some mild mixed ischemia.   managed rx  . Hyperlipidemia   . Hypertension   . PVD (peripheral vascular disease) ([place])   . ED (erectile dysfunction)   . CKD (chronic kidney disease), stage IV ([place])   . Nephrolithiasis   . Gout   . Polyp of colon   . Hx of cardiovascular stress test     a. Lexiscan myoview 10/18/12: EF 53%, inf and inf-lat scar, slight peri-infarct ischemia, small area of apical ischemia; no change from 2006  . Bilateral carotid artery disease ([place])   . Myocardial infarction ([place])   . Cancer ([place])     skin  . Diabetes mellitus     DM, type II  . History of kidney stones     Past Surgical History  Procedure Laterality Date  . Humerus fracture surgery      left  . Mohs surgery      multiple  . Cystoscopy      stone removal x 1  . Colonoscopy w/ polypectomy    . Eye  surgery  06/2013    bilateral cataract surgery  . Endarterectomy Right 11/28/2014    Procedure: RIGHT CAROTID ENDARTERECTOMY;  Surgeon: Serafina Mitchell, MD;  Location: [place];  Service: Vascular;  Laterality: Right;  . Patch angioplasty Right 11/28/2014    Procedure: PATCH ANGIOPLASTY RIGHT CAROTID;  Surgeon: Serafina Mitchell, MD;  [facility];  Service: Vascular;  Laterality: Right;    Social History   Social History  . Marital Status: Married    Spouse Name: N/A  . Number of Children: 4  . Years of Education: N/A   Occupational History  .  [employer]    retired  .     Social History Main Topics  . Smoking status: Former Smoker -- 6 years    Quit date: 06/06/1973  . Smokeless tobacco: Never Used  . Alcohol Use: 3.6 oz/week    1 Glasses of wine, 5 Cans of beer per week  . Drug Use: No  . Sexual Activity: Not on file   Other Topics Concern  . Not on file   Social History Narrative   Lives with wife.      Family History  Problem Relation Age of Onset  . Heart attack Father     deceased 16  . Diabetes  type II Brother   . Diabetes type II Brother   . Diabetes Brother     rheumatic fever, peptic ulcer disease, deceased  . Diabetes Sister     deceased 71  . Heart attack Mother     deceased age 39s    Allergies as of 06/12/2015  . (No Known Allergies)    Current Outpatient Prescriptions on File Prior to Visit  Medication Sig Dispense Refill  . ACCU-CHEK AVIVA PLUS test strip USE ONE STRIP ONCE DAILY 100 each 5  . allopurinol (ZYLOPRIM) 300 MG tablet Take 1 tablet (300 mg total) by mouth daily. 100 tablet 3  . amLODipine (NORVASC) 5 MG tablet Take 1 tablet (5 mg total) by mouth daily. 90 tablet 2  . Ascorbic Acid (VITAMIN C) 500 MG tablet Take 500 mg by mouth daily.      Marland Kitchen aspirin 81 MG tablet Take 81 mg by mouth daily.    Marland Kitchen atorvastatin (LIPITOR) 20 MG tablet TAKE ONE TABLET BY MOUTH ONCE DAILY AT  6PM 30 tablet 3  . BD PEN NEEDLE NANO U/F 32G X 4 MM MISC USE ONCE  DAILY 100 each 3  . calcitRIOL (ROCALTROL) 0.5 MCG capsule Take 0.5 mcg by mouth every other day.     . furosemide (LASIX) 80 MG tablet Take 1 tablet (80 mg total) by mouth daily. (Patient taking differently: Take 160 mg by mouth daily. ) 90 tablet 3  . glipiZIDE (GLUCOTROL) 10 MG tablet TAKE 1 BY MOUTH DAILY 90 tablet 1  . Lancets (ACCU-CHEK MULTICLIX) lancets 1 each by Other route daily. Dx 250.01 102 each 3  . LANTUS SOLOSTAR 100 UNIT/ML Solostar Pen INJECT 39 UNITS SUBCUTANEOUSLY AT BEDTIME (Patient taking differently: 41UNITS SUBCUTANEOUSLY AT BEDTIME) 45 pen 0  . metoprolol (TOPROL-XL) 200 MG 24 hr tablet TAKE ONE-HALF TABLET BY MOUTH EVERY DAY 100 tablet 3  . Multiple Vitamins-Minerals (MULTIVITAMIN WITH MINERALS) tablet Take 1 tablet by mouth daily.      . nitroGLYCERIN (NITROSTAT) 0.4 MG SL tablet Place 0.4 mg under the tongue every 5 (five) minutes as needed for chest pain.    . Omega-3 Fatty Acids (FISH OIL) 500 MG CAPS Take 1 capsule by mouth daily.    . trandolapril (MAVIK) 4 MG tablet Take 4 mg by mouth 2 (two) times daily.      No current facility-administered medications on file prior to visit.     REVIEW OF SYSTEMS: Cardiovascular: No chest pain, chest pressure, palpitations, orthopnea, or dyspnea on exertion. No claudication or rest pain,  No history of DVT or phlebitis. Pulmonary: No productive cough, asthma or wheezing. Neurologic: No weakness, paresthesias, aphasia, or amaurosis. No dizziness. Hematologic: No bleeding problems or clotting disorders. Musculoskeletal: No joint pain or joint swelling. Gastrointestinal: No blood in stool or hematemesis Genitourinary: No dysuria or hematuria. Psychiatric:: No history of major depression. Integumentary: No rashes or ulcers. Constitutional: No fever or chills.  PHYSICAL EXAMINATION:   Vital signs are  Filed Vitals:   06/12/15 1137 06/12/15 1146  BP: 147/74 142/76  Pulse: 68 67  Temp: 97.3 F (36.3 C)   Resp: 18     Height: 5\' 6"  (1.676 m)   Weight: 167 lb (75.751 kg)   SpO2: 99%    Body mass index is 26.97 kg/(m^2). General: The patient appears their stated age. HEENT:  No gross abnormalities Pulmonary:  Non labored breathing Musculoskeletal: There are no major deformities. Neurologic: No focal weakness or paresthesias are detected, Skin: There are  no ulcer or rashes noted. Psychiatric: The patient has normal affect. Cardiovascular: There is a regular rate and rhythm without significant murmur appreciated. Palpable right radial pulse   Diagnostic Studies  I have reviewed his vein mapping.  He has an excellent right cephalic vein with the exception of one area in the mid forearm where there is down to 0.23.  The cephalic vein of left arm is not adequate.  Both basilic veins are adequate.  He has triphasic waveforms on arterial duplex  Assessment:  chronic renal insufficiency Plan:  I discussed proceeding with right arm AV fistula.  The patient does have a scar over his cephalic vein in the midforearm where the vein narrow os down to 0.23 cm. Wonder if the scar tissue is causing the vein to become more sclerotic.  I told him I would evaluate this in the operating room and perform either a radiocephalic or most likely a right brachiocephalic fistula.  We discussed the risks and benefits of the operation.  We discussed the risk f non-maturation and te need for future interventions, as well as the risk for steal syndrome.  His procedure has been scheduled for Friday, January 13  V. Leia Alf, M.D. [facility] Office: [phone] Pager:  [phone]

## 2015-06-12 NOTE — Progress Notes (Signed)
Filed Vitals:   06/12/15 1137 06/12/15 1146  BP: 147/74 142/76  Pulse: 68 67  Temp: 97.3 F (36.3 C)   Resp: 18   Height: 5\' 6"  (1.676 m)   Weight: 167 lb (75.751 kg)   SpO2: 99%

## 2015-06-15 ENCOUNTER — Other Ambulatory Visit: Payer: Self-pay | Admitting: Family Medicine

## 2015-06-18 ENCOUNTER — Encounter (HOSPITAL_COMMUNITY): Payer: Self-pay | Admitting: *Deleted

## 2015-06-18 MED ORDER — SODIUM CHLORIDE 0.9 % IV SOLN
INTRAVENOUS | Status: DC
  Administered 2015-06-19 (×2): via INTRAVENOUS

## 2015-06-18 MED ORDER — CHLORHEXIDINE GLUCONATE CLOTH 2 % EX PADS: 6.0000 | MEDICATED_PAD | Freq: Once | CUTANEOUS | Status: DC

## 2015-06-18 MED ORDER — CEFUROXIME SODIUM 1.5 G IJ SOLR: 1.5000 g | INTRAMUSCULAR | Status: DC

## 2015-06-19 ENCOUNTER — Encounter (HOSPITAL_COMMUNITY): Payer: Self-pay | Admitting: *Deleted

## 2015-06-19 ENCOUNTER — Ambulatory Visit (HOSPITAL_COMMUNITY)
Admission: RE | Admit: 2015-06-19 | Discharge: 2015-06-19 | Disposition: A | Discharge status: Home / Self Care | Payer: Medicare Other | Source: Ambulatory Visit | Attending: Surgery | Admitting: Surgery

## 2015-06-19 ENCOUNTER — Ambulatory Visit (HOSPITAL_COMMUNITY): Payer: Medicare Other | Admitting: Anesthesiology

## 2015-06-19 ENCOUNTER — Other Ambulatory Visit: Payer: Self-pay | Admitting: *Deleted

## 2015-06-19 ENCOUNTER — Telehealth: Payer: Self-pay | Admitting: Surgery

## 2015-06-19 ENCOUNTER — Encounter (HOSPITAL_COMMUNITY)
Admission: RE | Disposition: A | Discharge status: Home / Self Care | Payer: Self-pay | Source: Ambulatory Visit | Attending: Surgery

## 2015-06-19 DIAGNOSIS — N184 Chronic kidney disease, stage 4 (severe): Secondary | ICD-10-CM | POA: Insufficient documentation

## 2015-06-19 DIAGNOSIS — Z87891 Personal history of nicotine dependence: Secondary | ICD-10-CM | POA: Diagnosis not present

## 2015-06-19 DIAGNOSIS — Z7982 Long term (current) use of aspirin: Secondary | ICD-10-CM | POA: Insufficient documentation

## 2015-06-19 DIAGNOSIS — Z794 Long term (current) use of insulin: Secondary | ICD-10-CM | POA: Insufficient documentation

## 2015-06-19 DIAGNOSIS — I2510 Atherosclerotic heart disease of native coronary artery without angina pectoris: Secondary | ICD-10-CM | POA: Diagnosis not present

## 2015-06-19 DIAGNOSIS — E7800 Pure hypercholesterolemia, unspecified: Secondary | ICD-10-CM | POA: Insufficient documentation

## 2015-06-19 DIAGNOSIS — Z833 Family history of diabetes mellitus: Secondary | ICD-10-CM | POA: Diagnosis not present

## 2015-06-19 DIAGNOSIS — E1122 Type 2 diabetes mellitus with diabetic chronic kidney disease: Secondary | ICD-10-CM | POA: Insufficient documentation

## 2015-06-19 DIAGNOSIS — I252 Old myocardial infarction: Secondary | ICD-10-CM | POA: Diagnosis not present

## 2015-06-19 DIAGNOSIS — N186 End stage renal disease: Secondary | ICD-10-CM

## 2015-06-19 DIAGNOSIS — Z4931 Encounter for adequacy testing for hemodialysis: Secondary | ICD-10-CM

## 2015-06-19 DIAGNOSIS — Z85828 Personal history of other malignant neoplasm of skin: Secondary | ICD-10-CM | POA: Insufficient documentation

## 2015-06-19 DIAGNOSIS — I129 Hypertensive chronic kidney disease with stage 1 through stage 4 chronic kidney disease, or unspecified chronic kidney disease: Secondary | ICD-10-CM | POA: Insufficient documentation

## 2015-06-19 HISTORY — PX: AV FISTULA PLACEMENT: SHX1204

## 2015-06-19 LAB — GLUCOSE, CAPILLARY
GLUCOSE-CAPILLARY: 192 mg/dL — AB (ref 65–99)
GLUCOSE-CAPILLARY: 146 mg/dL — AB (ref 65–99)
Glucose-Capillary: 71 mg/dL (ref 65–99)
Glucose-Capillary: 92 mg/dL (ref 65–99)
Glucose-Capillary: 139 mg/dL — ABNORMAL HIGH (ref 65–99)
Glucose-Capillary: 57 mg/dL — ABNORMAL LOW (ref 65–99)

## 2015-06-19 LAB — POCT I-STAT 4, (NA,K, GLUC, HGB,HCT)
GLUCOSE: 73 mg/dL (ref 65–99)
HEMATOCRIT: 37 % — AB (ref 39.0–52.0)
HEMOGLOBIN: 12.6 g/dL — AB (ref 13.0–17.0)
Potassium: 3.6 mmol/L (ref 3.5–5.1)
Sodium: 137 mmol/L (ref 135–145)

## 2015-06-19 SURGERY — ARTERIOVENOUS (AV) FISTULA CREATION
Anesthesia: Monitor Anesthesia Care | Site: Arm Upper | Laterality: Right

## 2015-06-19 MED ORDER — HEPARIN SODIUM (PORCINE) 1000 UNIT/ML IJ SOLN
INTRAMUSCULAR | Status: AC
  Filled 2015-06-19: qty 1

## 2015-06-19 MED ORDER — LIDOCAINE-EPINEPHRINE (PF) 1 %-1:200000 IJ SOLN
INTRAMUSCULAR | Status: AC
  Filled 2015-06-19: qty 30

## 2015-06-19 MED ORDER — HEMOSTATIC AGENTS (NO CHARGE) OPTIME
TOPICAL | Status: DC | PRN
  Administered 2015-06-19: 1 via TOPICAL

## 2015-06-19 MED ORDER — OXYCODONE-ACETAMINOPHEN 5-325 MG PO TABS: 1.0000 | ORAL_TABLET | Freq: Four times a day (QID) | ORAL | Status: DC | PRN

## 2015-06-19 MED ORDER — DEXTROSE 50 % IV SOLN
INTRAVENOUS | Status: AC
  Filled 2015-06-19: qty 50

## 2015-06-19 MED ORDER — FENTANYL CITRATE (PF) 250 MCG/5ML IJ SOLN
INTRAMUSCULAR | Status: DC | PRN
  Administered 2015-06-19: 100 ug via INTRAVENOUS

## 2015-06-19 MED ORDER — PROMETHAZINE HCL 25 MG/ML IJ SOLN: 6.2500 mg | INTRAMUSCULAR | Status: DC | PRN

## 2015-06-19 MED ORDER — HEPARIN SODIUM (PORCINE) 1000 UNIT/ML IJ SOLN
INTRAMUSCULAR | Status: DC | PRN
  Administered 2015-06-19: 3000 [IU] via INTRAVENOUS

## 2015-06-19 MED ORDER — PROTAMINE SULFATE 10 MG/ML IV SOLN
INTRAVENOUS | Status: DC | PRN
  Administered 2015-06-19: 10 mg via INTRAVENOUS
  Administered 2015-06-19: 15 mg via INTRAVENOUS

## 2015-06-19 MED ORDER — DEXTROSE 5 % IV SOLN
10.0000 mg | INTRAVENOUS | Status: DC | PRN
Start: 2015-06-19 — End: 2015-06-19
  Administered 2015-06-19: 30 ug/min via INTRAVENOUS

## 2015-06-19 MED ORDER — LIDOCAINE HCL (CARDIAC) 20 MG/ML IV SOLN
INTRAVENOUS | Status: DC | PRN
  Administered 2015-06-19: 60 mg via INTRAVENOUS

## 2015-06-19 MED ORDER — SODIUM CHLORIDE 0.9 % IV SOLN
INTRAVENOUS | Status: DC | PRN
  Administered 2015-06-19: 500 mL

## 2015-06-19 MED ORDER — DEXTROSE 50 % IV SOLN
1.0000 | Freq: Once | INTRAVENOUS | Status: AC
  Administered 2015-06-19: 50 mL via INTRAVENOUS
  Filled 2015-06-19: qty 50

## 2015-06-19 MED ORDER — FENTANYL CITRATE (PF) 250 MCG/5ML IJ SOLN
INTRAMUSCULAR | Status: AC
  Filled 2015-06-19: qty 5

## 2015-06-19 MED ORDER — FENTANYL CITRATE (PF) 100 MCG/2ML IJ SOLN: 25.0000 ug | INTRAMUSCULAR | Status: DC | PRN

## 2015-06-19 MED ORDER — 0.9 % SODIUM CHLORIDE (POUR BTL) OPTIME
TOPICAL | Status: DC | PRN
  Administered 2015-06-19: 1000 mL

## 2015-06-19 MED ORDER — PHENYLEPHRINE HCL 10 MG/ML IJ SOLN
INTRAMUSCULAR | Status: DC | PRN
  Administered 2015-06-19: 80 ug via INTRAVENOUS

## 2015-06-19 MED ORDER — CEFUROXIME SODIUM 1.5 G IJ SOLR
INTRAMUSCULAR | Status: AC
  Filled 2015-06-19: qty 1.5

## 2015-06-19 MED ORDER — LIDOCAINE-EPINEPHRINE (PF) 1 %-1:200000 IJ SOLN
INTRAMUSCULAR | Status: DC | PRN
  Administered 2015-06-19: 6 mL

## 2015-06-19 MED ORDER — PROPOFOL 500 MG/50ML IV EMUL
INTRAVENOUS | Status: DC | PRN
Start: 2015-06-19 — End: 2015-06-19
  Administered 2015-06-19: 100 ug/kg/min via INTRAVENOUS

## 2015-06-19 MED ORDER — LIDOCAINE HCL (PF) 1 % IJ SOLN
INTRAMUSCULAR | Status: AC
  Filled 2015-06-19: qty 30

## 2015-06-19 MED ORDER — PROPOFOL 10 MG/ML IV BOLUS
INTRAVENOUS | Status: AC
  Filled 2015-06-19: qty 20

## 2015-06-19 SURGICAL SUPPLY — 32 items
ARMBAND PINK RESTRICT EXTREMIT (MISCELLANEOUS) ×2 IMPLANT
CANISTER SUCTION 2500CC (MISCELLANEOUS) ×2 IMPLANT
CLIP TI MEDIUM 6 (CLIP) ×2 IMPLANT
CLIP TI WIDE RED SMALL 6 (CLIP) ×2 IMPLANT
COVER PROBE W GEL 5X96 (DRAPES) ×2 IMPLANT
ELECT REM PT RETURN 9FT ADLT (ELECTROSURGICAL) ×2
ELECTRODE REM PT RTRN 9FT ADLT (ELECTROSURGICAL) ×1 IMPLANT
GLOVE BIOGEL PI IND STRL 6.5 (GLOVE) ×1 IMPLANT
GLOVE BIOGEL PI IND STRL 7.5 (GLOVE) ×2 IMPLANT
GLOVE BIOGEL PI IND STRL 8 (GLOVE) ×1 IMPLANT
GLOVE BIOGEL PI INDICATOR 6.5 (GLOVE) ×1
GLOVE BIOGEL PI INDICATOR 7.5 (GLOVE) ×2
GLOVE BIOGEL PI INDICATOR 8 (GLOVE) ×1
GLOVE ECLIPSE 7.0 STRL STRAW (GLOVE) ×2 IMPLANT
GLOVE SURG SS PI 7.5 STRL IVOR (GLOVE) ×2 IMPLANT
GOWN STRL REUS W/ TWL LRG LVL3 (GOWN DISPOSABLE) ×2 IMPLANT
GOWN STRL REUS W/ TWL XL LVL3 (GOWN DISPOSABLE) ×1 IMPLANT
GOWN STRL REUS W/TWL LRG LVL3 (GOWN DISPOSABLE) ×2
GOWN STRL REUS W/TWL XL LVL3 (GOWN DISPOSABLE) ×1
HEMOSTAT SNOW SURGICEL 2X4 (HEMOSTASIS) ×2 IMPLANT
KIT BASIN OR (CUSTOM PROCEDURE TRAY) ×2 IMPLANT
KIT ROOM TURNOVER OR (KITS) ×2 IMPLANT
LIQUID BAND (GAUZE/BANDAGES/DRESSINGS) ×2 IMPLANT
NS IRRIG 1000ML POUR BTL (IV SOLUTION) ×2 IMPLANT
PACK CV ACCESS (CUSTOM PROCEDURE TRAY) ×2 IMPLANT
PAD ARMBOARD 7.5X6 YLW CONV (MISCELLANEOUS) ×4 IMPLANT
SUT PROLENE 6 0 CC (SUTURE) ×2 IMPLANT
SUT VIC AB 3-0 SH 27 (SUTURE) ×1
SUT VIC AB 3-0 SH 27X BRD (SUTURE) ×1 IMPLANT
SUT VICRYL 4-0 PS2 18IN ABS (SUTURE) IMPLANT
UNDERPAD 30X30 INCONTINENT (UNDERPADS AND DIAPERS) ×2 IMPLANT
WATER STERILE IRR 1000ML POUR (IV SOLUTION) ×2 IMPLANT

## 2015-06-19 NOTE — H&P (View-Only) (Signed)
Patient name: Douglas Meyer MRN: [id number] DOB: [birth year] Sex: male     Chief Complaint  Patient presents with  . New Evaluation    eval for AVF    HISTORY OF PRESENT ILLNESS:  the patient is here today for discussions regarding hemodialysis access.  He is well known to me having undergone right carotid endarterectomy earlier in 2016.  The patient is right handed. His renal failure secondary to diabetes and hypertension. His renal failure is complicated by secondary hyperparathyroidism as well as anemia. His diabetes is accompanied by retinopathy.  He has had multiple skin cancer procedures.  He is medically managed with a statin for hypercholesterolemia  Past Medical History  Diagnosis Date  . CAD (coronary artery disease) 05/15/10    out of hospital myocardial infarction in the 1990's. this was picked up on an  EKG in 1997.  Cardiac catherterization demonstrated an occluded vessel and collateras.  I have no description of this.  A stress  perfusion study  done 2008 demonstrated an ejection fraction  of  53% with inferolateral  hypokinesis.  There was an inferolateral defect with  scar and some mild mixed ischemia.   managed rx  . Hyperlipidemia   . Hypertension   . PVD (peripheral vascular disease) ([place])   . ED (erectile dysfunction)   . CKD (chronic kidney disease), stage IV ([place])   . Nephrolithiasis   . Gout   . Polyp of colon   . Hx of cardiovascular stress test     a. Lexiscan myoview 10/18/12: EF 53%, inf and inf-lat scar, slight peri-infarct ischemia, small area of apical ischemia; no change from 2006  . Bilateral carotid artery disease ([place])   . Myocardial infarction ([place])   . Cancer ([place])     skin  . Diabetes mellitus     DM, type II  . History of kidney stones     Past Surgical History  Procedure Laterality Date  . Humerus fracture surgery      left  . Mohs surgery      multiple  . Cystoscopy      stone removal x 1  . Colonoscopy w/ polypectomy    . Eye  surgery  06/2013    bilateral cataract surgery  . Endarterectomy Right 11/28/2014    Procedure: RIGHT CAROTID ENDARTERECTOMY;  Surgeon: Serafina Mitchell, MD;  Location: [place];  Service: Vascular;  Laterality: Right;  . Patch angioplasty Right 11/28/2014    Procedure: PATCH ANGIOPLASTY RIGHT CAROTID;  Surgeon: Serafina Mitchell, MD;  Location: [facility] OR;  Service: Vascular;  Laterality: Right;    Social History   Social History  . Marital Status: Married    Spouse Name: N/A  . Number of Children: 4  . Years of Education: N/A   Occupational History  .  [employer]    retired  .     Social History Main Topics  . Smoking status: Former Smoker -- 6 years    Quit date: 06/06/1973  . Smokeless tobacco: Never Used  . Alcohol Use: 3.6 oz/week    1 Glasses of wine, 5 Cans of beer per week  . Drug Use: No  . Sexual Activity: Not on file   Other Topics Concern  . Not on file   Social History Narrative   Lives with wife.      Family History  Problem Relation Age of Onset  . Heart attack Father     deceased 38  . Diabetes  type II Brother   . Diabetes type II Brother   . Diabetes Brother     rheumatic fever, peptic ulcer disease, deceased  . Diabetes Sister     deceased 59  . Heart attack Mother     deceased age 86s    Allergies as of 06/12/2015  . (No Known Allergies)    Current Outpatient Prescriptions on File Prior to Visit  Medication Sig Dispense Refill  . ACCU-CHEK AVIVA PLUS test strip USE ONE STRIP ONCE DAILY 100 each 5  . allopurinol (ZYLOPRIM) 300 MG tablet Take 1 tablet (300 mg total) by mouth daily. 100 tablet 3  . amLODipine (NORVASC) 5 MG tablet Take 1 tablet (5 mg total) by mouth daily. 90 tablet 2  . Ascorbic Acid (VITAMIN C) 500 MG tablet Take 500 mg by mouth daily.      Marland Kitchen aspirin 81 MG tablet Take 81 mg by mouth daily.    Marland Kitchen atorvastatin (LIPITOR) 20 MG tablet TAKE ONE TABLET BY MOUTH ONCE DAILY AT  6PM 30 tablet 3  . BD PEN NEEDLE NANO U/F 32G X 4 MM MISC USE ONCE  DAILY 100 each 3  . calcitRIOL (ROCALTROL) 0.5 MCG capsule Take 0.5 mcg by mouth every other day.     . furosemide (LASIX) 80 MG tablet Take 1 tablet (80 mg total) by mouth daily. (Patient taking differently: Take 160 mg by mouth daily. ) 90 tablet 3  . glipiZIDE (GLUCOTROL) 10 MG tablet TAKE 1 BY MOUTH DAILY 90 tablet 1  . Lancets (ACCU-CHEK MULTICLIX) lancets 1 each by Other route daily. Dx 250.01 102 each 3  . LANTUS SOLOSTAR 100 UNIT/ML Solostar Pen INJECT 39 UNITS SUBCUTANEOUSLY AT BEDTIME (Patient taking differently: 41UNITS SUBCUTANEOUSLY AT BEDTIME) 45 pen 0  . metoprolol (TOPROL-XL) 200 MG 24 hr tablet TAKE ONE-HALF TABLET BY MOUTH EVERY DAY 100 tablet 3  . Multiple Vitamins-Minerals (MULTIVITAMIN WITH MINERALS) tablet Take 1 tablet by mouth daily.      . nitroGLYCERIN (NITROSTAT) 0.4 MG SL tablet Place 0.4 mg under the tongue every 5 (five) minutes as needed for chest pain.    . Omega-3 Fatty Acids (FISH OIL) 500 MG CAPS Take 1 capsule by mouth daily.    . trandolapril (MAVIK) 4 MG tablet Take 4 mg by mouth 2 (two) times daily.      No current facility-administered medications on file prior to visit.     REVIEW OF SYSTEMS: Cardiovascular: No chest pain, chest pressure, palpitations, orthopnea, or dyspnea on exertion. No claudication or rest pain,  No history of DVT or phlebitis. Pulmonary: No productive cough, asthma or wheezing. Neurologic: No weakness, paresthesias, aphasia, or amaurosis. No dizziness. Hematologic: No bleeding problems or clotting disorders. Musculoskeletal: No joint pain or joint swelling. Gastrointestinal: No blood in stool or hematemesis Genitourinary: No dysuria or hematuria. Psychiatric:: No history of major depression. Integumentary: No rashes or ulcers. Constitutional: No fever or chills.  PHYSICAL EXAMINATION:   Vital signs are  Filed Vitals:   06/12/15 1137 06/12/15 1146  BP: 147/74 142/76  Pulse: 68 67  Temp: 97.3 F (36.3 C)   Resp: 18     Height: 5\' 6"  (1.676 m)   Weight: 167 lb (75.751 kg)   SpO2: 99%    Body mass index is 26.97 kg/(m^2). General: The patient appears their stated age. HEENT:  No gross abnormalities Pulmonary:  Non labored breathing Musculoskeletal: There are no major deformities. Neurologic: No focal weakness or paresthesias are detected, Skin: There are  no ulcer or rashes noted. Psychiatric: The patient has normal affect. Cardiovascular: There is a regular rate and rhythm without significant murmur appreciated. Palpable right radial pulse   Diagnostic Studies  I have reviewed his vein mapping.  He has an excellent right cephalic vein with the exception of one area in the mid forearm where there is down to 0.23.  The cephalic vein of left arm is not adequate.  Both basilic veins are adequate.  He has triphasic waveforms on arterial duplex  Assessment:  chronic renal insufficiency Plan:  I discussed proceeding with right arm AV fistula.  The patient does have a scar over his cephalic vein in the midforearm where the vein narrow os down to 0.23 cm. Wonder if the scar tissue is causing the vein to become more sclerotic.  I told him I would evaluate this in the operating room and perform either a radiocephalic or most likely a right brachiocephalic fistula.  We discussed the risks and benefits of the operation.  We discussed the risk f non-maturation and te need for future interventions, as well as the risk for steal syndrome.  His procedure has been scheduled for Friday, January 13  V. Leia Alf, M.D. [facility] [place] Office: [phone] Pager:  [phone]

## 2015-06-19 NOTE — Interval H&P Note (Signed)
History and Physical Interval Note:  06/19/2015 10:26 AM  Douglas Meyer  has presented today for surgery, with the diagnosis of Stage IV Chronic Kidney Disease N18.4  The various methods of treatment have been discussed with the patient and family. After consideration of risks, benefits and other options for treatment, the patient has consented to  Procedure(s): ARTERIOVENOUS (AV) FISTULA CREATION (Right) as a surgical intervention .  The patient's history has been reviewed, patient examined, no change in status, stable for surgery.  I have reviewed the patient's chart and labs.  Questions were answered to the patient's satisfaction.     [name]

## 2015-06-19 NOTE — Telephone Encounter (Signed)
-----   Message from Mena Goes, RN sent at 06/19/2015  3:16 PM EST ----- Regarding: schedule   ----- Message -----    From: Ulyses Amor, PA-C    Sent: 06/19/2015   2:30 PM      To: [facility] Charge Pool  F/U in 6 weeks with Dr. Trula Slade s/p right av fistula with duplex

## 2015-06-19 NOTE — Progress Notes (Signed)
[facility] Dr Rose called and informed new orders noted. 

## 2015-06-19 NOTE — Transfer of Care (Signed)
Immediate Anesthesia Transfer of Care Note  Patient: Douglas Meyer  Procedure(s) Performed: Procedure(s): ARTERIOVENOUS (AV) FISTULA CREATION (Right)  Patient Location: PACU  Anesthesia Type:MAC  Level of Consciousness: awake, alert , oriented and patient cooperative  Airway & Oxygen Therapy: Patient Spontanous Breathing and Patient connected to face mask oxygen  Post-op Assessment: Report given to RN, Post -op Vital signs reviewed and stable and Patient moving all extremities  Post vital signs: Reviewed and stable  Last Vitals:  Filed Vitals:   06/19/15 0747  BP: 129/60  Pulse: 65  Temp: 0000000 C    Complications: No apparent anesthesia complications

## 2015-06-19 NOTE — Telephone Encounter (Signed)
LM for pt re appt, dpm °

## 2015-06-19 NOTE — Op Note (Signed)
    Patient name: Douglas Meyer MRN: [id number] DOB: [birth year] Sex: male  06/19/2015 Pre-operative Diagnosis: Chronic renal insufficiency Post-operative diagnosis:  Same Surgeon:  [name] Assistants:  Gerri Lins Procedure:   Right brachiocephalic fistula Anesthesia:  Mac Blood Loss:  See anesthesia record Specimens:  None  Findings:  Excellent 4 mm vein and artery  Indications:  Patient comes in today for access planning.  The cephalic vein in the right arm looked adequate by venous ultrasound however there was some narrowing in the upper forearm associated with a scarred area.  He is here today free the radiocephalic or brachiocephalic fistula  Procedure:  The patient was identified in the holding area and taken to [place] [facility]  The patient was then placed supine on the table. MAC anesthesia was administered.  The patient was prepped and draped in the usual sterile fashion.  A time out was called and antibiotics were administered.  I evaluated the cephalic vein with ultrasound and felt that the brachiocephalic fistula was a better option.  One percent lidocaine was used for local anesthesia.  A transverse incision was made just proximal to the antecubital crease.  I dissected out the brachial artery which was a healthy disease-free 5 mm artery.  I then dissected out the cephalic vein which measured approximately 4 mm.  There was some scar tissue around the vein.  It was mobilized throughout the width of the incision.  3000 units of heparin were given.  After the heparin circulated the artery was occluded with Serafin clamps.  A #11 blade was used to make an arteriotomy which was extended longitudinally with Potts scissors.  The vein was then marked for orientation and ligated distally.  It was flushed with heparin saline and distended nicely.  It was cut the appropriate length and then spatulated.  A running end-to-side anastomosis was created with 6-0 Prolene.  Prior to completion, the  appropriate flushing maneuvers were performed and the anastomosis was completed.  I inspected the course of the vein to make sure there were no kinks.  There was excellent palpable thrill within the fistula as well as a palpable radial pulse.  These were confirmed with Doppler.  25 mg of protamine was given.  Hemostasis was achieved and the incision was closed with a layer of 3-0 Vicryl followed by a layer of 4-0 Vicryl and Dermabond.   Disposition:  To PACU in stable condition.   Theotis Burrow, M.D. [facility] [place] Office: [phone] Pager:  [phone]

## 2015-06-19 NOTE — Anesthesia Preprocedure Evaluation (Addendum)
Anesthesia Evaluation  Patient identified by MRN, date of birth, ID band Patient awake    Reviewed: Allergy & Precautions, NPO status , Patient's Chart, lab work & pertinent test results  Airway Mallampati: II  TM Distance: >3 FB Neck ROM: Full    Dental no notable dental hx.    Pulmonary neg pulmonary ROS, former smoker,    Pulmonary exam normal breath sounds clear to auscultation       Cardiovascular hypertension, + CAD, + Past MI and + Peripheral Vascular Disease  Normal cardiovascular exam Rhythm:Regular Rate:Normal     Neuro/Psych negative neurological ROS  negative psych ROS   GI/Hepatic negative GI ROS, Neg liver ROS,   Endo/Other  diabetes  Renal/GU Renal disease  negative genitourinary   Musculoskeletal negative musculoskeletal ROS (+)   Abdominal   Peds negative pediatric ROS (+)  Hematology negative hematology ROS (+)   Anesthesia Other Findings   Reproductive/Obstetrics negative OB ROS                            Anesthesia Physical Anesthesia Plan  ASA: III  Anesthesia Plan: MAC   Post-op Pain Management:    Induction: Intravenous  Airway Management Planned: Simple Face Mask  Additional Equipment:   Intra-op Plan:   Post-operative Plan:   Informed Consent: I have reviewed the patients History and Physical, chart, labs and discussed the procedure including the risks, benefits and alternatives for the proposed anesthesia with the patient or authorized representative who has indicated his/her understanding and acceptance.   Dental advisory given  Plan Discussed with: CRNA and Surgeon  Anesthesia Plan Comments:         Anesthesia Quick Evaluation

## 2015-06-22 ENCOUNTER — Encounter (HOSPITAL_COMMUNITY): Payer: Self-pay | Admitting: Surgery

## 2015-06-30 NOTE — Anesthesia Postprocedure Evaluation (Signed)
Anesthesia Post Note  Patient: Douglas Meyer  Procedure(s) Performed: Procedure(s) (LRB): ARTERIOVENOUS (AV) FISTULA CREATION (Right)  Patient location during evaluation: PACU Anesthesia Type: General Level of consciousness: awake and alert Pain management: pain level controlled Vital Signs Assessment: post-procedure vital signs reviewed and stable Respiratory status: spontaneous breathing, nonlabored ventilation, respiratory function stable and patient connected to nasal cannula oxygen Cardiovascular status: blood pressure returned to baseline and stable Postop Assessment: no signs of nausea or vomiting Anesthetic complications: no    Last Vitals:  Filed Vitals:   06/19/15 1550 06/19/15 1553  BP: 114/54   Pulse: 60   Temp:  36.8 C  Resp: 15     Last Pain:  Filed Vitals:   06/21/15 1456  PainSc: 0-No pain                 Gerber Penza S

## 2015-07-29 ENCOUNTER — Encounter: Payer: Self-pay | Admitting: Surgery

## 2015-08-03 ENCOUNTER — Ambulatory Visit (HOSPITAL_COMMUNITY)
Admission: RE | Admit: 2015-08-03 | Discharge: 2015-08-03 | Disposition: A | Discharge status: Home / Self Care | Payer: Medicare Other | Source: Ambulatory Visit | Attending: Surgery | Admitting: Surgery

## 2015-08-03 ENCOUNTER — Ambulatory Visit (INDEPENDENT_AMBULATORY_CARE_PROVIDER_SITE_OTHER): Payer: Medicare Other | Admitting: Surgery

## 2015-08-03 ENCOUNTER — Encounter: Payer: Self-pay | Admitting: Surgery

## 2015-08-03 VITALS — BP 137/66 | HR 66 | Temp 98.6°F | Ht 66.0 in | Wt 165.5 lb

## 2015-08-03 DIAGNOSIS — E785 Hyperlipidemia, unspecified: Secondary | ICD-10-CM | POA: Diagnosis not present

## 2015-08-03 DIAGNOSIS — Z992 Dependence on renal dialysis: Secondary | ICD-10-CM

## 2015-08-03 DIAGNOSIS — E1122 Type 2 diabetes mellitus with diabetic chronic kidney disease: Secondary | ICD-10-CM | POA: Diagnosis not present

## 2015-08-03 DIAGNOSIS — Z4931 Encounter for adequacy testing for hemodialysis: Secondary | ICD-10-CM | POA: Diagnosis not present

## 2015-08-03 DIAGNOSIS — R0989 Other specified symptoms and signs involving the circulatory and respiratory systems: Secondary | ICD-10-CM | POA: Diagnosis not present

## 2015-08-03 DIAGNOSIS — I120 Hypertensive chronic kidney disease with stage 5 chronic kidney disease or end stage renal disease: Secondary | ICD-10-CM | POA: Diagnosis not present

## 2015-08-03 DIAGNOSIS — N186 End stage renal disease: Secondary | ICD-10-CM

## 2015-08-03 NOTE — Progress Notes (Signed)
Patient name: Douglas Meyer MRN: [id number] DOB: 1941/02/05 Sex: male  REASON FOR VISIT: Postop  HPI: Douglas Meyer is a 75 y.o. male who presents for his first postoperative visit status post right brachiocephalic fistula on 123456. The patient is not yet on dialysis. His primary nephrologist is Dr.Deterding. The patient denies any pain or numbness with his right arm or hand.  Current Outpatient Prescriptions  Medication Sig Dispense Refill  . ACCU-CHEK AVIVA PLUS test strip USE ONE STRIP ONCE DAILY 100 each 5  . allopurinol (ZYLOPRIM) 300 MG tablet TAKE 1 BY MOUTH DAILY 90 tablet 0  . amLODipine (NORVASC) 5 MG tablet Take 1 tablet (5 mg total) by mouth daily. 90 tablet 2  . Ascorbic Acid (VITAMIN C) 500 MG tablet Take 500 mg by mouth daily.      Marland Kitchen aspirin 81 MG tablet Take 81 mg by mouth daily.    Marland Kitchen atorvastatin (LIPITOR) 20 MG tablet TAKE ONE TABLET BY MOUTH ONCE DAILY AT  6PM 30 tablet 3  . BD PEN NEEDLE NANO U/F 32G X 4 MM MISC USE ONCE DAILY 100 each 3  . calcitRIOL (ROCALTROL) 0.5 MCG capsule Take 0.5 mcg by mouth every other day.     . furosemide (LASIX) 80 MG tablet Take 1 tablet (80 mg total) by mouth daily. (Patient taking differently: Take 160 mg by mouth daily. ) 90 tablet 3  . glipiZIDE (GLUCOTROL) 10 MG tablet TAKE 1 BY MOUTH DAILY 90 tablet 1  . Lancets (ACCU-CHEK MULTICLIX) lancets 1 each by Other route daily. Dx 250.01 102 each 3  . LANTUS SOLOSTAR 100 UNIT/ML Solostar Pen INJECT 39 UNITS SUBCUTANEOUSLY AT BEDTIME (Patient taking differently: 41UNITS SUBCUTANEOUSLY AT BEDTIME) 45 pen 0  . metoprolol (TOPROL-XL) 200 MG 24 hr tablet TAKE ONE-HALF TABLET BY MOUTH EVERY DAY 100 tablet 3  . Multiple Vitamins-Minerals (MULTIVITAMIN WITH MINERALS) tablet Take 1 tablet by mouth daily.      . nitroGLYCERIN (NITROSTAT) 0.4 MG SL tablet Place 0.4 mg under the tongue every 5 (five) minutes as needed for chest pain.    . Omega-3 Fatty Acids (FISH OIL) 500 MG CAPS Take 1 capsule by  mouth daily.    . trandolapril (MAVIK) 4 MG tablet Take 4 mg by mouth 2 (two) times daily.     Marland Kitchen oxyCODONE-acetaminophen (PERCOCET/ROXICET) 5-325 MG tablet Take 1 tablet by mouth every 6 (six) hours as needed. (Patient not taking: Reported on 08/03/2015) 20 tablet 0   No current facility-administered medications for this visit.    REVIEW OF SYSTEMS:  [X]  denotes positive finding, [ ]  denotes negative finding Cardiac  Comments:  Chest pain or chest pressure:    Shortness of breath upon exertion:    Short of breath when lying flat:    Irregular heart rhythm:    Constitutional    Fever or chills:      PHYSICAL EXAM: Filed Vitals:   08/03/15 1508  BP: 137/66  Pulse: 66  Temp: 98.6 F (37 C)  TempSrc: Oral  Height: 5\' 6"  (1.676 m)  Weight: 165 lb 8 oz (75.07 kg)  SpO2: 99%    GENERAL: The patient is a well-nourished male, in no acute distress. The vital signs are documented above. VASCULAR: Easily palpable thrill right upper arm fistula. Incision is healing well. Sensation intact and right hand. 5 out of 5 grip strength right hand. Palpable right radial pulse.  DATA: Right upper extremity dialysis duplex 08/03/2015 Diameter of 0.55 cm at the anastomosis. Diameters  of 0.83 at mid-arm and 0.74 and proximal arm. Depth of the fistula is less than 1 cm.  MEDICAL ISSUES: Status post right brachiocephalic fistula 123456  The patient's fistula is maturing well. This fistula may be used 3 months from the surgery date. He is not yet on dialysis. He will follow-up on an as-needed basis.  Virgina Jock, PA-C [facility]      I agree with the above.  I have seen and evaluated the patient. He is back for follow-up.  He is status post right brachiocephalic fistula on 123456.  He has an excellent thrill within his fistula.  Ultrasound today shows the vein diameters to range from 0.55-0.83 cm. Depths are appropriate. I think he has an excellent  fistula which will be mature an approximately 6 weeks.  At that time it will be ready for use  Douglas Meyer

## 2015-09-09 ENCOUNTER — Other Ambulatory Visit: Payer: Self-pay | Admitting: *Deleted

## 2015-09-09 MED ORDER — INSULIN GLARGINE 100 UNIT/ML SOLOSTAR PEN: PEN_INJECTOR | SUBCUTANEOUS | Status: DC

## 2015-09-11 ENCOUNTER — Encounter: Payer: Self-pay | Admitting: Family

## 2015-09-15 ENCOUNTER — Other Ambulatory Visit: Payer: Self-pay | Admitting: *Deleted

## 2015-09-15 MED ORDER — ALLOPURINOL 300 MG PO TABS: ORAL_TABLET | ORAL | Status: DC

## 2015-09-16 ENCOUNTER — Telehealth: Payer: Self-pay

## 2015-09-16 NOTE — Telephone Encounter (Signed)
Prior auth for Metoprolol succ ER 200mg  submitted to [place].

## 2015-09-18 ENCOUNTER — Other Ambulatory Visit: Payer: Self-pay | Admitting: Cardiovascular Disease

## 2015-09-18 NOTE — Telephone Encounter (Signed)
REFILL 

## 2015-09-20 ENCOUNTER — Other Ambulatory Visit: Payer: Self-pay | Admitting: Cardiovascular Disease

## 2015-09-21 ENCOUNTER — Ambulatory Visit (HOSPITAL_COMMUNITY)
Admission: RE | Admit: 2015-09-21 | Discharge: 2015-09-21 | Disposition: A | Discharge status: Home / Self Care | Payer: Medicare Other | Source: Ambulatory Visit | Attending: Surgery | Admitting: Surgery

## 2015-09-21 ENCOUNTER — Encounter: Payer: Self-pay | Admitting: Surgery

## 2015-09-21 ENCOUNTER — Ambulatory Visit (INDEPENDENT_AMBULATORY_CARE_PROVIDER_SITE_OTHER): Payer: Medicare Other | Admitting: Surgery

## 2015-09-21 VITALS — BP 140/67 | HR 68 | Temp 97.1°F | Resp 14 | Ht 66.0 in | Wt 162.0 lb

## 2015-09-21 DIAGNOSIS — Z9889 Other specified postprocedural states: Secondary | ICD-10-CM

## 2015-09-21 DIAGNOSIS — I129 Hypertensive chronic kidney disease with stage 1 through stage 4 chronic kidney disease, or unspecified chronic kidney disease: Secondary | ICD-10-CM | POA: Insufficient documentation

## 2015-09-21 DIAGNOSIS — I6523 Occlusion and stenosis of bilateral carotid arteries: Secondary | ICD-10-CM

## 2015-09-21 DIAGNOSIS — E785 Hyperlipidemia, unspecified: Secondary | ICD-10-CM | POA: Insufficient documentation

## 2015-09-21 DIAGNOSIS — E1122 Type 2 diabetes mellitus with diabetic chronic kidney disease: Secondary | ICD-10-CM | POA: Insufficient documentation

## 2015-09-21 DIAGNOSIS — N189 Chronic kidney disease, unspecified: Secondary | ICD-10-CM | POA: Insufficient documentation

## 2015-09-21 NOTE — Progress Notes (Signed)
Patient name: Douglas Meyer MRN: [id number] DOB: 1940/12/26 Sex: male     Chief Complaint  Patient presents with  . Carotid    9 month follow up with labs today,  No new medical conditions or surgeries since last seen.     Has not started HD yet, has right brachiocephalic AVF     HISTORY OF PRESENT ILLNESS: The patient is back for follow-up.  He is status post right carotid endarterectomy on 11/28/2014 for asymptomatic right carotid stenosis.  Intraoperative findings including a plaque that extended approximately 4 cm into the distal internal carotid artery.  A full 6 cm patch was used for patch angioplasty.  The patient continues to do well without neurologic symptoms.  He has a known left carotid stenosis  Patient suffers chronic renal insufficiency.  He is status post right brachiocephalic fistula on 0000000.  He is not yet on dialysis.  There is no evidence of steal or swelling in his arm  Past Medical History  Diagnosis Date  . CAD (coronary artery disease) 05/15/10    out of hospital myocardial infarction in the 1990's. this was picked up on an  EKG in 1997.  Cardiac catherterization demonstrated an occluded vessel and collateras.  I have no description of this.  A stress  perfusion study  done 2008 demonstrated an ejection fraction  of  53% with inferolateral  hypokinesis.  There was an inferolateral defect with  scar and some mild mixed ischemia.   managed rx  . Hyperlipidemia   . Hypertension   . PVD (peripheral vascular disease) ([place])   . ED (erectile dysfunction)   . CKD (chronic kidney disease), stage IV ([place])   . Nephrolithiasis   . Gout   . Polyp of colon   . Hx of cardiovascular stress test     a. Lexiscan myoview 10/18/12: EF 53%, inf and inf-lat scar, slight peri-infarct ischemia, small area of apical ischemia; no change from 2006  . Bilateral carotid artery disease ([place])   . Myocardial infarction ([place])   . Cancer ([place])     skin  . Diabetes mellitus     DM,  type II  . History of kidney stones     Past Surgical History  Procedure Laterality Date  . Humerus fracture surgery      left  . Mohs surgery      multiple  . Cystoscopy      stone removal x 1  . Colonoscopy w/ polypectomy    . Eye surgery  06/2013    bilateral cataract surgery  . Endarterectomy Right 11/28/2014    Procedure: RIGHT CAROTID ENDARTERECTOMY;  Surgeon: Serafina Mitchell, MD;  Location: [facility];  Service: Vascular;  Laterality: Right;  . Patch angioplasty Right 11/28/2014    Procedure: PATCH ANGIOPLASTY RIGHT CAROTID;  Surgeon: Serafina Mitchell, MD;  Location: [facility];  Service: Vascular;  Laterality: Right;  . Av fistula placement Right 06/19/2015    Procedure: ARTERIOVENOUS (AV) FISTULA CREATION;  Surgeon: Serafina Mitchell, MD;  Location: [facility];  Service: Vascular;  Laterality: Right;    Social History   Social History  . Marital Status: Married    Spouse Name: N/A  . Number of Children: 4  . Years of Education: N/A   Occupational History  .  [employer]    retired  .     Social History Main Topics  . Smoking status: Former Smoker -- 6 years    Quit date: 06/06/1973  .  Smokeless tobacco: Never Used  . Alcohol Use: 3.6 oz/week    1 Glasses of wine, 5 Cans of beer per week  . Drug Use: No  . Sexual Activity: Not on file   Other Topics Concern  . Not on file   Social History Narrative   Lives with wife.      Family History  Problem Relation Age of Onset  . Heart attack Father     deceased 39  . Diabetes type II Brother   . Diabetes type II Brother   . Diabetes Brother     rheumatic fever, peptic ulcer disease, deceased  . Diabetes Sister     deceased 92  . Heart attack Mother     deceased age 43s    Allergies as of 09/21/2015  . (No Known Allergies)    Current Outpatient Prescriptions on File Prior to Visit  Medication Sig Dispense Refill  . ACCU-CHEK AVIVA PLUS test strip USE ONE STRIP ONCE DAILY 100 each 5  . allopurinol (ZYLOPRIM) 300 MG tablet  TAKE 1 BY MOUTH DAILY 90 tablet 3  . amLODipine (NORVASC) 5 MG tablet Take 1 tablet (5 mg total) by mouth daily. 90 tablet 2  . Ascorbic Acid (VITAMIN C) 500 MG tablet Take 500 mg by mouth daily.      Marland Kitchen aspirin 81 MG tablet Take 81 mg by mouth daily.    Marland Kitchen atorvastatin (LIPITOR) 20 MG tablet TAKE ONE TABLET BY MOUTH ONCE DAILY AT  6PM 30 tablet 0  . atorvastatin (LIPITOR) 20 MG tablet TAKE ONE TABLET BY MOUTH ONCE DAILY AT  6PM 30 tablet 5  . BD PEN NEEDLE NANO U/F 32G X 4 MM MISC USE ONCE DAILY 100 each 3  . calcitRIOL (ROCALTROL) 0.5 MCG capsule Take 0.5 mcg by mouth every other day.     . furosemide (LASIX) 80 MG tablet Take 1 tablet (80 mg total) by mouth daily. (Patient taking differently: Take 160 mg by mouth daily. ) 90 tablet 3  . glipiZIDE (GLUCOTROL) 10 MG tablet TAKE 1 BY MOUTH DAILY 90 tablet 1  . Insulin Glargine (LANTUS SOLOSTAR) 100 UNIT/ML Solostar Pen 41UNITS SUBCUTANEOUSLY AT BEDTIME 45 pen 0  . Lancets (ACCU-CHEK MULTICLIX) lancets 1 each by Other route daily. Dx 250.01 102 each 3  . metoprolol (TOPROL-XL) 200 MG 24 hr tablet TAKE ONE-HALF TABLET BY MOUTH EVERY DAY 100 tablet 3  . Multiple Vitamins-Minerals (MULTIVITAMIN WITH MINERALS) tablet Take 1 tablet by mouth daily.      . nitroGLYCERIN (NITROSTAT) 0.4 MG SL tablet Place 0.4 mg under the tongue every 5 (five) minutes as needed for chest pain.    . Omega-3 Fatty Acids (FISH OIL) 500 MG CAPS Take 1 capsule by mouth daily.    Marland Kitchen oxyCODONE-acetaminophen (PERCOCET/ROXICET) 5-325 MG tablet Take 1 tablet by mouth every 6 (six) hours as needed. (Patient not taking: Reported on 08/03/2015) 20 tablet 0  . trandolapril (MAVIK) 4 MG tablet Take 4 mg by mouth 2 (two) times daily.      No current facility-administered medications on file prior to visit.     REVIEW OF SYSTEMS: Please see history of present illness for pertinent positives and negatives   PHYSICAL EXAMINATION:   Vital signs are  Filed Vitals:   09/21/15 1559  BP:  140/67  Pulse: 68  Temp: 97.1 F (36.2 C)  TempSrc: Oral  Resp: 14  Height: 5\' 6"  (1.676 m)  Weight: 162 lb (73.483 kg)  SpO2: 95%  Body mass index is 26.16 kg/(m^2). General: The patient appears their stated age. HEENT:  No gross abnormalities Pulmonary:  Non labored breathing Abdomen: Soft and non-tender Musculoskeletal: There are no major deformities. Neurologic: No focal weakness or paresthesias are detected, Skin: There are no ulcer or rashes noted. Psychiatric: The patient has normal affect. Cardiovascular: Excellent thrill within right brachiocephalic fistula   Diagnostic Studies I have evaluated her duplex.  The right carotid endarterectomy site is widely patent.  The left carotid shows a 60-79 percent stenosis.  This may be underestimated due to shadowing.   Assessment: #1: End-stage renal disease #2: Carotid stenosis  Plan: #1: Patient is not on dialysis, however when he requires renal replacement therapy, his fistula is ready to use  #2: The left carotid stenosis is in the 60-79 percent stenosis.  This may be underestimated.  I discussed this with the patient.  At this time I do not feel he is a good candidate for further imaging to confirm the degree of stenosis, given his underlying renal issues.  He will contact me if he develops symptoms, otherwise I will use the ultrasound  percent stenosis to make clinical decisions.  He will follow up in 6 months with a repeat ultrasound     V. Leia Alf, M.D. [facility] Office: [phone] Pager:  [phone]

## 2015-09-21 NOTE — Telephone Encounter (Signed)
Rx(s) sent to pharmacy electronically.  

## 2015-10-15 ENCOUNTER — Telehealth: Payer: Self-pay | Admitting: General Practice

## 2015-10-15 MED ORDER — GLIPIZIDE 10 MG PO TABS: ORAL_TABLET | ORAL | Status: DC

## 2015-10-15 NOTE — Telephone Encounter (Signed)
Refill sent.

## 2015-10-16 ENCOUNTER — Other Ambulatory Visit: Payer: Self-pay

## 2015-10-16 MED ORDER — TRANDOLAPRIL 4 MG PO TABS: 4.0000 mg | ORAL_TABLET | Freq: Two times a day (BID) | ORAL | Status: DC

## 2015-10-30 NOTE — Addendum Note (Signed)
Addended by: Thresa Ross C on: 10/30/2015 11:43 AM   Modules accepted: Orders

## 2015-11-12 ENCOUNTER — Other Ambulatory Visit: Payer: Self-pay | Admitting: Family Medicine

## 2015-11-13 NOTE — Telephone Encounter (Signed)
Rx refill sent to pharmacy. 

## 2015-12-15 ENCOUNTER — Other Ambulatory Visit: Payer: Self-pay

## 2015-12-15 DIAGNOSIS — I10 Essential (primary) hypertension: Secondary | ICD-10-CM

## 2015-12-15 MED ORDER — METOPROLOL SUCCINATE ER 200 MG PO TB24: ORAL_TABLET | ORAL | Status: DC

## 2015-12-15 NOTE — Telephone Encounter (Signed)
02/27/2015 4:38 PM After Visit Summary Printed Douglas Meyer      Patient Instructions     Your physician wants you to follow-up in: 1 Year. You will receive a reminder letter in the mail two months in advance. If you don't receive a letter, please call our office to schedule the follow-up appointment.   Pt needs to schedule 12 month follow up. Will refill for 60 days. Pt will need to schedule 12 month follow up appt to receive further refills.

## 2016-01-15 ENCOUNTER — Other Ambulatory Visit: Payer: Self-pay | Admitting: *Deleted

## 2016-01-15 MED ORDER — INSULIN GLARGINE 100 UNIT/ML SOLOSTAR PEN: PEN_INJECTOR | SUBCUTANEOUS | 1 refills | Status: DC

## 2016-01-15 NOTE — Telephone Encounter (Signed)
Rx done. 

## 2016-01-17 ENCOUNTER — Other Ambulatory Visit: Payer: Self-pay | Admitting: Family Medicine

## 2016-02-18 ENCOUNTER — Other Ambulatory Visit: Payer: Self-pay | Admitting: Cardiology

## 2016-02-19 NOTE — Telephone Encounter (Signed)
Rx request sent to pharmacy.  

## 2016-03-16 ENCOUNTER — Encounter: Payer: Self-pay | Admitting: Family

## 2016-03-16 ENCOUNTER — Ambulatory Visit (INDEPENDENT_AMBULATORY_CARE_PROVIDER_SITE_OTHER): Payer: Medicare Other | Admitting: Family

## 2016-03-16 ENCOUNTER — Ambulatory Visit (HOSPITAL_COMMUNITY)
Admission: RE | Admit: 2016-03-16 | Discharge: 2016-03-16 | Disposition: A | Discharge status: Home / Self Care | Payer: Medicare Other | Source: Ambulatory Visit | Attending: Surgery | Admitting: Surgery

## 2016-03-16 VITALS — BP 136/67 | HR 69 | Temp 98.0°F | Resp 14 | Ht 66.0 in | Wt 164.0 lb

## 2016-03-16 DIAGNOSIS — I6521 Occlusion and stenosis of right carotid artery: Secondary | ICD-10-CM

## 2016-03-16 DIAGNOSIS — Z9889 Other specified postprocedural states: Secondary | ICD-10-CM | POA: Diagnosis not present

## 2016-03-16 DIAGNOSIS — I6522 Occlusion and stenosis of left carotid artery: Secondary | ICD-10-CM

## 2016-03-16 DIAGNOSIS — I6523 Occlusion and stenosis of bilateral carotid arteries: Secondary | ICD-10-CM | POA: Diagnosis present

## 2016-03-16 LAB — VAS US CAROTID
LCCADSYS: -127 cm/s
LCCAPDIAS: 2 cm/s
LEFT ECA DIAS: -11 cm/s
LEFT VERTEBRAL DIAS: 7 cm/s
LICADSYS: 93 cm/s
LICAPSYS: -326 cm/s
Left CCA dist dias: -5 cm/s
Left CCA prox sys: 70 cm/s
Left ICA dist dias: 13 cm/s
Left ICA prox dias: -27 cm/s
RCCAPSYS: 80 cm/s
RIGHT CCA MID DIAS: 20 cm/s
RIGHT ECA DIAS: -9 cm/s
RIGHT VERTEBRAL DIAS: 10 cm/s
Right CCA prox dias: 14 cm/s
Right cca dist sys: -150 cm/s

## 2016-03-16 NOTE — Progress Notes (Signed)
Chief Complaint: Follow up Extracranial Carotid Artery Stenosis   History of Present Illness  Douglas Meyer is a 75 y.o. male patient of Dr. Trula Slade is back for follow-up.  He is status post right carotid endarterectomy on 11/28/2014 for asymptomatic right carotid stenosis.  Intraoperative findings included a plaque that extended approximately 4 cm into the distal internal carotid artery.  A full 6 cm patch was used for patch angioplasty.  The patient continues to do well without neurologic symptoms.  He had a known left carotid stenosis.  Patient suffers chronic renal insufficiency.  He is status post right brachiocephalic fistula on 0000000. He is not yet on dialysis.  There is no evidence of steal or swelling in his arm  The patient denies any history of TIA or stroke symptoms, specifically the patient denies a history of amaurosis fugax or monocular blindness, denies a history unilateral  of facial drooping, denies a history of hemiplegia, and denies a history of receptive or expressive aphasia.   He denies any issues with dizziness; denies tingling, numbness, weakness, or cold feeling in either hand/arm.   Pt Diabetic: yes, A1C was 8.3 on 11/10/14 (review of records); he does not know more recent A1C but states him home finger stick glucose readings are less than 100   Pt smoker: former smoker, quit in 1975, smoked for 6 years  Pt meds include: Statin : yes ASA: yes Other anticoagulants/antiplatelets: no   Past Medical History:  Diagnosis Date  . Bilateral carotid artery disease ([place])   . CAD (coronary artery disease) 05/15/10   out of hospital myocardial infarction in the 1990's. this was picked up on an  EKG in 1997.  Cardiac catherterization demonstrated an occluded vessel and collateras.  I have no description of this.  A stress  perfusion study  done 2008 demonstrated an ejection fraction  of  53% with inferolateral  hypokinesis.  There was an inferolateral defect with  scar  and some mild mixed ischemia.   managed rx  . Cancer ([place])    skin  . CKD (chronic kidney disease), stage IV ([place])   . Diabetes mellitus    DM, type II  . ED (erectile dysfunction)   . Gout   . History of kidney stones   . Hx of cardiovascular stress test    a. Lexiscan myoview 10/18/12: EF 53%, inf and inf-lat scar, slight peri-infarct ischemia, small area of apical ischemia; no change from 2006  . Hyperlipidemia   . Hypertension   . Myocardial infarction   . Nephrolithiasis   . Polyp of colon   . PVD (peripheral vascular disease) ([place])     Social History Social History  Substance Use Topics  . Smoking status: Former Smoker    Years: 6.00    Quit date: 06/06/1973  . Smokeless tobacco: Never Used  . Alcohol use 3.6 oz/week    1 Glasses of wine, 5 Cans of beer per week    Family History Family History  Problem Relation Age of Onset  . Heart attack Father     deceased 74  . Diabetes type II Brother   . Diabetes type II Brother   . Diabetes Brother     rheumatic fever, peptic ulcer disease, deceased  . Diabetes Sister     deceased 3  . Heart attack Mother     deceased age 27s    Surgical History Past Surgical History:  Procedure Laterality Date  . AV FISTULA PLACEMENT Right 06/19/2015  Procedure: ARTERIOVENOUS (AV) FISTULA CREATION;  Surgeon: Serafina Mitchell, MD;  Location: [facility];  Service: Vascular;  Laterality: Right;  . COLONOSCOPY W/ POLYPECTOMY    . CYSTOSCOPY     stone removal x 1  . ENDARTERECTOMY Right 11/28/2014   Procedure: RIGHT CAROTID ENDARTERECTOMY;  Surgeon: Serafina Mitchell, MD;  Location: [facility];  Service: Vascular;  Laterality: Right;  . EYE SURGERY  06/2013   bilateral cataract surgery  . HUMERUS FRACTURE SURGERY     left  . MOHS SURGERY     multiple  . PATCH ANGIOPLASTY Right 11/28/2014   Procedure: PATCH ANGIOPLASTY RIGHT CAROTID;  Surgeon: Serafina Mitchell, MD;  Location: [facility];  Service: Vascular;  Laterality: Right;    No Known  Allergies  Current Outpatient Prescriptions  Medication Sig Dispense Refill  . ACCU-CHEK AVIVA PLUS test strip USE ONE STRIP ONCE DAILY 100 each 5  . allopurinol (ZYLOPRIM) 300 MG tablet TAKE 1 BY MOUTH DAILY 90 tablet 3  . amLODipine (NORVASC) 5 MG tablet Take 1 tablet (5 mg total) by mouth daily. 90 tablet 2  . Ascorbic Acid (VITAMIN C) 500 MG tablet Take 500 mg by mouth daily.      Marland Kitchen aspirin 81 MG tablet Take 81 mg by mouth daily.    Marland Kitchen atorvastatin (LIPITOR) 20 MG tablet TAKE ONE TABLET BY MOUTH ONCE DAILY AT  6PM 30 tablet 0  . atorvastatin (LIPITOR) 20 MG tablet TAKE ONE TABLET BY MOUTH ONCE DAILY AT  6PM 30 tablet 5  . BD PEN NEEDLE NANO U/F 32G X 4 MM MISC USE ONCE DAILY 100 each 0  . calcitRIOL (ROCALTROL) 0.5 MCG capsule Take 0.5 mcg by mouth every other day.     . furosemide (LASIX) 80 MG tablet Take 1 tablet (80 mg total) by mouth daily. (Patient taking differently: Take 160 mg by mouth daily. ) 90 tablet 3  . glipiZIDE (GLUCOTROL) 10 MG tablet TAKE ONE TABLET BY MOUTH ONCE DAILY 90 tablet 0  . Insulin Glargine (LANTUS SOLOSTAR) 100 UNIT/ML Solostar Pen 41UNITS SUBCUTANEOUSLY AT BEDTIME 45 pen 1  . Lancets (ACCU-CHEK MULTICLIX) lancets 1 each by Other route daily. Dx 250.01 102 each 3  . metoprolol (TOPROL-XL) 200 MG 24 hr tablet TAKE ONE-HALF TABLET BY MOUTH EVERY DAY 180 tablet 0  . Multiple Vitamins-Minerals (MULTIVITAMIN WITH MINERALS) tablet Take 1 tablet by mouth daily.      . nitroGLYCERIN (NITROSTAT) 0.4 MG SL tablet Place 0.4 mg under the tongue every 5 (five) minutes as needed for chest pain.    . Omega-3 Fatty Acids (FISH OIL) 500 MG CAPS Take 1 capsule by mouth daily.    . trandolapril (MAVIK) 4 MG tablet Take 1 tablet (4 mg total) by mouth 2 (two) times daily. Please schedule appointment for refills. 60 tablet 0  . oxyCODONE-acetaminophen (PERCOCET/ROXICET) 5-325 MG tablet Take 1 tablet by mouth every 6 (six) hours as needed. (Patient not taking: Reported on 03/16/2016)  20 tablet 0   No current facility-administered medications for this visit.     Review of Systems : See HPI for pertinent positives and negatives.  Physical Examination  Vitals:   03/16/16 1606  BP: 136/67  Pulse: 69  Resp: 14  Temp: 98 F (36.7 C)  SpO2: 99%  Weight: 164 lb (74.4 kg)  Height: 5\' 6"  (1.676 m)   Body mass index is 26.47 kg/m.  General: WDWN male in NAD GAIT: normal Eyes: PERRLA Pulmonary:  Respirations are non-labored,  good air movement in all fields, CTAB.  Cardiac: regular rhythm, no detected murmur.  VASCULAR EXAM Carotid Bruits Right Left   Positive Positive    Aorta is not palpable. Radial pulses are 2+ palpable and  equal.                                                                                                                            LE Pulses Right Left       POPLITEAL  not palpable   not palpable       POSTERIOR TIBIAL  faintly palpable   faintly palpable        DORSALIS PEDIS      ANTERIOR TIBIAL faintly palpable  faintly palpable     Gastrointestinal: soft, nontender, BS WNL, no r/g, no palpable masses.  Musculoskeletal: no muscle atrophy/wasting. M/S 5/5 throughout, extremities without ischemic changes.  Neurologic: A&O X 3; Appropriate Affect, Speech is normal CN 2-12 intact, pain and light touch intact in extremities, Motor exam as listed above.   Assessment: Douglas Meyer is a 75 y.o. male who is status post right carotid endarterectomy on 11/28/2014 for asymptomatic right carotid stenosis. He has no recent or distant history of stroke or TIA. His left ICA has apparently occluded since the last carotid duplex, with no symptoms of stroke or TIA. See DATA below.   His atherosclerotic risk factors include DM that seems to be in control lately, was uncontrolled in June of 2016; short history of smoking (6 years, quit in 1975), and stage 4 CKD. He has an AV fistula ready for use, but has not needed hemodialysis yet. He  takes a daily statin and ASA.  DATA No significant stenosis of the right external or bilateral common carotid arteries.  Greater than 50% left external carotid artery stenosis. Dampened antegrade flow in the left vertebral artery.  Very difficult exam on left ICA due to shadowing of the plaque, no flow in the proximal and distal left ICA and little flow in the mid section.  Right CEA site with no restenosis. No flow in the left proximal and distal ICA; this is a significant change compared to 10/20/15 duplex which indicated 60-79% left ICA stenosis. Significant improvement of the right ICA noted when compared to the previous exam on 10/20/15, prior to the right CEA.  Plan: Follow-up in 6 months with Carotid Duplex scan.   I discussed in depth with the patient the nature of atherosclerosis, and emphasized the importance of maximal medical management including strict control of blood pressure, blood glucose, and lipid levels, obtaining regular exercise, and continued cessation of smoking.  The patient is aware that without maximal medical management the underlying atherosclerotic disease process will progress, limiting the benefit of any interventions. The patient was given information about stroke prevention and what symptoms should prompt the patient to seek immediate medical care. Thank you for allowing [place] to participate in this patient's care.  Clemon Chambers, RN, MSN,  FNP-C [facility] Office: [facility] Physician: Scot Dock  03/16/16 4:14 PM

## 2016-03-16 NOTE — Patient Instructions (Signed)
Stroke Prevention Some medical conditions and behaviors are associated with an increased chance of having a stroke. You may prevent a stroke by making healthy choices and managing medical conditions. HOW CAN I REDUCE MY RISK OF HAVING A STROKE?   Stay physically active. Get at least 30 minutes of activity on most or all days.  Do not smoke. It may also be helpful to avoid exposure to secondhand smoke.  Limit alcohol use. Moderate alcohol use is considered to be:  No more than 2 drinks per day for men.  No more than 1 drink per day for nonpregnant women.  Eat healthy foods. This involves:  Eating 5 or more servings of fruits and vegetables a day.  Making dietary changes that address high blood pressure (hypertension), high cholesterol, diabetes, or obesity.  Manage your cholesterol levels.  Making food choices that are high in fiber and low in saturated fat, trans fat, and cholesterol may control cholesterol levels.  Take any prescribed medicines to control cholesterol as directed by your health care provider.  Manage your diabetes.  Controlling your carbohydrate and sugar intake is recommended to manage diabetes.  Take any prescribed medicines to control diabetes as directed by your health care provider.  Control your hypertension.  Making food choices that are low in salt (sodium), saturated fat, trans fat, and cholesterol is recommended to manage hypertension.  Ask your health care provider if you need treatment to lower your blood pressure. Take any prescribed medicines to control hypertension as directed by your health care provider.  If you are 18-39 years of age, have your blood pressure checked every 3-5 years. If you are 40 years of age or older, have your blood pressure checked every year.  Maintain a healthy weight.  Reducing calorie intake and making food choices that are low in sodium, saturated fat, trans fat, and cholesterol are recommended to manage  weight.  Stop drug abuse.  Avoid taking birth control pills.  Talk to your health care provider about the risks of taking birth control pills if you are over 35 years old, smoke, get migraines, or have ever had a blood clot.  Get evaluated for sleep disorders (sleep apnea).  Talk to your health care provider about getting a sleep evaluation if you snore a lot or have excessive sleepiness.  Take medicines only as directed by your health care provider.  For some people, aspirin or blood thinners (anticoagulants) are helpful in reducing the risk of forming abnormal blood clots that can lead to stroke. If you have the irregular heart rhythm of atrial fibrillation, you should be on a blood thinner unless there is a good reason you cannot take them.  Understand all your medicine instructions.  Make sure that other conditions (such as anemia or atherosclerosis) are addressed. SEEK IMMEDIATE MEDICAL CARE IF:   You have sudden weakness or numbness of the face, arm, or leg, especially on one side of the body.  Your face or eyelid droops to one side.  You have sudden confusion.  You have trouble speaking (aphasia) or understanding.  You have sudden trouble seeing in one or both eyes.  You have sudden trouble walking.  You have dizziness.  You have a loss of balance or coordination.  You have a sudden, severe headache with no known cause.  You have new chest pain or an irregular heartbeat. Any of these symptoms may represent a serious problem that is an emergency. Do not wait to see if the symptoms will   go away. Get medical help at once. Call your local emergency services (911 in [place] Do not drive yourself to the hospital.   This information is not intended to replace advice given to you by your health care provider. Make sure you discuss any questions you have with your health care provider.   Document Released: 06/30/2004 Document Revised: 06/13/2014 Document Reviewed:  11/23/2012 Elsevier Interactive Patient Education 2016 Elsevier Inc.  

## 2016-03-18 NOTE — Addendum Note (Signed)
Addended by: Kaleen Mask on: 03/18/2016 03:28 PM   Modules accepted: Orders

## 2016-03-25 ENCOUNTER — Other Ambulatory Visit: Payer: Self-pay | Admitting: Cardiology

## 2016-03-25 NOTE — Telephone Encounter (Signed)
Rx(s) sent to pharmacy electronically.  

## 2016-04-04 ENCOUNTER — Other Ambulatory Visit: Payer: Self-pay

## 2016-04-04 ENCOUNTER — Encounter (HOSPITAL_COMMUNITY): Payer: Medicare Other

## 2016-04-04 ENCOUNTER — Ambulatory Visit: Payer: Medicare Other | Admitting: Family

## 2016-04-04 MED ORDER — TRANDOLAPRIL 4 MG PO TABS: 4.0000 mg | ORAL_TABLET | Freq: Two times a day (BID) | ORAL | 0 refills | Status: DC

## 2016-04-04 NOTE — Telephone Encounter (Signed)
Rx(s) sent to pharmacy electronically.  

## 2016-04-16 ENCOUNTER — Other Ambulatory Visit: Payer: Self-pay | Admitting: Family Medicine

## 2016-04-18 ENCOUNTER — Other Ambulatory Visit: Payer: Self-pay | Admitting: Cardiovascular Disease

## 2016-04-18 ENCOUNTER — Other Ambulatory Visit: Payer: Self-pay | Admitting: Family Medicine

## 2016-04-18 NOTE — Telephone Encounter (Signed)
REFILL 

## 2016-04-19 ENCOUNTER — Encounter: Payer: Self-pay | Admitting: Family Medicine

## 2016-04-19 ENCOUNTER — Ambulatory Visit (INDEPENDENT_AMBULATORY_CARE_PROVIDER_SITE_OTHER): Payer: Medicare Other | Admitting: Family Medicine

## 2016-04-19 VITALS — BP 158/70 | HR 62 | Temp 97.7°F | Ht 65.25 in | Wt 164.2 lb

## 2016-04-19 DIAGNOSIS — R0989 Other specified symptoms and signs involving the circulatory and respiratory systems: Secondary | ICD-10-CM

## 2016-04-19 DIAGNOSIS — Z0000 Encounter for general adult medical examination without abnormal findings: Secondary | ICD-10-CM | POA: Diagnosis not present

## 2016-04-19 DIAGNOSIS — E108 Type 1 diabetes mellitus with unspecified complications: Secondary | ICD-10-CM

## 2016-04-19 DIAGNOSIS — E785 Hyperlipidemia, unspecified: Secondary | ICD-10-CM

## 2016-04-19 DIAGNOSIS — M1A0790 Idiopathic chronic gout, unspecified ankle and foot, without tophus (tophi): Secondary | ICD-10-CM

## 2016-04-19 DIAGNOSIS — N259 Disorder resulting from impaired renal tubular function, unspecified: Secondary | ICD-10-CM

## 2016-04-19 DIAGNOSIS — I10 Essential (primary) hypertension: Secondary | ICD-10-CM

## 2016-04-19 DIAGNOSIS — I739 Peripheral vascular disease, unspecified: Secondary | ICD-10-CM | POA: Diagnosis not present

## 2016-04-19 LAB — POCT URINALYSIS DIPSTICK
BILIRUBIN UA: NEGATIVE
GLUCOSE UA: NEGATIVE
Ketones, UA: NEGATIVE
Nitrite, UA: NEGATIVE
RBC UA: NEGATIVE
Urobilinogen, UA: 0.2
pH, UA: 5.5

## 2016-04-19 LAB — CBC WITH DIFFERENTIAL/PLATELET
BASOS PCT: 0.5 % (ref 0.0–3.0)
Basophils Absolute: 0 10*3/uL (ref 0.0–0.1)
EOS ABS: 0.6 10*3/uL (ref 0.0–0.7)
Eosinophils Relative: 6.9 % — ABNORMAL HIGH (ref 0.0–5.0)
HEMATOCRIT: 37.8 % — AB (ref 39.0–52.0)
Hemoglobin: 12.3 g/dL — ABNORMAL LOW (ref 13.0–17.0)
LYMPHS PCT: 19.6 % (ref 12.0–46.0)
Lymphs Abs: 1.6 10*3/uL (ref 0.7–4.0)
MCHC: 32.6 g/dL (ref 30.0–36.0)
MCV: 92.9 fl (ref 78.0–100.0)
Monocytes Absolute: 0.6 10*3/uL (ref 0.1–1.0)
Monocytes Relative: 7.5 % (ref 3.0–12.0)
NEUTROS ABS: 5.5 10*3/uL (ref 1.4–7.7)
Neutrophils Relative %: 65.5 % (ref 43.0–77.0)
PLATELETS: 221 10*3/uL (ref 150.0–400.0)
RBC: 4.07 Mil/uL — ABNORMAL LOW (ref 4.22–5.81)
RDW: 15.1 % (ref 11.5–15.5)
WBC: 8.4 10*3/uL (ref 4.0–10.5)

## 2016-04-19 LAB — LIPID PANEL
Cholesterol: 131 mg/dL (ref 0–200)
HDL: 34.2 mg/dL — ABNORMAL LOW (ref >39.00)
NonHDL: 96.35
Total CHOL/HDL Ratio: 4
Triglycerides: 223 mg/dL — ABNORMAL HIGH (ref 0.0–149.0)
VLDL: 44.6 mg/dL — ABNORMAL HIGH (ref 0.0–40.0)

## 2016-04-19 LAB — BASIC METABOLIC PANEL
BUN: 75 mg/dL — ABNORMAL HIGH (ref 6–23)
CHLORIDE: 101 meq/L (ref 96–112)
CO2: 31 meq/L (ref 19–32)
Calcium: 9.6 mg/dL (ref 8.4–10.5)
Creatinine, Ser: 4.1 mg/dL — ABNORMAL HIGH (ref 0.40–1.50)
GFR: 15.15 mL/min — ABNORMAL LOW (ref >60.00)
Glucose, Bld: 86 mg/dL (ref 70–99)
Potassium: 4 mEq/L (ref 3.5–5.1)
SODIUM: 142 meq/L (ref 135–145)

## 2016-04-19 LAB — LDL CHOLESTEROL, DIRECT: Direct LDL: 55 mg/dL

## 2016-04-19 LAB — MICROALBUMIN / CREATININE URINE RATIO
Creatinine,U: 47.9 mg/dL
MICROALB/CREAT RATIO: 19.6 mg/g (ref 0.0–30.0)
Microalb, Ur: 9.4 mg/dL — ABNORMAL HIGH (ref 0.0–1.9)

## 2016-04-19 LAB — HEPATIC FUNCTION PANEL
ALK PHOS: 81 U/L (ref 39–117)
ALT: 12 U/L (ref 0–53)
AST: 10 U/L (ref 0–37)
Albumin: 4.3 g/dL (ref 3.5–5.2)
BILIRUBIN DIRECT: 0.1 mg/dL (ref 0.0–0.3)
BILIRUBIN TOTAL: 0.8 mg/dL (ref 0.2–1.2)
Total Protein: 6.7 g/dL (ref 6.0–8.3)

## 2016-04-19 LAB — HEMOGLOBIN A1C: Hgb A1c MFr Bld: 7 % — ABNORMAL HIGH (ref 4.6–6.5)

## 2016-04-19 LAB — TSH: TSH: 2.12 u[IU]/mL (ref 0.35–4.50)

## 2016-04-19 MED ORDER — ATORVASTATIN CALCIUM 20 MG PO TABS: ORAL_TABLET | ORAL | 3 refills | Status: DC

## 2016-04-19 MED ORDER — INSULIN GLARGINE 100 UNIT/ML SOLOSTAR PEN: PEN_INJECTOR | SUBCUTANEOUS | 6 refills | Status: DC

## 2016-04-19 MED ORDER — AMLODIPINE BESYLATE 5 MG PO TABS: 5.0000 mg | ORAL_TABLET | Freq: Every day | ORAL | 3 refills | Status: DC

## 2016-04-19 MED ORDER — GLIPIZIDE 10 MG PO TABS: 10.0000 mg | ORAL_TABLET | Freq: Every day | ORAL | 3 refills | Status: DC

## 2016-04-19 MED ORDER — ALLOPURINOL 300 MG PO TABS: ORAL_TABLET | ORAL | 3 refills | Status: DC

## 2016-04-19 NOTE — Progress Notes (Signed)
Douglas Meyer is a 75 -year-old married male nonsmoker who comes in today for annual physical examination because a history of hypertension, hyperlipidemia, diabetes, coronary artery disease, renal failure class IV  He takes allopurinol 3 mg daily to prevent gout  He takes Norvasc 5 mg along with Lasix 80 mg, Toprol 100 mg and Mavik 4 mg twice daily for hypertension. His blood pressure runs [id number] systolic diastolics in the [id number]. He saw his nephrologist recently you noted this blood pressure readings but did not change his medication.  He takes Lipitor and an aspirin tablet daily because of history of hyperlipidemia and coronary artery disease  He takes Glucotrol 10 mg daily along with 38 units of Lantus daily because of diabetes  He's compliant with all his medication. He says overall he feels well. He had a shunt put in his right brachial artery January 2017 in anticipation of dialysis. This is mainly just by his nephrologist Dr. Nelda Severe.  He gets routine eye care, dental care, last colonoscopy 2008 normal. No family history of colon cancer polyps etc. Therefore I don't think in his risk stratification category he needs any further colonoscopies.  He walks 30 minutes a day with some friends and he continues to work at [employer].  Vaccinations tetanus 2012. Seasonal flu shot he's already had  EKG done by his cardiologist Dr. Deeann Dowse.  Cognitive function normal he walks daily home health safety reviewed no issues identified, no guns in the house, he does have a healthcare power of attorney and living well  He checks his blood sugar daily runs fasting between 90 and 100.  14 point review of systems reviewed and negative except for above  Physical evaluation.vs BP (!) 158/70 (BP Location: Left Arm, Patient Position: Sitting, Cuff Size: Normal)   Pulse 62   Temp 97.7 F (36.5 C) (Oral)   Ht 5' 5.25" (1.657 m)   Wt 164 lb 3.2 oz (74.5 kg)   BMI 27.12 kg/m  Examination of the HEENT were negative  neck was supple no adenopathy thyroid normal scar right neck from previous carotid endarterectomy. Soft bruit. Pulmonary exam normal cardiac exam normal except for extremely distant heart sounds. Abdominal exam normal genitalia normal circumcised male rectum normal stool guaiac negative extremities normal skin normal peripheral pulses barely palpable. He has severe fungal infection in all 10 toenails. I prepped his toenails with alcohol and trimmed. Advised to soak in file weekly. This is a huge risk factor for infection.  Impression #1 hypertension at goal..... Continue current therapy  #2 hyperlipidemia........ continue aspirin and Lipitor check labs  #3 diabetes type 1..... Check labs  #4. Renal failure class IV....... followed by Dr. Donley Redder  #5 coronary artery disease.... Asymptomatic..... Followed by Dr. Deeann Dowse  #6 status post right carotid endarterectomy  #7 fungal infection and all 10 toenails.......Marland Kitchen risk factor for infection because of his decreased pulses in diabetes.....Marland Kitchen advised to soak in file weekly. Toenails trimmed today by me.

## 2016-04-19 NOTE — Patient Instructions (Signed)
Continue current medications  Continue diet and daily exercise  Soak your feet weekly starting tonight in warm water with vinegar for about 10 minutes........ then begin to file all 10 toenails. Do this weekly until the nails are completely gone  Return in one year for general physical exam sooner if any problems  When you call next July,,,,,,,,, I would set up a physical exam with Beaulah Dinning our new adult nurse practitioner,,,,,, he can also provide you long-term care

## 2016-04-19 NOTE — Progress Notes (Signed)
Pre visit review using our clinic review tool, if applicable. No additional management support is needed unless otherwise documented below in the visit note. 

## 2016-07-12 ENCOUNTER — Encounter: Payer: Self-pay | Admitting: Cardiology

## 2016-07-18 ENCOUNTER — Other Ambulatory Visit: Payer: Self-pay | Admitting: Family Medicine

## 2016-07-18 ENCOUNTER — Other Ambulatory Visit: Payer: Self-pay | Admitting: Emergency Medicine

## 2016-07-18 MED ORDER — GLUCOSE BLOOD VI STRP: ORAL_STRIP | 5 refills | Status: DC

## 2016-07-18 NOTE — Telephone Encounter (Signed)
[facility] need a new Rx for AccuChek test strips with the diagnoses code on it due to the pt having Part B insurance.

## 2016-07-19 ENCOUNTER — Other Ambulatory Visit: Payer: Self-pay | Admitting: Surgery

## 2016-07-19 DIAGNOSIS — T82868D Thrombosis of vascular prosthetic devices, implants and grafts, subsequent encounter: Secondary | ICD-10-CM

## 2016-07-19 DIAGNOSIS — I25810 Atherosclerosis of coronary artery bypass graft(s) without angina pectoris: Secondary | ICD-10-CM

## 2016-07-20 ENCOUNTER — Other Ambulatory Visit: Payer: Self-pay | Admitting: Emergency Medicine

## 2016-07-20 MED ORDER — GLUCOSE BLOOD VI STRP: ORAL_STRIP | 5 refills | Status: DC

## 2016-07-26 ENCOUNTER — Encounter: Payer: Self-pay | Admitting: Family

## 2016-07-29 ENCOUNTER — Ambulatory Visit (HOSPITAL_COMMUNITY)
Admission: RE | Admit: 2016-07-29 | Discharge: 2016-07-29 | Disposition: A | Discharge status: Home / Self Care | Payer: Medicare Other | Source: Ambulatory Visit | Attending: Vascular Surgery | Admitting: Vascular Surgery

## 2016-07-29 DIAGNOSIS — T82868D Thrombosis of vascular prosthetic devices, implants and grafts, subsequent encounter: Secondary | ICD-10-CM | POA: Diagnosis not present

## 2016-08-01 ENCOUNTER — Encounter: Payer: Self-pay | Admitting: Surgery

## 2016-08-01 ENCOUNTER — Ambulatory Visit (INDEPENDENT_AMBULATORY_CARE_PROVIDER_SITE_OTHER): Payer: Self-pay | Admitting: Surgery

## 2016-08-01 ENCOUNTER — Other Ambulatory Visit: Payer: Self-pay

## 2016-08-01 VITALS — BP 152/75 | HR 59 | Temp 97.7°F | Resp 16 | Ht 65.2 in | Wt 161.0 lb

## 2016-08-01 DIAGNOSIS — N184 Chronic kidney disease, stage 4 (severe): Secondary | ICD-10-CM

## 2016-08-01 NOTE — Progress Notes (Signed)
 Patient name: Douglas Meyer MRN: [id number] DOB: 11/16/1940 Sex: male  REASON FOR VISIT:     Follow-up  HISTORY OF PRESENT ILLNESS:   Douglas Meyer is a 76 y.o. male returns today for evaluation of his right brachiocephalic fistula which was created on 06/19/2015.  He underwent a fistula duplex on 227 which showed an excellent caliber vein, with all measurements greater than 0.5 cm.  There were 2 branches and a valve leaflet with possible thrombus.  Unfortunately his fistula has occluded.  He is not yet on dialysis  CURRENT MEDICATIONS:    Current Outpatient Prescriptions  Medication Sig Dispense Refill  . ACCU-CHEK AVIVA PLUS test strip USE ONE STRIP ONCE DAILY 100 each 3  . allopurinol (ZYLOPRIM) 300 MG tablet TAKE 1 BY MOUTH DAILY 90 tablet 3  . amLODipine (NORVASC) 5 MG tablet Take 1 tablet (5 mg total) by mouth daily. 90 tablet 3  . Ascorbic Acid (VITAMIN C) 500 MG tablet Take 500 mg by mouth daily.      . aspirin 81 MG tablet Take 81 mg by mouth daily.    . atorvastatin (LIPITOR) 20 MG tablet TAKE ONE TABLET BY MOUTH ONCE DAILY AT  6:00  PM 90 tablet 3  . BD PEN NEEDLE NANO U/F 32G X 4 MM MISC USE AS DIRECTED ONCE DAILY 100 each 1  . calcitRIOL (ROCALTROL) 0.5 MCG capsule Take 0.5 mcg by mouth every other day.     . furosemide (LASIX) 80 MG tablet Take 1 tablet (80 mg total) by mouth daily. (Patient taking differently: Take 160 mg by mouth daily. ) 90 tablet 3  . glipiZIDE (GLUCOTROL) 10 MG tablet Take 1 tablet (10 mg total) by mouth daily. 90 tablet 3  . glucose blood (ACCU-CHEK AVIVA PLUS) test strip USE ONE STRIP ONCE DAILY 100 each 5  . hydrocortisone 2.5 % cream   0  . Insulin Glargine (LANTUS SOLOSTAR) 100 UNIT/ML Solostar Pen 41UNITS SUBCUTANEOUSLY AT BEDTIME 45 pen 6  . Lancets (ACCU-CHEK MULTICLIX) lancets 1 each by Other route daily. Dx 250.01 102 each 3  . metoprolol (TOPROL-XL) 200 MG 24 hr tablet TAKE ONE-HALF TABLET BY MOUTH EVERY  DAY 180 tablet 0  . Multiple Vitamins-Minerals (MULTIVITAMIN WITH MINERALS) tablet Take 1 tablet by mouth daily.      . nitroGLYCERIN (NITROSTAT) 0.4 MG SL tablet Place 0.4 mg under the tongue every 5 (five) minutes as needed for chest pain.    . Omega-3 Fatty Acids (FISH OIL) 500 MG CAPS Take 1 capsule by mouth daily.    . trandolapril (MAVIK) 4 MG tablet Take 1 tablet (4 mg total) by mouth 2 (two) times daily. NEEDS APPOINTMENT FOR FUTURE REFILLS 180 tablet 0   No current facility-administered medications for this visit.     REVIEW OF SYSTEMS:   [X] denotes positive finding, [ ] denotes negative finding Cardiac  Comments:  Chest pain or chest pressure:    Shortness of breath upon exertion:    Short of breath when lying flat:    Irregular heart rhythm:    Constitutional    Fever or chills:      PHYSICAL EXAM:   Vitals:   08/01/16 0912  BP: (!) 152/75  Pulse: (!) 59  Resp: 16  Temp: 97.7 F (36.5 C)  TempSrc: Oral  SpO2: 100%  Weight: 161 lb (73 kg)  Height: 5' 5.2" (1.656 m)    GENERAL: The patient is a well-nourished male, in no acute distress. The vital   signs are documented above. CARDIOVASCULAR: There is a regular rate and rhythm. PULMONARY: Non-labored respirations No thrill within fistula  STUDIES:      MEDICAL ISSUES:   Despite a duplex which showed a widely patent fistula, 5 days ago, this is now occluded.  I think the best course of action is to proceed with a left basilic vein transposition.  We will do this in stages.  The first stage is been scheduled for Thursday, March 8  Wells Brendi Mccarroll, MD [facility] Tel [phone] Pager [phone]    

## 2016-08-10 ENCOUNTER — Encounter (HOSPITAL_COMMUNITY): Payer: Self-pay | Admitting: *Deleted

## 2016-08-10 NOTE — Anesthesia Preprocedure Evaluation (Addendum)
Anesthesia Evaluation  Patient identified by MRN, date of birth, ID band Patient awake    Reviewed: Allergy & Precautions, H&P , NPO status , Patient's Chart, lab work & pertinent test results  Airway Mallampati: III  TM Distance: >3 FB Neck ROM: Full    Dental no notable dental hx. (+) Teeth Intact, Dental Advisory Given   Pulmonary neg pulmonary ROS, former smoker,    Pulmonary exam normal breath sounds clear to auscultation       Cardiovascular Exercise Tolerance: Good hypertension, Pt. on medications + CAD, + Past MI and + Peripheral Vascular Disease   Rhythm:Regular Rate:Normal     Neuro/Psych negative neurological ROS  negative psych ROS   GI/Hepatic negative GI ROS, Neg liver ROS,   Endo/Other  diabetes, Insulin Dependent  Renal/GU Renal InsufficiencyRenal disease  negative genitourinary   Musculoskeletal   Abdominal   Peds  Hematology negative hematology ROS (+)   Anesthesia Other Findings   Reproductive/Obstetrics negative OB ROS                            Anesthesia Physical Anesthesia Plan  ASA: III  Anesthesia Plan: MAC   Post-op Pain Management:    Induction: Intravenous  Airway Management Planned: Simple Face Mask  Additional Equipment:   Intra-op Plan:   Post-operative Plan:   Informed Consent: I have reviewed the patients History and Physical, chart, labs and discussed the procedure including the risks, benefits and alternatives for the proposed anesthesia with the patient or authorized representative who has indicated his/her understanding and acceptance.   Dental advisory given  Plan Discussed with: CRNA  Anesthesia Plan Comments:         Anesthesia Quick Evaluation

## 2016-08-10 NOTE — Progress Notes (Signed)
Anesthesia Chart Review: SAME DAY WORK-UP.  Patient is a 76 year old male scheduled for left basilic vein transposition, first stage on 08/11/16 by Dr. Trula Slade. He is not yet on hemodialysis.  History includes former smoker, CAD/MI '90's (reported with occluded vessel with collateral flow but Dr. 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   - PCP is Dr. Stevie Kern, last visit 04/19/16.  - Primary cardiologist is Dr. Percival Spanish, last visit 02/27/15. One year follow-up was planned. He denied chest pain and SOB per his PAT phone interviewing RN. - Nephrologist is Dr. Jimmy Footman.  Meds include allopurinol, amlodipine, ASA 81 mg, Lipitor, Rocaltrol, Lasix, glipizide, Lantus, Toprol XL, Nitro, fish oil, trandolapril.  EKG 04/19/16: SR. Small Q waves in inferolateral leads which is unchanged from prior tracing on 12/17/14.  Echo 09/26/14: - Left ventricle: The cavity size was normal. There was mild focal basal hypertrophy of the septum. Systolic function was normal. The estimated ejection fraction was in the range of 55% to 60%. Wall motion was normal; there were no regional wall motion abnormalities. Doppler parameters are consistent with abnormal left ventricular relaxation (grade 1 diastolic dysfunction). There was no evidence of elevated ventricular filling pressure by Doppler parameters. - Aortic valve: Trileaflet; normal thickness leaflets. There was no regurgitation. - Mitral valve: Structurally normal valve. There was no regurgitation. - Left atrium: The atrium was moderately dilated. - Right ventricle: Systolic function was normal. - Right atrium: The atrium was normal in size. - Tricuspid valve: There was mild regurgitation. - Pulmonic  valve: There was no regurgitation. - Pulmonary arteries: Systolic pressure was within the normal range. - Inferior vena cava: The vessel was normal in size. - Pericardium, extracardiac: There was no pericardial effusion.  Nuclear stress test 10/18/12: Overall Impression: There is moderate scar affecting the base/mid inferior wall and the base/mid inferolateral wall. There may be very slight peri-infarct ischemia in this area. There is also a very small area of ischemia at the apical cap. This was also seen in the past. LV Ejection Fraction: 53%. LV Wall Motion: There is mild inferobasilar hypokinesis. This study is unchanged from the prior study of 2006.   Carotid duplex 03/16/16: Impression: 1. Patent right carotid endarterectomy site with no right ICA stenosis. 2. Doppler velocities suggest no flow in the left proximal and distal ICA.  Significant improvement of the right ICA noted when compared to previous exam prior to surgery on 10/20/2014. Prior exam for comparison on 09/21/2015.  He will need labs on arrival. Last A1c was 7.0 on 04/19/16.  Patient with known CAD. Last cardiology visit within the past two years. He denied CV symptoms. Recent EKG stable. Discussed with anesthesiologist Dr. Linna Caprice. If remains asymptomatic and labs acceptable then it is anticipated that he can proceed as planned with dialysis access procedure.   [facility] Short Stay Center/Anesthesiology Phone [phone] 08/10/2016 2:12 PM

## 2016-08-10 NOTE — Progress Notes (Signed)
Pt denies SOB and chest pain. Pt is under the care of Dr. Percival Spanish, Cardiology. Pt stated that a cardiac cath was performed > 20 years ago. Pt stated that he was advised to take half dose of Lantus insulin tonight by surgeon's staff. Pt made aware of diabetes protocol to not take Glipizide tonight, check BG every 2 hours prior to arrival, drink 4 oz. of Apple juice for BG <70,  recheck BG 15 minutes after drinking juice, if BG remains < 70 after intervention to call SS. Pt made aware to stop taking otc vitamins, fish oil and herbal medications. Do not take any NSAIDs ie: Ibuprofen, Advil, Naproxen, BC and Goody Powder. Pt verbalized understanding of all pre-op instructions. Anesthesia asked to review pt cardiac history.

## 2016-08-11 ENCOUNTER — Ambulatory Visit (HOSPITAL_COMMUNITY): Payer: Medicare Other | Admitting: Vascular Surgery

## 2016-08-11 ENCOUNTER — Encounter (HOSPITAL_COMMUNITY)
Admission: RE | Disposition: A | Discharge status: Home / Self Care | Payer: Self-pay | Source: Ambulatory Visit | Attending: Surgery

## 2016-08-11 ENCOUNTER — Encounter (HOSPITAL_COMMUNITY): Payer: Self-pay

## 2016-08-11 ENCOUNTER — Ambulatory Visit (HOSPITAL_COMMUNITY)
Admission: RE | Admit: 2016-08-11 | Discharge: 2016-08-11 | Disposition: A | Discharge status: Home / Self Care | Payer: Medicare Other | Source: Ambulatory Visit | Attending: Surgery | Admitting: Surgery

## 2016-08-11 DIAGNOSIS — Z794 Long term (current) use of insulin: Secondary | ICD-10-CM | POA: Diagnosis not present

## 2016-08-11 DIAGNOSIS — E1151 Type 2 diabetes mellitus with diabetic peripheral angiopathy without gangrene: Secondary | ICD-10-CM | POA: Insufficient documentation

## 2016-08-11 DIAGNOSIS — Y832 Surgical operation with anastomosis, bypass or graft as the cause of abnormal reaction of the patient, or of later complication, without mention of misadventure at the time of the procedure: Secondary | ICD-10-CM | POA: Insufficient documentation

## 2016-08-11 DIAGNOSIS — I252 Old myocardial infarction: Secondary | ICD-10-CM | POA: Diagnosis not present

## 2016-08-11 DIAGNOSIS — I2510 Atherosclerotic heart disease of native coronary artery without angina pectoris: Secondary | ICD-10-CM | POA: Insufficient documentation

## 2016-08-11 DIAGNOSIS — Z87891 Personal history of nicotine dependence: Secondary | ICD-10-CM | POA: Diagnosis not present

## 2016-08-11 DIAGNOSIS — E1122 Type 2 diabetes mellitus with diabetic chronic kidney disease: Secondary | ICD-10-CM | POA: Diagnosis not present

## 2016-08-11 DIAGNOSIS — R600 Localized edema: Secondary | ICD-10-CM

## 2016-08-11 DIAGNOSIS — Z7982 Long term (current) use of aspirin: Secondary | ICD-10-CM | POA: Diagnosis not present

## 2016-08-11 DIAGNOSIS — Z79899 Other long term (current) drug therapy: Secondary | ICD-10-CM | POA: Insufficient documentation

## 2016-08-11 DIAGNOSIS — T82868A Thrombosis of vascular prosthetic devices, implants and grafts, initial encounter: Secondary | ICD-10-CM | POA: Insufficient documentation

## 2016-08-11 DIAGNOSIS — N184 Chronic kidney disease, stage 4 (severe): Secondary | ICD-10-CM | POA: Insufficient documentation

## 2016-08-11 DIAGNOSIS — I129 Hypertensive chronic kidney disease with stage 1 through stage 4 chronic kidney disease, or unspecified chronic kidney disease: Secondary | ICD-10-CM | POA: Diagnosis not present

## 2016-08-11 DIAGNOSIS — N185 Chronic kidney disease, stage 5: Secondary | ICD-10-CM | POA: Diagnosis not present

## 2016-08-11 DIAGNOSIS — E108 Type 1 diabetes mellitus with unspecified complications: Secondary | ICD-10-CM

## 2016-08-11 HISTORY — PX: BASCILIC VEIN TRANSPOSITION: SHX5742

## 2016-08-11 LAB — GLUCOSE, CAPILLARY
GLUCOSE-CAPILLARY: 60 mg/dL — AB (ref 65–99)
GLUCOSE-CAPILLARY: 94 mg/dL (ref 65–99)

## 2016-08-11 LAB — POCT I-STAT 4, (NA,K, GLUC, HGB,HCT)
Glucose, Bld: 56 mg/dL — ABNORMAL LOW (ref 65–99)
HCT: 33 % — ABNORMAL LOW (ref 39.0–52.0)
Hemoglobin: 11.2 g/dL — ABNORMAL LOW (ref 13.0–17.0)
Potassium: 3.7 mmol/L (ref 3.5–5.1)
SODIUM: 142 mmol/L (ref 135–145)

## 2016-08-11 SURGERY — TRANSPOSITION, VEIN, BASILIC
Anesthesia: Monitor Anesthesia Care | Site: Arm Upper | Laterality: Left

## 2016-08-11 MED ORDER — DEXTROSE 50 % IV SOLN
INTRAVENOUS | Status: AC
  Filled 2016-08-11: qty 50

## 2016-08-11 MED ORDER — PROTAMINE SULFATE 10 MG/ML IV SOLN
INTRAVENOUS | Status: DC | PRN
  Administered 2016-08-11: 25 mg via INTRAVENOUS

## 2016-08-11 MED ORDER — GLYCOPYRROLATE 0.2 MG/ML IJ SOLN
INTRAMUSCULAR | Status: DC | PRN
Start: 2016-08-11 — End: 2016-08-11
  Administered 2016-08-11: 0.2 mg via INTRAVENOUS

## 2016-08-11 MED ORDER — SODIUM CHLORIDE 0.9 % IV SOLN
INTRAVENOUS | Status: DC | PRN
  Administered 2016-08-11: 500 mL

## 2016-08-11 MED ORDER — FENTANYL CITRATE (PF) 100 MCG/2ML IJ SOLN
INTRAMUSCULAR | Status: DC | PRN
  Administered 2016-08-11: 25 ug via INTRAVENOUS

## 2016-08-11 MED ORDER — PHENYLEPHRINE HCL 10 MG/ML IJ SOLN
INTRAMUSCULAR | Status: DC | PRN
  Administered 2016-08-11: 40 ug via INTRAVENOUS
  Administered 2016-08-11 (×4): 80 ug via INTRAVENOUS

## 2016-08-11 MED ORDER — CHLORHEXIDINE GLUCONATE CLOTH 2 % EX PADS: 6.0000 | MEDICATED_PAD | Freq: Once | CUTANEOUS | Status: DC

## 2016-08-11 MED ORDER — PROPOFOL 10 MG/ML IV BOLUS
INTRAVENOUS | Status: DC | PRN
  Administered 2016-08-11: 40 mg via INTRAVENOUS

## 2016-08-11 MED ORDER — MIDAZOLAM HCL 2 MG/2ML IJ SOLN
INTRAMUSCULAR | Status: DC | PRN
  Administered 2016-08-11: 2 mg via INTRAVENOUS

## 2016-08-11 MED ORDER — HYDROMORPHONE HCL 1 MG/ML IJ SOLN: 0.2500 mg | INTRAMUSCULAR | Status: DC | PRN

## 2016-08-11 MED ORDER — EPHEDRINE SULFATE 50 MG/ML IJ SOLN
INTRAMUSCULAR | Status: DC | PRN
  Administered 2016-08-11: 5 mg via INTRAVENOUS
  Administered 2016-08-11: 10 mg via INTRAVENOUS
  Administered 2016-08-11: 5 mg via INTRAVENOUS
  Administered 2016-08-11: 10 mg via INTRAVENOUS

## 2016-08-11 MED ORDER — LIDOCAINE HCL (CARDIAC) 20 MG/ML IV SOLN
INTRAVENOUS | Status: DC | PRN
  Administered 2016-08-11: 80 mg via INTRAVENOUS

## 2016-08-11 MED ORDER — TRANDOLAPRIL 4 MG PO TABS: 8.0000 mg | ORAL_TABLET | Freq: Every day | ORAL | Status: DC

## 2016-08-11 MED ORDER — OXYCODONE-ACETAMINOPHEN 5-325 MG PO TABS: 1.0000 | ORAL_TABLET | Freq: Four times a day (QID) | ORAL | 0 refills | Status: DC | PRN

## 2016-08-11 MED ORDER — LIDOCAINE-EPINEPHRINE (PF) 1 %-1:200000 IJ SOLN
INTRAMUSCULAR | Status: DC | PRN
  Administered 2016-08-11: 30 mL

## 2016-08-11 MED ORDER — INSULIN GLARGINE 100 UNIT/ML SOLOSTAR PEN
37.0000 [IU] | PEN_INJECTOR | Freq: Every day | SUBCUTANEOUS | Status: DC
Start: 2016-08-11 — End: 2016-10-19

## 2016-08-11 MED ORDER — SODIUM CHLORIDE 0.9 % IV SOLN
INTRAVENOUS | Status: DC
  Administered 2016-08-11 (×2): via INTRAVENOUS

## 2016-08-11 MED ORDER — FUROSEMIDE 80 MG PO TABS: 80.0000 mg | ORAL_TABLET | Freq: Two times a day (BID) | ORAL | Status: DC

## 2016-08-11 MED ORDER — 0.9 % SODIUM CHLORIDE (POUR BTL) OPTIME
TOPICAL | Status: DC | PRN
  Administered 2016-08-11: 1000 mL

## 2016-08-11 MED ORDER — DEXTROSE 5 % IV SOLN
1.5000 g | INTRAVENOUS | Status: AC
  Administered 2016-08-11: 1.5 g via INTRAVENOUS
  Filled 2016-08-11: qty 1.5

## 2016-08-11 MED ORDER — MIDAZOLAM HCL 2 MG/2ML IJ SOLN
INTRAMUSCULAR | Status: AC
  Filled 2016-08-11: qty 2

## 2016-08-11 MED ORDER — HEPARIN SODIUM (PORCINE) 1000 UNIT/ML IJ SOLN
INTRAMUSCULAR | Status: AC
  Filled 2016-08-11: qty 3

## 2016-08-11 MED ORDER — LIDOCAINE-EPINEPHRINE (PF) 1 %-1:200000 IJ SOLN
INTRAMUSCULAR | Status: AC
  Filled 2016-08-11: qty 30

## 2016-08-11 MED ORDER — PROPOFOL 10 MG/ML IV BOLUS
INTRAVENOUS | Status: AC
  Filled 2016-08-11: qty 20

## 2016-08-11 MED ORDER — FENTANYL CITRATE (PF) 100 MCG/2ML IJ SOLN
INTRAMUSCULAR | Status: AC
  Filled 2016-08-11: qty 2

## 2016-08-11 MED ORDER — HEPARIN SODIUM (PORCINE) 1000 UNIT/ML IJ SOLN
INTRAMUSCULAR | Status: DC | PRN
  Administered 2016-08-11: 3 mL via INTRAVENOUS

## 2016-08-11 MED ORDER — DEXTROSE 50 % IV SOLN
25.0000 mL | Freq: Once | INTRAVENOUS | Status: AC
  Administered 2016-08-11: 25 mL via INTRAVENOUS
  Filled 2016-08-11: qty 50

## 2016-08-11 MED ORDER — PROPOFOL 500 MG/50ML IV EMUL
INTRAVENOUS | Status: DC | PRN
  Administered 2016-08-11: 75 ug/kg/min via INTRAVENOUS

## 2016-08-11 SURGICAL SUPPLY — 35 items
ARMBAND PINK RESTRICT EXTREMIT (MISCELLANEOUS) ×3 IMPLANT
CANISTER SUCT 3000ML PPV (MISCELLANEOUS) ×3 IMPLANT
CLIP TI MEDIUM 24 (CLIP) ×3 IMPLANT
CLIP TI MEDIUM 6 (CLIP) IMPLANT
CLIP TI WIDE RED SMALL 24 (CLIP) ×3 IMPLANT
CLIP TI WIDE RED SMALL 6 (CLIP) IMPLANT
COVER PROBE W GEL 5X96 (DRAPES) ×3 IMPLANT
DERMABOND ADVANCED (GAUZE/BANDAGES/DRESSINGS) ×2
DERMABOND ADVANCED .7 DNX12 (GAUZE/BANDAGES/DRESSINGS) ×1 IMPLANT
ELECT REM PT RETURN 9FT ADLT (ELECTROSURGICAL) ×3
ELECTRODE REM PT RTRN 9FT ADLT (ELECTROSURGICAL) ×1 IMPLANT
GLOVE BIO SURGEON STRL SZ 6.5 (GLOVE) ×6 IMPLANT
GLOVE BIO SURGEONS STRL SZ 6.5 (GLOVE) ×3
GLOVE BIOGEL PI IND STRL 6.5 (GLOVE) ×2 IMPLANT
GLOVE BIOGEL PI IND STRL 7.5 (GLOVE) ×1 IMPLANT
GLOVE BIOGEL PI INDICATOR 6.5 (GLOVE) ×4
GLOVE BIOGEL PI INDICATOR 7.5 (GLOVE) ×2
GLOVE SURG SS PI 7.5 STRL IVOR (GLOVE) ×3 IMPLANT
GOWN STRL REUS W/ TWL LRG LVL3 (GOWN DISPOSABLE) ×3 IMPLANT
GOWN STRL REUS W/ TWL XL LVL3 (GOWN DISPOSABLE) ×1 IMPLANT
GOWN STRL REUS W/TWL LRG LVL3 (GOWN DISPOSABLE) ×6
GOWN STRL REUS W/TWL XL LVL3 (GOWN DISPOSABLE) ×2
HEMOSTAT SNOW SURGICEL 2X4 (HEMOSTASIS) IMPLANT
KIT BASIN OR (CUSTOM PROCEDURE TRAY) ×3 IMPLANT
KIT ROOM TURNOVER OR (KITS) ×3 IMPLANT
NS IRRIG 1000ML POUR BTL (IV SOLUTION) ×3 IMPLANT
PACK CV ACCESS (CUSTOM PROCEDURE TRAY) ×3 IMPLANT
PAD ARMBOARD 7.5X6 YLW CONV (MISCELLANEOUS) ×6 IMPLANT
SUT PROLENE 6 0 CC (SUTURE) ×3 IMPLANT
SUT SILK 2 0 SH (SUTURE) ×3 IMPLANT
SUT VIC AB 3-0 SH 27 (SUTURE) ×2
SUT VIC AB 3-0 SH 27X BRD (SUTURE) ×1 IMPLANT
SUT VICRYL 4-0 PS2 18IN ABS (SUTURE) ×3 IMPLANT
UNDERPAD 30X30 (UNDERPADS AND DIAPERS) ×3 IMPLANT
WATER STERILE IRR 1000ML POUR (IV SOLUTION) ×3 IMPLANT

## 2016-08-11 NOTE — Discharge Instructions (Signed)
° ° °  08/11/2016 Douglas Meyer [id number] 11/30/1940  Surgeon(s): Serafina Mitchell, MD  Procedure(s): BASILIC VEIN TRANSPOSITION-LEFT 1ST STAGE  x Do not stick fistula for 12 weeks

## 2016-08-11 NOTE — Interval H&P Note (Signed)
History and Physical Interval Note:  08/11/2016 7:20 AM  Douglas Meyer  has presented today for surgery, with the diagnosis of Stage 4 Chronic Kidney Disease  N18.4  The various methods of treatment have been discussed with the patient and family. After consideration of risks, benefits and other options for treatment, the patient has consented to  Procedure(s): Boomer (Left) as a surgical intervention .  The patient's history has been reviewed, patient examined, no change in status, stable for surgery.  I have reviewed the patient's chart and labs.  Questions were answered to the patient's satisfaction.     [name]

## 2016-08-11 NOTE — Anesthesia Postprocedure Evaluation (Signed)
Anesthesia Post Note  Patient: Douglas Meyer  Procedure(s) Performed: Procedure(s) (LRB): BASCILIC VEIN TRANSPOSITION-LEFT 1ST STAGE (Left)  Patient location during evaluation: PACU Anesthesia Type: MAC Level of consciousness: awake and alert Pain management: pain level controlled Vital Signs Assessment: post-procedure vital signs reviewed and stable Respiratory status: spontaneous breathing, nonlabored ventilation and respiratory function stable Cardiovascular status: stable and blood pressure returned to baseline Anesthetic complications: no       Last Vitals:  Vitals:   08/11/16 1000 08/11/16 1015  BP: (!) 111/55 (!) 117/55  Pulse: 63 66  Resp: 16 15  Temp:  36.4 C    Last Pain:  Vitals:   08/11/16 0554  TempSrc: Oral                 Vickie Ponds,W. EDMOND

## 2016-08-11 NOTE — H&P (View-Only) (Signed)
Patient name: Douglas Meyer MRN: [id number] DOB: August 15, 1940 Sex: male  REASON FOR VISIT:     Follow-up  HISTORY OF PRESENT ILLNESS:   Douglas Meyer is a 76 y.o. male returns today for evaluation of his right brachiocephalic fistula which was created on 06/19/2015.  He underwent a fistula duplex on 227 which showed an excellent caliber vein, with all measurements greater than 0.5 cm.  There were 2 branches and a valve leaflet with possible thrombus.  Unfortunately his fistula has occluded.  He is not yet on dialysis  CURRENT MEDICATIONS:    Current Outpatient Prescriptions  Medication Sig Dispense Refill  . ACCU-CHEK AVIVA PLUS test strip USE ONE STRIP ONCE DAILY 100 each 3  . allopurinol (ZYLOPRIM) 300 MG tablet TAKE 1 BY MOUTH DAILY 90 tablet 3  . amLODipine (NORVASC) 5 MG tablet Take 1 tablet (5 mg total) by mouth daily. 90 tablet 3  . Ascorbic Acid (VITAMIN C) 500 MG tablet Take 500 mg by mouth daily.      Marland Kitchen aspirin 81 MG tablet Take 81 mg by mouth daily.    Marland Kitchen atorvastatin (LIPITOR) 20 MG tablet TAKE ONE TABLET BY MOUTH ONCE DAILY AT  6:00  PM 90 tablet 3  . BD PEN NEEDLE NANO U/F 32G X 4 MM MISC USE AS DIRECTED ONCE DAILY 100 each 1  . calcitRIOL (ROCALTROL) 0.5 MCG capsule Take 0.5 mcg by mouth every other day.     . furosemide (LASIX) 80 MG tablet Take 1 tablet (80 mg total) by mouth daily. (Patient taking differently: Take 160 mg by mouth daily. ) 90 tablet 3  . glipiZIDE (GLUCOTROL) 10 MG tablet Take 1 tablet (10 mg total) by mouth daily. 90 tablet 3  . glucose blood (ACCU-CHEK AVIVA PLUS) test strip USE ONE STRIP ONCE DAILY 100 each 5  . hydrocortisone 2.5 % cream   0  . Insulin Glargine (LANTUS SOLOSTAR) 100 UNIT/ML Solostar Pen 41UNITS SUBCUTANEOUSLY AT BEDTIME 45 pen 6  . Lancets (ACCU-CHEK MULTICLIX) lancets 1 each by Other route daily. Dx 250.01 102 each 3  . metoprolol (TOPROL-XL) 200 MG 24 hr tablet TAKE ONE-HALF TABLET BY MOUTH EVERY  DAY 180 tablet 0  . Multiple Vitamins-Minerals (MULTIVITAMIN WITH MINERALS) tablet Take 1 tablet by mouth daily.      . nitroGLYCERIN (NITROSTAT) 0.4 MG SL tablet Place 0.4 mg under the tongue every 5 (five) minutes as needed for chest pain.    . Omega-3 Fatty Acids (FISH OIL) 500 MG CAPS Take 1 capsule by mouth daily.    . trandolapril (MAVIK) 4 MG tablet Take 1 tablet (4 mg total) by mouth 2 (two) times daily. NEEDS APPOINTMENT FOR FUTURE REFILLS 180 tablet 0   No current facility-administered medications for this visit.     REVIEW OF SYSTEMS:   [X]  denotes positive finding, [ ]  denotes negative finding Cardiac  Comments:  Chest pain or chest pressure:    Shortness of breath upon exertion:    Short of breath when lying flat:    Irregular heart rhythm:    Constitutional    Fever or chills:      PHYSICAL EXAM:   Vitals:   08/01/16 0912  BP: (!) 152/75  Pulse: (!) 59  Resp: 16  Temp: 97.7 F (36.5 C)  TempSrc: Oral  SpO2: 100%  Weight: 161 lb (73 kg)  Height: 5' 5.2" (1.656 m)    GENERAL: The patient is a well-nourished male, in no acute distress. The vital  signs are documented above. CARDIOVASCULAR: There is a regular rate and rhythm. PULMONARY: Non-labored respirations No thrill within fistula  STUDIES:      MEDICAL ISSUES:   Despite a duplex which showed a widely patent fistula, 5 days ago, this is now occluded.  I think the best course of action is to proceed with a left basilic vein transposition.  We will do this in stages.  The first stage is been scheduled for Thursday, March 8  Douglas Major, MD [facility] [phone] Pager [phone]

## 2016-08-11 NOTE — Transfer of Care (Signed)
Immediate Anesthesia Transfer of Care Note  Patient: Douglas Meyer  Procedure(s) Performed: Procedure(s): BASCILIC VEIN TRANSPOSITION-LEFT 1ST STAGE (Left)  Patient Location: PACU  Anesthesia Type:MAC  Level of Consciousness: awake, alert  and oriented  Airway & Oxygen Therapy: Patient Spontanous Breathing and Patient connected to face mask oxygen  Post-op Assessment: Report given to RN and Post -op Vital signs reviewed and stable  Post vital signs: Reviewed and stable  Last Vitals:  Vitals:   08/11/16 0554 08/11/16 0611  BP: 132/70 (!) 149/63  Pulse: (!) 58 60  Resp: 16 16  Temp: 36.8 C 36.4 C    Last Pain:  Vitals:   08/11/16 0554  TempSrc: Oral      Patients Stated Pain Goal: 5 (63/84/53 6468)  Complications: No apparent anesthesia complications

## 2016-08-11 NOTE — Op Note (Signed)
    Patient name: Douglas Meyer MRN: [id number] DOB: 1940-11-20 Sex: male  08/11/2016 Pre-operative Diagnosis: CKD Post-operative diagnosis:  Same Surgeon:  Annamarie Meyer Assistants:  S. Rhyne Procedure:   Left first stage basilic vein fistula Anesthesia:  MAC Blood Loss:  See anesthesia record Specimens:  none  Findings:  40m vein, 5 mm disease free artery  Indications:  The patient is s/p failed brachiocephalic fistula.  He comes in today for a first stage left basilic vein fistula.  Procedure:  The patient was identified in the holding area and [place]  The patient was then placed supine on the table. MAC anesthesia was administered.  The patient was prepped and draped in the usual sterile fashion.  A time out was called and antibiotics were adminiIs used to evaluate the left basilic vein.  It was of excellent caliber throughout the upper arm.  1% lidocaine was for local anesthesia.  An oblique incision was made at the antecubital crease.  Through this incision I dissected out the basilic vein.  It measured approximately 6 mm.  Side branches were ligated between silk ties.  The vein was marked for orientation.  Next, I dissected out the brachial artery.  This was a healthy-appearing 5 mm artery.  3000 units of heparin was given.  The vein was ligated distally.  It distended nicely with heparinized saline.  After the heparin circulated the artery was occluded with Serafin clamps.  A #11 blade was used to make an arteriotomy which was extended longitudinally with Potts scissors.  The vein was cut to the appropriate length and bevel to fit the size of the arteriotomy.  Next, a running anastomosis was created with 6-0 proline.  Prior to completion, the appropriate flushing maneuvers were performed and the anastomosis was completed.  The patient maintained a biphasic radial artery Doppler signals at the wrist.  There was excellent thrill within the fistula.  25 mg of protamine was given.   Once hemostasis was satisfactory, the incision was closed in 2 layers of 3-0 Vicryls followed by Dermabond.   Disposition:  To PACU stable   Douglas Meyer M.D. [facility] [place]: [phone]:  [phone]

## 2016-08-12 ENCOUNTER — Encounter (HOSPITAL_COMMUNITY): Payer: Self-pay | Admitting: Surgery

## 2016-08-12 ENCOUNTER — Telehealth: Payer: Self-pay | Admitting: Surgery

## 2016-08-12 LAB — HEMOGLOBIN A1C
HEMOGLOBIN A1C: 6.7 % — AB (ref 4.8–5.6)
Mean Plasma Glucose: 146 mg/dL

## 2016-08-12 NOTE — Telephone Encounter (Signed)
-----   Message from Mena Goes, RN sent at 08/11/2016  2:57 PM EST ----- Regarding: schedule 4-6 weeks w/ duplex   ----- Message ----- From: Serafina Mitchell, MD Sent: 08/11/2016   9:42 AM To: Vvs Charge Pool  08-11-2016 Surgeon:  [name] Assistants:  S. Rhyne Procedure:   Left first stage basilic vein fistula   F/u 6 weeks w/ duplex of fistula

## 2016-08-12 NOTE — Telephone Encounter (Signed)
Spoke to pt and gave PO for 4/16 (was already sched that day for carotid f/u with NP but that will need to be res to a later date)   Mailed letter

## 2016-08-15 ENCOUNTER — Other Ambulatory Visit: Payer: Self-pay | Admitting: Cardiology

## 2016-08-15 LAB — GLUCOSE, CAPILLARY: Glucose-Capillary: 88 mg/dL (ref 65–99)

## 2016-08-16 LAB — HM DIABETES EYE EXAM

## 2016-08-17 ENCOUNTER — Encounter: Payer: Self-pay | Admitting: Family Medicine

## 2016-08-23 ENCOUNTER — Ambulatory Visit (INDEPENDENT_AMBULATORY_CARE_PROVIDER_SITE_OTHER): Payer: Medicare Other | Admitting: Adult Health

## 2016-08-23 ENCOUNTER — Encounter: Payer: Self-pay | Admitting: Adult Health

## 2016-08-23 VITALS — BP 140/50 | Temp 97.6°F | Wt 165.4 lb

## 2016-08-23 DIAGNOSIS — E108 Type 1 diabetes mellitus with unspecified complications: Secondary | ICD-10-CM

## 2016-08-23 NOTE — Progress Notes (Signed)
Subjective:    Patient ID: Douglas Meyer, male    DOB: [birth year], 76 y.o.   MRN: [id number]  HPI   76 year old male who  has a past medical history of Bilateral carotid artery disease ([place]); CAD (coronary artery disease) (05/15/10); Cancer ([place]); CKD (chronic kidney disease), stage IV ([place]); Diabetes mellitus; ED (erectile dysfunction); Gout; History of kidney stones; cardiovascular stress test; Hyperlipidemia; Hypertension; Myocardial infarction; Nephrolithiasis; Polyp of colon; and PVD (peripheral vascular disease) ([place]). He is a patient of Dr. Sherren Mocha who I am seeing today for the complaint of hypoglycemia. He reports that over the last two weeks he has noticed blood sugars " well below 100, usually in the 50-60's. This morning it was 40." He has not changed his diet and has not been exercising any more than normal   He has cut his Lantus dose from 37 to 35 and continues to have lows.    Review of Systems See HPI   Past Medical History:  Diagnosis Date  . Bilateral carotid artery disease ([place])   . CAD (coronary artery disease) 05/15/10   out of hospital myocardial infarction in the 1990's. this was picked up on an  EKG in 1997.  Cardiac catherterization demonstrated an occluded vessel and collateras.  I have no description of this.  A stress  perfusion study  done 2008 demonstrated an ejection fraction  of  53% with inferolateral  hypokinesis.  There was an inferolateral defect with  scar and some mild mixed ischemia.   managed rx  . Cancer ([place])    skin  . CKD (chronic kidney disease), stage IV ([place])   . Diabetes mellitus    DM, type II  . ED (erectile dysfunction)   . Gout   . History of kidney stones   . Hx of cardiovascular stress test    a. Lexiscan myoview 10/18/12: EF 53%, inf and inf-lat scar, slight peri-infarct ischemia, small area of apical ischemia; no change from 2006  . Hyperlipidemia   . Hypertension   . Myocardial infarction   . Nephrolithiasis   . Polyp of colon     . PVD (peripheral vascular disease) ([place])     Social History   Social History  . Marital status: Married    Spouse name: N/A  . Number of children: 4  . Years of education: N/A   Occupational History  .  [employer]    retired  .  Retired   Social History Main Topics  . Smoking status: Former Smoker    Years: 6.00    Quit date: 06/06/1973  . Smokeless tobacco: Never Used  . Alcohol use 4.2 oz/week    7 Cans of beer per week     Comment: 1 beer daily  . Drug use: No  . Sexual activity: Not on file   Other Topics Concern  . Not on file   Social History Narrative   Lives with wife.      Past Surgical History:  Procedure Laterality Date  . AV FISTULA PLACEMENT Right 06/19/2015   Procedure: ARTERIOVENOUS (AV) FISTULA CREATION;  Surgeon: Serafina Mitchell, MD;  Location: [facility];  Service: Vascular;  Laterality: Right;  . BASCILIC VEIN TRANSPOSITION Left 08/11/2016   Procedure: BASCILIC VEIN TRANSPOSITION-LEFT 1ST STAGE;  Surgeon: Serafina Mitchell, MD;  Location: [facility];  Service: Vascular;  Laterality: Left;  . COLONOSCOPY W/ POLYPECTOMY    . CYSTOSCOPY     stone removal x 1  . ENDARTERECTOMY  Right 11/28/2014   Procedure: RIGHT CAROTID ENDARTERECTOMY;  Surgeon: Serafina Mitchell, MD;  Location: [facility];  Service: Vascular;  Laterality: Right;  . EYE SURGERY  06/2013   bilateral cataract surgery  . HUMERUS FRACTURE SURGERY     left  . MOHS SURGERY     multiple  . PATCH ANGIOPLASTY Right 11/28/2014   Procedure: PATCH ANGIOPLASTY RIGHT CAROTID;  Surgeon: Serafina Mitchell, MD;  Location: [facility] OR;  Service: Vascular;  Laterality: Right;    Family History  Problem Relation Age of Onset  . Heart attack Father     deceased 4  . Diabetes Brother     rheumatic fever, peptic ulcer disease, deceased  . Diabetes Sister     deceased 58  . Heart attack Mother     deceased age 104s  . Diabetes type II Brother   . Diabetes type II Brother     Allergies  Allergen Reactions  . No Known  Allergies     Current Outpatient Prescriptions on File Prior to Visit  Medication Sig Dispense Refill  . ACCU-CHEK AVIVA PLUS test strip USE ONE STRIP ONCE DAILY 100 each 3  . allopurinol (ZYLOPRIM) 300 MG tablet TAKE 1 BY MOUTH DAILY (Patient taking differently: Take 300 mg by mouth daily. ) 90 tablet 3  . amLODipine (NORVASC) 5 MG tablet Take 1 tablet (5 mg total) by mouth daily. 90 tablet 3  . Ascorbic Acid (VITAMIN C) 500 MG tablet Take 500 mg by mouth daily.      Marland Kitchen aspirin 81 MG tablet Take 81 mg by mouth daily.    Marland Kitchen atorvastatin (LIPITOR) 20 MG tablet TAKE ONE TABLET BY MOUTH ONCE DAILY AT  6:00  PM (Patient taking differently: Take 20 mg by mouth daily at 6 PM. ) 90 tablet 3  . BD PEN NEEDLE NANO U/F 32G X 4 MM MISC USE AS DIRECTED ONCE DAILY 100 each 1  . calcitRIOL (ROCALTROL) 0.5 MCG capsule Take 0.5 mcg by mouth every other day.     . furosemide (LASIX) 80 MG tablet Take 1 tablet (80 mg total) by mouth 2 (two) times daily.    Marland Kitchen glipiZIDE (GLUCOTROL) 10 MG tablet Take 1 tablet (10 mg total) by mouth daily. (Patient taking differently: Take 10 mg by mouth every evening. ) 90 tablet 3  . glucose blood (ACCU-CHEK AVIVA PLUS) test strip USE ONE STRIP ONCE DAILY 100 each 5  . Insulin Glargine (LANTUS SOLOSTAR) 100 UNIT/ML Solostar Pen Inject 37 Units into the skin at bedtime.    . Lancets (ACCU-CHEK MULTICLIX) lancets 1 each by Other route daily. Dx 250.01 102 each 3  . metoprolol (TOPROL-XL) 200 MG 24 hr tablet TAKE ONE-HALF TABLET BY MOUTH EVERY DAY (Patient taking differently: Take 100 mg by mouth at bedtime. ) 180 tablet 0  . Multiple Vitamins-Minerals (MULTIVITAMIN WITH MINERALS) tablet Take 1 tablet by mouth daily.      . nitroGLYCERIN (NITROSTAT) 0.4 MG SL tablet Place 0.4 mg under the tongue every 5 (five) minutes as needed for chest pain.    . Omega-3 Fatty Acids (FISH OIL) 500 MG CAPS Take 500 mg by mouth daily.     . trandolapril (MAVIK) 4 MG tablet Take 2 tablets (8 mg total)  by mouth at bedtime. NEEDS APPOINTMENT FOR FUTURE REFILLS    . trandolapril (MAVIK) 4 MG tablet TAKE ONE TABLET BY MOUTH TWICE DAILY...PATIENT NEEDS APPOINTMENT FOR FUTURE REFILLS 60 tablet 0   No current facility-administered medications  on file prior to visit.     BP (!) 140/50 (BP Location: Right Arm, Patient Position: Sitting, Cuff Size: Normal)   Temp 97.6 F (36.4 C) (Oral)   Wt 165 lb 6.4 oz (75 kg)   BMI 26.70 kg/m       Objective:   Physical Exam  Constitutional: He is oriented to person, place, and time. He appears well-developed and well-nourished. No distress.  Cardiovascular: Normal rate, regular rhythm, normal heart sounds and intact distal pulses.  Exam reveals no gallop and no friction rub.   No murmur heard. Pulmonary/Chest: Effort normal and breath sounds normal. No respiratory distress. He has no wheezes. He has no rales. He exhibits no tenderness.  Neurological: He is alert and oriented to person, place, and time.  Skin: Skin is warm and dry. No rash noted. He is not diaphoretic. No erythema. No pallor.  Psychiatric: He has a normal mood and affect. His behavior is normal. Judgment and thought content normal.  Nursing note and vitals reviewed.     Assessment & Plan:  1. Type 1 diabetes mellitus with complication (HCC) - I am going to have him cut his Lantus back to 28 units. Monitor blood sugars over the next 3 days. If BS if above 150 consistently then he can increase by 2 units every 3 days until BS between 100-130.   Dorothyann Peng, NP

## 2016-08-23 NOTE — Patient Instructions (Signed)
It was great seeing you again   Currently you are taking 35 units of Lantus. I would like you to drop to 28 units and then monitor your blood sugars. If you are above 150 consistently, please add two units of Lantus every 3 days until your blood sugars are between 100-130   Follow up as needed

## 2016-09-08 ENCOUNTER — Other Ambulatory Visit: Payer: Self-pay | Admitting: Cardiology

## 2016-09-08 ENCOUNTER — Encounter: Payer: Self-pay | Admitting: Surgery

## 2016-09-13 ENCOUNTER — Telehealth: Payer: Self-pay | Admitting: Family Medicine

## 2016-09-13 NOTE — Telephone Encounter (Signed)
Pt need new Rx for allopurinol #90  Pharm:  Insurance claims handler on [facility].  Pt state he is out of the medication.

## 2016-09-15 NOTE — Telephone Encounter (Signed)
This medication was last refilled 04/26/16 for #90 with 3 refills. Patient should not be due for a medication refill.  I contacted pharmacy to verify that there was refills left - and pharmacist states yes, there is refill ready to be picked up.  I contacted patient and notified him that Rx can be picked up at pharmacy.

## 2016-09-16 ENCOUNTER — Other Ambulatory Visit: Payer: Self-pay | Admitting: Family Medicine

## 2016-09-19 ENCOUNTER — Encounter: Payer: Medicare Other | Admitting: Surgery

## 2016-09-19 ENCOUNTER — Ambulatory Visit: Payer: Medicare Other | Admitting: Family

## 2016-09-19 ENCOUNTER — Ambulatory Visit (HOSPITAL_COMMUNITY): Payer: Medicare Other

## 2016-09-21 ENCOUNTER — Other Ambulatory Visit: Payer: Self-pay | Admitting: Family Medicine

## 2016-09-21 ENCOUNTER — Other Ambulatory Visit: Payer: Self-pay | Admitting: Cardiology

## 2016-09-21 DIAGNOSIS — M1A0790 Idiopathic chronic gout, unspecified ankle and foot, without tophus (tophi): Secondary | ICD-10-CM

## 2016-09-21 MED ORDER — ALLOPURINOL 300 MG PO TABS: ORAL_TABLET | ORAL | 1 refills | Status: DC

## 2016-09-21 NOTE — Telephone Encounter (Signed)
Sent locally to [facility] on 04/19/16.  Now sent to 90 day supply company for 6 months.

## 2016-09-28 ENCOUNTER — Encounter: Payer: Self-pay | Admitting: Surgery

## 2016-09-30 ENCOUNTER — Other Ambulatory Visit: Payer: Self-pay | Admitting: *Deleted

## 2016-09-30 DIAGNOSIS — N186 End stage renal disease: Secondary | ICD-10-CM

## 2016-10-03 ENCOUNTER — Ambulatory Visit (HOSPITAL_COMMUNITY)
Admission: RE | Admit: 2016-10-03 | Discharge: 2016-10-03 | Disposition: A | Discharge status: Home / Self Care | Payer: Medicare Other | Source: Ambulatory Visit | Attending: Family | Admitting: Family

## 2016-10-03 DIAGNOSIS — I6521 Occlusion and stenosis of right carotid artery: Secondary | ICD-10-CM

## 2016-10-03 DIAGNOSIS — N186 End stage renal disease: Secondary | ICD-10-CM | POA: Diagnosis not present

## 2016-10-03 DIAGNOSIS — I6522 Occlusion and stenosis of left carotid artery: Secondary | ICD-10-CM

## 2016-10-05 ENCOUNTER — Encounter: Payer: Medicare Other | Admitting: Surgery

## 2016-10-10 ENCOUNTER — Other Ambulatory Visit: Payer: Self-pay

## 2016-10-10 ENCOUNTER — Encounter: Payer: Self-pay | Admitting: Surgery

## 2016-10-10 ENCOUNTER — Ambulatory Visit (INDEPENDENT_AMBULATORY_CARE_PROVIDER_SITE_OTHER): Payer: Self-pay | Admitting: Surgery

## 2016-10-10 VITALS — BP 153/67 | HR 93 | Temp 97.6°F | Resp 20 | Ht 66.0 in | Wt 165.8 lb

## 2016-10-10 DIAGNOSIS — N184 Chronic kidney disease, stage 4 (severe): Secondary | ICD-10-CM

## 2016-10-10 NOTE — Progress Notes (Signed)
Patient name: Douglas Meyer MRN: [id number] DOB: 08-22-1940 Sex: male  REASON FOR VISIT:     post op  HISTORY OF PRESENT ILLNESS:   Douglas Meyer is a 76 y.o. male who returns today for follow-up.  He is status post first stage left basilic vein fistula on 97/98/9211.  Previously he had a failed right brachiocephalic fistula.  He reports no symptoms of steal  CURRENT MEDICATIONS:    Current Outpatient Prescriptions  Medication Sig Dispense Refill  . allopurinol (ZYLOPRIM) 300 MG tablet TAKE 1 BY MOUTH DAILY 90 tablet 1  . amLODipine (NORVASC) 5 MG tablet Take 1 tablet (5 mg total) by mouth daily. 90 tablet 3  . Ascorbic Acid (VITAMIN C) 500 MG tablet Take 500 mg by mouth daily.      Marland Kitchen aspirin 81 MG tablet Take 81 mg by mouth daily.    Marland Kitchen atorvastatin (LIPITOR) 20 MG tablet TAKE ONE TABLET BY MOUTH ONCE DAILY AT  6:00  PM (Patient taking differently: Take 20 mg by mouth daily at 6 PM. ) 90 tablet 3  . BD PEN NEEDLE NANO U/F 32G X 4 MM MISC USE AS DIRECTED ONCE DAILY 100 each 1  . calcitRIOL (ROCALTROL) 0.5 MCG capsule Take 0.5 mcg by mouth every other day.     . furosemide (LASIX) 80 MG tablet Take 1 tablet (80 mg total) by mouth 2 (two) times daily.    Marland Kitchen glipiZIDE (GLUCOTROL) 10 MG tablet Take 1 tablet (10 mg total) by mouth daily. (Patient taking differently: Take 10 mg by mouth every evening. ) 90 tablet 3  . Insulin Glargine (LANTUS SOLOSTAR) 100 UNIT/ML Solostar Pen Inject 37 Units into the skin at bedtime.    . Lancets (ACCU-CHEK MULTICLIX) lancets 1 each by Other route daily. Dx 250.01 102 each 3  . LANTUS SOLOSTAR 100 UNIT/ML Solostar Pen INJECT 41 UNITS SUBCUTANEOUSLY AT BEDTIME 3 pen 5  . metoprolol (TOPROL-XL) 200 MG 24 hr tablet TAKE ONE-HALF TABLET BY MOUTH EVERY DAY (Patient taking differently: Take 100 mg by mouth at bedtime. ) 180 tablet 0  . Multiple Vitamins-Minerals (MULTIVITAMIN WITH MINERALS) tablet Take 1 tablet by mouth daily.        . nitroGLYCERIN (NITROSTAT) 0.4 MG SL tablet Place 0.4 mg under the tongue every 5 (five) minutes as needed for chest pain.    . Omega-3 Fatty Acids (FISH OIL) 500 MG CAPS Take 500 mg by mouth daily.     . trandolapril (MAVIK) 4 MG tablet Take 2 tablets (8 mg total) by mouth at bedtime. NEEDS APPOINTMENT FOR FUTURE REFILLS    . trandolapril (MAVIK) 4 MG tablet TAKE 1 TABLET BY MOUTH TWICE DAILY **PATIENT NEEDS APPOINTMENT FOR FURTHER REFILLS** 60 tablet 0  . ACCU-CHEK AVIVA PLUS test strip USE ONE STRIP ONCE DAILY (Patient not taking: Reported on 10/10/2016) 100 each 3  . glucose blood (ACCU-CHEK AVIVA PLUS) test strip USE ONE STRIP ONCE DAILY (Patient not taking: Reported on 10/10/2016) 100 each 5   No current facility-administered medications for this visit.     REVIEW OF SYSTEMS:   [X]  denotes positive finding, [ ]  denotes negative finding Cardiac  Comments:  Chest pain or chest pressure:    Shortness of breath upon exertion:    Short of breath when lying flat:    Irregular heart rhythm:    Constitutional    Fever or chills:      PHYSICAL EXAM:   Vitals:   10/10/16 1528 10/10/16 1530  BP: Marland Kitchen)  155/67 (!) 153/67  Pulse: 93   Resp: 20   Temp: 97.6 F (36.4 C)   TempSrc: Oral   SpO2: 100%   Weight: 165 lb 12.8 oz (75.2 kg)   Height: 5\' 6"  (1.676 m)     GENERAL: The patient is a well-nourished male, in no acute distress. The vital signs are documented above. CARDIOVASCULAR: There is a regular rate and rhythm. PULMONARY: Non-labored respirations Palpable thrill within fistula  STUDIES:    I evaluated the patient was on a site.  It appears to be of excellent caliber.   MEDICAL ISSUES:    The patient will be scheduled for a left second stage basilic vein procedure on Friday, May 18.  There is a benefits of the procedure discussed the patient off) today  [name], MD [facility] [phone] Pager [phone]

## 2016-10-11 ENCOUNTER — Other Ambulatory Visit: Payer: Self-pay | Admitting: *Deleted

## 2016-10-11 ENCOUNTER — Other Ambulatory Visit: Payer: Self-pay | Admitting: Surgery

## 2016-10-20 ENCOUNTER — Encounter (HOSPITAL_COMMUNITY): Payer: Self-pay | Admitting: *Deleted

## 2016-10-20 NOTE — Progress Notes (Signed)
Pt denies SOB and chest pain. Pt is under the care of Dr. Percival Spanish, Cardiology. Pt stated that a cardiac cath was performed > 20 years ago. Pt stated that he was advised to take half dose of Lantus insulin (13 units) tonight by surgeon's staff and a snack before midnight.. Pt made aware of diabetes protocol to not take Glipizide tonight, check BG every 2 hours prior to arrival, drink 4 oz. of Apple or Cranberry Juice for BG <70,  recheck BG 15 minutes after drinking juice, if BG remains < 70 after intervention to call SS. Pt made aware to stop taking otc vitamins, fish oil and herbal medications. Do not take any NSAIDs ie: Ibuprofen, Advil, Naproxen, BC and Goody Powder. Pt verbalized understanding of all pre-op instructions.

## 2016-10-21 ENCOUNTER — Ambulatory Visit (HOSPITAL_COMMUNITY): Payer: Medicare Other | Admitting: Emergency Medicine

## 2016-10-21 ENCOUNTER — Encounter (HOSPITAL_COMMUNITY)
Admission: RE | Disposition: A | Discharge status: Home / Self Care | Payer: Self-pay | Source: Ambulatory Visit | Attending: Surgery

## 2016-10-21 ENCOUNTER — Encounter (HOSPITAL_COMMUNITY): Payer: Self-pay | Admitting: *Deleted

## 2016-10-21 ENCOUNTER — Ambulatory Visit (HOSPITAL_COMMUNITY)
Admission: RE | Admit: 2016-10-21 | Discharge: 2016-10-21 | Disposition: A | Discharge status: Home / Self Care | Payer: Medicare Other | Source: Ambulatory Visit | Attending: Surgery | Admitting: Surgery

## 2016-10-21 DIAGNOSIS — E785 Hyperlipidemia, unspecified: Secondary | ICD-10-CM | POA: Diagnosis not present

## 2016-10-21 DIAGNOSIS — Z87891 Personal history of nicotine dependence: Secondary | ICD-10-CM | POA: Diagnosis not present

## 2016-10-21 DIAGNOSIS — E108 Type 1 diabetes mellitus with unspecified complications: Secondary | ICD-10-CM

## 2016-10-21 DIAGNOSIS — N184 Chronic kidney disease, stage 4 (severe): Secondary | ICD-10-CM | POA: Insufficient documentation

## 2016-10-21 DIAGNOSIS — Z7982 Long term (current) use of aspirin: Secondary | ICD-10-CM | POA: Insufficient documentation

## 2016-10-21 DIAGNOSIS — I2510 Atherosclerotic heart disease of native coronary artery without angina pectoris: Secondary | ICD-10-CM | POA: Insufficient documentation

## 2016-10-21 DIAGNOSIS — E1122 Type 2 diabetes mellitus with diabetic chronic kidney disease: Secondary | ICD-10-CM | POA: Diagnosis not present

## 2016-10-21 DIAGNOSIS — Z79899 Other long term (current) drug therapy: Secondary | ICD-10-CM | POA: Diagnosis not present

## 2016-10-21 DIAGNOSIS — N189 Chronic kidney disease, unspecified: Secondary | ICD-10-CM | POA: Diagnosis present

## 2016-10-21 DIAGNOSIS — I129 Hypertensive chronic kidney disease with stage 1 through stage 4 chronic kidney disease, or unspecified chronic kidney disease: Secondary | ICD-10-CM | POA: Insufficient documentation

## 2016-10-21 DIAGNOSIS — Z794 Long term (current) use of insulin: Secondary | ICD-10-CM | POA: Diagnosis not present

## 2016-10-21 DIAGNOSIS — I252 Old myocardial infarction: Secondary | ICD-10-CM | POA: Insufficient documentation

## 2016-10-21 DIAGNOSIS — I10 Essential (primary) hypertension: Secondary | ICD-10-CM

## 2016-10-21 DIAGNOSIS — N185 Chronic kidney disease, stage 5: Secondary | ICD-10-CM | POA: Diagnosis not present

## 2016-10-21 HISTORY — PX: BASCILIC VEIN TRANSPOSITION: SHX5742

## 2016-10-21 LAB — GLUCOSE, CAPILLARY
GLUCOSE-CAPILLARY: 83 mg/dL (ref 65–99)
Glucose-Capillary: 113 mg/dL — ABNORMAL HIGH (ref 65–99)
Glucose-Capillary: 75 mg/dL (ref 65–99)

## 2016-10-21 LAB — POCT I-STAT 4, (NA,K, GLUC, HGB,HCT)
Glucose, Bld: 131 mg/dL — ABNORMAL HIGH (ref 65–99)
HEMATOCRIT: 34 % — AB (ref 39.0–52.0)
HEMOGLOBIN: 11.6 g/dL — AB (ref 13.0–17.0)
POTASSIUM: 3.4 mmol/L — AB (ref 3.5–5.1)
SODIUM: 142 mmol/L (ref 135–145)

## 2016-10-21 SURGERY — TRANSPOSITION, VEIN, BASILIC
Anesthesia: General | Site: Arm Upper | Laterality: Left

## 2016-10-21 MED ORDER — METOPROLOL SUCCINATE ER 200 MG PO TB24: 100.0000 mg | ORAL_TABLET | Freq: Every day | ORAL | Status: DC

## 2016-10-21 MED ORDER — PROPOFOL 10 MG/ML IV BOLUS
INTRAVENOUS | Status: AC
  Filled 2016-10-21: qty 20

## 2016-10-21 MED ORDER — ROCURONIUM BROMIDE 100 MG/10ML IV SOLN
INTRAVENOUS | Status: DC | PRN
  Administered 2016-10-21: 10 mg via INTRAVENOUS
  Administered 2016-10-21: 20 mg via INTRAVENOUS

## 2016-10-21 MED ORDER — SUGAMMADEX SODIUM 200 MG/2ML IV SOLN
INTRAVENOUS | Status: DC | PRN
  Administered 2016-10-21: 150 mg via INTRAVENOUS

## 2016-10-21 MED ORDER — PHENYLEPHRINE HCL 10 MG/ML IJ SOLN: INTRAMUSCULAR | Status: DC | PRN

## 2016-10-21 MED ORDER — EPHEDRINE SULFATE 50 MG/ML IJ SOLN
INTRAMUSCULAR | Status: DC | PRN
  Administered 2016-10-21 (×5): 10 mg via INTRAVENOUS

## 2016-10-21 MED ORDER — SUCCINYLCHOLINE CHLORIDE 20 MG/ML IJ SOLN
INTRAMUSCULAR | Status: DC | PRN
  Administered 2016-10-21: 100 mg via INTRAVENOUS

## 2016-10-21 MED ORDER — ROCURONIUM BROMIDE 10 MG/ML (PF) SYRINGE
PREFILLED_SYRINGE | INTRAVENOUS | Status: AC
  Filled 2016-10-21: qty 5

## 2016-10-21 MED ORDER — HEMOSTATIC AGENTS (NO CHARGE) OPTIME
TOPICAL | Status: DC | PRN
  Administered 2016-10-21: 1 via TOPICAL

## 2016-10-21 MED ORDER — GLIPIZIDE 10 MG PO TABS: 10.0000 mg | ORAL_TABLET | Freq: Every evening | ORAL | Status: DC

## 2016-10-21 MED ORDER — OXYCODONE-ACETAMINOPHEN 5-325 MG PO TABS: 1.0000 | ORAL_TABLET | Freq: Four times a day (QID) | ORAL | 0 refills | Status: DC | PRN

## 2016-10-21 MED ORDER — SODIUM CHLORIDE 0.9 % IV SOLN
INTRAVENOUS | Status: DC
  Administered 2016-10-21 (×2): via INTRAVENOUS

## 2016-10-21 MED ORDER — PROPOFOL 10 MG/ML IV BOLUS
INTRAVENOUS | Status: DC | PRN
  Administered 2016-10-21: 150 mg via INTRAVENOUS
  Administered 2016-10-21 (×2): 20 mg via INTRAVENOUS
  Administered 2016-10-21: 40 mg via INTRAVENOUS
  Administered 2016-10-21: 50 mg via INTRAVENOUS

## 2016-10-21 MED ORDER — PHENYLEPHRINE 40 MCG/ML (10ML) SYRINGE FOR IV PUSH (FOR BLOOD PRESSURE SUPPORT)
PREFILLED_SYRINGE | INTRAVENOUS | Status: AC
  Filled 2016-10-21: qty 10

## 2016-10-21 MED ORDER — PHENYLEPHRINE HCL 10 MG/ML IJ SOLN
INTRAMUSCULAR | Status: DC | PRN
  Administered 2016-10-21: 80 ug via INTRAVENOUS

## 2016-10-21 MED ORDER — SUGAMMADEX SODIUM 200 MG/2ML IV SOLN
INTRAVENOUS | Status: AC
  Filled 2016-10-21: qty 2

## 2016-10-21 MED ORDER — LIDOCAINE-EPINEPHRINE (PF) 1 %-1:200000 IJ SOLN
INTRAMUSCULAR | Status: AC
  Filled 2016-10-21: qty 30

## 2016-10-21 MED ORDER — LIDOCAINE 2% (20 MG/ML) 5 ML SYRINGE
INTRAMUSCULAR | Status: AC
  Filled 2016-10-21: qty 5

## 2016-10-21 MED ORDER — SODIUM CHLORIDE 0.9 % IV SOLN
INTRAVENOUS | Status: DC | PRN
  Administered 2016-10-21: 11:00:00

## 2016-10-21 MED ORDER — LIDOCAINE-EPINEPHRINE (PF) 1 %-1:200000 IJ SOLN
INTRAMUSCULAR | Status: DC | PRN
Start: 2016-10-21 — End: 2016-10-21
  Administered 2016-10-21: 10 mL

## 2016-10-21 MED ORDER — DEXTROSE 5 % IV SOLN
1.5000 g | INTRAVENOUS | Status: AC
  Administered 2016-10-21: 1.5 g via INTRAVENOUS
  Filled 2016-10-21 (×2): qty 1.5

## 2016-10-21 MED ORDER — FENTANYL CITRATE (PF) 250 MCG/5ML IJ SOLN
INTRAMUSCULAR | Status: AC
  Filled 2016-10-21: qty 5

## 2016-10-21 MED ORDER — MIDAZOLAM HCL 2 MG/2ML IJ SOLN
INTRAMUSCULAR | Status: AC
  Filled 2016-10-21: qty 2

## 2016-10-21 MED ORDER — FENTANYL CITRATE (PF) 100 MCG/2ML IJ SOLN
INTRAMUSCULAR | Status: DC | PRN
  Administered 2016-10-21 (×3): 50 ug via INTRAVENOUS

## 2016-10-21 MED ORDER — EPHEDRINE 5 MG/ML INJ
INTRAVENOUS | Status: AC
  Filled 2016-10-21: qty 10

## 2016-10-21 MED ORDER — LIDOCAINE HCL (CARDIAC) 20 MG/ML IV SOLN
INTRAVENOUS | Status: DC | PRN
  Administered 2016-10-21: 60 mg via INTRATRACHEAL

## 2016-10-21 MED ORDER — INSULIN GLARGINE 100 UNIT/ML SOLOSTAR PEN: PEN_INJECTOR | SUBCUTANEOUS | 5 refills | Status: DC

## 2016-10-21 MED ORDER — SUCCINYLCHOLINE CHLORIDE 200 MG/10ML IV SOSY
PREFILLED_SYRINGE | INTRAVENOUS | Status: AC
  Filled 2016-10-21: qty 10

## 2016-10-21 MED ORDER — 0.9 % SODIUM CHLORIDE (POUR BTL) OPTIME
TOPICAL | Status: DC | PRN
  Administered 2016-10-21: 1000 mL

## 2016-10-21 SURGICAL SUPPLY — 41 items
ARMBAND PINK RESTRICT EXTREMIT (MISCELLANEOUS) ×3 IMPLANT
CANISTER SUCT 3000ML PPV (MISCELLANEOUS) ×3 IMPLANT
CLIP TI MEDIUM 24 (CLIP) ×3 IMPLANT
CLIP TI MEDIUM 6 (CLIP) IMPLANT
CLIP TI WIDE RED SMALL 24 (CLIP) ×3 IMPLANT
CLIP TI WIDE RED SMALL 6 (CLIP) IMPLANT
COVER PROBE W GEL 5X96 (DRAPES) IMPLANT
DERMABOND ADVANCED (GAUZE/BANDAGES/DRESSINGS) ×2
DERMABOND ADVANCED .7 DNX12 (GAUZE/BANDAGES/DRESSINGS) ×1 IMPLANT
ELECT REM PT RETURN 9FT ADLT (ELECTROSURGICAL) ×3
ELECTRODE REM PT RTRN 9FT ADLT (ELECTROSURGICAL) ×1 IMPLANT
GLOVE BIO SURGEON STRL SZ 6.5 (GLOVE) ×2 IMPLANT
GLOVE BIO SURGEONS STRL SZ 6.5 (GLOVE) ×1
GLOVE BIOGEL PI IND STRL 6.5 (GLOVE) ×2 IMPLANT
GLOVE BIOGEL PI IND STRL 7.0 (GLOVE) ×1 IMPLANT
GLOVE BIOGEL PI IND STRL 7.5 (GLOVE) ×1 IMPLANT
GLOVE BIOGEL PI INDICATOR 6.5 (GLOVE) ×4
GLOVE BIOGEL PI INDICATOR 7.0 (GLOVE) ×2
GLOVE BIOGEL PI INDICATOR 7.5 (GLOVE) ×2
GLOVE SURG SS PI 7.5 STRL IVOR (GLOVE) ×3 IMPLANT
GOWN STRL REUS W/ TWL LRG LVL3 (GOWN DISPOSABLE) ×4 IMPLANT
GOWN STRL REUS W/ TWL XL LVL3 (GOWN DISPOSABLE) ×2 IMPLANT
GOWN STRL REUS W/TWL LRG LVL3 (GOWN DISPOSABLE) ×8
GOWN STRL REUS W/TWL XL LVL3 (GOWN DISPOSABLE) ×4
HEMOSTAT SNOW SURGICEL 2X4 (HEMOSTASIS) IMPLANT
KIT BASIN OR (CUSTOM PROCEDURE TRAY) ×3 IMPLANT
KIT ROOM TURNOVER OR (KITS) ×3 IMPLANT
NS IRRIG 1000ML POUR BTL (IV SOLUTION) ×3 IMPLANT
PACK CV ACCESS (CUSTOM PROCEDURE TRAY) ×3 IMPLANT
PAD ARMBOARD 7.5X6 YLW CONV (MISCELLANEOUS) ×6 IMPLANT
POWDER SURGICEL 3.0 GRAM (HEMOSTASIS) ×3 IMPLANT
SPONGE LAP 18X18 X RAY DECT (DISPOSABLE) ×3 IMPLANT
SUT PROLENE 5 0 C 1 24 (SUTURE) ×6 IMPLANT
SUT PROLENE 6 0 CC (SUTURE) ×3 IMPLANT
SUT PROLENE 7 0 BV 1 (SUTURE) ×6 IMPLANT
SUT SILK 2 0 SH (SUTURE) ×3 IMPLANT
SUT VIC AB 3-0 SH 27 (SUTURE) ×4
SUT VIC AB 3-0 SH 27X BRD (SUTURE) ×2 IMPLANT
SUT VICRYL 4-0 PS2 18IN ABS (SUTURE) ×3 IMPLANT
UNDERPAD 30X30 (UNDERPADS AND DIAPERS) ×3 IMPLANT
WATER STERILE IRR 1000ML POUR (IV SOLUTION) ×3 IMPLANT

## 2016-10-21 NOTE — Anesthesia Procedure Notes (Addendum)
Procedure Name: Intubation Date/Time: 10/21/2016 9:52 AM Performed by: Scheryl Darter Pre-anesthesia Checklist: Patient identified, Emergency Drugs available, Suction available and Patient being monitored Patient Re-evaluated:Patient Re-evaluated prior to inductionOxygen Delivery Method: Circle System Utilized Preoxygenation: Pre-oxygenation with 100% oxygen Intubation Type: IV induction Ventilation: Mask ventilation without difficulty Laryngoscope Size: 2 Grade View: Grade I Tube type: Oral Tube size: 7.5 mm Number of attempts: 1 Airway Equipment and Method: Stylet Placement Confirmation: positive ETCO2,  ETT inserted through vocal cords under direct vision and breath sounds checked- equal and bilateral Secured at: 23 cm Tube secured with: Tape Dental Injury: Teeth and Oropharynx as per pre-operative assessment

## 2016-10-21 NOTE — Anesthesia Postprocedure Evaluation (Signed)
Anesthesia Post Note  Patient: Douglas Meyer  Procedure(s) Performed: Procedure(s) (LRB): LEFT ARM 2ND STAGE BASILIC VEIN TRANSPOSITION (Left)  Patient location during evaluation: PACU Anesthesia Type: General Level of consciousness: awake and alert Pain management: pain level controlled Vital Signs Assessment: post-procedure vital signs reviewed and stable Respiratory status: spontaneous breathing, nonlabored ventilation, respiratory function stable and patient connected to nasal cannula oxygen Cardiovascular status: blood pressure returned to baseline and stable Postop Assessment: no signs of nausea or vomiting Anesthetic complications: no       Last Vitals:  Vitals:   10/21/16 1204 10/21/16 1217  BP:  (!) 121/54  Pulse:  60  Resp:  16  Temp: 36.5 C     Last Pain:  Vitals:   10/21/16 0734  TempSrc: Oral                 Marijke Guadiana P Tae Robak

## 2016-10-21 NOTE — Interval H&P Note (Signed)
History and Physical Interval Note:  10/21/2016 8:50 AM  Douglas Meyer  has presented today for surgery, with the diagnosis of Stage 4 chronic kidney disease  The various methods of treatment have been discussed with the patient and family. After consideration of risks, benefits and other options for treatment, the patient has consented to  Procedure(s): LEFT ARM 2ND STAGE BASILIC VEIN TRANSPOSITION (Left) as a surgical intervention .  The patient's history has been reviewed, patient examined, no change in status, stable for surgery.  I have reviewed the patient's chart and labs.  Questions were answered to the patient's satisfaction.     [name]

## 2016-10-21 NOTE — H&P (View-Only) (Signed)
Patient name: Douglas Meyer MRN: [id number] DOB: 12-21-1940 Sex: male  REASON FOR VISIT:     post op  HISTORY OF PRESENT ILLNESS:   Douglas Meyer is a 76 y.o. male who returns today for follow-up.  He is status post first stage left basilic vein fistula on 42/59/5638.  Previously he had a failed right brachiocephalic fistula.  He reports no symptoms of steal  CURRENT MEDICATIONS:    Current Outpatient Prescriptions  Medication Sig Dispense Refill  . allopurinol (ZYLOPRIM) 300 MG tablet TAKE 1 BY MOUTH DAILY 90 tablet 1  . amLODipine (NORVASC) 5 MG tablet Take 1 tablet (5 mg total) by mouth daily. 90 tablet 3  . Ascorbic Acid (VITAMIN C) 500 MG tablet Take 500 mg by mouth daily.      Marland Kitchen aspirin 81 MG tablet Take 81 mg by mouth daily.    Marland Kitchen atorvastatin (LIPITOR) 20 MG tablet TAKE ONE TABLET BY MOUTH ONCE DAILY AT  6:00  PM (Patient taking differently: Take 20 mg by mouth daily at 6 PM. ) 90 tablet 3  . BD PEN NEEDLE NANO U/F 32G X 4 MM MISC USE AS DIRECTED ONCE DAILY 100 each 1  . calcitRIOL (ROCALTROL) 0.5 MCG capsule Take 0.5 mcg by mouth every other day.     . furosemide (LASIX) 80 MG tablet Take 1 tablet (80 mg total) by mouth 2 (two) times daily.    Marland Kitchen glipiZIDE (GLUCOTROL) 10 MG tablet Take 1 tablet (10 mg total) by mouth daily. (Patient taking differently: Take 10 mg by mouth every evening. ) 90 tablet 3  . Insulin Glargine (LANTUS SOLOSTAR) 100 UNIT/ML Solostar Pen Inject 37 Units into the skin at bedtime.    . Lancets (ACCU-CHEK MULTICLIX) lancets 1 each by Other route daily. Dx 250.01 102 each 3  . LANTUS SOLOSTAR 100 UNIT/ML Solostar Pen INJECT 41 UNITS SUBCUTANEOUSLY AT BEDTIME 3 pen 5  . metoprolol (TOPROL-XL) 200 MG 24 hr tablet TAKE ONE-HALF TABLET BY MOUTH EVERY DAY (Patient taking differently: Take 100 mg by mouth at bedtime. ) 180 tablet 0  . Multiple Vitamins-Minerals (MULTIVITAMIN WITH MINERALS) tablet Take 1 tablet by mouth daily.        . nitroGLYCERIN (NITROSTAT) 0.4 MG SL tablet Place 0.4 mg under the tongue every 5 (five) minutes as needed for chest pain.    . Omega-3 Fatty Acids (FISH OIL) 500 MG CAPS Take 500 mg by mouth daily.     . trandolapril (MAVIK) 4 MG tablet Take 2 tablets (8 mg total) by mouth at bedtime. NEEDS APPOINTMENT FOR FUTURE REFILLS    . trandolapril (MAVIK) 4 MG tablet TAKE 1 TABLET BY MOUTH TWICE DAILY **PATIENT NEEDS APPOINTMENT FOR FURTHER REFILLS** 60 tablet 0  . ACCU-CHEK AVIVA PLUS test strip USE ONE STRIP ONCE DAILY (Patient not taking: Reported on 10/10/2016) 100 each 3  . glucose blood (ACCU-CHEK AVIVA PLUS) test strip USE ONE STRIP ONCE DAILY (Patient not taking: Reported on 10/10/2016) 100 each 5   No current facility-administered medications for this visit.     REVIEW OF SYSTEMS:   [X]  denotes positive finding, [ ]  denotes negative finding Cardiac  Comments:  Chest pain or chest pressure:    Shortness of breath upon exertion:    Short of breath when lying flat:    Irregular heart rhythm:    Constitutional    Fever or chills:      PHYSICAL EXAM:   Vitals:   10/10/16 1528 10/10/16 1530  BP: Marland Kitchen)  155/67 (!) 153/67  Pulse: 93   Resp: 20   Temp: 97.6 F (36.4 C)   TempSrc: Oral   SpO2: 100%   Weight: 165 lb 12.8 oz (75.2 kg)   Height: 5\' 6"  (1.676 m)     GENERAL: The patient is a well-nourished male, in no acute distress. The vital signs are documented above. CARDIOVASCULAR: There is a regular rate and rhythm. PULMONARY: Non-labored respirations Palpable thrill within fistula  STUDIES:    I evaluated the patient was on a site.  It appears to be of excellent caliber.   MEDICAL ISSUES:    The patient will be scheduled for a left second stage basilic vein procedure on Friday, May 18.  There is a benefits of the procedure discussed the patient off) today  [name], MD [facility] [phone] Pager [phone]

## 2016-10-21 NOTE — Transfer of Care (Signed)
Immediate Anesthesia Transfer of Care Note  Patient: Douglas Meyer  Procedure(s) Performed: Procedure(s): LEFT ARM 2ND STAGE BASILIC VEIN TRANSPOSITION (Left)  Patient Location: PACU  Anesthesia Type:General  Level of Consciousness: awake, alert , oriented and sedated  Airway & Oxygen Therapy: Patient Spontanous Breathing and Patient connected to nasal cannula oxygen  Post-op Assessment: Report given to RN, Post -op Vital signs reviewed and stable and Patient moving all extremities  Post vital signs: Reviewed and stable  Last Vitals:  Vitals:   10/21/16 0734  BP: (!) 142/50  Pulse: 67  Resp: 20  Temp: 36.6 C    Last Pain:  Vitals:   10/21/16 0734  TempSrc: Oral         Complications: No apparent anesthesia complications

## 2016-10-21 NOTE — Anesthesia Preprocedure Evaluation (Addendum)
Anesthesia Evaluation  Patient identified by MRN, date of birth, ID band Patient awake    Reviewed: Allergy & Precautions, NPO status , Patient's Chart, lab work & pertinent test results  Airway Mallampati: II  TM Distance: >3 FB Neck ROM: Full    Dental no notable dental hx.    Pulmonary neg pulmonary ROS, former smoker,    Pulmonary exam normal breath sounds clear to auscultation       Cardiovascular Exercise Tolerance: Good hypertension, Pt. on medications + CAD, + Past MI and + Peripheral Vascular Disease  Normal cardiovascular exam Rhythm:Regular Rate:Normal  Sees Dr. Percival Spanish, Cardiology. ECG: SR ECHO:Left ventricle: The cavity size was normal. There was mild focal   basal hypertrophy of the septum. Systolic function was normal.   The estimated ejection fraction was in the range of 55% to 60%.   Wall motion was normal; there were no regional wall motion   abnormalities. Doppler parameters are consistent with abnormal   left ventricular relaxation (grade 1 diastolic dysfunction).   There was no evidence of elevated ventricular filling pressure by   Doppler parameters. - Aortic valve: Trileaflet; normal thickness leaflets. There was no   regurgitation. - Mitral valve: Structurally normal valve. There was no   regurgitation. - Left atrium: The atrium was moderately dilated. - Right ventricle: Systolic function was normal. - Right atrium: The atrium was normal in size. - Tricuspid valve: There was mild regurgitation. - Pulmonic valve: There was no regurgitation. - Pulmonary arteries: Systolic pressure was within the normal   range. - Inferior vena cava: The vessel was normal in size. - Pericardium, extracardiac: There was no pericardial effusion.   Neuro/Psych negative neurological ROS  negative psych ROS   GI/Hepatic negative GI ROS, Neg liver ROS,   Endo/Other  diabetes, Well Controlled, Insulin Dependent  Renal/GU ESRFRenal disease  negative genitourinary   Musculoskeletal negative musculoskeletal ROS (+)   Abdominal   Peds negative pediatric ROS (+)  Hematology negative hematology ROS (+)   Anesthesia Other Findings Hyperlipidemia  Reproductive/Obstetrics negative OB ROS                            Anesthesia Physical Anesthesia Plan  ASA: IV  Anesthesia Plan: General   Post-op Pain Management:    Induction: Intravenous  Airway Management Planned: LMA  Additional Equipment:   Intra-op Plan:   Post-operative Plan: Extubation in OR  Informed Consent: I have reviewed the patients History and Physical, chart, labs and discussed the procedure including the risks, benefits and alternatives for the proposed anesthesia with the patient or authorized representative who has indicated his/her understanding and acceptance.   Dental advisory given  Plan Discussed with: CRNA and Surgeon  Anesthesia Plan Comments:        Anesthesia Quick Evaluation

## 2016-10-22 ENCOUNTER — Encounter (HOSPITAL_COMMUNITY): Payer: Self-pay | Admitting: Surgery

## 2016-10-22 NOTE — Op Note (Signed)
    Patient name: Douglas Meyer MRN: [id number] DOB: 1940/09/09 Sex: male  10/21/2016 Pre-operative Diagnosis: CKD Post-operative diagnosis:  Same Surgeon:  [name] Assistants:  Silva Bandy Procedure:   2nd Stage Left basilic vein fistula Anesthesia:  General Blood Loss:  See anesthesia record Specimens:  none  Findings:  Excellent vein  Indications:  The patient is here for elevation of fistula  Procedure:  The patient was identified in the holding area and taken to [place] [facility]  The patient was then placed supine on the table. general anesthesia was administered.  The patient was prepped and draped in the usual sterile fashion.  A time out was called and antibiotics were administered.  2 longitudinal incisions were made over the basilic vein which was completely mobilized.  Side branches were ligated between silk ties.  Once full mobilization of the vein was complete, it was marked for orientation.  A curver tunneller was used to create a tunnel in the upper arm.  The vein was ligated near the arterial anastamosis.  It was then brought through the tunnel.  A end to end anastamosis was done with 5-0 prolene.  Once complete, there was bleeding from the tunnel which I could not identify.  I therefore, took down the anastamosis and brought the vein back out of the tunnel.  I identified a longitudinal tear along the vein which I closed with 7-0 prolene.  I tested the vein and there were no more leaks.  The vein was brought back through the tunnel and the anastamosis was re-done with 5-0 prolene.  There was an excellent thrill in the fistula.  No heparin was given.  There was excellent doppler signals at the wrist.  Once hemostasis was adequate, the incisions were closed in 2 layers of 3-0 vicryl followed by dermabond.   Disposition:  To PACU stable   V. [name], M.D. [facility] Office: [phone] Pager:  [phone]

## 2016-10-25 ENCOUNTER — Telehealth: Payer: Self-pay | Admitting: Surgery

## 2016-10-25 NOTE — Telephone Encounter (Signed)
Sched appt 11/21/16 at 2:15. Spoke to pt to confirm appt.

## 2016-10-25 NOTE — Telephone Encounter (Signed)
-----   Message from Mena Goes, RN sent at 10/21/2016  3:38 PM EDT ----- Regarding: 4-6 weeks   ----- Message ----- From: Alvia Grove, PA-C Sent: 10/21/2016  11:41 AM To: Vvs Charge Pool  S/p left 2nd stage BVT 10/21/16  F/u in 4-6 weeks with NP or VWB. No duplex  Thanks Maudie Mercury

## 2016-11-14 ENCOUNTER — Encounter: Payer: Self-pay | Admitting: Surgery

## 2016-11-20 ENCOUNTER — Other Ambulatory Visit: Payer: Self-pay | Admitting: Cardiology

## 2016-11-21 ENCOUNTER — Encounter: Payer: Self-pay | Admitting: Surgery

## 2016-11-21 ENCOUNTER — Ambulatory Visit (INDEPENDENT_AMBULATORY_CARE_PROVIDER_SITE_OTHER): Payer: Self-pay | Admitting: Surgery

## 2016-11-21 VITALS — BP 147/59 | HR 66 | Temp 97.7°F | Resp 16 | Ht 66.0 in | Wt 161.0 lb

## 2016-11-21 DIAGNOSIS — N184 Chronic kidney disease, stage 4 (severe): Secondary | ICD-10-CM

## 2016-11-21 NOTE — Progress Notes (Signed)
Patient name: Douglas Meyer MRN: [id number] DOB: 09/02/1940 Sex: male  REASON FOR VISIT:     post op  HISTORY OF PRESENT ILLNESS:   Douglas Meyer is a 76 y.o. male who returns today for follow-up.  He is status post second stage left basilic vein fistula on 40/98/1191.  He is not yet on dialysis.  He denies any symptoms of steal.  CURRENT MEDICATIONS:    Current Outpatient Prescriptions  Medication Sig Dispense Refill  . allopurinol (ZYLOPRIM) 300 MG tablet TAKE 1 BY MOUTH DAILY 90 tablet 1  . amLODipine (NORVASC) 5 MG tablet Take 1 tablet (5 mg total) by mouth daily. 90 tablet 3  . Ascorbic Acid (VITAMIN C) 500 MG tablet Take 500 mg by mouth at bedtime.     Marland Kitchen aspirin 81 MG tablet Take 81 mg by mouth at bedtime.     Marland Kitchen atorvastatin (LIPITOR) 20 MG tablet TAKE ONE TABLET BY MOUTH ONCE DAILY AT  6:00  PM 90 tablet 3  . BD PEN NEEDLE NANO U/F 32G X 4 MM MISC USE AS DIRECTED ONCE DAILY 100 each 1  . calcitRIOL (ROCALTROL) 0.5 MCG capsule Take 0.5 mcg by mouth every other day.     . furosemide (LASIX) 80 MG tablet Take 1 tablet (80 mg total) by mouth 2 (two) times daily.    Marland Kitchen glipiZIDE (GLUCOTROL) 10 MG tablet Take 1 tablet (10 mg total) by mouth every evening.    . hydrocortisone 2.5 % cream Apply 1 application topically every other day. Apply to scalp    . Insulin Glargine (LANTUS SOLOSTAR) 100 UNIT/ML Solostar Pen INJECT 26 UNITS SUBCUTANEOUSLY AT BEDTIME 3 pen 5  . Lancets (ACCU-CHEK MULTICLIX) lancets 1 each by Other route daily. Dx 250.01 102 each 3  . metoprolol (TOPROL-XL) 200 MG 24 hr tablet Take 0.5 tablets (100 mg total) by mouth at bedtime.    . Multiple Vitamins-Minerals (MULTIVITAMIN WITH MINERALS) tablet Take 1 tablet by mouth at bedtime.     . Omega-3 Fatty Acids (FISH OIL) 500 MG CAPS Take 500 mg by mouth at bedtime.     Marland Kitchen oxyCODONE-acetaminophen (PERCOCET/ROXICET) 5-325 MG tablet Take 1-2 tablets by mouth every 6 (six) hours as needed. 30  tablet 0  . trandolapril (MAVIK) 4 MG tablet Take 2 tablets (8 mg total) by mouth at bedtime. NEEDS APPOINTMENT FOR FUTURE REFILLS     No current facility-administered medications for this visit.     REVIEW OF SYSTEMS:   [X]  denotes positive finding, [ ]  denotes negative finding Cardiac  Comments:  Chest pain or chest pressure:    Shortness of breath upon exertion:    Short of breath when lying flat:    Irregular heart rhythm:    Constitutional    Fever or chills:      PHYSICAL EXAM:   Vitals:   11/21/16 1430  BP: (!) 147/59  Pulse: 66  Resp: 16  Temp: 97.7 F (36.5 C)  TempSrc: Oral  SpO2: 99%  Weight: 161 lb (73 kg)  Height: 5\' 6"  (1.676 m)    GENERAL: The patient is a well-nourished male, in no acute distress. The vital signs are documented above. CARDIOVASCULAR: There is a regular rate and rhythm. PULMONARY: Non-labored respirations Small stitch abscess in the proximal incision.  Excellent thrill within fistula  STUDIES:   The knot from the suture closure was removed.   MEDICAL ISSUES:   The patient's fistula is ready for use should he require dialysis.  He  will follow-up on an as-needed basis.  [name], MD [facility] [phone] Pager [phone]

## 2016-11-27 ENCOUNTER — Other Ambulatory Visit: Payer: Self-pay | Admitting: Cardiology

## 2016-11-28 NOTE — Telephone Encounter (Signed)
REFILL 

## 2016-11-29 ENCOUNTER — Other Ambulatory Visit: Payer: Self-pay | Admitting: *Deleted

## 2016-11-29 MED ORDER — TRANDOLAPRIL 4 MG PO TABS: 8.0000 mg | ORAL_TABLET | Freq: Every day | ORAL | 0 refills | Status: DC

## 2017-01-02 ENCOUNTER — Telehealth: Payer: Self-pay | Admitting: Cardiology

## 2017-01-02 DIAGNOSIS — I10 Essential (primary) hypertension: Secondary | ICD-10-CM

## 2017-01-02 MED ORDER — METOPROLOL SUCCINATE ER 200 MG PO TB24: 100.0000 mg | ORAL_TABLET | Freq: Every day | ORAL | 0 refills | Status: DC

## 2017-01-02 NOTE — Telephone Encounter (Signed)
New message    Pt is calling.    *STAT* If patient is at the pharmacy, call can be transferred to refill team.   1. Which medications need to be refilled? (please list name of each medication and dose if known) metoprolol 200 mg  2. Which pharmacy/location (including street and city if local pharmacy) is medication to be sent to? [facility] on [address]  3. Do they need a 30 day or 90 day supply? 30 day

## 2017-01-02 NOTE — Telephone Encounter (Signed)
OV was schedule for 09/14 @ 8:40 advised pt to keep appointment for future refills

## 2017-01-09 NOTE — Progress Notes (Signed)
Subjective:   Douglas Meyer is a 76 y.o. male who presents for Medicare Annual/Subsequent preventive examination.  The Patient was informed that the wellness visit is to identify future health risk and educate and initiate measures that can reduce risk for increased disease through the lifespan.    Annual Wellness Assessment  Reports health as good   Preventive Screening -Counseling & Management  Medicare Annual Preventive Care Visit - Subsequent Last OV 08/2016 with Douglas Meyer Hx DM type 1; Renal Insufficiency; shunt in left arm Douglas Meyer exam due but declined today Diabetic Eye exam 08/2016 - no retinopathy PSA 12/2014 1.15  Has an issue with hearing Wife states he was on a flight in April Feels he has wax in his ears and made apt for today. States words are "mubbled" and this is getting worse Agrees to see Douglas Meyer today  C/o of low bs in the am / 62 and one morning 40 Taking 25 units of lantus Will fup with Douglas Meyer today for changes.  Describes Health as poor, fair, good or great? Good Does have a shunt in the left   VS reviewed;   11/21/16 10/10/16 08/23/16   BP 147/59 153/67 [recheck]    Diet  Has not changed his diet in years He takes his meds regularly   BMI 25 Always eat 3 meals Breakfast cereal, muffins, bagels Lunch; egg sandwich; salads  Supper; couple of steaks; pork chops, eat lot of chicken   ETOH only beer  2 beers per day Smoked 6 years    Exercise currently working moving furniture  Always working around the home  Dental - hasn't been in [employer].   Stressors: just had shunt in left arm  Doing well; dialysis not started  Falls no   Sleep patterns: good   Pain - no     Advanced Directives Completed   Patient Care Team: Douglas Cookey, MD as PCP - General (Family Medicine) Meyer, Douglas Rinks, MD as Referring Physician (Nephrology) Minus Breeding, MD as Consulting Physician (Cardiology) Assessed for additional  providers  Immunization History  Administered Date(s) Administered  . Influenza Whole 06/06/2004, 03/07/2007, 03/06/2009, 04/12/2010  . Influenza-Unspecified 02/04/2013, 01/18/2014, 05/08/2015  . Pneumococcal Conjugate-13 12/17/2014  . Pneumococcal Polysaccharide-23 06/06/2000, 02/01/2007  . Td 06/06/2000, 06/28/2010  . Zoster 07/21/2008   Required Immunizations needed today  Screening test up to date or reviewed for plan of completion Health Maintenance Due  Topic Date Due  . INFLUENZA VACCINE  01/04/2017     Declines foot exam Basket made to [facility] and nurse practitioner to okay referral to podiatry.   Cardiac Risk Factors include: advanced age (>28men, >21 women);diabetes mellitus;dyslipidemia;family history of premature cardiovascular disease;hypertension;male gender;microalbuminuria     Objective:    Vitals: BP 140/60   Pulse 76   Ht 5\' 6"  (1.676 m)   Wt 160 lb (72.6 kg)   SpO2 98%   BMI 25.82 kg/m   Body mass index is 25.82 kg/m.  Tobacco History  Smoking Status  . Former Smoker  . Years: 6.00  . Quit date: 06/06/1973  Smokeless Tobacco  . Never Used     Counseling given: Yes   Past Medical History:  Diagnosis Date  . Bilateral carotid artery disease ([place])   . CAD (coronary artery disease) 05/15/10   out of hospital myocardial infarction in the 1990's. this was picked up on an  EKG in 1997.  Cardiac catherterization demonstrated an occluded vessel and collateras.  I have no  description of this.  A stress  perfusion study  done 2008 demonstrated an ejection fraction  of  53% with inferolateral  hypokinesis.  There was an inferolateral defect with  scar and some mild mixed ischemia.   managed rx  . Cancer ([place])    skin  . CKD (chronic kidney disease), stage IV ([place])   . Diabetes mellitus    DM, type II  . ED (erectile dysfunction)   . Gout   . History of kidney stones   . Hx of cardiovascular stress test    a. Lexiscan myoview 10/18/12: EF 53%, inf and  inf-lat scar, slight peri-infarct ischemia, small area of apical ischemia; no change from 2006  . Hyperlipidemia   . Hypertension   . Myocardial infarction ([place])   . Nephrolithiasis   . Polyp of colon   . PVD (peripheral vascular disease) ([place])    Past Surgical History:  Procedure Laterality Date  . AV FISTULA PLACEMENT Right 06/19/2015   Procedure: ARTERIOVENOUS (AV) FISTULA CREATION;  Surgeon: Serafina Mitchell, MD;  Location: [facility];  Service: Vascular;  Laterality: Right;  . BASCILIC VEIN TRANSPOSITION Left 08/11/2016   Procedure: BASCILIC VEIN TRANSPOSITION-LEFT 1ST STAGE;  Surgeon: Serafina Mitchell, MD;  Location: [facility];  Service: Vascular;  Laterality: Left;  . BASCILIC VEIN TRANSPOSITION Left 10/21/2016   Procedure: LEFT ARM 2ND STAGE BASILIC VEIN TRANSPOSITION;  Surgeon: Serafina Mitchell, MD;  Location: [facility] OR;  Service: Vascular;  Laterality: Left;  . COLONOSCOPY W/ POLYPECTOMY    . CYSTOSCOPY     stone removal x 1  . ENDARTERECTOMY Right 11/28/2014   Procedure: RIGHT CAROTID ENDARTERECTOMY;  Surgeon: Serafina Mitchell, MD;  Location: [facility];  Service: Vascular;  Laterality: Right;  . EYE SURGERY  06/2013   bilateral cataract surgery  . HUMERUS FRACTURE SURGERY     left  . MOHS SURGERY     multiple  . PATCH ANGIOPLASTY Right 11/28/2014   Procedure: PATCH ANGIOPLASTY RIGHT CAROTID;  Surgeon: Serafina Mitchell, MD;  Location: [facility] OR;  Service: Vascular;  Laterality: Right;   Family History  Problem Relation Age of Onset  . Heart attack Father        deceased 5  . Diabetes Brother        rheumatic fever, peptic ulcer disease, deceased  . Diabetes Sister        deceased 44  . Heart attack Mother        deceased age 79s  . Diabetes type II Brother   . Diabetes type II Brother    History  Sexual Activity  . Sexual activity: Not on file    Outpatient Encounter Prescriptions as of 01/10/2017  Medication Sig  . allopurinol (ZYLOPRIM) 300 MG tablet TAKE 1 BY MOUTH DAILY  . amLODipine  (NORVASC) 5 MG tablet Take 1 tablet (5 mg total) by mouth daily.  . Ascorbic Acid (VITAMIN C) 500 MG tablet Take 500 mg by mouth at bedtime.   Marland Kitchen aspirin 81 MG tablet Take 81 mg by mouth at bedtime.   Marland Kitchen atorvastatin (LIPITOR) 20 MG tablet TAKE ONE TABLET BY MOUTH ONCE DAILY AT  6:00  PM  . BD PEN NEEDLE NANO U/F 32G X 4 MM MISC USE AS DIRECTED ONCE DAILY  . calcitRIOL (ROCALTROL) 0.5 MCG capsule Take 0.5 mcg by mouth every other day.   . furosemide (LASIX) 80 MG tablet Take 1 tablet (80 mg total) by mouth 2 (two) times daily.  Marland Kitchen  glipiZIDE (GLUCOTROL) 10 MG tablet Take 1 tablet (10 mg total) by mouth every evening.  . hydrocortisone 2.5 % cream Apply 1 application topically every other day. Apply to scalp  . Insulin Glargine (LANTUS SOLOSTAR) 100 UNIT/ML Solostar Pen INJECT 26 UNITS SUBCUTANEOUSLY AT BEDTIME  . Lancets (ACCU-CHEK MULTICLIX) lancets 1 each by Other route daily. Dx 250.01  . metoprolol (TOPROL-XL) 200 MG 24 hr tablet Take 0.5 tablets (100 mg total) by mouth at bedtime. Keep appointment on 09/14 for further refill  . Multiple Vitamins-Minerals (MULTIVITAMIN WITH MINERALS) tablet Take 1 tablet by mouth at bedtime.   . Omega-3 Fatty Acids (FISH OIL) 500 MG CAPS Take 500 mg by mouth at bedtime.   . trandolapril (MAVIK) 4 MG tablet Take 2 tablets (8 mg total) by mouth at bedtime. NEED OV.  . oxyCODONE-acetaminophen (PERCOCET/ROXICET) 5-325 MG tablet Take 1-2 tablets by mouth every 6 (six) hours as needed. (Patient not taking: Reported on 01/10/2017)   No facility-administered encounter medications on file as of 01/10/2017.     Activities of Daily Living In your present state of health, do you have any difficulty performing the following activities: 01/10/2017 08/11/2016  Hearing? Y N  Vision? N N  Difficulty concentrating or making decisions? N N  Walking or climbing stairs? N N  Dressing or bathing? N N  Doing errands, shopping? N -  Preparing Food and eating ? N -  Using the Toilet? N  -  In the past six months, have you accidently leaked urine? N -  Do you have problems with loss of bowel control? N -  Managing your Medications? Y -  Comment insulin -  Managing your Finances? N -  Housekeeping or managing your Housekeeping? N -  Some recent data might be hidden    Patient Care Team: Douglas Cookey, MD as PCP - General (Family Medicine) Meyer, Douglas Rinks, MD as Referring Physician (Nephrology) Minus Breeding, MD as Consulting Physician (Cardiology)   Assessment:    Exercise Activities and Dietary recommendations Current Exercise Habits: Home exercise routine, Intensity: Moderate  Goals    . blood sugar          Recent weight loss and low sugars in the am Will fup with your doctor      . Exercise 150 minutes per week (moderate activity)    . Increase lean proteins      Fall Risk Fall Risk  01/10/2017 04/19/2016 12/17/2014 09/26/2013  Falls in the past year? No No No No   Depression Screen PHQ 2/9 Scores 01/10/2017 04/19/2016 12/17/2014 09/26/2013  PHQ - 2 Score 0 0 0 0    Cognitive Function   Ad8 score reviewed for issues:  Issues making decisions:  Less interest in hobbies / activities:  Repeats questions, stories (family complaining):  Trouble using ordinary gadgets (microwave, computer, phone):  Forgets the month or year:   Mismanaging finances:   Remembering appts:  Daily problems with thinking and/or memory: Ad8 score is=0       Immunization History  Administered Date(s) Administered  . Influenza Whole 06/06/2004, 03/07/2007, 03/06/2009, 04/12/2010  . Influenza-Unspecified 02/04/2013, 01/18/2014, 05/08/2015  . Pneumococcal Conjugate-13 12/17/2014  . Pneumococcal Polysaccharide-23 06/06/2000, 02/01/2007  . Td 06/06/2000, 06/28/2010  . Zoster 07/21/2008   Screening Tests Health Maintenance  Topic Date Due  . INFLUENZA VACCINE  01/04/2017  . FOOT EXAM  01/09/2018 (Originally 02/19/2015)  . HEMOGLOBIN A1C  02/11/2017  .  OPHTHALMOLOGY EXAM  08/16/2017  . TETANUS/TDAP  06/28/2020  .  PNA vac Low Risk Adult  Completed      Plan:     PCP Notes   Health Maintenance Has had zoster in the past but does not remember taking this. Educated on the shingrix.  Complaining of difficulty hearing in the last couple of months. States he has wax buildup in his ears often. Agreed to be checked today and is seeing Douglas Meyer.  Decline the foot assessment today. States his toenails are long today and thick.  Agrees to a podiatry referral. Basket and sent to [employer] to evaluate.  Mentioned AAA due to remote history of smoking. Mr. Panetta does not want a follow-up at this time will discuss with his doctor at the next visit.   Agrees to take flu vaccine when available   Abnormal Screens  Blood pressure is slightly elevated at 549 systolic today.   Referrals  Pending referral to podiatry.  Patient concerns; The patient noted his blood sugars in the a.m. are running below 100. States a couple of times, blood sugars have been 40-60 range. Has only been over 1002 in the last month. Basket and sent to [facility] Nurse practitioner for advisement  Nurse Concerns; The patient states he feels good. Followed by nephrology.  The patient is a good historian. Takes all of his medicine as prescribed. Does drink one to 2 beers per day. States the nephrologist is aware of his alcohol intake.  Next PCP apt To see Douglas Meyer today for possible wax in his ear or recent hearing loss   I have personally reviewed and noted the following in the patient's chart:   . Medical and social history . Use of alcohol, tobacco or illicit drugs  . Current medications and supplements . Functional ability and status . Nutritional status . Physical activity . Advanced directives . List of other physicians . Hospitalizations, surgeries, and ER visits in previous 12 months . Vitals . Screenings to include cognitive, depression,  and falls . Referrals and appointments  In addition, I have reviewed and discussed with patient certain preventive protocols, quality metrics, and best practice recommendations. A written personalized care plan for preventive services as well as general preventive health recommendations were provided to patient.     Wynetta Fines, RN  01/10/2017

## 2017-01-10 ENCOUNTER — Ambulatory Visit (INDEPENDENT_AMBULATORY_CARE_PROVIDER_SITE_OTHER): Payer: Medicare Other | Admitting: Family Medicine

## 2017-01-10 ENCOUNTER — Telehealth: Payer: Self-pay

## 2017-01-10 ENCOUNTER — Encounter: Payer: Self-pay | Admitting: Family Medicine

## 2017-01-10 ENCOUNTER — Ambulatory Visit (INDEPENDENT_AMBULATORY_CARE_PROVIDER_SITE_OTHER): Payer: Medicare Other

## 2017-01-10 VITALS — BP 140/60 | HR 76 | Ht 66.0 in | Wt 160.0 lb

## 2017-01-10 VITALS — BP 142/92 | HR 70 | Resp 12

## 2017-01-10 DIAGNOSIS — H903 Sensorineural hearing loss, bilateral: Secondary | ICD-10-CM | POA: Insufficient documentation

## 2017-01-10 DIAGNOSIS — Z0000 Encounter for general adult medical examination without abnormal findings: Secondary | ICD-10-CM

## 2017-01-10 NOTE — Progress Notes (Signed)
   Subjective:    Patient ID: Douglas Meyer, male    DOB: 04/21/1941, 76 y.o.   MRN: [id number]  HPI Here to check his ears. Over the past few months he has noticed a progressive loss of hearing in both ears. No ear pain or pressure. E as trouble making out conversations of the TV is on, for example.    Review of Systems  Constitutional: Negative.   HENT: Positive for hearing loss. Negative for congestion, ear discharge, ear pain, postnasal drip, sinus pain and sinus pressure.   Eyes: Negative.   Neurological: Negative.        Objective:   Physical Exam  Constitutional: He appears well-developed and well-nourished.  HENT:  Right Ear: External ear normal.  Left Ear: External ear normal.  Nose: Nose normal.  Mouth/Throat: Oropharynx is clear and moist.  Eyes: Conjunctivae are normal.  Neck: No thyromegaly present.  Cardiovascular: Normal rate, regular rhythm, normal heart sounds and intact distal pulses.   Pulmonary/Chest: Effort normal and breath sounds normal. No respiratory distress. He has no wheezes. He has no rales.  Lymphadenopathy:    He has no cervical adenopathy.          Assessment & Plan:  Bilateral hearing loss, likely sensorineural. I advised him to have a full audiologic evaluation done, and he said he would see the hearing aid center at [facility] where he is a member.  Alysia Penna, MD

## 2017-01-10 NOTE — Telephone Encounter (Addendum)
Patient in for AWV Taking lantus 25 unit q am States he is eating well, but has lost approx 10 lb.   Has had reading in the past month of BS running < 60  More so recently, Was 40 one time and took OJ  Shunt changed to the left arm May 18, no dialysis as yet. States he feels good. Managing bs with Orange juice   NEW ISSUES Mr Viana declined his diabetic foot exam but states he toenails are thick and Dr. Sherren Mocha used to cut them. States they are long now. Is it ok to refer to podiatry?   Please advise

## 2017-01-10 NOTE — Telephone Encounter (Signed)
Have him decrease his Lantus to 20 units at night.   It is ok to refer him to podiatry  Thank you for doing that

## 2017-01-10 NOTE — Patient Instructions (Addendum)
Douglas Meyer , Thank you for taking time to come for your Medicare Wellness Visit. I appreciate your ongoing commitment to your health goals. Please review the following plan we discussed and let me know if I can assist you in the future.   Medicare has a screen for male's who have smoked over 100 cigarettes in their lifetime. You can discuss with the doctor if this would benefit you.   Agreed to ambulatory referral to a podiatrist; will ok per [name]   Will ask [name] about a podiatry referral due to thick toenails and being diabetic  To see Dr. Sarajane Jews today to check ears   Shingrix is a vaccine for the prevention of Shingles in Adults 7 and older.  If you are on Medicare, you can request a prescription from your doctor to be filled at a pharmacy.  Please check with your benefits regarding applicable copays or out of pocket expenses.  The Shingrix is given in 2 vaccines approx 8 weeks apart. You must receive the 2nd dose prior to 6 months from receipt of the first.   Will take flu vaccine when available    Prevention of falls: Remove rugs or any tripping hazards in the home Use Non slip mats in bathtubs and showers Placing grab bars next to the toilet and or shower Placing handrails on both sides of the stair way Adding extra lighting in the home.   Personal safety issues reviewed:  1. Consider starting a community watch program per [facility] [employer] [facility] 2.  Changes batteries is smoke detector and/or carbon monoxide detector  3.  If you have firearms; keep them in a safe place 4.  Wear protection when in the sun; Always wear sunscreen or a hat; It is good to have your doctor check your skin annually or review any new areas of concern 5. Driving safety; Keep in the right lane; stay 3 car lengths behind the car in front of you on the highway; look 3 times prior to pulling out; carry your cell phone everywhere you go!    Learn about the Yellow Dot program:  The program allows first  responders at your emergency to have access to who your physician is, as well as your medications and medical conditions.  Citizens requesting the Yellow Dot Packages should contact Master Corporal Nunzio Cobbs at the [facility] [phone] for the first week of the program and beginning the week after Easter citizens should contact their Scientist, physiological.      These are the goals we discussed: Goals    . Exercise 150 minutes per week (moderate activity)    . Increase lean proteins       This is a list of the screening recommended for you and due dates:  Health Maintenance  Topic Date Due  . Complete foot exam   02/19/2015  . Flu Shot  01/04/2017  . Hemoglobin A1C  02/11/2017  . Eye exam for diabetics  08/16/2017  . Tetanus Vaccine  06/28/2020  . Pneumonia vaccines  Completed    Health Maintenance, Male A healthy lifestyle and preventive care is important for your health and wellness. Ask your health care provider about what schedule of regular examinations is right for you. What should I know about weight and diet? Eat a Healthy Diet  Eat plenty of vegetables, fruits, whole grains, low-fat dairy products, and lean protein.  Do not eat a lot of foods high in solid fats, added sugars, or salt.  Maintain a Healthy Weight Regular exercise can help you achieve or maintain a healthy weight. You should:  Do at least 150 minutes of exercise each week. The exercise should increase your heart rate and make you sweat (moderate-intensity exercise).  Do strength-training exercises at least twice a week.  Watch Your Levels of Cholesterol and Blood Lipids  Have your blood tested for lipids and cholesterol every 5 years starting at 77 years of age. If you are at high risk for heart disease, you should start having your blood tested when you are 77 years old. You may need to have your cholesterol levels checked more often if: ? Your lipid or cholesterol  levels are high. ? You are older than 77 years of age. ? You are at high risk for heart disease.  What should I know about cancer screening? Many types of cancers can be detected early and may often be prevented. Lung Cancer  You should be screened every year for lung cancer if: ? You are a current smoker who has smoked for at least 30 years. ? You are a former smoker who has quit within the past 15 years.  Talk to your health care provider about your screening options, when you should start screening, and how often you should be screened.  Colorectal Cancer  Routine colorectal cancer screening usually begins at 77 years of age and should be repeated every 5-10 years until you are 77 years old. You may need to be screened more often if early forms of precancerous polyps or small growths are found. Your health care provider may recommend screening at an earlier age if you have risk factors for colon cancer.  Your health care provider may recommend using home test kits to check for hidden blood in the stool.  A small camera at the end of a tube can be used to examine your colon (sigmoidoscopy or colonoscopy). This checks for the earliest forms of colorectal cancer.  Prostate and Testicular Cancer  Depending on your age and overall health, your health care provider may do certain tests to screen for prostate and testicular cancer.  Talk to your health care provider about any symptoms or concerns you have about testicular or prostate cancer.  Skin Cancer  Check your skin from head to toe regularly.  Tell your health care provider about any new moles or changes in moles, especially if: ? There is a change in a mole's size, shape, or color. ? You have a mole that is larger than a pencil eraser.  Always use sunscreen. Apply sunscreen liberally and repeat throughout the day.  Protect yourself by wearing long sleeves, pants, a wide-brimmed hat, and sunglasses when outside.  What should  I know about heart disease, diabetes, and high blood pressure?  If you are 35-47 years of age, have your blood pressure checked every 3-5 years. If you are 52 years of age or older, have your blood pressure checked every year. You should have your blood pressure measured twice-once when you are at a hospital or clinic, and once when you are not at a hospital or clinic. Record the average of the two measurements. To check your blood pressure when you are not at a hospital or clinic, you can use: ? An automated blood pressure machine at a pharmacy. ? A home blood pressure monitor.  Talk to your health care provider about your target blood pressure.  If you are between 34-38 years old, ask your health care provider if  you should take aspirin to prevent heart disease.  Have regular diabetes screenings by checking your fasting blood sugar level. ? If you are at a normal weight and have a low risk for diabetes, have this test once every three years after the age of 46. ? If you are overweight and have a high risk for diabetes, consider being tested at a younger age or more often.  A one-time screening for abdominal aortic aneurysm (AAA) by ultrasound is recommended for men aged 47-75 years who are current or former smokers. What should I know about preventing infection? Hepatitis B If you have a higher risk for hepatitis B, you should be screened for this virus. Talk with your health care provider to find out if you are at risk for hepatitis B infection. Hepatitis C Blood testing is recommended for:  Everyone born from [birth year] through 1966.  Anyone with known risk factors for hepatitis C.  Sexually Transmitted Diseases (STDs)  You should be screened each year for STDs including gonorrhea and chlamydia if: ? You are sexually active and are younger than 77 years of age. ? You are older than 77 years of age and your health care provider tells you that you are at risk for this type of  infection. ? Your sexual activity has changed since you were last screened and you are at an increased risk for chlamydia or gonorrhea. Ask your health care provider if you are at risk.  Talk with your health care provider about whether you are at high risk of being infected with HIV. Your health care provider may recommend a prescription medicine to help prevent HIV infection.  What else can I do?  Schedule regular health, dental, and eye exams.  Stay current with your vaccines (immunizations).  Do not use any tobacco products, such as cigarettes, chewing tobacco, and e-cigarettes. If you need help quitting, ask your health care provider.  Limit alcohol intake to no more than 2 drinks per day. One drink equals 12 ounces of beer, 5 ounces of wine, or 1 ounces of hard liquor.  Do not use street drugs.  Do not share needles.  Ask your health care provider for help if you need support or information about quitting drugs.  Tell your health care provider if you often feel depressed.  Tell your health care provider if you have ever been abused or do not feel safe at home. This information is not intended to replace advice given to you by your health care provider. Make sure you discuss any questions you have with your health care provider. Document Released: 11/19/2007 Document Revised: 01/20/2016 Document Reviewed: 02/24/2015 Elsevier Interactive Patient Education  [employer].

## 2017-01-10 NOTE — Progress Notes (Signed)
I have reviewed and agree with this plan  

## 2017-01-11 ENCOUNTER — Other Ambulatory Visit: Payer: Self-pay

## 2017-01-11 DIAGNOSIS — E109 Type 1 diabetes mellitus without complications: Secondary | ICD-10-CM

## 2017-01-11 NOTE — Telephone Encounter (Signed)
Call to ms. Carrigg,  Instructed to decrease Lantus to 20u at hs Will also refer to podiatry and she should get an apt.

## 2017-01-30 ENCOUNTER — Ambulatory Visit: Payer: Medicare Other | Admitting: Podiatry

## 2017-02-13 ENCOUNTER — Ambulatory Visit: Payer: Medicare Other | Admitting: Podiatry

## 2017-02-16 NOTE — Progress Notes (Signed)
HPI The patient presents for follow up of CAD.  I have not seen him in two years.  He had a previous out of hospital myocardial infarction. His last stress test was 90 with old inferior infarct with an EF of 53%. Previous cardiac catheterization was reported to describe an occluded chronic vessel with collaterals but I never had a copy of this. He has had CEA.    He has had two fistulas placed for dialysis but one failed.  He has not yet needed dialysis.  The patient denies any new symptoms such as chest discomfort, neck or arm discomfort. There has been no new shortness of breath, PND or orthopnea. There have been no reported palpitations, presyncope or syncope.  He is active with [facility].  He does work with them.    No Known Allergies  Current Outpatient Prescriptions  Medication Sig Dispense Refill  . allopurinol (ZYLOPRIM) 300 MG tablet TAKE 1 BY MOUTH DAILY 90 tablet 1  . amLODipine (NORVASC) 5 MG tablet Take 1 tablet (5 mg total) by mouth daily. 90 tablet 3  . Ascorbic Acid (VITAMIN C) 500 MG tablet Take 500 mg by mouth at bedtime.     Marland Kitchen aspirin 81 MG tablet Take 81 mg by mouth at bedtime.     Marland Kitchen atorvastatin (LIPITOR) 20 MG tablet TAKE ONE TABLET BY MOUTH ONCE DAILY AT  6:00  PM 90 tablet 3  . BD PEN NEEDLE NANO U/F 32G X 4 MM MISC USE AS DIRECTED ONCE DAILY 100 each 1  . calcitRIOL (ROCALTROL) 0.5 MCG capsule Take 1 capsule (0.5 mcg total) by mouth every other day. 30 capsule 0  . furosemide (LASIX) 80 MG tablet Take 1 tablet (80 mg total) by mouth 2 (two) times daily.    Marland Kitchen glipiZIDE (GLUCOTROL) 10 MG tablet Take 1 tablet (10 mg total) by mouth every evening.    . hydrocortisone 2.5 % cream Apply 1 application topically every other day. Apply to scalp    . Insulin Glargine (LANTUS SOLOSTAR) 100 UNIT/ML Solostar Pen INJECT 26 UNITS SUBCUTANEOUSLY AT BEDTIME 3 pen 5  . Lancets (ACCU-CHEK MULTICLIX) lancets 1 each by Other route daily. Dx 250.01 102 each 3  . metoprolol  (TOPROL-XL) 200 MG 24 hr tablet Take 0.5 tablets (100 mg total) by mouth at bedtime. Keep appointment on 09/14 for further refill 60 tablet 0  . Multiple Vitamins-Minerals (MULTIVITAMIN WITH MINERALS) tablet Take 1 tablet by mouth at bedtime.     . Omega-3 Fatty Acids (FISH OIL) 500 MG CAPS Take 500 mg by mouth at bedtime.     . trandolapril (MAVIK) 4 MG tablet Take 2 tablets (8 mg total) by mouth at bedtime. NEED OV. 180 tablet 0   No current facility-administered medications for this visit.     Past Medical History:  Diagnosis Date  . Bilateral carotid artery disease ([place])   . CAD (coronary artery disease) 05/15/10   out of hospital myocardial infarction in the 1990's. this was picked up on an  EKG in 1997.  Cardiac catherterization demonstrated an occluded vessel and collateras.  I have no description of this.  A stress  perfusion study  done 2008 demonstrated an ejection fraction  of  53% with inferolateral  hypokinesis.  There was an inferolateral defect with  scar and some mild mixed ischemia.   managed rx  . Cancer ([place])    skin  . CKD (chronic kidney disease), stage IV ([place])   . Diabetes  mellitus    DM, type II  . ED (erectile dysfunction)   . Gout   . History of kidney stones   . Hx of cardiovascular stress test    a. Lexiscan myoview 10/18/12: EF 53%, inf and inf-lat scar, slight peri-infarct ischemia, small area of apical ischemia; no change from 2006  . Hyperlipidemia   . Hypertension   . Myocardial infarction ([place])   . Nephrolithiasis   . Polyp of colon   . PVD (peripheral vascular disease) ([place])     Past Surgical History:  Procedure Laterality Date  . AV FISTULA PLACEMENT Right 06/19/2015   Procedure: ARTERIOVENOUS (AV) FISTULA CREATION;  Surgeon: Serafina Mitchell, MD;  Location: [place];  Service: Vascular;  Laterality: Right;  . BASCILIC VEIN TRANSPOSITION Left 08/11/2016   Procedure: BASCILIC VEIN TRANSPOSITION-LEFT 1ST STAGE;  Surgeon: Serafina Mitchell, MD;  Location: [place];  Service: Vascular;  Laterality: Left;  . BASCILIC VEIN TRANSPOSITION Left 10/21/2016   Procedure: LEFT ARM 2ND STAGE BASILIC VEIN TRANSPOSITION;  Surgeon: Serafina Mitchell, MD;  [facility];  Service: Vascular;  Laterality: Left;  . COLONOSCOPY W/ POLYPECTOMY    . CYSTOSCOPY     stone removal x 1  . ENDARTERECTOMY Right 11/28/2014   Procedure: RIGHT CAROTID ENDARTERECTOMY;  Surgeon: Serafina Mitchell, MD;  Location: [place];  Service: Vascular;  Laterality: Right;  . EYE SURGERY  06/2013   bilateral cataract surgery  . HUMERUS FRACTURE SURGERY     left  . MOHS SURGERY     multiple  . PATCH ANGIOPLASTY Right 11/28/2014   Procedure: PATCH ANGIOPLASTY RIGHT CAROTID;  Surgeon: Serafina Mitchell, MD;  Location[facility];  Service: Vascular;  Laterality: Right;    ROS:  As stated in the HPI and negative for all other systems.  PHYSICAL EXAM BP 132/66   Pulse 76   Ht 5\' 6"  (1.676 m)   Wt 162 lb (73.5 kg)   BMI 26.15 kg/m   GENERAL:  Well appearing NECK:  No jugular venous distention, waveform within normal limits, carotid upstroke brisk and symmetric, no bruits, no thyromegaly LUNGS:  Clear to auscultation bilaterally CHEST:  Unremarkable HEART:  PMI not displaced or sustained,S1 and S2 within normal limits, no S3, no S4, no clicks, no rubs, no murmurs ABD:  Flat, positive bowel sounds normal in frequency in pitch, no bruits, no rebound, no guarding, no midline pulsatile mass, no hepatomegaly, no splenomegaly EXT:  2 plus pulses throughout, no edema, no cyanosis no clubbing, clotted right arm dialysis fistula, left arm dialysis fistula with bruit and thrill   EKG:  Sinus rhythm, rate 60, old inferior infarct, no acute ST-T wave changes first degree AV block .  04/19/16  Lab Results  Component Value Date   CHOL 131 04/19/2016   TRIG 223.0 (H) 04/19/2016   HDL 34.20 (L) 04/19/2016   LDLCALC 41 09/19/2013   LDLDIRECT 55.0 04/19/2016   Lab Results  Component Value Date   HGBA1C 6.7  (H) 08/11/2016     ASSESSMENT AND PLAN  CAD:   The patient has no new sypmtoms.  No further cardiovascular testing is indicated.  We will continue with aggressive risk reduction and meds as listed.  DYSLIPIDEMIA:  His LDL is good as above.  No change in therapy.    CKD:  This is followed by Dr. Jimmy Footman.   HTN:  The blood pressure is at target. No change in medications is indicated. We will continue with  therapeutic lifestyle changes (TLC).  DM:  A1C was 6.7.  No change in therapy.

## 2017-02-17 ENCOUNTER — Ambulatory Visit (INDEPENDENT_AMBULATORY_CARE_PROVIDER_SITE_OTHER): Payer: Medicare Other | Admitting: Cardiology

## 2017-02-17 VITALS — BP 132/66 | HR 76 | Ht 66.0 in | Wt 162.0 lb

## 2017-02-17 DIAGNOSIS — I10 Essential (primary) hypertension: Secondary | ICD-10-CM | POA: Diagnosis not present

## 2017-02-17 DIAGNOSIS — E785 Hyperlipidemia, unspecified: Secondary | ICD-10-CM

## 2017-02-17 DIAGNOSIS — N185 Chronic kidney disease, stage 5: Secondary | ICD-10-CM | POA: Diagnosis not present

## 2017-02-17 DIAGNOSIS — I2510 Atherosclerotic heart disease of native coronary artery without angina pectoris: Secondary | ICD-10-CM | POA: Diagnosis not present

## 2017-02-17 MED ORDER — CALCITRIOL 0.5 MCG PO CAPS: 0.5000 ug | ORAL_CAPSULE | ORAL | 0 refills | Status: DC

## 2017-02-17 NOTE — Patient Instructions (Signed)
Medication Instructions:  Continue current medications  If you need a refill on your cardiac medications before your next appointment, please call your pharmacy.  Labwork: None Ordered   Testing/Procedures: None Ordered  Follow-Up: Your physician wants you to follow-up in: 1 Year. You should receive a reminder letter in the mail two months in advance. If you do not receive a letter, please call our office [phone].    Thank you for choosing [facility]!!      

## 2017-02-18 ENCOUNTER — Encounter: Payer: Self-pay | Admitting: Cardiology

## 2017-02-24 ENCOUNTER — Ambulatory Visit (INDEPENDENT_AMBULATORY_CARE_PROVIDER_SITE_OTHER): Payer: Medicare Other | Admitting: Podiatry

## 2017-02-24 ENCOUNTER — Encounter: Payer: Self-pay | Admitting: Podiatry

## 2017-02-24 ENCOUNTER — Encounter: Payer: Self-pay | Admitting: Family Medicine

## 2017-02-24 VITALS — BP 116/80 | HR 69 | Resp 16

## 2017-02-24 DIAGNOSIS — E1122 Type 2 diabetes mellitus with diabetic chronic kidney disease: Secondary | ICD-10-CM | POA: Diagnosis not present

## 2017-02-24 DIAGNOSIS — N185 Chronic kidney disease, stage 5: Secondary | ICD-10-CM | POA: Diagnosis not present

## 2017-02-24 DIAGNOSIS — E1151 Type 2 diabetes mellitus with diabetic peripheral angiopathy without gangrene: Secondary | ICD-10-CM

## 2017-02-24 NOTE — Progress Notes (Signed)
Subjective:  Patient ID: Douglas Meyer, male    DOB: November 05, 1940,  MRN: [id number] HPI Chief Complaint  Patient presents with  . Diabetes    Diabetic foot exam and nail care     76 y.o. male presents with the above complaint. Reports diabetes and end-stage renal disease for which he is on dialysis. Denies numbness and tingling in his feet. Denies burning and cramping in his legs.  Past Medical History:  Diagnosis Date  . Bilateral carotid artery disease ([place])   . CAD (coronary artery disease) 05/15/10   out of hospital myocardial infarction in the 1990's. this was picked up on an  EKG in 1997.  Cardiac catherterization demonstrated an occluded vessel and collateras.  I have no description of this.  A stress  perfusion study  done 2008 demonstrated an ejection fraction  of  53% with inferolateral  hypokinesis.  There was an inferolateral defect with  scar and some mild mixed ischemia.   managed rx  . Cancer ([place])    skin  . CKD (chronic kidney disease), stage IV ([place])   . Diabetes mellitus    DM, type II  . ED (erectile dysfunction)   . Gout   . History of kidney stones   . Hx of cardiovascular stress test    a. Lexiscan myoview 10/18/12: EF 53%, inf and inf-lat scar, slight peri-infarct ischemia, small area of apical ischemia; no change from 2006  . Hyperlipidemia   . Hypertension   . Myocardial infarction ([place])   . Nephrolithiasis   . Polyp of colon   . PVD (peripheral vascular disease) ([place])    Past Surgical History:  Procedure Laterality Date  . AV FISTULA PLACEMENT Right 06/19/2015   Procedure: ARTERIOVENOUS (AV) FISTULA CREATION;  Surgeon: Serafina Mitchell, MD;  Location: [place];  Service: Vascular;  Laterality: Right;  . BASCILIC VEIN TRANSPOSITION Left 08/11/2016   Procedure: BASCILIC VEIN TRANSPOSITION-LEFT 1ST STAGE;  Surgeon: Serafina Mitchell, MD;  Location: [place];  Service: Vascular;  Laterality: Left;  . BASCILIC VEIN TRANSPOSITION Left 10/21/2016   Procedure: LEFT ARM 2ND  STAGE BASILIC VEIN TRANSPOSITION;  Surgeon: Serafina Mitchell, MD;  [facility];  Service: Vascular;  Laterality: Left;  . COLONOSCOPY W/ POLYPECTOMY    . CYSTOSCOPY     stone removal x 1  . ENDARTERECTOMY Right 11/28/2014   Procedure: RIGHT CAROTID ENDARTERECTOMY;  Surgeon: Serafina Mitchell, MD;  Location: [place];  Service: Vascular;  Laterality: Right;  . EYE SURGERY  06/2013   bilateral cataract surgery  . HUMERUS FRACTURE SURGERY     left  . MOHS SURGERY     multiple  . PATCH ANGIOPLASTY Right 11/28/2014   Procedure: PATCH ANGIOPLASTY RIGHT CAROTID;  Surgeon: Serafina Mitchell, MD;  [facility];  Service: Vascular;  Laterality: Right;    Current Outpatient Prescriptions:  .  allopurinol (ZYLOPRIM) 300 MG tablet, TAKE 1 BY MOUTH DAILY, Disp: 90 tablet, Rfl: 1 .  amLODipine (NORVASC) 5 MG tablet, Take 1 tablet (5 mg total) by mouth daily., Disp: 90 tablet, Rfl: 3 .  Ascorbic Acid (VITAMIN C) 500 MG tablet, Take 500 mg by mouth at bedtime. , Disp: , Rfl:  .  aspirin 81 MG tablet, Take 81 mg by mouth at bedtime. , Disp: , Rfl:  .  atorvastatin (LIPITOR) 20 MG tablet, TAKE ONE TABLET BY MOUTH ONCE DAILY AT  6:00  PM, Disp: 90 tablet, Rfl: 3 .  BD PEN NEEDLE NANO  U/F 32G X 4 MM MISC, USE AS DIRECTED ONCE DAILY, Disp: 100 each, Rfl: 1 .  calcitRIOL (ROCALTROL) 0.5 MCG capsule, Take 1 capsule (0.5 mcg total) by mouth every other day., Disp: 30 capsule, Rfl: 0 .  furosemide (LASIX) 80 MG tablet, Take 1 tablet (80 mg total) by mouth 2 (two) times daily., Disp: , Rfl:  .  glipiZIDE (GLUCOTROL) 10 MG tablet, Take 1 tablet (10 mg total) by mouth every evening., Disp: , Rfl:  .  hydrocortisone 2.5 % cream, Apply 1 application topically every other day. Apply to scalp, Disp: , Rfl:  .  Insulin Glargine (LANTUS SOLOSTAR) 100 UNIT/ML Solostar Pen, INJECT 26 UNITS SUBCUTANEOUSLY AT BEDTIME, Disp: 3 pen, Rfl: 5 .  Lancets (ACCU-CHEK MULTICLIX) lancets, 1 each by Other route daily. Dx 250.01, Disp: 102  each, Rfl: 3 .  metoprolol (TOPROL-XL) 200 MG 24 hr tablet, Take 0.5 tablets (100 mg total) by mouth at bedtime. Keep appointment on 09/14 for further refill, Disp: 60 tablet, Rfl: 0 .  Multiple Vitamins-Minerals (MULTIVITAMIN WITH MINERALS) tablet, Take 1 tablet by mouth at bedtime. , Disp: , Rfl:  .  Omega-3 Fatty Acids (FISH OIL) 500 MG CAPS, Take 500 mg by mouth at bedtime. , Disp: , Rfl:  .  trandolapril (MAVIK) 4 MG tablet, Take 2 tablets (8 mg total) by mouth at bedtime. NEED OV., Disp: 180 tablet, Rfl: 0  No Known Allergies Review of Systems  All other systems reviewed and are negative.  Objective:   Vitals:   02/24/17 1027  BP: 116/80  Pulse: 69  Resp: 16   General AA&O x3. Normal mood and affect.  Vascular Dorsalis pedis and posterior tibial pulses  present 1+ and non-palpable bilat  Capillary refill normal to all digits. Pedal hair growth diminished.  Neurologic Epicritic sensation present bilaterally. Protective sensation with 5.07 monofilament  absent bilaterally. Vibratory sensation present bilaterally.  Dermatologic No open lesions. Interspaces clear of maceration.  Normal skin temperature and turgor. Hyperkeratotic lesions: none bilaterally. Nails: brittle, irregular, wavy nails, onychomycosis, thickening  Orthopedic: No history of amputation. MMT 5/5 in dorsiflexion, plantarflexion, inversion, and eversion. Normal lower extremity joint ROM without pain or crepitus.   Assessment & Plan:  Patient was evaluated and treated and all questions answered.  Diabetes with CKD/PAD, Onychomycosis -Educated on diabetic footcare. Diabetic risk level 1 -Nails x10 debrided sharply and manually with large nail nipper and rotary burr.   Procedure: Nail Debridement Rationale: Patient meets criteria for routine foot care due to class B findings Type of Debridement: manual, sharp debridement. Instrumentation: Nail nipper, rotary burr. Number of Nails: 10  Return in about 3  months (around 05/26/2017) for diabetic foot care.

## 2017-02-27 ENCOUNTER — Other Ambulatory Visit: Payer: Self-pay | Admitting: Cardiology

## 2017-02-27 NOTE — Telephone Encounter (Signed)
Rx has been sent to the pharmacy electronically. ° °

## 2017-03-15 ENCOUNTER — Other Ambulatory Visit: Payer: Self-pay | Admitting: Family Medicine

## 2017-03-31 ENCOUNTER — Other Ambulatory Visit: Payer: Self-pay | Admitting: Cardiology

## 2017-03-31 DIAGNOSIS — I10 Essential (primary) hypertension: Secondary | ICD-10-CM

## 2017-04-03 ENCOUNTER — Other Ambulatory Visit: Payer: Self-pay | Admitting: Cardiology

## 2017-04-03 NOTE — Telephone Encounter (Signed)
REFILL 

## 2017-04-24 ENCOUNTER — Other Ambulatory Visit: Payer: Self-pay | Admitting: Family Medicine

## 2017-05-01 ENCOUNTER — Encounter: Payer: Self-pay | Admitting: Physician Assistant

## 2017-05-01 ENCOUNTER — Other Ambulatory Visit: Payer: Self-pay

## 2017-05-01 ENCOUNTER — Ambulatory Visit (HOSPITAL_COMMUNITY)
Admission: RE | Admit: 2017-05-01 | Discharge: 2017-05-01 | Disposition: A | Discharge status: Home / Self Care | Payer: Medicare Other | Source: Ambulatory Visit | Attending: Physician Assistant | Admitting: Physician Assistant

## 2017-05-01 ENCOUNTER — Ambulatory Visit: Payer: Medicare Other | Admitting: Physician Assistant

## 2017-05-01 VITALS — BP 144/62 | HR 72 | Temp 98.0°F | Resp 16 | Ht 66.0 in | Wt 154.0 lb

## 2017-05-01 DIAGNOSIS — M79662 Pain in left lower leg: Secondary | ICD-10-CM | POA: Diagnosis not present

## 2017-05-01 DIAGNOSIS — I70202 Unspecified atherosclerosis of native arteries of extremities, left leg: Secondary | ICD-10-CM | POA: Diagnosis not present

## 2017-05-01 NOTE — Progress Notes (Signed)
PRIMARY CARE AT [facility] [address], [place] [address] [phone]  Date:  05/01/2017   Name:  Douglas Meyer   DOB:  06/26/1940   MRN:  [id number]  PCP:  Dorena Cookey, MD    History of Present Illness:  Douglas Meyer is a 76 y.o. male patient who presents to PCP with pertinent pmh of coronary atherosclerosis, MI, PVD, DM 1, gout, and impaired renal function is here today for cc of leg pain for 3 days. Woke up with calf soreness, thought he had slept funny, but this is progressively worsen ed.  Relieved with elevating and bending the leg will relieve the pain.  Pain is intermittent at the left leg.   Every week, he is picking up furniture and lifting, but never had any issue with his left leg.  No numbness or tingling.  He has noticed no swelling of the calf.  He has some ankle swelling chronically.  No recent hospitalizations in the last 3 months.  No sob, coughing, hemoptysis.  He takes 81 mg daily of aspirin.   No hx of DVT  Patient Active Problem List   Diagnosis Date Noted  . Sensorineural hearing loss (SNHL) of both ears 01/10/2017  . Asymptomatic stenosis of right carotid artery 11/28/2014  . Carotid bruit 09/22/2014  . Onychomycosis 10/09/2012  . Gout 10/02/2008  . ERECTILE DYSFUNCTION 08/16/2007  . MYOCARDIAL INFARCTION 08/16/2007  . NEPHROLITHIASIS, HX OF 08/16/2007  . POLYP, COLON 03/29/2007  . Type 1 diabetes mellitus ([place]) 02/01/2007  . HYPERLIPIDEMIA 02/01/2007  . Essential hypertension 02/01/2007  . Coronary atherosclerosis 02/01/2007  . Peripheral vascular disease ([place]) 02/01/2007  . Disorder resulting from impaired renal function 02/01/2007    Past Medical History:  Diagnosis Date  . Bilateral carotid artery disease ([place])   . CAD (coronary artery disease) 05/15/10   out of hospital myocardial infarction in the 1990's. this was picked up on an  EKG in 1997.  Cardiac catherterization demonstrated an occluded vessel and collateras.  I have no description  of this.  A stress  perfusion study  done 2008 demonstrated an ejection fraction  of  53% with inferolateral  hypokinesis.  There was an inferolateral defect with  scar and some mild mixed ischemia.   managed rx  . Cancer ([place])    skin  . CKD (chronic kidney disease), stage IV ([place])   . Diabetes mellitus    DM, type II  . ED (erectile dysfunction)   . Gout   . History of kidney stones   . Hx of cardiovascular stress test    a. Lexiscan myoview 10/18/12: EF 53%, inf and inf-lat scar, slight peri-infarct ischemia, small area of apical ischemia; no change from 2006  . Hyperlipidemia   . Hypertension   . Myocardial infarction ([place])   . Nephrolithiasis   . Polyp of colon   . PVD (peripheral vascular disease) ([place])     Past Surgical History:  Procedure Laterality Date  . AV FISTULA PLACEMENT Right 06/19/2015   Procedure: ARTERIOVENOUS (AV) FISTULA CREATION;  Surgeon: Serafina Mitchell, MD;  Location: [place];  Service: Vascular;  Laterality: Right;  . BASCILIC VEIN TRANSPOSITION Left 08/11/2016   Procedure: BASCILIC VEIN TRANSPOSITION-LEFT 1ST STAGE;  Surgeon: Serafina Mitchell, MD;  Location: [place];  Service: Vascular;  Laterality: Left;  . BASCILIC VEIN TRANSPOSITION Left 10/21/2016   Procedure: LEFT ARM 2ND STAGE BASILIC VEIN TRANSPOSITION;  Surgeon: Serafina Mitchell, MD;  Location: [place];  Service:  Vascular;  Laterality: Left;  . COLONOSCOPY W/ POLYPECTOMY    . CYSTOSCOPY     stone removal x 1  . ENDARTERECTOMY Right 11/28/2014   Procedure: RIGHT CAROTID ENDARTERECTOMY;  Surgeon: Serafina Mitchell, MD;  Location: [place];  Service: Vascular;  Laterality: Right;  . EYE SURGERY  06/2013   bilateral cataract surgery  . HUMERUS FRACTURE SURGERY     left  . MOHS SURGERY     multiple  . PATCH ANGIOPLASTY Right 11/28/2014   Procedure: PATCH ANGIOPLASTY RIGHT CAROTID;  Surgeon: Serafina Mitchell, MD;  Location: [facility] OR;  Service: Vascular;  Laterality: Right;    Social History   Tobacco Use  . Smoking  status: Former Smoker    Years: 6.00    Last attempt to quit: 06/06/1973    Years since quitting: 43.9  . Smokeless tobacco: Never Used  Substance Use Topics  . Alcohol use: Yes    Alcohol/week: 4.2 oz    Types: 7 Cans of beer per week    Comment: 1 beer daily  . Drug use: No    Family History  Problem Relation Age of Onset  . Heart attack Father        deceased 47  . Diabetes Brother        rheumatic fever, peptic ulcer disease, deceased  . Diabetes Sister        deceased 37  . Heart attack Mother        deceased age 76s  . Diabetes type II Brother   . Diabetes type II Brother     No Known Allergies  Medication list has been reviewed and updated.  Current Outpatient Medications on File Prior to Visit  Medication Sig Dispense Refill  . allopurinol (ZYLOPRIM) 300 MG tablet TAKE 1 BY MOUTH DAILY 90 tablet 1  . amLODipine (NORVASC) 5 MG tablet Take 1 tablet (5 mg total) by mouth daily. 90 tablet 3  . Ascorbic Acid (VITAMIN C) 500 MG tablet Take 500 mg by mouth at bedtime.     Marland Kitchen aspirin 81 MG tablet Take 81 mg by mouth at bedtime.     Marland Kitchen atorvastatin (LIPITOR) 20 MG tablet TAKE ONE TABLET BY MOUTH ONCE DAILY AT  6:00  PM 90 tablet 3  . BD PEN NEEDLE NANO U/F 32G X 4 MM MISC  USE AS DIRECTED DAILY 100 each 0  . calcitRIOL (ROCALTROL) 0.5 MCG capsule TAKE 1 CAPSULE BY MOUTH EVERY OTHER DAY 45 capsule 3  . furosemide (LASIX) 80 MG tablet Take 1 tablet (80 mg total) by mouth 2 (two) times daily.    Marland Kitchen glipiZIDE (GLUCOTROL) 10 MG tablet Take 1 tablet (10 mg total) by mouth every evening.    . hydrocortisone 2.5 % cream Apply 1 application topically every other day. Apply to scalp    . Insulin Glargine (LANTUS SOLOSTAR) 100 UNIT/ML Solostar Pen INJECT 26 UNITS SUBCUTANEOUSLY AT BEDTIME 3 pen 5  . Lancets (ACCU-CHEK MULTICLIX) lancets 1 each by Other route daily. Dx 250.01 102 each 3  . metoprolol (TOPROL-XL) 200 MG 24 hr tablet Take 0.5 tablets (100 mg total) by mouth daily. 45 tablet  3  . Multiple Vitamins-Minerals (MULTIVITAMIN WITH MINERALS) tablet Take 1 tablet by mouth at bedtime.     . Omega-3 Fatty Acids (FISH OIL) 500 MG CAPS Take 500 mg by mouth at bedtime.     . trandolapril (MAVIK) 4 MG tablet Take 2 tablets (8 mg total) by mouth at bedtime. NEED  OV. 180 tablet 0  . trandolapril (MAVIK) 4 MG tablet Take 1 tablet (4 mg total) by mouth 2 (two) times daily. 180 tablet 3   No current facility-administered medications on file prior to visit.     ROS ROS otherwise unremarkable unless listed above.  Physical Examination: BP (!) 144/62   Pulse 72   Temp 98 F (36.7 C) (Oral)   Resp 16   Ht 5\' 6"  (1.676 m)   Wt 154 lb (69.9 kg)   SpO2 99%   BMI 24.86 kg/m  Ideal Body Weight:    Physical Exam  Constitutional: He is oriented to person, place, and time. He appears well-developed and well-nourished. No distress.  HENT:  Head: Normocephalic and atraumatic.  Eyes: Conjunctivae and EOM are normal. Pupils are equal, round, and reactive to light.  Cardiovascular: Normal rate.  Pulses:      Dorsalis pedis pulses are 1+ on the right side, and 1+ on the left side.  No edema of lower extremity.  Pulmonary/Chest: Effort normal. No respiratory distress.  Musculoskeletal:  Two mildly erythematous lesions at the left lateral area of the calf.  No tenderness upon palpation.  No tenderness upon palpation of the entire calf.  No swelling or edema.  Able to ambulate on tiptoe without pain, however this errupted later.  Small spasming of the muscle can be seen. Negative homan's  Neurological: He is alert and oriented to person, place, and time.  Skin: Skin is warm and dry. He is not diaphoretic.  Psychiatric: He has a normal mood and affect. His behavior is normal.     Assessment and Plan: Douglas Meyer is a 76 y.o. male who is here today for cc of  Chief Complaint  Patient presents with  . Leg Pain    calf/ left leg/ x 3 days   Given history, we will obtain a  doppler to rule out DVT.  Possible muscle cramp or strain.  Will contact pending imaging.   Pain of left calf - Plan: Basic metabolic panel, VAS [place] LOWER EXTREMITY VENOUS (DVT)  Ivar Drape, PA-C [facility] 11/26/201810:47 AM

## 2017-05-01 NOTE — Patient Instructions (Addendum)
Dear Douglas Meyer,  Thank you for coming to see [place] today.   Your Doppler study with Vascular is scheduled for 3pm today at [facility].  Please check in at the admitting desk at the main entrance located at the [facility] tower for your ultrasound exam.    IF you received an x-ray today, you will receive an invoice from [facility] Rad[facility]. Please contact [facility] at [phone] with questions or concerns regarding your invoice.   IF you received labwork today, you will receive an invoice from [facility]. Please contact [facility] at [phone] with questions or concerns regarding your invoice.   Our billing staff will not be able to assist you with questions regarding bills from these companies.  You will be contacted with the lab results as soon as they are available. The fastest way to get your results is to activate your My Chart account. Instructions are located on the last page of this paperwork. If you have not heard from [place] regarding the results in 2 weeks, please contact this office.

## 2017-05-01 NOTE — Progress Notes (Signed)
VASCULAR LAB PRELIMINARY  PRELIMINARY  PRELIMINARY  PRELIMINARY  Left lower extremity venous duplex completed.    Preliminary report:  There is no DVT or SVT noted in the left lower extremity.  Incidentally, there is stenosis noted in the left mid femoral artery.   Called results to [place], [place]  [place], [place], RVT 05/01/2017, 3:52 PM

## 2017-05-02 LAB — BASIC METABOLIC PANEL
BUN / CREAT RATIO: 16 (ref 10–24)
BUN: 77 mg/dL — AB (ref 8–27)
CO2: 21 mmol/L (ref 20–29)
CREATININE: 4.88 mg/dL — AB (ref 0.76–1.27)
Calcium: 9.6 mg/dL (ref 8.6–10.2)
Chloride: 101 mmol/L (ref 96–106)
GFR calc non Af Amer: 11 mL/min/{1.73_m2} — ABNORMAL LOW (ref >59)
GFR, EST AFRICAN AMERICAN: 12 mL/min/{1.73_m2} — AB (ref >59)
Glucose: 120 mg/dL — ABNORMAL HIGH (ref 65–99)
Potassium: 3.9 mmol/L (ref 3.5–5.2)
SODIUM: 142 mmol/L (ref 134–144)

## 2017-05-03 ENCOUNTER — Telehealth: Payer: Self-pay | Admitting: Family Medicine

## 2017-05-03 DIAGNOSIS — I739 Peripheral vascular disease, unspecified: Secondary | ICD-10-CM

## 2017-05-03 NOTE — Telephone Encounter (Signed)
I placed appointment for 1045 05/04/2017.   I made a referral order, but he does already attend vein and vascular--so not sure if needed.Marland Kitchen  appt made with dr. Oneida Alar.  Normally sees [place]

## 2017-05-03 NOTE — Telephone Encounter (Signed)
I do not see a referral placed for Vein specialist. Please place if appropriate. Thanks!

## 2017-05-03 NOTE — Telephone Encounter (Signed)
Sent phone message re: referral to Vein specialist to Referrals - High Priority.  Thanks

## 2017-05-03 NOTE — Telephone Encounter (Signed)
Copied from [place] [id number]. Topic: Quick Communication - See Telephone Encounter >> May 03, 2017 11:37 AM Ivar Drape wrote: CRM for notification. See Telephone encounter for:  05/03/17. Patient's wife said the patient was told that someone would be calling about a referral to a Vein and Vascular doctor.  She said her husband is in a lot of pain and they haven't heard anything from anyone on the referral.  Please call.

## 2017-05-04 ENCOUNTER — Telehealth: Payer: Self-pay | Admitting: Physician Assistant

## 2017-05-04 ENCOUNTER — Ambulatory Visit (INDEPENDENT_AMBULATORY_CARE_PROVIDER_SITE_OTHER): Payer: Medicare Other | Admitting: Vascular Surgery

## 2017-05-04 ENCOUNTER — Encounter: Payer: Self-pay | Admitting: Vascular Surgery

## 2017-05-04 ENCOUNTER — Ambulatory Visit (HOSPITAL_COMMUNITY)
Admission: RE | Admit: 2017-05-04 | Discharge: 2017-05-04 | Disposition: A | Discharge status: Home / Self Care | Payer: Medicare Other | Source: Ambulatory Visit | Attending: Vascular Surgery | Admitting: Vascular Surgery

## 2017-05-04 VITALS — BP 158/72 | HR 67 | Temp 97.3°F | Resp 16 | Ht 66.0 in | Wt 153.8 lb

## 2017-05-04 DIAGNOSIS — M79605 Pain in left leg: Secondary | ICD-10-CM

## 2017-05-04 DIAGNOSIS — I82402 Acute embolism and thrombosis of unspecified deep veins of left lower extremity: Secondary | ICD-10-CM

## 2017-05-04 NOTE — Telephone Encounter (Signed)
He is there.Marland KitchenMarland Kitchen

## 2017-05-04 NOTE — Progress Notes (Signed)
Referring Physician: Ivar Drape PA-C  Patient name: Douglas Meyer MRN: [id number] DOB: [birth year] Sex: male  REASON FOR CONSULT: Left leg pain  HPI: Douglas Meyer is a 76 y.o. male with a 3-4 day history of pain in his left calf. The pain is fairly continuous. It is made worse by putting his leg and certain positions. The pain also gets worse with walking. He denies rest pain in the foot. He has no prior history of claudication. He does have a remote history of smoking but quit 25 years ago. He has a history of peripheral arterial disease and previously had carotid endarterectomy by my partner Dr. Trula Slade. Other medical problems include CK D4, diabetes, coronary artery disease, hyperlipidemia, hypertension all of which have been stable. He denies prior history of cardiac arrhythmia or atrial fibrillation. He is on a statin and aspirin.  Past Medical History:  Diagnosis Date  . Bilateral carotid artery disease ([place])   . CAD (coronary artery disease) 05/15/10   out of hospital myocardial infarction in the 1990's. this was picked up on an  EKG in 1997.  Cardiac catherterization demonstrated an occluded vessel and collateras.  I have no description of this.  A stress  perfusion study  done 2008 demonstrated an ejection fraction  of  53% with inferolateral  hypokinesis.  There was an inferolateral defect with  scar and some mild mixed ischemia.   managed rx  . Cancer ([place])    skin  . CKD (chronic kidney disease), stage IV ([place])   . Diabetes mellitus    DM, type II  . ED (erectile dysfunction)   . Gout   . History of kidney stones   . Hx of cardiovascular stress test    a. Lexiscan myoview 10/18/12: EF 53%, inf and inf-lat scar, slight peri-infarct ischemia, small area of apical ischemia; no change from 2006  . Hyperlipidemia   . Hypertension   . Myocardial infarction ([place])   . Nephrolithiasis   . Polyp of colon   . PVD (peripheral vascular disease) ([place])    Past Surgical History:    Procedure Laterality Date  . AV FISTULA PLACEMENT Right 06/19/2015   Procedure: ARTERIOVENOUS (AV) FISTULA CREATION;  Surgeon: Serafina Mitchell, MD;  Location: [place];  Service: Vascular;  Laterality: Right;  . BASCILIC VEIN TRANSPOSITION Left 08/11/2016   Procedure: BASCILIC VEIN TRANSPOSITION-LEFT 1ST STAGE;  Surgeon: Serafina Mitchell, MD;  Location: [place];  Service: Vascular;  Laterality: Left;  . BASCILIC VEIN TRANSPOSITION Left 10/21/2016   Procedure: LEFT ARM 2ND STAGE BASILIC VEIN TRANSPOSITION;  Surgeon: Serafina Mitchell, MD;  Location: [facility];  Service: Vascular;  Laterality: Left;  . COLONOSCOPY W/ POLYPECTOMY    . CYSTOSCOPY     stone removal x 1  . ENDARTERECTOMY Right 11/28/2014   Procedure: RIGHT CAROTID ENDARTERECTOMY;  Surgeon: Serafina Mitchell, MD;  Location: [place];  Service: Vascular;  Laterality: Right;  . EYE SURGERY  06/2013   bilateral cataract surgery  . HUMERUS FRACTURE SURGERY     left  . MOHS SURGERY     multiple  . PATCH ANGIOPLASTY Right 11/28/2014   Procedure: PATCH ANGIOPLASTY RIGHT CAROTID;  Surgeon: Serafina Mitchell, MD;  Location: [facility] OR;  Service: Vascular;  Laterality: Right;    Family History  Problem Relation Age of Onset  . Heart attack Father        deceased 41  . Diabetes Brother        rheumatic  fever, peptic ulcer disease, deceased  . Diabetes Sister        deceased 59  . Heart attack Mother        deceased age 69s  . Diabetes type II Brother   . Diabetes type II Brother   He denies family history of abdominal aortic aneurysm.  SOCIAL HISTORY: Social History   Socioeconomic History  . Marital status: Married    Spouse name: Not on file  . Number of children: 4  . Years of education: Not on file  . Highest education level: Not on file  Social Needs  . Financial resource strain: Not on file  . Food insecurity - worry: Not on file  . Food insecurity - inability: Not on file  . Transportation needs - medical: Not on file  . Transportation  needs - non-medical: Not on file  Occupational History    Employer: [employer]    Comment: retired    Fish farm manager: RETIRED  Tobacco Use  . Smoking status: Former Smoker    Years: 6.00    Last attempt to quit: 06/06/1973    Years since quitting: 43.9  . Smokeless tobacco: Never Used  Substance and Sexual Activity  . Alcohol use: Yes    Alcohol/week: 4.2 oz    Types: 7 Cans of beer per week    Comment: 1 beer daily  . Drug use: No  . Sexual activity: Not on file  Other Topics Concern  . Not on file  Social History Narrative   Lives with wife.      No Known Allergies  Current Outpatient Medications  Medication Sig Dispense Refill  . allopurinol (ZYLOPRIM) 300 MG tablet TAKE 1 BY MOUTH DAILY 90 tablet 1  . amLODipine (NORVASC) 5 MG tablet Take 1 tablet (5 mg total) by mouth daily. 90 tablet 3  . Ascorbic Acid (VITAMIN C) 500 MG tablet Take 500 mg by mouth at bedtime.     Marland Kitchen aspirin 81 MG tablet Take 81 mg by mouth at bedtime.     Marland Kitchen atorvastatin (LIPITOR) 20 MG tablet TAKE ONE TABLET BY MOUTH ONCE DAILY AT  6:00  PM 90 tablet 3  . BD PEN NEEDLE NANO U/F 32G X 4 MM MISC  USE AS DIRECTED DAILY 100 each 0  . calcitRIOL (ROCALTROL) 0.5 MCG capsule TAKE 1 CAPSULE BY MOUTH EVERY OTHER DAY 45 capsule 3  . furosemide (LASIX) 80 MG tablet Take 1 tablet (80 mg total) by mouth 2 (two) times daily.    Marland Kitchen glipiZIDE (GLUCOTROL) 10 MG tablet Take 1 tablet (10 mg total) by mouth every evening.    . hydrocortisone 2.5 % cream Apply 1 application topically every other day. Apply to scalp    . Insulin Glargine (LANTUS SOLOSTAR) 100 UNIT/ML Solostar Pen INJECT 26 UNITS SUBCUTANEOUSLY AT BEDTIME 3 pen 5  . Lancets (ACCU-CHEK MULTICLIX) lancets 1 each by Other route daily. Dx 250.01 102 each 3  . metoprolol (TOPROL-XL) 200 MG 24 hr tablet Take 0.5 tablets (100 mg total) by mouth daily. 45 tablet 3  . Multiple Vitamins-Minerals (MULTIVITAMIN WITH MINERALS) tablet Take 1 tablet by mouth at bedtime.     . Omega-3  Fatty Acids (FISH OIL) 500 MG CAPS Take 500 mg by mouth at bedtime.     . trandolapril (MAVIK) 4 MG tablet Take 2 tablets (8 mg total) by mouth at bedtime. NEED OV. 180 tablet 0  . trandolapril (MAVIK) 4 MG tablet Take 1 tablet (4 mg total) by  mouth 2 (two) times daily. 180 tablet 3   No current facility-administered medications for this visit.     ROS:   General:  No weight loss, Fever, chills  HEENT: No recent headaches, no nasal bleeding, no visual changes, no sore throat  Neurologic: No dizziness, blackouts, seizures. No recent symptoms of stroke or mini- stroke. No recent episodes of slurred speech, or temporary blindness.  Cardiac: No recent episodes of chest pain/pressure, no shortness of breath at rest.  No shortness of breath with exertion.  Denies history of atrial fibrillation or irregular heartbeat  Vascular: No history of rest pain in feet.  No history of claudication.  No history of non-healing ulcer, No history of DVT   Pulmonary: No home oxygen, no productive cough, no hemoptysis,  No asthma or wheezing  Musculoskeletal:  [ ]  Arthritis, [ ]  Low back pain,  [ ]  Joint pain  Hematologic:No history of hypercoagulable state.  No history of easy bleeding.  No history of anemia  Gastrointestinal: No hematochezia or melena,  No gastroesophageal reflux, no trouble swallowing  Urinary: [X]  chronic Kidney disease, [ ]  on HD - [ ]  MWF or [ ]  TTHS, [ ]  Burning with urination, [ ]  Frequent urination, [ ]  Difficulty urinating;   Skin: No rashes  Psychological: No history of anxiety,  No history of depression   Physical Examination  Vitals:   05/04/17 1153  BP: (!) 158/72  Pulse: 67  Resp: 16  Temp: (!) 97.3 F (36.3 C)  TempSrc: Oral  SpO2: 100%  Weight: 153 lb 12.8 oz (69.8 kg)  Height: 5\' 6"  (1.676 m)    Body mass index is 24.82 kg/m.  General:  Alert and oriented, no acute distress HEENT: Normal Neck: No bruit or JVD Pulmonary: Clear to auscultation  bilaterally Cardiac: Regular Rate and Rhythm without murmur Abdomen: Soft, non-tender, non-distended, no mass, no scars Skin: No rash Extremity Pulses:  2+ radial, brachial, femoral, 2+ popliteal absent dorsalis pedis, posterior tibial pulses bilaterally, palpable thrill left upper arm AV fistula Musculoskeletal: No deformity or edema  Neurologic: Upper and lower extremity motor 5/5 and symmetric  DATA:  Patient had a DVT ultrasound approximate 4 days ago. I reviewed this today. This showed no evidence of DVT. However, he did have calcification and some narrowing of the left superficial femoral artery.   we performed a bilateral lower extremity arterial duplex on the patient today. This showed bilateral diffuse calcified plaque no hemodynamic stenosis on the right leg 50% left superficial femoral artery stenosis but also noted was a posterior tibial vein thrombus.   ASSESSMENT:   DVT left posterior tibial vein. This was a fairly focal DVT.  PLAN:   Patient has a fairly focal posterior tibial vein DVT. Usually these are very low risk for propagation and pulmonary embolus. In light of the  Patient's multiple co-morbidities rather than placing him on one month of anticoagulation I believe the best option would be to increase his aspirin dose to one full adult aspirin daily. We will repeat his venous duplex in 2 weeks. If the thrombus has resolved and has had no propagation at that point he probably could be considered treated. If there is any propagation of the clot we would go to full anticoagulation with warfarin at that point. I discussed with the patient today signs and symptoms of worsening DVT with leg swelling or any signs or symptoms of pulmonary embolus with coughing shortness of breath or hemoptysis. If he exhibits an Av  symptoms he will call us sooner. Otherwise I will see him back in 2 weeks.   as far as the left superficial femoral artery stenosis is concerned this potentially could cause  him to have left leg claudication symptoms. However, the current symptoms he decribes are continuous and not necessarily related to exercise. If his symptoms do not resolve after the DVT is treated we could consider further evaluation of peripheral arterial disease.   Ruta Hinds, MD [facility] Office: [phone] Pager: [phone]

## 2017-05-04 NOTE — Telephone Encounter (Signed)
Copied from [place]. Topic: Quick Communication - See Telephone Encounter >> May 04, 2017 11:04 AM Ether Griffins B wrote: CRM for notification. See Telephone encounter for:  Pt didn't show for his vascular appt today 05/04/17.  05/04/17.

## 2017-05-04 NOTE — Telephone Encounter (Signed)
Sent notice of no show for appt to [place]

## 2017-05-05 ENCOUNTER — Other Ambulatory Visit: Payer: Self-pay

## 2017-05-05 DIAGNOSIS — M79605 Pain in left leg: Secondary | ICD-10-CM

## 2017-05-10 ENCOUNTER — Telehealth: Payer: Self-pay | Admitting: *Deleted

## 2017-05-10 NOTE — Telephone Encounter (Signed)
Patient states that his left leg has increased pain in the calf when walking. He saw Dr. Oneida Alar on 05-04-17. He has been taking his ASA 325mg  as directed. I have made him an appt tomorrow with our NP.

## 2017-05-11 ENCOUNTER — Other Ambulatory Visit: Payer: Self-pay

## 2017-05-11 ENCOUNTER — Encounter: Payer: Self-pay | Admitting: Family

## 2017-05-11 ENCOUNTER — Ambulatory Visit (INDEPENDENT_AMBULATORY_CARE_PROVIDER_SITE_OTHER): Payer: Medicare Other | Admitting: Family

## 2017-05-11 ENCOUNTER — Other Ambulatory Visit: Payer: Self-pay | Admitting: Family Medicine

## 2017-05-11 VITALS — BP 140/62 | HR 67 | Temp 97.2°F | Resp 19 | Wt 154.0 lb

## 2017-05-11 DIAGNOSIS — E785 Hyperlipidemia, unspecified: Secondary | ICD-10-CM

## 2017-05-11 DIAGNOSIS — M79605 Pain in left leg: Secondary | ICD-10-CM | POA: Diagnosis not present

## 2017-05-11 DIAGNOSIS — I10 Essential (primary) hypertension: Secondary | ICD-10-CM

## 2017-05-11 DIAGNOSIS — I739 Peripheral vascular disease, unspecified: Secondary | ICD-10-CM | POA: Diagnosis not present

## 2017-05-11 DIAGNOSIS — I82402 Acute embolism and thrombosis of unspecified deep veins of left lower extremity: Secondary | ICD-10-CM | POA: Diagnosis not present

## 2017-05-11 NOTE — Patient Instructions (Signed)

## 2017-05-11 NOTE — Progress Notes (Signed)
[facility] [place]   CC: Worsening left calf pain with history of recent acute left calf DVT and peripheral artery occlusive disease  History of Present Illness Douglas Meyer is a 76 y.o. male who saw Dr. Oneida Alar on 05-04-17. At that time he had a 3-4 day history of pain in his left calf. The pain was fairly continuous. It is made worse by putting his leg and certain positions. The pain also gets worse with walking. He denies rest pain in the foot. He has no prior history of claudication. He does have a remote history of smoking but quit 25 years ago. He has a history of peripheral arterial disease and previously had a right carotid endarterectomy on 11-28-14 by Dr. Trula Slade. Other medical problems include CK D4, diabetes, coronary artery disease, hyperlipidemia, hypertension all of which have been stable. He denies prior history of cardiac arrhythmia or atrial fibrillation. He is on a statin and aspirin.  Venous duplex on 05-04-17 showed a fairly focal posterior tibial vein DVT. Usually these are very low risk for propagation and pulmonary embolus. In light of the  Patient's multiple co-morbidities rather than placing him on one month of anticoagulation, Dr. Oneida Alar believed the best option would be to increase his aspirin dose to one full adult aspirin daily. We will repeat his venous duplex in 2 weeks. If the thrombus has resolved and has had no propagation at that point he probably could be considered treated. If there is any propagation of the clot we would go to full anticoagulation with warfarin at that point. I discussed with the patient today signs and symptoms of worsening DVT with leg swelling or any signs or symptoms of pulmonary embolus with coughing shortness of breath or hemoptysis. If he exhibits any symptoms he was to call us sooner. Otherwise Dr. Oneida Alar was to see him back in 2 weeks. As far as the left superficial femoral artery stenosis is concerned this potentially  could cause him to have left leg claudication symptoms. However, the current symptoms he decribes are continuous and not necessarily related to exercise. If his symptoms do not resolve after the DVT is treated we could consider further evaluation of peripheral arterial disease.  He returns today after phone call received from pt yesterday, states that his left leg has increased pain in the calf when walking. The left posterior calf also hurts more often when sitting, until he an finds a position that is more comfortable.   He saw Dr. Oneida Alar on 05-04-17. He has been taking his ASA 325mg  as directed. He denies dyspnea.   He has not yet started hemodialysis, but states Dr. Jimmy Footman set him up to learn about home hemodialyses.  His left upper arm AV fistula has a palpable thrill.   Pt Diabetic: Yes, last A1C result on file was 6.7 on 08-11-16 Pt smoker: former smoker, quit about 1998, started at age 40 years  Pt meds include: Statin :Yes Betablocker: Yes ASA: Yes Other anticoagulants/antiplatelets: no   Past Medical History:  Diagnosis Date  . Bilateral carotid artery disease ([place])   . CAD (coronary artery disease) 05/15/10   out of hospital myocardial infarction in the 1990's. this was picked up on an  EKG in 1997.  Cardiac catherterization demonstrated an occluded vessel and collateras.  I have no description of this.  A stress  perfusion study  done 2008 demonstrated an ejection fraction  of  53% with inferolateral  hypokinesis.  There was an inferolateral defect  with  scar and some mild mixed ischemia.   managed rx  . Cancer ([place])    skin  . CKD (chronic kidney disease), stage IV ([place])   . Diabetes mellitus    DM, type II  . ED (erectile dysfunction)   . Gout   . History of kidney stones   . Hx of cardiovascular stress test    a. Lexiscan myoview 10/18/12: EF 53%, inf and inf-lat scar, slight peri-infarct ischemia, small area of apical ischemia; no change from 2006  . Hyperlipidemia    . Hypertension   . Myocardial infarction ([place])   . Nephrolithiasis   . Polyp of colon   . PVD (peripheral vascular disease) ([place])     Social History Social History   Tobacco Use  . Smoking status: Former Smoker    Years: 6.00    Last attempt to quit: 06/06/1973    Years since quitting: 43.9  . Smokeless tobacco: Never Used  Substance Use Topics  . Alcohol use: Yes    Alcohol/week: 4.2 oz    Types: 7 Cans of beer per week    Comment: 1 beer daily  . Drug use: No    Family History Family History  Problem Relation Age of Onset  . Heart attack Father        deceased 10  . Diabetes Brother        rheumatic fever, peptic ulcer disease, deceased  . Diabetes Sister        deceased 27  . Heart attack Mother        deceased age 65s  . Diabetes type II Brother   . Diabetes type II Brother     Past Surgical History:  Procedure Laterality Date  . AV FISTULA PLACEMENT Right 06/19/2015   Procedure: ARTERIOVENOUS (AV) FISTULA CREATION;  Surgeon: Serafina Mitchell, MD;  Location: [facility];  Service: Vascular;  Laterality: Right;  . BASCILIC VEIN TRANSPOSITION Left 08/11/2016   Procedure: BASCILIC VEIN TRANSPOSITION-LEFT 1ST STAGE;  Surgeon: Serafina Mitchell, MD;  Location: [facility];  Service: Vascular;  Laterality: Left;  . BASCILIC VEIN TRANSPOSITION Left 10/21/2016   Procedure: LEFT ARM 2ND STAGE BASILIC VEIN TRANSPOSITION;  Surgeon: Serafina Mitchell, MD;  Location: [facility] OR;  Service: Vascular;  Laterality: Left;  . COLONOSCOPY W/ POLYPECTOMY    . CYSTOSCOPY     stone removal x 1  . ENDARTERECTOMY Right 11/28/2014   Procedure: RIGHT CAROTID ENDARTERECTOMY;  Surgeon: Serafina Mitchell, MD;  Location: [facility];  Service: Vascular;  Laterality: Right;  . EYE SURGERY  06/2013   bilateral cataract surgery  . HUMERUS FRACTURE SURGERY     left  . MOHS SURGERY     multiple  . PATCH ANGIOPLASTY Right 11/28/2014   Procedure: PATCH ANGIOPLASTY RIGHT CAROTID;  Surgeon: Serafina Mitchell, MD;  Location: [facility] OR;   Service: Vascular;  Laterality: Right;    No Known Allergies  Current Outpatient Medications  Medication Sig Dispense Refill  . allopurinol (ZYLOPRIM) 300 MG tablet TAKE 1 BY MOUTH DAILY 90 tablet 1  . amLODipine (NORVASC) 5 MG tablet Take 1 tablet (5 mg total) by mouth daily. 90 tablet 3  . Ascorbic Acid (VITAMIN C) 500 MG tablet Take 500 mg by mouth at bedtime.     Marland Kitchen aspirin 81 MG tablet Take 81 mg by mouth at bedtime.     Marland Kitchen atorvastatin (LIPITOR) 20 MG tablet TAKE ONE TABLET BY MOUTH ONCE DAILY AT  6:00  PM  90 tablet 3  . BD PEN NEEDLE NANO U/F 32G X 4 MM MISC  USE AS DIRECTED DAILY 100 each 0  . calcitRIOL (ROCALTROL) 0.5 MCG capsule TAKE 1 CAPSULE BY MOUTH EVERY OTHER DAY 45 capsule 3  . furosemide (LASIX) 80 MG tablet Take 1 tablet (80 mg total) by mouth 2 (two) times daily.    Marland Kitchen glipiZIDE (GLUCOTROL) 10 MG tablet Take 1 tablet (10 mg total) by mouth every evening.    . hydrocortisone 2.5 % cream Apply 1 application topically every other day. Apply to scalp    . Insulin Glargine (LANTUS SOLOSTAR) 100 UNIT/ML Solostar Pen INJECT 26 UNITS SUBCUTANEOUSLY AT BEDTIME 3 pen 5  . Lancets (ACCU-CHEK MULTICLIX) lancets 1 each by Other route daily. Dx 250.01 102 each 3  . metoprolol (TOPROL-XL) 200 MG 24 hr tablet Take 0.5 tablets (100 mg total) by mouth daily. 45 tablet 3  . Multiple Vitamins-Minerals (MULTIVITAMIN WITH MINERALS) tablet Take 1 tablet by mouth at bedtime.     . Omega-3 Fatty Acids (FISH OIL) 500 MG CAPS Take 500 mg by mouth at bedtime.     . trandolapril (MAVIK) 4 MG tablet Take 2 tablets (8 mg total) by mouth at bedtime. NEED OV. 180 tablet 0   No current facility-administered medications for this visit.     ROS: See HPI for pertinent positives and negatives.   Physical Examination  Vitals:   05/11/17 1342  BP: 140/62  Pulse: 67  Resp: 19  Temp: (!) 97.2 F (36.2 C)  TempSrc: Oral  SpO2: 95%  Weight: 154 lb (69.9 kg)   Body mass index is 24.86  kg/m.  General: A&O x 3, WDWN, elderly male. Gait: normal Eyes: PERRLA. Pulmonary: Respirations are non labored, CTAB, good air movement in all fields Cardiac: regular Rhythm, no detected murmur.         Carotid Bruits Right Left   Negative positive   Radial pulses are 2+ right, 1+ left palpable  Adominal aortic pulse is not palpable                         VASCULAR EXAM: Extremities without ischemic changes, without Gangrene; without open wounds. Left upper arm AV fistula with palpable thrill.                                                                                                           LE Pulses Right Left       FEMORAL  4+ palpable  3+ palpable        POPLITEAL  2+ palpable   2+ palpable       POSTERIOR TIBIAL  not palpable   not palpable        DORSALIS PEDIS      ANTERIOR TIBIAL not palpable  not palpable    Abdomen: soft, NT, no palpable masses. Skin: no rashes, no ulcers noted. Musculoskeletal: no muscle wasting or atrophy.  Neurologic: A&O X 3; Appropriate Affect ; SENSATION: normal; MOTOR FUNCTION:  moving all  extremities equally, motor strength 5/5 throughout. Speech is fluent/normal. CN 2-12 intact.    ASSESSMENT: Douglas Meyer is a 76 y.o. male who presents with worsening left calf pain at rest and aggravated by walking, certain positions ease pain. He has a known fairly focal posterior tibial vein DVT on venous duplex 05-04-17.Marland Kitchen Usually these are very low risk for propagation and pulmonary embolus. In light of the  Patient's multiple co-morbidities rather than placing him on one month of anticoagulation, Dr. Oneida Alar believed the best option would be to increase his aspirin dose to one full adult aspirin daily which was started on 05-04-17.  He has no dyspnea, lung sounds are clear.   He is also s/p  right carotid endarterectomy on 11-28-14.   I discussed with Dr. Oneida Alar pt HPI as above and physical exam results. Will obtain non contrast MRI of left  leg to r/o mass. We will repeat his venous duplex in 2 weeks, scheduled for 05-18-17.  DATA  Non today   PLAN:  Based on the patient's vascular studies and examination, pt will be scheduled for MRI of left leg to r/o mass, before he returns on 05-18-17 to see Dr. Oneida Alar. Pt is also scheduled on 05-18-17 for venous duplex of left leg, follow up DVT exam.   I discussed in depth with the patient the nature of atherosclerosis, and emphasized the importance of maximal medical management including strict control of blood pressure, blood glucose, and lipid levels, obtaining regular exercise, and continued cessation of smoking.  The patient is aware that without maximal medical management the underlying atherosclerotic disease process will progress, limiting the benefit of any interventions.  The patient was given information about PAD including signs, symptoms, treatment, what symptoms should prompt the patient to seek immediate medical care, and risk reduction measures to take.  Clemon Chambers, RN, MSN, FNP-C [facility] Phone: [phone]  Clinic MD: Oneida Alar  05/11/17 2:45 PM

## 2017-05-18 ENCOUNTER — Ambulatory Visit (INDEPENDENT_AMBULATORY_CARE_PROVIDER_SITE_OTHER): Payer: Medicare Other | Admitting: Vascular Surgery

## 2017-05-18 ENCOUNTER — Ambulatory Visit (HOSPITAL_COMMUNITY)
Admission: RE | Admit: 2017-05-18 | Discharge: 2017-05-18 | Disposition: A | Discharge status: Home / Self Care | Payer: Medicare Other | Source: Ambulatory Visit | Attending: Vascular Surgery | Admitting: Vascular Surgery

## 2017-05-18 ENCOUNTER — Other Ambulatory Visit: Payer: Self-pay | Admitting: Vascular Surgery

## 2017-05-18 VITALS — BP 140/62 | HR 67 | Temp 97.1°F | Resp 16 | Ht 66.0 in | Wt 154.0 lb

## 2017-05-18 DIAGNOSIS — M79605 Pain in left leg: Secondary | ICD-10-CM | POA: Diagnosis not present

## 2017-05-18 DIAGNOSIS — I82542 Chronic embolism and thrombosis of left tibial vein: Secondary | ICD-10-CM | POA: Diagnosis not present

## 2017-05-18 DIAGNOSIS — I82442 Acute embolism and thrombosis of left tibial vein: Secondary | ICD-10-CM

## 2017-05-18 NOTE — Progress Notes (Signed)
Patient is a 76 year old male who was last evaluated by me November 29.  At that point he was found to have a left calf vein DVT.  He was primarily complaining of left leg pain.  States that for the first time today he actually has no pain in the left foot.  He does have a history of peripheral arterial disease and previously had a carotid endarterectomy by my partner Dr. Trula Slade.  He is on a statin.  He is also on aspirin for treatment of underlying coronary disease and peripheral arterial disease.  This was increased to 325 mg with finding the DVT.  He returns today after follow-up duplex ultrasound.  He also had an MRI of his left leg to rule out any other possible causes of pain in addition to his DVT.  Physical exam:  Vitals:   05/18/17 1412  BP: 140/62  Pulse: 67  Resp: 16  Temp: (!) 97.1 F (36.2 C)  TempSrc: Oral  SpO2: 99%  Weight: 154 lb (69.9 kg)  Height: 5\' 6"  (1.676 m)    Extremities: No significant edema left leg compared to right.  Data: Patient had a repeat duplex exam today.  There was no propagation of the posterior tibial vein DVT.  MRI left leg images were reviewed today this is no underlying pathology and was a normal exam.  Assessment: Calf vein DVT no evidence of propagation over the last 2 weeks.  Plan: Continue 1 full strength adult aspirin daily.  The patient has known underlying PAD.  He will follow-up in 6 months time with repeat ABIs.  Ruta Hinds, MD [facility] [place] Office: [phone] Pager: [phone]

## 2017-05-19 NOTE — Addendum Note (Signed)
Addended by: Lianne Cure A on: 05/19/2017 10:26 AM   Modules accepted: Orders

## 2017-05-26 ENCOUNTER — Ambulatory Visit (INDEPENDENT_AMBULATORY_CARE_PROVIDER_SITE_OTHER): Payer: Medicare Other | Admitting: Podiatry

## 2017-05-26 DIAGNOSIS — E1151 Type 2 diabetes mellitus with diabetic peripheral angiopathy without gangrene: Secondary | ICD-10-CM | POA: Diagnosis not present

## 2017-05-26 DIAGNOSIS — B351 Tinea unguium: Secondary | ICD-10-CM

## 2017-06-03 NOTE — Progress Notes (Signed)
  Subjective:  Patient ID: Douglas Meyer, male    DOB: 1941-03-26,  MRN: [id number]  No chief complaint on file.  76 y.o. male returns for diabetic foot care. Last AMBS was unknown. Reports recent diagnosis of a blood clot in his left lower leg  Objective:  There were no vitals filed for this visit. General AA&O x3. Normal mood and affect.  Vascular Dorsalis pedis pulses present 1+ bilaterally  Posterior tibial pulses absent bilaterally  Capillary refill normal to all digits. Pedal hair growth diminished.  Neurologic Epicritic sensation present bilaterally.  Dermatologic No open lesions. Interspaces clear of maceration.  Normal skin temperature and turgor. Hyperkeratotic lesions: none bilaterally. Nails: brittle, discoloration yellow, onychomycosis, thickening  Orthopedic: No history of amputation. MMT 5/5 in dorsiflexion, plantarflexion, inversion, and eversion. Normal lower extremity joint ROM without pain or crepitus.   Assessment & Plan:  Patient was evaluated and treated and all questions answered.  Diabetes with PAD, Onychomycosis -Educated on diabetic footcare. Diabetic risk level 0 -At risk foot care provided as below.  Procedure: Nail Debridement Rationale: Patient meets criteria for routine foot care due to class B findings Type of Debridement: manual, sharp debridement. Instrumentation: Nail nipper, rotary burr. Number of Nails: 10  Return in about 3 months (around 08/24/2017) for Diabetic Foot Care.

## 2017-06-06 ENCOUNTER — Other Ambulatory Visit: Payer: Self-pay | Admitting: Family Medicine

## 2017-06-06 DIAGNOSIS — M1A0790 Idiopathic chronic gout, unspecified ankle and foot, without tophus (tophi): Secondary | ICD-10-CM

## 2017-06-07 ENCOUNTER — Other Ambulatory Visit: Payer: Self-pay

## 2017-06-07 DIAGNOSIS — M1A0790 Idiopathic chronic gout, unspecified ankle and foot, without tophus (tophi): Secondary | ICD-10-CM

## 2017-06-07 MED ORDER — ALLOPURINOL 300 MG PO TABS: ORAL_TABLET | ORAL | 1 refills | Status: DC

## 2017-07-07 ENCOUNTER — Ambulatory Visit: Payer: Self-pay | Admitting: *Deleted

## 2017-07-07 NOTE — Telephone Encounter (Signed)
Pt reports CBG this am 260.  At 2000 last night 63, at HS 355. Pt states his Lantus was changed from 20units Q HS to 10units QAM on 06/30/17 by Dr. Ronnald Collum. Also takes Glipizide 10mg , has not missed dose. States BS has been fluctuating. Instructed to alert Dr.Morayati who is following him for DM. Home care advice per protocol given. Instructed to call back for 2 consecutive CBGs of 355, increased thirst, urination, SOB or rapid respirations. Instructed to stay hydrated and continue journal of BS checks. Pt states he will alert Dr. Ronnald Collum. Reason for Disposition . [1] Blood glucose > 240 mg/dl (13 mmol/l) AND [2] uses insulin (e.g., insulin-dependent, all people with type 1 diabetes)  Answer Assessment - Initial Assessment Questions 1. BLOOD GLUCOSE: "What is your blood glucose level?"      260 2. ONSET: "When did you check the blood glucose?"     1005 3. USUAL RANGE: "What is your glucose level usually?" (e.g., usual fasting morning value, usual evening value)     "All over the map." 4. KETONES: "Do you check for ketones (urine or blood test strips)?" If yes, ask: "What does the test show now?"      No 5. TYPE 1 or 2:  "Do you know what type of diabetes you have?"  (e.g., Type 1, Type 2, Gestational; doesn't know)      Type 2 6. INSULIN: "Do you take insulin?" If yes, ask: "Have you missed any shots recently?"     Lantus 10units q am. Have not missed any injections. 7. DIABETES PILLS: "Do you take any pills for your diabetes?" If yes, ask: "Have you missed taking any pills recently?"     Glipizide 10mg , no missed doses 8. OTHER SYMPTOMS: "Do you have any symptoms?" (e.g., fever, frequent urination, difficulty breathing, dizziness, weakness, vomiting)     No  Protocols used: DIABETES - HIGH BLOOD SUGAR-A-AH

## 2017-07-12 ENCOUNTER — Telehealth: Payer: Self-pay | Admitting: Family Medicine

## 2017-07-12 ENCOUNTER — Encounter: Payer: Self-pay | Admitting: Family Medicine

## 2017-07-12 ENCOUNTER — Other Ambulatory Visit: Payer: Self-pay

## 2017-07-12 ENCOUNTER — Encounter (HOSPITAL_COMMUNITY): Payer: Self-pay | Admitting: Emergency Medicine

## 2017-07-12 ENCOUNTER — Ambulatory Visit: Payer: Medicare Other | Admitting: Family Medicine

## 2017-07-12 VITALS — BP 100/52 | HR 64 | Temp 97.4°F | Wt 157.0 lb

## 2017-07-12 DIAGNOSIS — Z79899 Other long term (current) drug therapy: Secondary | ICD-10-CM

## 2017-07-12 DIAGNOSIS — N184 Chronic kidney disease, stage 4 (severe): Secondary | ICD-10-CM | POA: Diagnosis not present

## 2017-07-12 DIAGNOSIS — D631 Anemia in chronic kidney disease: Secondary | ICD-10-CM | POA: Diagnosis present

## 2017-07-12 DIAGNOSIS — E785 Hyperlipidemia, unspecified: Secondary | ICD-10-CM | POA: Diagnosis present

## 2017-07-12 DIAGNOSIS — I2510 Atherosclerotic heart disease of native coronary artery without angina pectoris: Secondary | ICD-10-CM | POA: Diagnosis present

## 2017-07-12 DIAGNOSIS — I252 Old myocardial infarction: Secondary | ICD-10-CM

## 2017-07-12 DIAGNOSIS — E1022 Type 1 diabetes mellitus with diabetic chronic kidney disease: Secondary | ICD-10-CM | POA: Diagnosis present

## 2017-07-12 DIAGNOSIS — Z833 Family history of diabetes mellitus: Secondary | ICD-10-CM

## 2017-07-12 DIAGNOSIS — Z794 Long term (current) use of insulin: Secondary | ICD-10-CM

## 2017-07-12 DIAGNOSIS — I5043 Acute on chronic combined systolic (congestive) and diastolic (congestive) heart failure: Secondary | ICD-10-CM | POA: Diagnosis present

## 2017-07-12 DIAGNOSIS — I2582 Chronic total occlusion of coronary artery: Secondary | ICD-10-CM | POA: Diagnosis present

## 2017-07-12 DIAGNOSIS — I255 Ischemic cardiomyopathy: Secondary | ICD-10-CM | POA: Diagnosis present

## 2017-07-12 DIAGNOSIS — Z992 Dependence on renal dialysis: Secondary | ICD-10-CM

## 2017-07-12 DIAGNOSIS — I272 Pulmonary hypertension, unspecified: Secondary | ICD-10-CM | POA: Diagnosis present

## 2017-07-12 DIAGNOSIS — I953 Hypotension of hemodialysis: Secondary | ICD-10-CM | POA: Diagnosis not present

## 2017-07-12 DIAGNOSIS — N179 Acute kidney failure, unspecified: Secondary | ICD-10-CM | POA: Diagnosis not present

## 2017-07-12 DIAGNOSIS — N186 End stage renal disease: Secondary | ICD-10-CM | POA: Diagnosis present

## 2017-07-12 DIAGNOSIS — I213 ST elevation (STEMI) myocardial infarction of unspecified site: Secondary | ICD-10-CM | POA: Diagnosis present

## 2017-07-12 DIAGNOSIS — I120 Hypertensive chronic kidney disease with stage 5 chronic kidney disease or end stage renal disease: Secondary | ICD-10-CM | POA: Diagnosis present

## 2017-07-12 DIAGNOSIS — D649 Anemia, unspecified: Secondary | ICD-10-CM

## 2017-07-12 DIAGNOSIS — Z87891 Personal history of nicotine dependence: Secondary | ICD-10-CM

## 2017-07-12 DIAGNOSIS — Z8249 Family history of ischemic heart disease and other diseases of the circulatory system: Secondary | ICD-10-CM

## 2017-07-12 DIAGNOSIS — M109 Gout, unspecified: Secondary | ICD-10-CM | POA: Diagnosis present

## 2017-07-12 DIAGNOSIS — N2581 Secondary hyperparathyroidism of renal origin: Secondary | ICD-10-CM | POA: Diagnosis present

## 2017-07-12 DIAGNOSIS — D509 Iron deficiency anemia, unspecified: Secondary | ICD-10-CM | POA: Diagnosis present

## 2017-07-12 DIAGNOSIS — R531 Weakness: Secondary | ICD-10-CM | POA: Insufficient documentation

## 2017-07-12 DIAGNOSIS — E1051 Type 1 diabetes mellitus with diabetic peripheral angiopathy without gangrene: Secondary | ICD-10-CM | POA: Diagnosis present

## 2017-07-12 DIAGNOSIS — Z7982 Long term (current) use of aspirin: Secondary | ICD-10-CM

## 2017-07-12 DIAGNOSIS — Z85828 Personal history of other malignant neoplasm of skin: Secondary | ICD-10-CM

## 2017-07-12 DIAGNOSIS — I132 Hypertensive heart and chronic kidney disease with heart failure and with stage 5 chronic kidney disease, or end stage renal disease: Secondary | ICD-10-CM | POA: Diagnosis present

## 2017-07-12 DIAGNOSIS — E10649 Type 1 diabetes mellitus with hypoglycemia without coma: Secondary | ICD-10-CM | POA: Diagnosis not present

## 2017-07-12 LAB — CBC
HEMATOCRIT: 30.9 % — AB (ref 39.0–52.0)
Hemoglobin: 10 g/dL — ABNORMAL LOW (ref 13.0–17.0)
MCH: 30.3 pg (ref 26.0–34.0)
MCHC: 32.4 g/dL (ref 30.0–36.0)
MCV: 93.6 fL (ref 78.0–100.0)
Platelets: 246 10*3/uL (ref 150–400)
RBC: 3.3 MIL/uL — AB (ref 4.22–5.81)
RDW: 15.2 % (ref 11.5–15.5)
WBC: 8.4 10*3/uL (ref 4.0–10.5)

## 2017-07-12 LAB — BASIC METABOLIC PANEL
Anion gap: 20 — ABNORMAL HIGH (ref 5–15)
BUN: 176 mg/dL — AB (ref 6–20)
CHLORIDE: 98 mmol/L — AB (ref 101–111)
CO2: 21 meq/L (ref 19–32)
CO2: 17 mmol/L — AB (ref 22–32)
Calcium: 9.1 mg/dL (ref 8.4–10.5)
Calcium: 9.4 mg/dL (ref 8.9–10.3)
Chloride: 95 mEq/L — ABNORMAL LOW (ref 96–112)
Creatinine, Ser: 10 mg/dL (ref 0.40–1.50)
Creatinine, Ser: 9.99 mg/dL — ABNORMAL HIGH (ref 0.61–1.24)
GFR calc non Af Amer: 4 mL/min — ABNORMAL LOW (ref >60)
GFR, EST AFRICAN AMERICAN: 5 mL/min — AB (ref >60)
GFR: 5.4 mL/min — AB (ref >60.00)
GLUCOSE: 110 mg/dL — AB (ref 70–99)
Glucose, Bld: 93 mg/dL (ref 65–99)
POTASSIUM: 4.6 meq/L (ref 3.5–5.1)
Potassium: 4.8 mmol/L (ref 3.5–5.1)
SODIUM: 135 meq/L (ref 135–145)
Sodium: 135 mmol/L (ref 135–145)

## 2017-07-12 LAB — URINALYSIS, ROUTINE W REFLEX MICROSCOPIC
BILIRUBIN URINE: NEGATIVE
Glucose, UA: NEGATIVE mg/dL
HGB URINE DIPSTICK: NEGATIVE
Ketones, ur: NEGATIVE mg/dL
LEUKOCYTES UA: NEGATIVE
NITRITE: NEGATIVE
PH: 5 (ref 5.0–8.0)
Protein, ur: NEGATIVE mg/dL
SPECIFIC GRAVITY, URINE: 1.011 (ref 1.005–1.030)

## 2017-07-12 LAB — CBC WITH DIFFERENTIAL/PLATELET
BASOS PCT: 0.5 % (ref 0.0–3.0)
Basophils Absolute: 0.1 10*3/uL (ref 0.0–0.1)
EOS ABS: 0.4 10*3/uL (ref 0.0–0.7)
EOS PCT: 4 % (ref 0.0–5.0)
HEMATOCRIT: 30.1 % — AB (ref 39.0–52.0)
HEMOGLOBIN: 10 g/dL — AB (ref 13.0–17.0)
LYMPHS PCT: 13.1 % (ref 12.0–46.0)
Lymphs Abs: 1.3 10*3/uL (ref 0.7–4.0)
MCHC: 33 g/dL (ref 30.0–36.0)
MCV: 95.2 fl (ref 78.0–100.0)
MONO ABS: 0.6 10*3/uL (ref 0.1–1.0)
Monocytes Relative: 5.6 % (ref 3.0–12.0)
NEUTROS ABS: 7.6 10*3/uL (ref 1.4–7.7)
Neutrophils Relative %: 76.8 % (ref 43.0–77.0)
PLATELETS: 224 10*3/uL (ref 150.0–400.0)
RBC: 3.17 Mil/uL — ABNORMAL LOW (ref 4.22–5.81)
RDW: 15.7 % — AB (ref 11.5–15.5)
WBC: 9.9 10*3/uL (ref 4.0–10.5)

## 2017-07-12 LAB — TSH: TSH: 4.66 u[IU]/mL — AB (ref 0.35–4.50)

## 2017-07-12 LAB — T3, FREE: T3, Free: 3.1 pg/mL (ref 2.3–4.2)

## 2017-07-12 LAB — HEMOGLOBIN A1C: Hgb A1c MFr Bld: 7.6 % — ABNORMAL HIGH (ref 4.6–6.5)

## 2017-07-12 LAB — IRON: Iron: 52 ug/dL (ref 42–165)

## 2017-07-12 LAB — VITAMIN B12

## 2017-07-12 LAB — FERRITIN: Ferritin: 255.7 ng/mL (ref 22.0–322.0)

## 2017-07-12 LAB — T4, FREE: Free T4: 0.89 ng/dL (ref 0.60–1.60)

## 2017-07-12 NOTE — Progress Notes (Signed)
Thank you he is Dr. Liam [facility] Bickers year 2 family practice Jenny Reichmann is a ID guide is patient can hardly 30s and 53 he has underlying diabetes type 1 end-stage renal disease bordering on dialysis comes in today because there is Dr. Collier Salina came to his house and change his neck made no contact with Dr. nephrologist that was used a Fransisca Kaufmann was on his primary care doctor hospital his car Lucy first name Date of birth is April 18, 1941 [address] is mobile numbers [phone] my cell phone [phone] appreciated and got he is a vasculopath vascular complications diabetes is just on the cost home dialysis control problems in things are moist he has Yvone Neu is a 77 year old married male nonsmoker who comes in today for evaluation of fatigue and other issues  He saw his nephrologist Dr. Clair Gulling Deterding in October. He was set up for home dialysis eye but they canceled because his renal function stabilizes. He's due to go back to see Dr. Donley Redder next month  He comes in today saying he felt fatigued since October. He's had no fever chills nausea vomiting diarrhea weight loss. He gives me a list of his medications and tells me that [facility] has sent a physician Dr. Maudry Mayhew to his home. He states that Dr. Lonia Chimera changed his Glucotrol from 10 mg daily to 5 mg twice a day and decreased his insulin from 26 units daily to 10. Fasting blood sugar this morning 122  He states the physician who came to his house also stopped his allopurinol. He also stopped his Norvasc. BP today 100/52 he is not lightheaded when he stands up  Social history unchanged except he and his wife have been driving to [place] every couple weeks having a family issue. In October he went to peripheral vascular because of pain in his legs. He was found to have a DVT in his left calf. His aspirin was increased from 81 mg tablet 325 and that resolved.  BP (!) 100/52 (BP Location: Right Arm, Patient Position: Sitting,  Cuff Size: Normal)   Pulse 64   Temp (!) 97.4 F (36.3 C) (Oral)   Wt 157 lb (71.2 kg)   SpO2 94%   BMI 25.34 kg/m  Very pale male no acute distress. Examination shows pale conjunctiva. Abdominal exam was negative rectal normal stool guaiac-negative  #1 fatigue............. check parameters SPECT he's anemic from his end-stage renal disease  #2 diabetes type 1.........Marland Kitchen recent change in medication as outlined above  #3 end-stage renal disease......... continue follow-up by Dr. Donley Redder

## 2017-07-12 NOTE — Telephone Encounter (Signed)
Call with critical results BUN is greater than 130, creatinine 10.00, GTR 5.40. I gave report to Dr. Volanda Napoleon for review.

## 2017-07-12 NOTE — Telephone Encounter (Signed)
Pt has a h/o Chronic kidney dz.and was being considered for dialysis per chart review.  Given increase in creatinine to 10 recommend pt proceed to the ED.

## 2017-07-12 NOTE — Telephone Encounter (Signed)
I spoke with pt and went over below message from Dr. Volanda Napoleon, he agreed to have his wife drive him to [facility] ER now.

## 2017-07-12 NOTE — ED Triage Notes (Signed)
Pt reports being sent here by Dr. Sherren Mocha due to concerning lab results for Kidney failure. Pt has bilateral arm dialysis access but has been borderline up until this point and never needed dialysis.

## 2017-07-12 NOTE — Patient Instructions (Signed)
Labs today..... I will call you the report  Continue current medications .......... I will discuss any changes with your nephrologist ............Marland Kitchen

## 2017-07-12 NOTE — Telephone Encounter (Signed)
Confirmed patient has arrived in the ER now. 

## 2017-07-12 NOTE — Telephone Encounter (Signed)
Monitor for ER arrival 

## 2017-07-13 ENCOUNTER — Encounter (HOSPITAL_COMMUNITY): Payer: Self-pay | Admitting: Internal Medicine

## 2017-07-13 ENCOUNTER — Other Ambulatory Visit: Payer: Self-pay

## 2017-07-13 ENCOUNTER — Inpatient Hospital Stay (HOSPITAL_COMMUNITY)
Admission: EM | Admit: 2017-07-13 | Discharge: 2017-07-19 | DRG: 682 | Disposition: A | Discharge status: Home Health | Payer: Medicare Other | Attending: Internal Medicine | Admitting: Internal Medicine

## 2017-07-13 ENCOUNTER — Inpatient Hospital Stay (HOSPITAL_COMMUNITY): Payer: Medicare Other

## 2017-07-13 DIAGNOSIS — I2582 Chronic total occlusion of coronary artery: Secondary | ICD-10-CM | POA: Diagnosis present

## 2017-07-13 DIAGNOSIS — I739 Peripheral vascular disease, unspecified: Secondary | ICD-10-CM | POA: Diagnosis not present

## 2017-07-13 DIAGNOSIS — Z794 Long term (current) use of insulin: Secondary | ICD-10-CM | POA: Diagnosis not present

## 2017-07-13 DIAGNOSIS — I2510 Atherosclerotic heart disease of native coronary artery without angina pectoris: Secondary | ICD-10-CM

## 2017-07-13 DIAGNOSIS — I214 Non-ST elevation (NSTEMI) myocardial infarction: Secondary | ICD-10-CM

## 2017-07-13 DIAGNOSIS — R7989 Other specified abnormal findings of blood chemistry: Secondary | ICD-10-CM

## 2017-07-13 DIAGNOSIS — E1021 Type 1 diabetes mellitus with diabetic nephropathy: Secondary | ICD-10-CM | POA: Diagnosis not present

## 2017-07-13 DIAGNOSIS — E1051 Type 1 diabetes mellitus with diabetic peripheral angiopathy without gangrene: Secondary | ICD-10-CM | POA: Diagnosis present

## 2017-07-13 DIAGNOSIS — I132 Hypertensive heart and chronic kidney disease with heart failure and with stage 5 chronic kidney disease, or end stage renal disease: Secondary | ICD-10-CM | POA: Diagnosis present

## 2017-07-13 DIAGNOSIS — N181 Chronic kidney disease, stage 1: Secondary | ICD-10-CM | POA: Diagnosis not present

## 2017-07-13 DIAGNOSIS — M109 Gout, unspecified: Secondary | ICD-10-CM | POA: Diagnosis present

## 2017-07-13 DIAGNOSIS — I10 Essential (primary) hypertension: Secondary | ICD-10-CM | POA: Diagnosis not present

## 2017-07-13 DIAGNOSIS — I340 Nonrheumatic mitral (valve) insufficiency: Secondary | ICD-10-CM | POA: Diagnosis not present

## 2017-07-13 DIAGNOSIS — R748 Abnormal levels of other serum enzymes: Secondary | ICD-10-CM | POA: Diagnosis not present

## 2017-07-13 DIAGNOSIS — I5021 Acute systolic (congestive) heart failure: Secondary | ICD-10-CM

## 2017-07-13 DIAGNOSIS — N185 Chronic kidney disease, stage 5: Secondary | ICD-10-CM | POA: Diagnosis not present

## 2017-07-13 DIAGNOSIS — N186 End stage renal disease: Secondary | ICD-10-CM | POA: Diagnosis present

## 2017-07-13 DIAGNOSIS — I255 Ischemic cardiomyopathy: Secondary | ICD-10-CM | POA: Diagnosis not present

## 2017-07-13 DIAGNOSIS — I120 Hypertensive chronic kidney disease with stage 5 chronic kidney disease or end stage renal disease: Secondary | ICD-10-CM | POA: Diagnosis present

## 2017-07-13 DIAGNOSIS — M1A0790 Idiopathic chronic gout, unspecified ankle and foot, without tophus (tophi): Secondary | ICD-10-CM | POA: Diagnosis not present

## 2017-07-13 DIAGNOSIS — N179 Acute kidney failure, unspecified: Principal | ICD-10-CM

## 2017-07-13 DIAGNOSIS — E119 Type 2 diabetes mellitus without complications: Secondary | ICD-10-CM

## 2017-07-13 DIAGNOSIS — R9431 Abnormal electrocardiogram [ECG] [EKG]: Secondary | ICD-10-CM | POA: Diagnosis not present

## 2017-07-13 DIAGNOSIS — Z7982 Long term (current) use of aspirin: Secondary | ICD-10-CM | POA: Diagnosis not present

## 2017-07-13 DIAGNOSIS — N189 Chronic kidney disease, unspecified: Secondary | ICD-10-CM

## 2017-07-13 DIAGNOSIS — Z992 Dependence on renal dialysis: Secondary | ICD-10-CM | POA: Diagnosis not present

## 2017-07-13 DIAGNOSIS — N184 Chronic kidney disease, stage 4 (severe): Secondary | ICD-10-CM

## 2017-07-13 DIAGNOSIS — D509 Iron deficiency anemia, unspecified: Secondary | ICD-10-CM | POA: Diagnosis present

## 2017-07-13 DIAGNOSIS — E1022 Type 1 diabetes mellitus with diabetic chronic kidney disease: Secondary | ICD-10-CM | POA: Diagnosis present

## 2017-07-13 DIAGNOSIS — D631 Anemia in chronic kidney disease: Secondary | ICD-10-CM | POA: Diagnosis present

## 2017-07-13 DIAGNOSIS — I252 Old myocardial infarction: Secondary | ICD-10-CM | POA: Diagnosis not present

## 2017-07-13 DIAGNOSIS — I213 ST elevation (STEMI) myocardial infarction of unspecified site: Secondary | ICD-10-CM | POA: Diagnosis present

## 2017-07-13 DIAGNOSIS — R778 Other specified abnormalities of plasma proteins: Secondary | ICD-10-CM

## 2017-07-13 DIAGNOSIS — Z85828 Personal history of other malignant neoplasm of skin: Secondary | ICD-10-CM | POA: Diagnosis not present

## 2017-07-13 DIAGNOSIS — I5043 Acute on chronic combined systolic (congestive) and diastolic (congestive) heart failure: Secondary | ICD-10-CM | POA: Diagnosis present

## 2017-07-13 DIAGNOSIS — N170 Acute kidney failure with tubular necrosis: Secondary | ICD-10-CM | POA: Diagnosis not present

## 2017-07-13 DIAGNOSIS — E10649 Type 1 diabetes mellitus with hypoglycemia without coma: Secondary | ICD-10-CM | POA: Diagnosis not present

## 2017-07-13 DIAGNOSIS — I248 Other forms of acute ischemic heart disease: Secondary | ICD-10-CM | POA: Diagnosis not present

## 2017-07-13 DIAGNOSIS — N2581 Secondary hyperparathyroidism of renal origin: Secondary | ICD-10-CM | POA: Diagnosis present

## 2017-07-13 DIAGNOSIS — E785 Hyperlipidemia, unspecified: Secondary | ICD-10-CM | POA: Diagnosis present

## 2017-07-13 DIAGNOSIS — IMO0001 Reserved for inherently not codable concepts without codable children: Secondary | ICD-10-CM

## 2017-07-13 DIAGNOSIS — Z87891 Personal history of nicotine dependence: Secondary | ICD-10-CM | POA: Diagnosis not present

## 2017-07-13 DIAGNOSIS — Z79899 Other long term (current) drug therapy: Secondary | ICD-10-CM | POA: Diagnosis not present

## 2017-07-13 LAB — TROPONIN I: Troponin I: 3.7 ng/mL (ref <0.03)

## 2017-07-13 LAB — CREATININE, URINE, RANDOM: Creatinine, Urine: 101.41 mg/dL

## 2017-07-13 LAB — BASIC METABOLIC PANEL
Anion gap: 21 — ABNORMAL HIGH (ref 5–15)
BUN: 172 mg/dL — AB (ref 6–20)
CO2: 16 mmol/L — ABNORMAL LOW (ref 22–32)
CREATININE: 10.03 mg/dL — AB (ref 0.61–1.24)
Calcium: 9 mg/dL (ref 8.9–10.3)
Chloride: 97 mmol/L — ABNORMAL LOW (ref 101–111)
GFR calc Af Amer: 5 mL/min — ABNORMAL LOW (ref >60)
GFR, EST NON AFRICAN AMERICAN: 4 mL/min — AB (ref >60)
GLUCOSE: 96 mg/dL (ref 65–99)
POTASSIUM: 4.3 mmol/L (ref 3.5–5.1)
SODIUM: 134 mmol/L — AB (ref 135–145)

## 2017-07-13 LAB — GLUCOSE, CAPILLARY: Glucose-Capillary: 44 mg/dL — CL (ref 65–99)

## 2017-07-13 LAB — CBC
HEMATOCRIT: 29.7 % — AB (ref 39.0–52.0)
Hemoglobin: 9.7 g/dL — ABNORMAL LOW (ref 13.0–17.0)
MCH: 30.3 pg (ref 26.0–34.0)
MCHC: 32.7 g/dL (ref 30.0–36.0)
MCV: 92.8 fL (ref 78.0–100.0)
PLATELETS: 245 10*3/uL (ref 150–400)
RBC: 3.2 MIL/uL — ABNORMAL LOW (ref 4.22–5.81)
RDW: 14.9 % (ref 11.5–15.5)
WBC: 8.3 10*3/uL (ref 4.0–10.5)

## 2017-07-13 LAB — SODIUM, URINE, RANDOM: Sodium, Ur: 32 mmol/L

## 2017-07-13 LAB — CBG MONITORING, ED
Glucose-Capillary: 68 mg/dL (ref 65–99)
Glucose-Capillary: 86 mg/dL (ref 65–99)

## 2017-07-13 MED ORDER — SODIUM CHLORIDE 0.9 % IV SOLN: 100.0000 mL | INTRAVENOUS | Status: DC | PRN

## 2017-07-13 MED ORDER — OMEGA-3-ACID ETHYL ESTERS 1 G PO CAPS
1.0000 g | ORAL_CAPSULE | Freq: Every day | ORAL | Status: DC
  Administered 2017-07-13 – 2017-07-18 (×6): 1 g via ORAL
  Filled 2017-07-13 (×7): qty 1

## 2017-07-13 MED ORDER — CALCITRIOL 0.5 MCG PO CAPS
0.5000 ug | ORAL_CAPSULE | ORAL | Status: DC
  Administered 2017-07-14 – 2017-07-18 (×3): 0.5 ug via ORAL
  Filled 2017-07-13 (×3): qty 1

## 2017-07-13 MED ORDER — ONDANSETRON HCL 4 MG PO TABS: 4.0000 mg | ORAL_TABLET | Freq: Four times a day (QID) | ORAL | Status: DC | PRN

## 2017-07-13 MED ORDER — HEPARIN SODIUM (PORCINE) 1000 UNIT/ML DIALYSIS: 1000.0000 [IU] | INTRAMUSCULAR | Status: DC | PRN

## 2017-07-13 MED ORDER — ACETAMINOPHEN 325 MG PO TABS: 650.0000 mg | ORAL_TABLET | Freq: Four times a day (QID) | ORAL | Status: DC | PRN

## 2017-07-13 MED ORDER — HEPARIN SODIUM (PORCINE) 1000 UNIT/ML DIALYSIS: 40.0000 [IU]/kg | INTRAMUSCULAR | Status: DC | PRN

## 2017-07-13 MED ORDER — INSULIN GLARGINE 100 UNIT/ML ~~LOC~~ SOLN
5.0000 [IU] | Freq: Every day | SUBCUTANEOUS | Status: DC
  Administered 2017-07-13 – 2017-07-15 (×2): 5 [IU] via SUBCUTANEOUS
  Filled 2017-07-13 (×5): qty 0.05

## 2017-07-13 MED ORDER — ATORVASTATIN CALCIUM 20 MG PO TABS
20.0000 mg | ORAL_TABLET | Freq: Every day | ORAL | Status: DC
  Administered 2017-07-13 – 2017-07-18 (×6): 20 mg via ORAL
  Filled 2017-07-13 (×7): qty 1

## 2017-07-13 MED ORDER — ALTEPLASE 2 MG IJ SOLR: 2.0000 mg | Freq: Once | INTRAMUSCULAR | Status: DC | PRN

## 2017-07-13 MED ORDER — LIDOCAINE HCL (PF) 1 % IJ SOLN: 5.0000 mL | INTRAMUSCULAR | Status: DC | PRN

## 2017-07-13 MED ORDER — INSULIN ASPART 100 UNIT/ML ~~LOC~~ SOLN
0.0000 [IU] | Freq: Three times a day (TID) | SUBCUTANEOUS | Status: DC
  Administered 2017-07-15: 2 [IU] via SUBCUTANEOUS
  Administered 2017-07-16 – 2017-07-17 (×2): 1 [IU] via SUBCUTANEOUS
  Administered 2017-07-18: 2 [IU] via SUBCUTANEOUS
  Administered 2017-07-18: 1 [IU] via SUBCUTANEOUS
  Administered 2017-07-18: 07:00:00 2 [IU] via SUBCUTANEOUS
  Administered 2017-07-19: 13:00:00 3 [IU] via SUBCUTANEOUS

## 2017-07-13 MED ORDER — PENTAFLUOROPROP-TETRAFLUOROETH EX AERO: 1.0000 "application " | INHALATION_SPRAY | CUTANEOUS | Status: DC | PRN

## 2017-07-13 MED ORDER — LIDOCAINE-PRILOCAINE 2.5-2.5 % EX CREA: 1.0000 "application " | TOPICAL_CREAM | CUTANEOUS | Status: DC | PRN

## 2017-07-13 MED ORDER — ONDANSETRON HCL 4 MG/2ML IJ SOLN
4.0000 mg | Freq: Four times a day (QID) | INTRAMUSCULAR | Status: DC | PRN
  Administered 2017-07-15: 4 mg via INTRAVENOUS
  Filled 2017-07-13: qty 2

## 2017-07-13 MED ORDER — METOPROLOL SUCCINATE ER 100 MG PO TB24
100.0000 mg | ORAL_TABLET | Freq: Every day | ORAL | Status: DC
  Filled 2017-07-13 (×2): qty 1

## 2017-07-13 MED ORDER — ACETAMINOPHEN 650 MG RE SUPP: 650.0000 mg | Freq: Four times a day (QID) | RECTAL | Status: DC | PRN

## 2017-07-13 MED ORDER — FUROSEMIDE 80 MG PO TABS
80.0000 mg | ORAL_TABLET | Freq: Two times a day (BID) | ORAL | Status: DC
  Administered 2017-07-13 – 2017-07-14 (×3): 80 mg via ORAL
  Filled 2017-07-13 (×2): qty 1
  Filled 2017-07-13: qty 4

## 2017-07-13 MED ORDER — ASPIRIN EC 325 MG PO TBEC
325.0000 mg | DELAYED_RELEASE_TABLET | Freq: Every day | ORAL | Status: DC
  Administered 2017-07-13 – 2017-07-14 (×2): 325 mg via ORAL
  Filled 2017-07-13 (×2): qty 1

## 2017-07-13 MED ORDER — FISH OIL 500 MG PO CAPS: 500.0000 mg | ORAL_CAPSULE | Freq: Every day | ORAL | Status: DC

## 2017-07-13 MED ORDER — INSULIN GLARGINE 100 UNIT/ML SOLOSTAR PEN: 5.0000 [IU] | PEN_INJECTOR | Freq: Every day | SUBCUTANEOUS | Status: DC

## 2017-07-13 MED ORDER — HEPARIN SODIUM (PORCINE) 5000 UNIT/ML IJ SOLN
5000.0000 [IU] | Freq: Three times a day (TID) | INTRAMUSCULAR | Status: DC
  Administered 2017-07-13 – 2017-07-14 (×5): 5000 [IU] via SUBCUTANEOUS
  Filled 2017-07-13 (×4): qty 1

## 2017-07-13 NOTE — ED Notes (Signed)
Pt transported to dialysis

## 2017-07-13 NOTE — ED Notes (Signed)
Checked patient blood sugar it was 68 notified RN Bonnie of blood sugar patient is resting with call bell in reach

## 2017-07-13 NOTE — Progress Notes (Addendum)
PROGRESS NOTE    Douglas Meyer  ZJI:[id number] DOB: 1941/02/28 DOA: 07/13/2017 PCP: Dorena Cookey, MD   Brief Narrative: Douglas Meyer is a 77 y.o.  male with history of chronic kidney disease stage V, CAD, gout, diabetes mellitus, hyperlipidemia, anemia. He presented secondary to elevated creatinine and BUN seen at PCP's office concerning for acute kidney injury. No mental status changes. Nephrology consulted and hemodialysis started.   Assessment & Plan:   Principal Problem:   Renal failure (ARF), acute on chronic (HCC) Active Problems:   Type 1 diabetes mellitus ([place])   Gout   Essential hypertension   Peripheral vascular disease ([place])   Acute kidney injury on CKD 5 Patient previously considered for peritoneal dialysis. Baseline creatinine of about 4.1-4.8. 10.00 on admission with severely elevated BUN. No mental status changes or uremic symptoms -Nephrology recommendations: HD today, continue diuretic -repeat BMP in AM -UOP (strict in/out)  Diabetes mellitus, insulin dependent -Continue SSI and Lantus  Essential hypertension Blood pressure on low side -Continue to hold antihypertensives  Peripheral vascular disease CAD No chest pain. Troponin negative.   Gout Stable.  Anemia Normocytic. Secondary to chronic disease from kidney disease. Stable. No evidence of bleeding. Iron and ferritin within normal limits.   DVT prophylaxis: Heparin subq Code Status: Full code Family Communication: None at bedside Disposition Plan: Pending renal management   Consultants:   Nephrology  Procedures:   Hemodialysis  Antimicrobials:  None    Subjective: No concerns today.  Objective: Vitals:   07/13/17 1500 07/13/17 1530 07/13/17 1600 07/13/17 1630  BP: (!) 96/49 (!) 91/47 (!) 97/49 (!) 100/52  Pulse: 74 74 75 76  Resp: 16 16 16 18   Temp:      TempSrc:      SpO2:      Weight:      Height:       No intake or output data in the 24 hours ending 07/13/17  1651 Filed Weights   07/12/17 1823 07/13/17 1451  Weight: 71.2 kg (157 lb) 68.5 kg (151 lb 0.2 oz)    Examination:  General exam: Appears calm and comfortable    Data Reviewed: I have personally reviewed following labs and imaging studies  CBC: Recent Labs  Lab 07/12/17 0949 07/12/17 1832 07/13/17 0332  WBC 9.9 8.4 8.3  NEUTROABS 7.6  --   --   HGB 10.0* 10.0* 9.7*  HCT 30.1* 30.9* 29.7*  MCV 95.2 93.6 92.8  PLT 224.0 246 175   Basic Metabolic Panel: Recent Labs  Lab 07/12/17 0949 07/12/17 1832 07/13/17 0332  NA 135 135 134*  K 4.6 4.8 4.3  CL 95* 98* 97*  CO2 21 17* 16*  GLUCOSE 110* 93 96  BUN >130* 176* 172*  CREATININE 10.00* 9.99* 10.03*  CALCIUM 9.1 9.4 9.0   GFR: Estimated Creatinine Clearance: 5.6 mL/min (A) (by C-G formula based on SCr of 10.03 mg/dL (H)). Liver Function Tests: No results for input(s): AST, ALT, ALKPHOS, BILITOT, PROT, ALBUMIN in the last 168 hours. No results for input(s): LIPASE, AMYLASE in the last 168 hours. No results for input(s): AMMONIA in the last 168 hours. Coagulation Profile: No results for input(s): INR, PROTIME in the last 168 hours. Cardiac Enzymes: Recent Labs  Lab 07/13/17 0900  TROPONINI 3.70*   BNP (last 3 results) No results for input(s): PROBNP in the last 8760 hours. HbA1C: Recent Labs    07/12/17 0949  HGBA1C 7.6*   CBG: Recent Labs  Lab 07/13/17 0847 07/13/17 1229  GLUCAP 68 86   Lipid Profile: No results for input(s): CHOL, HDL, LDLCALC, TRIG, CHOLHDL, LDLDIRECT in the last 72 hours. Thyroid Function Tests: Recent Labs    07/12/17 0949  TSH 4.66*  FREET4 0.89  T3FREE 3.1   Anemia Panel: Recent Labs    07/12/17 0949  VITAMINB12 >1500*  FERRITIN 255.7  IRON 52   Sepsis Labs: No results for input(s): PROCALCITON, LATICACIDVEN in the last 168 hours.  No results found for this or any previous visit (from the past 240 hour(s)).       Radiology Studies: US Renal  Result  Date: August 02, 2017 CLINICAL DATA:  Acute onset of renal failure. Chronic renal disease. EXAM: RENAL / URINARY TRACT ULTRASOUND COMPLETE COMPARISON:  None. FINDINGS: Right Kidney: Length: 10.8 cm. Echogenicity within normal limits. Diffuse cortical thinning is noted. No mass or hydronephrosis visualized. Left Kidney: Length: 10.0 cm. Echogenicity within normal limits. Diffuse cortical thinning is noted. No mass or hydronephrosis visualized. Bladder: Appears normal for degree of bladder distention. The prostate is borderline enlarged, measuring 4.9 cm in transverse dimension. Small bilateral pleural effusions are noted. IMPRESSION: 1. No evidence of hydronephrosis. 2. Diffuse renal cortical thinning reflects chronic renal disease. 3. Small bilateral pleural effusions. 4. Borderline enlarged prostate. Electronically Signed   By: Garald Balding M.D.   On: 08-02-17 04:47        Scheduled Meds: . aspirin EC  325 mg Oral Daily  . atorvastatin  20 mg Oral q1800  . [START ON 07/14/2017] calcitRIOL  0.5 mcg Oral QODAY  . furosemide  80 mg Oral BID  . heparin  5,000 Units Subcutaneous Q8H  . insulin aspart  0-9 Units Subcutaneous TID WC  . insulin glargine  5 Units Subcutaneous Daily  . metoprolol  100 mg Oral Daily  . omega-3 acid ethyl esters  1 g Oral QHS   Continuous Infusions:   LOS: 0 days     Cordelia Poche, MD [facility] Aug 02, 2017, 4:51 PM Pager: [phone]  If 7PM-7AM, please contact night-coverage [email or web address] Password [facility] 2017-08-02, 4:51 PM

## 2017-07-13 NOTE — H&P (Addendum)
History and Physical    Lux Meaders TZG:[id number] DOB: 1940-08-11 DOA: 07/13/2017  PCP: Dorena Cookey, MD  Patient coming from: Home.  Chief Complaint: Abnormal labs.  HPI: Douglas Meyer is a 77 y.o. male with history of chronic kidney disease stage V, CAD, gout, diabetes mellitus, hyperlipidemia, anemia was referred to the ER after patient's labs revealed creatinine increased to 10 from recent of 4.8 in October 2010.  Patient states that recently his gout medications and diabetic medication and blood pressure medications were adjusted by his insurance company.  Since patient has been feeling poorly he had gone to his primary care.  Denies any chest pain or shortness of but has been having fatigue and weakness.  At primary care's office he had lab work done and was called and stated to come to the ER because his creatinine is markedly elevated along with BUN.  ED Course: In the ER patient denies any chest pain shortness of breath creatinine is found to be around 10 but potassium is within acceptable limits.  UA is unremarkable.  Patient is being admitted for acute on chronic renal failure.  Patient states he still makes urine.  Takes his Lasix twice a day.  Review of Systems: As per HPI, rest all negative.   Past Medical History:  Diagnosis Date  . Bilateral carotid artery disease ([place])   . CAD (coronary artery disease) 05/15/10   out of hospital myocardial infarction in the 1990's. this was picked up on an  EKG in 1997.  Cardiac catherterization demonstrated an occluded vessel and collateras.  I have no description of this.  A stress  perfusion study  done 2008 demonstrated an ejection fraction  of  53% with inferolateral  hypokinesis.  There was an inferolateral defect with  scar and some mild mixed ischemia.   managed rx  . Cancer ([place])    skin  . CKD (chronic kidney disease), stage IV ([place])   . Diabetes mellitus    DM, type II  . ED (erectile dysfunction)   . Gout   . History of  kidney stones   . Hx of cardiovascular stress test    a. Lexiscan myoview 10/18/12: EF 53%, inf and inf-lat scar, slight peri-infarct ischemia, small area of apical ischemia; no change from 2006  . Hyperlipidemia   . Hypertension   . Myocardial infarction ([place])   . Nephrolithiasis   . Polyp of colon   . PVD (peripheral vascular disease) ([place])     Past Surgical History:  Procedure Laterality Date  . AV FISTULA PLACEMENT Right 06/19/2015   Procedure: ARTERIOVENOUS (AV) FISTULA CREATION;  Surgeon: Serafina Mitchell, MD;  Location: [facility];  Service: Vascular;  Laterality: Right;  . BASCILIC VEIN TRANSPOSITION Left 08/11/2016   Procedure: BASCILIC VEIN TRANSPOSITION-LEFT 1ST STAGE;  Surgeon: Serafina Mitchell, MD;  Location: [facility];  Service: Vascular;  Laterality: Left;  . BASCILIC VEIN TRANSPOSITION Left 10/21/2016   Procedure: LEFT ARM 2ND STAGE BASILIC VEIN TRANSPOSITION;  Surgeon: Serafina Mitchell, MD;  Location: [facility] OR;  Service: Vascular;  Laterality: Left;  . COLONOSCOPY W/ POLYPECTOMY    . CYSTOSCOPY     stone removal x 1  . ENDARTERECTOMY Right 11/28/2014   Procedure: RIGHT CAROTID ENDARTERECTOMY;  Surgeon: Serafina Mitchell, MD;  Location: [place];  Service: Vascular;  Laterality: Right;  . EYE SURGERY  06/2013   bilateral cataract surgery  . HUMERUS FRACTURE SURGERY     left  . MOHS SURGERY  multiple  . PATCH ANGIOPLASTY Right 11/28/2014   Procedure: PATCH ANGIOPLASTY RIGHT CAROTID;  Surgeon: Serafina Mitchell, MD;  Location: [facility] OR;  Service: Vascular;  Laterality: Right;     reports that he quit smoking about 44 years ago. He quit after 6.00 years of use. he has never used smokeless tobacco. He reports that he drinks about 4.2 oz of alcohol per week. He reports that he does not use drugs.  No Known Allergies  Family History  Problem Relation Age of Onset  . Heart attack Father        deceased 26  . Diabetes Brother        rheumatic fever, peptic ulcer disease, deceased  . Diabetes  Sister        deceased 3  . Heart attack Mother        deceased age 33s  . Diabetes type II Brother   . Diabetes type II Brother     Prior to Admission medications   Medication Sig Start Date End Date Taking? Authorizing Provider  allopurinol (ZYLOPRIM) 300 MG tablet TAKE ONE TABLET BY MOUTH ONCE DAILY 06/07/17  Yes Dorena Cookey, MD  Ascorbic Acid (VITAMIN C) 500 MG tablet Take 500 mg by mouth at bedtime.    Yes [provider]  aspirin EC 325 MG tablet Take 325 mg by mouth daily.   Yes [provider]  atorvastatin (LIPITOR) 20 MG tablet TAKE ONE TABLET BY MOUTH ONCE DAILY AT  6  PM 05/12/17  Yes Dorena Cookey, MD  benzonatate (TESSALON) 100 MG capsule Take 100 mg by mouth 3 (three) times daily.   Yes [provider]  calcitRIOL (ROCALTROL) 0.5 MCG capsule TAKE 1 CAPSULE BY MOUTH EVERY OTHER DAY 04/03/17  Yes Minus Breeding, MD  furosemide (LASIX) 80 MG tablet Take 1 tablet (80 mg total) by mouth 2 (two) times daily. 08/11/16  Yes Rhyne, Samantha J, PA-C  glipiZIDE (GLUCOTROL) 10 MG tablet Take 1 tablet (10 mg total) by mouth every evening. Patient taking differently: Take 5 mg by mouth 2 (two) times daily before a meal.  10/21/16  Yes Trinh, Janalyn Harder, PA-C  Insulin Glargine (LANTUS SOLOSTAR) 100 UNIT/ML Solostar Pen INJECT 26 UNITS SUBCUTANEOUSLY AT BEDTIME Patient taking differently: Inject 10 Units into the skin daily.  10/21/16  Yes Virgina Jock A, PA-C  metoprolol (TOPROL-XL) 200 MG 24 hr tablet Take 0.5 tablets (100 mg total) by mouth daily. 03/31/17  Yes Minus Breeding, MD  Multiple Vitamins-Minerals (MULTIVITAMIN WITH MINERALS) tablet Take 1 tablet by mouth at bedtime.    Yes [provider]  Omega-3 Fatty Acids (FISH OIL) 500 MG CAPS Take 500 mg by mouth at bedtime.    Yes [provider]  trandolapril (MAVIK) 4 MG tablet Take 2 tablets (8 mg total) by mouth at bedtime. NEED OV. 11/29/16  Yes Minus Breeding, MD  allopurinol  (ZYLOPRIM) 300 MG tablet TAKE 1 BY MOUTH DAILY Patient not taking: Reported on 07/13/2017 06/07/17   Dorena Cookey, MD  amLODipine (NORVASC) 5 MG tablet TAKE ONE TABLET BY MOUTH ONCE DAILY Patient not taking: Reported on 07/13/2017 05/12/17   Dorena Cookey, MD  BD PEN NEEDLE NANO U/F 32G X 4 MM MISC  USE AS DIRECTED DAILY 04/25/17   Dorena Cookey, MD  Lancets (ACCU-CHEK MULTICLIX) lancets 1 each by Other route daily. Dx 250.01 05/07/13   Dorena Cookey, MD    Physical Exam: Vitals:   07/13/17 0130 07/13/17  0200 07/13/17 0230 07/13/17 0300  BP: (!) 109/54 (!) 101/54 (!) 101/56 (!) 99/49  Pulse:  73 72 84  Resp: 13 15 14 18   Temp:      TempSrc:      SpO2:  96% 94% 100%  Weight:      Height:          Constitutional: Moderately built and nourished. Vitals:   07/13/17 0130 07/13/17 0200 07/13/17 0230 07/13/17 0300  BP: (!) 109/54 (!) 101/54 (!) 101/56 (!) 99/49  Pulse:  73 72 84  Resp: 13 15 14 18   Temp:      TempSrc:      SpO2:  96% 94% 100%  Weight:      Height:       Eyes: Anicteric no pallor. ENMT: No discharge from the ears eyes nose or mouth. Neck: No mass felt.  No neck rigidity. Respiratory: No rhonchi or crepitations. Cardiovascular: S1-S2 heard no murmurs appreciated. Abdomen: Soft nontender bowel sounds present. Musculoskeletal: No edema.  No joint effusion. Skin: No rash.  Skin appears warm. Neurologic: Alert awake oriented to time place and person.  Moves all extremities. Psychiatric: Appears normal.  Normal affect.   Labs on Admission: I have personally reviewed following labs and imaging studies  CBC: Recent Labs  Lab 07/12/17 0949 07/12/17 1832  WBC 9.9 8.4  NEUTROABS 7.6  --   HGB 10.0* 10.0*  HCT 30.1* 30.9*  MCV 95.2 93.6  PLT 224.0 130   Basic Metabolic Panel: Recent Labs  Lab 07/12/17 0949 07/12/17 1832  NA 135 135  K 4.6 4.8  CL 95* 98*  CO2 21 17*  GLUCOSE 110* 93  BUN >130* 176*  CREATININE 10.00* 9.99*  CALCIUM 9.1 9.4    GFR: Estimated Creatinine Clearance: 5.6 mL/min (A) (by C-G formula based on SCr of 9.99 mg/dL (H)). Liver Function Tests: No results for input(s): AST, ALT, ALKPHOS, BILITOT, PROT, ALBUMIN in the last 168 hours. No results for input(s): LIPASE, AMYLASE in the last 168 hours. No results for input(s): AMMONIA in the last 168 hours. Coagulation Profile: No results for input(s): INR, PROTIME in the last 168 hours. Cardiac Enzymes: No results for input(s): CKTOTAL, CKMB, CKMBINDEX, TROPONINI in the last 168 hours. BNP (last 3 results) No results for input(s): PROBNP in the last 8760 hours. HbA1C: Recent Labs    07/12/17 0949  HGBA1C 7.6*   CBG: No results for input(s): GLUCAP in the last 168 hours. Lipid Profile: No results for input(s): CHOL, HDL, LDLCALC, TRIG, CHOLHDL, LDLDIRECT in the last 72 hours. Thyroid Function Tests: Recent Labs    07/12/17 0949  TSH 4.66*  FREET4 0.89  T3FREE 3.1   Anemia Panel: Recent Labs    07/12/17 0949  VITAMINB12 >1500*  FERRITIN 255.7  IRON 52   Urine analysis:    Component Value Date/Time   COLORURINE YELLOW 07/12/2017 2104   APPEARANCEUR CLEAR 07/12/2017 2104   LABSPEC 1.011 07/12/2017 2104   PHURINE 5.0 07/12/2017 2104   GLUCOSEU NEGATIVE 07/12/2017 2104   HGBUR NEGATIVE 07/12/2017 2104   HGBUR negative 06/28/2010 0834   BILIRUBINUR NEGATIVE 07/12/2017 2104   BILIRUBINUR n 04/19/2016 1205   KETONESUR NEGATIVE 07/12/2017 2104   PROTEINUR NEGATIVE 07/12/2017 2104   UROBILINOGEN 0.2 04/19/2016 1205   UROBILINOGEN 0.2 11/20/2014 1509   NITRITE NEGATIVE 07/12/2017 2104   LEUKOCYTESUR NEGATIVE 07/12/2017 2104   Sepsis Labs: @LABRCNTIP (procalcitonin:4,lacticidven:4) )No results found for this or any previous visit (from the past 240 hour(s)).  Radiological Exams on Admission: No results found.  EKG: Independently reviewed.  Normal sinus rhythm with LVH changes.  Change from previous EKG.  Assessment/Plan Principal  Problem:   Renal failure (ARF), acute on chronic (HCC) Active Problems:   Type 1 diabetes mellitus ([place])   Gout   Essential hypertension   Peripheral vascular disease ([place])    1. Acute on chronic kidney disease stage V -I have discussed with Dr. Posey Pronto on-call nephrologist will be seeing patient in consult.  For now no change in patient's diuretic dose.  But will hold patient's ACE inhibitor.  Renal sonogram has been ordered to rule out obstruction.  FENa pending.  Closely follow intake output respiratory status metabolic panel.  Likely may need dialysis during this admission.  Patient already has a AV fistula. 2. Diabetes mellitus type 2 -since patient has worsening renal function will hold oral hypoglycemics and keep patient on sliding scale and decrease patient's long-acting insulin dose by half.  Closely follow CBGs. 3. Hypertension presently patient is having low normal blood pressure.  Will hold amlodipine due to low normal blood pressure.  Will hold ACE inhibitor due to worsening renal function and also closely monitor blood pressure trends.  Patient is on metoprolol. 4. History of CAD denies any chest pain.  Since patient has some new EKG changes which may be from LVH but since patient is complaining of weakness will check troponin. 5. Anemia likely from renal disease follow CBC. 6. History of gout has been stopped allopurinol recently.   DVT prophylaxis: Heparin. Code Status: Full code. Family Communication: Discussed with patient. Disposition Plan: Home. Consults called: Nephrology. Admission status: Inpatient.   Rise Patience MD [facility] Pager [phone].  If 7PM-7AM, please contact night-coverage [email or web address] Password [email or web address]  07/13/2017, 3:35 AM

## 2017-07-13 NOTE — ED Notes (Signed)
Pt unable to eat much lunch but drank some. No complaints at the this time

## 2017-07-13 NOTE — Progress Notes (Signed)
Change of shift rounds, hearing aids given to patient. Were placed in shoes in container  When returned from hemo. Patient handed container to spouse.Witnessed by this RN and [employer] manager.

## 2017-07-13 NOTE — Consult Note (Signed)
[facility] Consult Note     Date: 07/13/2017                  Patient Name:  Douglas Meyer  MRN: [id number]  DOB: 12/18/1940  Age / Sex: 77 y.o., male         PCP: Dorena Cookey, MD                 Service Requesting Consult: TRH                 Reason for Consult: Uremia             Chief Complaint:  Referred from PCP office 2/2 elevated Cr  HPI:  Douglas Meyer is a 77 y.o. M with PMH significant for CKD V followed by Dr. Jimmy Footman as an outpatient likely 2/2 DM and HTN, CAD, gout, DM, HLD, anemia who presented from PCP's office 2/2 elevated Cr. He reports he was feeling normally until Monday when a doctor from his insurance came to his home and adjusted his medications. Additionally, he took 2 advil Monday. He awoke Tuesday feeling badly. States his CBG was elevated Tuesday. Endorses confusion, endorses decreased appetite, denies N/V, denies CP, denies SOB, denies itching. States he makes normally urinates several times per day. Less UOP this week, just once per day. Takes QD lasix at home. No increased UOP with lasix this week. Failed fistula in RUE, now with working fistula in LUE. Discussed peritoneal HD with Dr. Jimmy Footman, had home eval for this last week. Reports home BPs systolic 732K.   Past Medical History:  Diagnosis Date  . Bilateral carotid artery disease ([place])   . CAD (coronary artery disease) 05/15/10   out of hospital myocardial infarction in the 1990's. this was picked up on an  EKG in 1997.  Cardiac catherterization demonstrated an occluded vessel and collateras.  I have no description of this.  A stress  perfusion study  done 2008 demonstrated an ejection fraction  of  53% with inferolateral  hypokinesis.  There was an inferolateral defect with  scar and some mild mixed ischemia.   managed rx  . Cancer ([place])    skin  . CKD (chronic kidney disease), stage IV ([place])   . Diabetes mellitus    DM, type II  . ED (erectile dysfunction)   . Gout   .  History of kidney stones   . Hx of cardiovascular stress test    a. Lexiscan myoview 10/18/12: EF 53%, inf and inf-lat scar, slight peri-infarct ischemia, small area of apical ischemia; no change from 2006  . Hyperlipidemia   . Hypertension   . Myocardial infarction ([place])   . Nephrolithiasis   . Polyp of colon   . PVD (peripheral vascular disease) ([place])     Past Surgical History:  Procedure Laterality Date  . AV FISTULA PLACEMENT Right 06/19/2015   Procedure: ARTERIOVENOUS (AV) FISTULA CREATION;  Surgeon: Serafina Mitchell, MD;  Location: [facility];  Service: Vascular;  Laterality: Right;  . BASCILIC VEIN TRANSPOSITION Left 08/11/2016   Procedure: BASCILIC VEIN TRANSPOSITION-LEFT 1ST STAGE;  Surgeon: Serafina Mitchell, MD;  Location: [facility];  Service: Vascular;  Laterality: Left;  . BASCILIC VEIN TRANSPOSITION Left 10/21/2016   Procedure: LEFT ARM 2ND STAGE BASILIC VEIN TRANSPOSITION;  Surgeon: Serafina Mitchell, MD;  [facility];  Service: Vascular;  Laterality: Left;  . COLONOSCOPY W/ POLYPECTOMY    . CYSTOSCOPY     stone removal x  1  . ENDARTERECTOMY Right 11/28/2014   Procedure: RIGHT CAROTID ENDARTERECTOMY;  Surgeon: Serafina Mitchell, MD;  Location: [facility];  Service: Vascular;  Laterality: Right;  . EYE SURGERY  06/2013   bilateral cataract surgery  . HUMERUS FRACTURE SURGERY     left  . MOHS SURGERY     multiple  . PATCH ANGIOPLASTY Right 11/28/2014   Procedure: PATCH ANGIOPLASTY RIGHT CAROTID;  Surgeon: Serafina Mitchell, MD;  Location: [facility] OR;  Service: Vascular;  Laterality: Right;    Family History  Problem Relation Age of Onset  . Heart attack Father        deceased 19  . Diabetes Brother        rheumatic fever, peptic ulcer disease, deceased  . Diabetes Sister        deceased 65  . Heart attack Mother        deceased age 45s  . Diabetes type II Brother   . Diabetes type II Brother    Social History:  reports that he quit smoking about 44 years ago. He quit after 6.00 years of  use. he has never used smokeless tobacco. He reports that he drinks about 4.2 oz of alcohol per week. He reports that he does not use drugs.  Allergies: No Known Allergies   (Not in a hospital admission)  Results for orders placed or performed during the hospital encounter of 07/13/17 (from the past 48 hour(s))  Basic metabolic panel     Status: Abnormal   Collection Time: 07/12/17  6:32 PM  Result Value Ref Range   Sodium 135 135 - 145 mmol/L   Potassium 4.8 3.5 - 5.1 mmol/L   Chloride 98 (L) 101 - 111 mmol/L   CO2 17 (L) 22 - 32 mmol/L   Glucose, Bld 93 65 - 99 mg/dL   BUN 176 (H) 6 - 20 mg/dL   Creatinine, Ser 9.99 (H) 0.61 - 1.24 mg/dL   Calcium 9.4 8.9 - 10.3 mg/dL   GFR calc non Af Amer 4 (L) >60 mL/min   GFR calc Af Amer 5 (L) >60 mL/min    Comment: (NOTE) The eGFR has been calculated using the CKD EPI equation. This calculation has not been validated in all clinical situations. eGFR's persistently <60 mL/min signify possible Chronic Kidney Disease.    Anion gap 20 (H) 5 - 15    Comment: Performed at [facility], [place] [address] [place], [place] [address]  CBC     Status: Abnormal   Collection Time: 07/12/17  6:32 PM  Result Value Ref Range   WBC 8.4 4.0 - 10.5 K/uL   RBC 3.30 (L) 4.22 - 5.81 MIL/uL   Hemoglobin 10.0 (L) 13.0 - 17.0 g/dL   HCT 30.9 (L) 39.0 - 52.0 %   MCV 93.6 78.0 - 100.0 fL   MCH 30.3 26.0 - 34.0 pg   MCHC 32.4 30.0 - 36.0 g/dL   RDW 15.2 11.5 - 15.5 %   Platelets 246 150 - 400 K/uL    Comment: Performed at [facility], [place] [address], [place], [place] [address]  Urinalysis, Routine w reflex microscopic- may I&O cath if menses     Status: None   Collection Time: 07/12/17  9:04 PM  Result Value Ref Range   Color, Urine YELLOW YELLOW   APPearance CLEAR CLEAR   Specific Gravity, Urine 1.011 1.005 - 1.030   pH 5.0 5.0 - 8.0   Glucose, UA NEGATIVE NEGATIVE  mg/dL   Hgb urine dipstick NEGATIVE NEGATIVE   Bilirubin Urine NEGATIVE  NEGATIVE   Ketones, ur NEGATIVE NEGATIVE mg/dL   Protein, ur NEGATIVE NEGATIVE mg/dL   Nitrite NEGATIVE NEGATIVE   Leukocytes, UA NEGATIVE NEGATIVE    Comment: Performed at [facility] [address], [place], [place] [address]  SODIUM, URINE, RANDOM     Status: None   Collection Time: 07/12/17  9:04 PM  Result Value Ref Range   Sodium, Ur 32 mmol/L    Comment: Performed at [facility] [address] [place], [place] [address]  Creatinine, urine, random     Status: None   Collection Time: 07/12/17  9:04 PM  Result Value Ref Range   Creatinine, Urine 101.41 mg/dL    Comment: Performed at [facility] [address], [place], [place] [address]  Basic metabolic panel     Status: Abnormal   Collection Time: 07/13/17  3:32 AM  Result Value Ref Range   Sodium 134 (L) 135 - 145 mmol/L   Potassium 4.3 3.5 - 5.1 mmol/L   Chloride 97 (L) 101 - 111 mmol/L   CO2 16 (L) 22 - 32 mmol/L   Glucose, Bld 96 65 - 99 mg/dL   BUN 172 (H) 6 - 20 mg/dL   Creatinine, Ser 10.03 (H) 0.61 - 1.24 mg/dL   Calcium 9.0 8.9 - 10.3 mg/dL   GFR calc non Af Amer 4 (L) >60 mL/min   GFR calc Af Amer 5 (L) >60 mL/min    Comment: (NOTE) The eGFR has been calculated using the CKD EPI equation. This calculation has not been validated in all clinical situations. eGFR's persistently <60 mL/min signify possible Chronic Kidney Disease.    Anion gap 21 (H) 5 - 15    Comment: RESULT CHECKED Performed at [facility], [place] [address] [place], [place] [address]   CBC     Status: Abnormal   Collection Time: 07/13/17  3:32 AM  Result Value Ref Range   WBC 8.3 4.0 - 10.5 K/uL   RBC 3.20 (L) 4.22 - 5.81 MIL/uL   Hemoglobin 9.7 (L) 13.0 - 17.0 g/dL   HCT 29.7 (L) 39.0 - 52.0 %   MCV 92.8 78.0 - 100.0 fL   MCH 30.3 26.0 - 34.0 pg   MCHC 32.7 30.0 - 36.0 g/dL   RDW 14.9 11.5 - 15.5 %   Platelets 245 150 - 400 K/uL    Comment: Performed at [facility], [place] [address], [place],  [place] [address]  CBG monitoring, ED     Status: None   Collection Time: 07/13/17  8:47 AM  Result Value Ref Range   Glucose-Capillary 68 65 - 99 mg/dL  Troponin I     Status: Abnormal   Collection Time: 07/13/17  9:00 AM  Result Value Ref Range   Troponin I 3.70 (HH) <0.03 ng/mL    Comment: CRITICAL RESULT CALLED TO, READ BACK BY AND VERIFIED WITH: SHANAS,B RN @ 1018 07/13/17 LEONARD,A Performed at [facility], [place] [address], [place], [place] [address]    US Renal  Result Date: 07/13/2017 CLINICAL DATA:  Acute onset of renal failure. Chronic renal disease. EXAM: RENAL / URINARY TRACT ULTRASOUND COMPLETE COMPARISON:  None. FINDINGS: Right Kidney: Length: 10.8 cm. Echogenicity within normal limits. Diffuse cortical thinning is noted. No mass or hydronephrosis visualized. Left Kidney: Length: 10.0 cm. Echogenicity within normal limits. Diffuse cortical thinning is noted.  No mass or hydronephrosis visualized. Bladder: Appears normal for degree of bladder distention. The prostate is borderline enlarged, measuring 4.9 cm in transverse dimension. Small bilateral pleural effusions are noted. IMPRESSION: 1. No evidence of hydronephrosis. 2. Diffuse renal cortical thinning reflects chronic renal disease. 3. Small bilateral pleural effusions. 4. Borderline enlarged prostate. Electronically Signed   By: Garald Balding M.D.   On: 07/13/2017 04:47    Review of Systems  Constitutional: Negative for chills, fever and weight loss.  Eyes: Negative for blurred vision and double vision.  Respiratory: Negative for shortness of breath and wheezing.   Cardiovascular: Negative for chest pain, palpitations, leg swelling and PND.  Gastrointestinal: Negative for heartburn, nausea and vomiting.  Genitourinary: Negative for frequency and urgency.  Musculoskeletal: Negative for myalgias.  Skin: Negative for itching.  Neurological: Negative for dizziness, sensory change, speech change, focal weakness and headaches.   Psychiatric/Behavioral: Negative for hallucinations.   Wt Readings from Last 3 Encounters:  07/12/17 157 lb (71.2 kg)  07/12/17 157 lb (71.2 kg)  05/18/17 154 lb (69.9 kg)    Blood pressure (!) 95/51, pulse 80, temperature 97.6 F (36.4 C), resp. rate 20, height 5' 6"  (1.676 m), weight 157 lb (71.2 kg), SpO2 94 %. Physical Exam  Constitutional: He is oriented to person, place, and time. He appears well-developed and well-nourished. No distress.  HENT:  Head: Normocephalic and atraumatic.  Eyes: Conjunctivae and EOM are normal. Pupils are equal, round, and reactive to light. No scleral icterus.  Neck: Normal range of motion. Neck supple.  Cardiovascular: Normal rate and regular rhythm. Exam reveals no friction rub.  No murmur heard. Respiratory: Effort normal and breath sounds normal. He has no rales.  GI: Soft. Bowel sounds are normal. There is no tenderness.  Musculoskeletal: Normal range of motion. He exhibits no edema.  Neurological: He is alert and oriented to person, place, and time. No cranial nerve deficit.  Skin: Skin is warm and dry. No rash noted.  Psychiatric: He has a normal mood and affect. His behavior is normal. Judgment and thought content normal.     Assessment/Plan Uremia - BUN now 176 from 81 recently. Endorses dysguesia, decreased appetite. Likely 2/2 below.  -plan for HD today Acute on Chronic Kidney Disease stage V - last labs at [facility] Cr 4.80, BUN 81 from 06/27/17. Negative UA on admit. Cr now 10.03. CKD 2/2 HTN and DM. Acute injury possibly secondary to NSAID intake in the face of poor renal reserve. Was planning for possible PD with Dr. Jimmy Footman.  -HD as above -fistula matured (placed 5/18) HTN - relative hypotension. On lasix, norvasc, trandolopril, metoprolol at home. -hold lasix, ACEi, norvasc  DM - per primary.  CAD - primary team checking troponin 2/2 EKG changes possible LVH.  Anemia - Iron studies incomplete, will get TIBC and sat on labs.   -consider IV iron pending above results as Hgb <10 Hx of gout - stopped allopurinol recently.    Ralene Ok, MD 07/13/17

## 2017-07-13 NOTE — ED Provider Notes (Signed)
Patient seen/examined in the Emergency Department in conjunction with Midlevel Provider [facility] Patient reports generalized weakness, worsening renal failure Exam : Awake alert, no distress Plan: Consult nephrology   Ripley Fraise, MD 07/13/17 [phone]

## 2017-07-13 NOTE — ED Notes (Signed)
Breakfast finally delivered to room.

## 2017-07-13 NOTE — ED Notes (Signed)
Gave patient some apple juice per doctor

## 2017-07-13 NOTE — Progress Notes (Signed)
Patient arrived to the floor, V/S stable, assessment charted, no complaints, dinner tray ordered. States he is missing his hearing aids. Notified ED, hemo, and 5C.

## 2017-07-13 NOTE — ED Notes (Signed)
[place] [phone]-per RN

## 2017-07-13 NOTE — ED Provider Notes (Signed)
[facility] EMERGENCY DEPARTMENT Provider Note   CSN: [id number] Arrival date & time: 07/12/17  1628     History   Chief Complaint Chief Complaint  Patient presents with  . Sent by Provider  . Acute Renal Failure    HPI Douglas Meyer is a 77 y.o. male.  The history is provided by the patient and medical records.    77 y.o. M with hx of CAD, skin cancer, CKD stage IV, prepped for HD but not get getting dialyzed, HTN, HLP, PVD, presenting to the ED with worsening renal failure.  States he was seen by his PCP earlier in the week for routine lab draw as he was not feeling well.  States he just feels "lousy".  No bodily pain, chest pain, SOB, cough, fever, chills, or other infectious symptoms.  States his urine output has been poor, but actually improved a little yesterday.  States his SrCr has remained around 4 so they have been monitoring closely but was told he was not at the point of needing dialysis just yet.  States he plans to get set up for home peritoneal dialysis.  He is followed by nephrology, Dr. Jimmy Footman.  Past Medical History:  Diagnosis Date  . Bilateral carotid artery disease ([place])   . CAD (coronary artery disease) 05/15/10   out of hospital myocardial infarction in the 1990's. this was picked up on an  EKG in 1997.  Cardiac catherterization demonstrated an occluded vessel and collateras.  I have no description of this.  A stress  perfusion study  done 2008 demonstrated an ejection fraction  of  53% with inferolateral  hypokinesis.  There was an inferolateral defect with  scar and some mild mixed ischemia.   managed rx  . Cancer ([place])    skin  . CKD (chronic kidney disease), stage IV ([place])   . Diabetes mellitus    DM, type II  . ED (erectile dysfunction)   . Gout   . History of kidney stones   . Hx of cardiovascular stress test    a. Lexiscan myoview 10/18/12: EF 53%, inf and inf-lat scar, slight peri-infarct ischemia, small area of apical ischemia;  no change from 2006  . Hyperlipidemia   . Hypertension   . Myocardial infarction ([place])   . Nephrolithiasis   . Polyp of colon   . PVD (peripheral vascular disease) [facility])     Patient Active Problem List   Diagnosis Date Noted  . Fatigue associated with anemia 07/12/2017  . Sensorineural hearing loss (SNHL) of both ears 01/10/2017  . Asymptomatic stenosis of right carotid artery 11/28/2014  . Carotid bruit 09/22/2014  . Onychomycosis 10/09/2012  . Gout 10/02/2008  . ERECTILE DYSFUNCTION 08/16/2007  . MYOCARDIAL INFARCTION 08/16/2007  . NEPHROLITHIASIS, HX OF 08/16/2007  . POLYP, COLON 03/29/2007  . Type 1 diabetes mellitus ([place]) 02/01/2007  . HYPERLIPIDEMIA 02/01/2007  . Essential hypertension 02/01/2007  . Coronary atherosclerosis 02/01/2007  . Peripheral vascular disease ([place]) 02/01/2007  . Disorder resulting from impaired renal function 02/01/2007    Past Surgical History:  Procedure Laterality Date  . AV FISTULA PLACEMENT Right 06/19/2015   Procedure: ARTERIOVENOUS (AV) FISTULA CREATION;  Surgeon: Serafina Mitchell, MD;  Location: [facility];  Service: Vascular;  Laterality: Right;  . BASCILIC VEIN TRANSPOSITION Left 08/11/2016   Procedure: BASCILIC VEIN TRANSPOSITION-LEFT 1ST STAGE;  Surgeon: Serafina Mitchell, MD;  Location: [facility];  Service: Vascular;  Laterality: Left;  . BASCILIC VEIN TRANSPOSITION Left 10/21/2016  Procedure: LEFT ARM 2ND STAGE BASILIC VEIN TRANSPOSITION;  Surgeon: Serafina Mitchell, MD;  [facility];  Service: Vascular;  Laterality: Left;  . COLONOSCOPY W/ POLYPECTOMY    . CYSTOSCOPY     stone removal x 1  . ENDARTERECTOMY Right 11/28/2014   Procedure: RIGHT CAROTID ENDARTERECTOMY;  Surgeon: Serafina Mitchell, MD;  Location: [facility];  Service: Vascular;  Laterality: Right;  . EYE SURGERY  06/2013   bilateral cataract surgery  . HUMERUS FRACTURE SURGERY     left  . MOHS SURGERY     multiple  . PATCH ANGIOPLASTY Right 11/28/2014   Procedure: PATCH ANGIOPLASTY  RIGHT CAROTID;  Surgeon: Serafina Mitchell, MD;  Location: [facility] OR;  Service: Vascular;  Laterality: Right;       Home Medications    Prior to Admission medications   Medication Sig Start Date End Date Taking? Authorizing Provider  allopurinol (ZYLOPRIM) 300 MG tablet TAKE ONE TABLET BY MOUTH ONCE DAILY 06/07/17   Dorena Cookey, MD  allopurinol (ZYLOPRIM) 300 MG tablet TAKE 1 BY MOUTH DAILY Patient taking differently: TAKE 1 BY MOUTH DAILY 06/07/17   Dorena Cookey, MD  amLODipine (NORVASC) 5 MG tablet TAKE ONE TABLET BY MOUTH ONCE DAILY Patient taking differently: TAKE ONE TABLET BY MOUTH ONCE DAILY On Hold 05/12/17   Dorena Cookey, MD  Ascorbic Acid (VITAMIN C) 500 MG tablet Take 500 mg by mouth at bedtime.     [provider]  aspirin 81 MG tablet Take 81 mg by mouth at bedtime.     [provider]  atorvastatin (LIPITOR) 20 MG tablet TAKE ONE TABLET BY MOUTH ONCE DAILY AT  6  PM 05/12/17   Dorena Cookey, MD  BD PEN NEEDLE NANO U/F 32G X 4 MM MISC  USE AS DIRECTED DAILY 04/25/17   Dorena Cookey, MD  calcitRIOL (ROCALTROL) 0.5 MCG capsule TAKE 1 CAPSULE BY MOUTH EVERY OTHER DAY 04/03/17   Minus Breeding, MD  furosemide (LASIX) 80 MG tablet Take 1 tablet (80 mg total) by mouth 2 (two) times daily. 08/11/16   Rhyne, Hulen Shouts, PA-C  glipiZIDE (GLUCOTROL) 10 MG tablet Take 1 tablet (10 mg total) by mouth every evening. 10/21/16   Alvia Grove, PA-C  hydrocortisone 2.5 % cream Apply 1 application topically every other day. Apply to scalp    [provider]  Insulin Glargine (LANTUS SOLOSTAR) 100 UNIT/ML Solostar Pen INJECT 26 UNITS SUBCUTANEOUSLY AT BEDTIME Patient taking differently: INJECT 26 UNITS SUBCUTANEOUSLY AT BEDTIME 10/21/16   Virgina Jock A, PA-C  Lancets (ACCU-CHEK MULTICLIX) lancets 1 each by Other route daily. Dx 250.01 05/07/13   Dorena Cookey, MD  metoprolol (TOPROL-XL) 200 MG 24 hr tablet Take 0.5 tablets (100 mg total) by mouth daily.  03/31/17   Minus Breeding, MD  Multiple Vitamins-Minerals (MULTIVITAMIN WITH MINERALS) tablet Take 1 tablet by mouth at bedtime.     [provider]  Omega-3 Fatty Acids (FISH OIL) 500 MG CAPS Take 500 mg by mouth at bedtime.     [provider]  trandolapril (MAVIK) 4 MG tablet Take 2 tablets (8 mg total) by mouth at bedtime. NEED OV. 11/29/16   Minus Breeding, MD    Family History Family History  Problem Relation Age of Onset  . Heart attack Father        deceased 33  . Diabetes Brother        rheumatic fever, peptic ulcer disease, deceased  .  Diabetes Sister        deceased 58  . Heart attack Mother        deceased age 37s  . Diabetes type II Brother   . Diabetes type II Brother     Social History Social History   Tobacco Use  . Smoking status: Former Smoker    Years: 6.00    Last attempt to quit: 06/06/1973    Years since quitting: 44.1  . Smokeless tobacco: Never Used  Substance Use Topics  . Alcohol use: Yes    Alcohol/week: 4.2 oz    Types: 7 Cans of beer per week    Comment: 1 beer daily  . Drug use: No     Allergies   Patient has no known allergies.   Review of Systems Review of Systems  Constitutional:       Abnormal labs  All other systems reviewed and are negative.    Physical Exam Updated Vital Signs BP (!) 111/51   Pulse 68   Temp 97.6 F (36.4 C)   Resp 17   Ht 5\' 6"  (1.676 m)   Wt 71.2 kg (157 lb)   SpO2 96%   BMI 25.34 kg/m   Physical Exam  Constitutional: He is oriented to person, place, and time. He appears well-developed and well-nourished.  HENT:  Head: Normocephalic and atraumatic.  Mouth/Throat: Oropharynx is clear and moist.  Eyes: Conjunctivae and EOM are normal. Pupils are equal, round, and reactive to light.  Neck: Normal range of motion.  Cardiovascular: Normal rate, regular rhythm and normal heart sounds.  Pulmonary/Chest: Effort normal and breath sounds normal. No stridor. No respiratory distress.    Abdominal: Soft. Bowel sounds are normal. There is no tenderness. There is no rebound.  Musculoskeletal: Normal range of motion.  Fistula LUE with thrill noted  Neurological: He is alert and oriented to person, place, and time.  Skin: Skin is warm and dry.  Psychiatric: He has a normal mood and affect.  Nursing note and vitals reviewed.    ED Treatments / Results  Labs (all labs ordered are listed, but only abnormal results are displayed) Labs Reviewed  BASIC METABOLIC PANEL - Abnormal; Notable for the following components:      Result Value   Chloride 98 (*)    CO2 17 (*)    BUN 176 (*)    Creatinine, Ser 9.99 (*)    GFR calc non Af Amer 4 (*)    GFR calc Af Amer 5 (*)    Anion gap 20 (*)    All other components within normal limits  CBC - Abnormal; Notable for the following components:   RBC 3.30 (*)    Hemoglobin 10.0 (*)    HCT 30.9 (*)    All other components within normal limits  URINALYSIS, ROUTINE W REFLEX MICROSCOPIC    EKG  EKG Interpretation  Date/Time:  Thursday July 13 2017 01:03:19 EST Ventricular Rate:  75 PR Interval:    QRS Duration: 110 QT Interval:  407 QTC Calculation: 455 R Axis:   70 Text Interpretation:  Sinus rhythm Probable left atrial enlargement LVH with secondary repolarization abnormality Baseline wander in lead(s) V3 Confirmed by Ripley Fraise [phone]) on 07/13/2017 1:13:21 AM       Radiology No results found.  Procedures Procedures (including critical care time)  Medications Ordered in ED Medications - No data to display   Initial Impression / Assessment and Plan / ED Course  I have reviewed the triage  vital signs and the nursing notes.  Pertinent labs & imaging results that were available during my care of the patient were reviewed by me and considered in my medical decision making (see chart for details).  77 y.o. M presenting to the ED with acute of chronic renal failure.  Most recent SrCr in the 4's range, today  SrCr is 10.  Has AV fistula in place for dialysis needs, however has not yet been started.  States he was planning for home peritoneal dialysis when/if needed.  Patient states he feels "lousy".  Decreased urine output.  At neurologic baseline.  Hemodynamically stable.  K+ stable.  Discussed with nephrology, Dr. Augustin Coupe-- he will see in consult.    Patient will be admitted to medicine service for ongoing care.  Final Clinical Impressions(s) / ED Diagnoses   Final diagnoses:  Acute renal failure superimposed on stage 4 chronic kidney disease, unspecified acute renal failure type [facility])    ED Discharge Orders    None       Larene Pickett, PA-C 07/13/17 0316    Ripley Fraise, MD 07/13/17 [phone]

## 2017-07-14 DIAGNOSIS — I10 Essential (primary) hypertension: Secondary | ICD-10-CM

## 2017-07-14 DIAGNOSIS — R9431 Abnormal electrocardiogram [ECG] [EKG]: Secondary | ICD-10-CM

## 2017-07-14 DIAGNOSIS — Z794 Long term (current) use of insulin: Secondary | ICD-10-CM

## 2017-07-14 DIAGNOSIS — N184 Chronic kidney disease, stage 4 (severe): Secondary | ICD-10-CM

## 2017-07-14 DIAGNOSIS — R748 Abnormal levels of other serum enzymes: Secondary | ICD-10-CM

## 2017-07-14 DIAGNOSIS — E119 Type 2 diabetes mellitus without complications: Secondary | ICD-10-CM

## 2017-07-14 LAB — RENAL FUNCTION PANEL
ALBUMIN: 2.7 g/dL — AB (ref 3.5–5.0)
Anion gap: 17 — ABNORMAL HIGH (ref 5–15)
BUN: 107 mg/dL — AB (ref 6–20)
CALCIUM: 8.7 mg/dL — AB (ref 8.9–10.3)
CO2: 21 mmol/L — ABNORMAL LOW (ref 22–32)
CREATININE: 7.72 mg/dL — AB (ref 0.61–1.24)
Chloride: 98 mmol/L — ABNORMAL LOW (ref 101–111)
GFR, EST AFRICAN AMERICAN: 7 mL/min — AB (ref >60)
GFR, EST NON AFRICAN AMERICAN: 6 mL/min — AB (ref >60)
Glucose, Bld: 110 mg/dL — ABNORMAL HIGH (ref 65–99)
PHOSPHORUS: 6.3 mg/dL — AB (ref 2.5–4.6)
Potassium: 4 mmol/L (ref 3.5–5.1)
SODIUM: 136 mmol/L (ref 135–145)

## 2017-07-14 LAB — IRON AND TIBC
Iron: 32 ug/dL — ABNORMAL LOW (ref 45–182)
SATURATION RATIOS: 14 % — AB (ref 17.9–39.5)
TIBC: 230 ug/dL — AB (ref 250–450)
UIBC: 198 ug/dL

## 2017-07-14 LAB — GLUCOSE, CAPILLARY
GLUCOSE-CAPILLARY: 129 mg/dL — AB (ref 65–99)
GLUCOSE-CAPILLARY: 69 mg/dL (ref 65–99)
GLUCOSE-CAPILLARY: 71 mg/dL (ref 65–99)
GLUCOSE-CAPILLARY: 82 mg/dL (ref 65–99)
GLUCOSE-CAPILLARY: 87 mg/dL (ref 65–99)
Glucose-Capillary: 128 mg/dL — ABNORMAL HIGH (ref 65–99)
Glucose-Capillary: 88 mg/dL (ref 65–99)
Glucose-Capillary: 57 mg/dL — ABNORMAL LOW (ref 65–99)

## 2017-07-14 LAB — CBC
HCT: 28.6 % — ABNORMAL LOW (ref 39.0–52.0)
Hemoglobin: 9.4 g/dL — ABNORMAL LOW (ref 13.0–17.0)
MCH: 30.9 pg (ref 26.0–34.0)
MCHC: 32.9 g/dL (ref 30.0–36.0)
MCV: 94.1 fL (ref 78.0–100.0)
PLATELETS: 223 10*3/uL (ref 150–400)
RBC: 3.04 MIL/uL — AB (ref 4.22–5.81)
RDW: 15.5 % (ref 11.5–15.5)
WBC: 6 10*3/uL (ref 4.0–10.5)

## 2017-07-14 LAB — TROPONIN I: Troponin I: 4.4 ng/mL (ref <0.03)

## 2017-07-14 LAB — HEPATITIS B CORE ANTIBODY, TOTAL: Hep B Core Total Ab: NEGATIVE

## 2017-07-14 LAB — HEPATITIS B SURFACE ANTIGEN: HEP B S AG: NEGATIVE

## 2017-07-14 LAB — HEPATITIS B E ANTIGEN: HEP B E AG: NEGATIVE

## 2017-07-14 MED ORDER — ASPIRIN 81 MG PO CHEW
81.0000 mg | CHEWABLE_TABLET | Freq: Every day | ORAL | Status: DC
Start: 2017-07-15 — End: 2017-07-19
  Administered 2017-07-15 – 2017-07-19 (×5): 81 mg via ORAL
  Filled 2017-07-14 (×5): qty 1

## 2017-07-14 MED ORDER — HEPARIN (PORCINE) IN NACL 100-0.45 UNIT/ML-% IJ SOLN
1100.0000 [IU]/h | INTRAMUSCULAR | Status: DC
  Administered 2017-07-15 – 2017-07-16 (×2): 800 [IU]/h via INTRAVENOUS
  Filled 2017-07-14 (×3): qty 250

## 2017-07-14 MED ORDER — KIDNEY FAILURE BOOK
Freq: Once | Status: AC
  Administered 2017-07-14: 22:00:00
  Filled 2017-07-14: qty 1

## 2017-07-14 MED ORDER — NEPRO/CARBSTEADY PO LIQD
237.0000 mL | Freq: Three times a day (TID) | ORAL | Status: DC
  Administered 2017-07-14 – 2017-07-19 (×3): 237 mL via ORAL
  Filled 2017-07-14 (×18): qty 237

## 2017-07-14 MED ORDER — FUROSEMIDE 80 MG PO TABS
80.0000 mg | ORAL_TABLET | Freq: Every day | ORAL | Status: DC
  Filled 2017-07-14: qty 1

## 2017-07-14 NOTE — Progress Notes (Signed)
[facility] Progress Note    Assessment/ Plan:   Douglas Meyer is a 77 y.o. M with PMH significant for CKD V followed by Dr. Jimmy Footman as an outpatient likely 2/2 DM and HTN, CAD, gout, DM, HLD, anemia who presented from PCP's office 2/2 elevated Cr.  Uremia - BUN now 107 from 176. Improved mentation.  Endorses dysguesia, decreased appetite. Likely 2/2 below.  -plan for HD today, 2/9 Acute on Chronic Kidney Disease stage V - last labs at [facility] Cr 4.80, BUN 81 from 06/27/17. Negative UA on admit. Cr now 10.03. CKD 2/2 HTN and DM. Acute injury possibly secondary to NSAID intake in the face of poor renal reserve. Was planning for possible PD with Dr. Jimmy Footman.  -HD as above HTN - relative hypotension. On lasix, norvasc, trandolopril, metoprolol at home. -hold lasix, ACEi, norvasc  DM - per primary.  CAD - primary team checking troponin 2/2 EKG changes possible LVH.  Anemia - Iron studies incomplete, will get TIBC and sat on labs.  -consider IV iron pending above results as Hgb <10 Hx of gout - stopped allopurinol recently.    Subjective:   Patient feels less foggy today. Still with decreased appetite. Wants to walk in the halls.    Objective:   BP (!) 99/49   Pulse 60   Temp 97.6 F (36.4 C)   Resp 17   Ht 5\' 6"  (1.676 m)   Wt 151 lb 14.4 oz (68.9 kg)   SpO2 91%   BMI 24.52 kg/m   Intake/Output Summary (Last 24 hours) at 07/14/2017 0900 Last data filed at 07/14/2017 0600 Gross per 24 hour  Intake 460 ml  Output 800 ml  Net -340 ml   Weight change: -15.8 oz (-2.715 kg)  Physical Exam: GGE:[id number] male lying in bed in NAD.  CVS: RRR, no murmur Resp: CTAB, easy WOB Abd: SNTND, +Bs Ext: no edema  Imaging: US Renal  Result Date: 07/13/2017 CLINICAL DATA:  Acute onset of renal failure. Chronic renal disease. EXAM: RENAL / URINARY TRACT ULTRASOUND COMPLETE COMPARISON:  None. FINDINGS: Right Kidney: Length: 10.8 cm. Echogenicity within normal limits. Diffuse  cortical thinning is noted. No mass or hydronephrosis visualized. Left Kidney: Length: 10.0 cm. Echogenicity within normal limits. Diffuse cortical thinning is noted. No mass or hydronephrosis visualized. Bladder: Appears normal for degree of bladder distention. The prostate is borderline enlarged, measuring 4.9 cm in transverse dimension. Small bilateral pleural effusions are noted. IMPRESSION: 1. No evidence of hydronephrosis. 2. Diffuse renal cortical thinning reflects chronic renal disease. 3. Small bilateral pleural effusions. 4. Borderline enlarged prostate. Electronically Signed   By: Garald Balding M.D.   On: 07/13/2017 04:47    Labs: BMET Recent Labs  Lab 07/12/17 0949 07/12/17 1832 07/13/17 0332 07/14/17 0546  NA 135 135 134* 136  K 4.6 4.8 4.3 4.0  CL 95* 98* 97* 98*  CO2 21 17* 16* 21*  GLUCOSE 110* 93 96 110*  BUN >130* 176* 172* 107*  CREATININE 10.00* 9.99* 10.03* 7.72*  CALCIUM 9.1 9.4 9.0 8.7*  PHOS  --   --   --  6.3*   CBC Recent Labs  Lab 07/12/17 0949 07/12/17 1832 07/13/17 0332 07/14/17 0546  WBC 9.9 8.4 8.3 6.0  NEUTROABS 7.6  --   --   --   HGB 10.0* 10.0* 9.7* 9.4*  HCT 30.1* 30.9* 29.7* 28.6*  MCV 95.2 93.6 92.8 94.1  PLT 224.0 246 245 223    Medications:    .  aspirin EC  325 mg Oral Daily  . atorvastatin  20 mg Oral q1800  . calcitRIOL  0.5 mcg Oral QODAY  . furosemide  80 mg Oral BID  . heparin  5,000 Units Subcutaneous Q8H  . insulin aspart  0-9 Units Subcutaneous TID WC  . insulin glargine  5 Units Subcutaneous Daily  . metoprolol  100 mg Oral Daily  . omega-3 acid ethyl esters  1 g Oral QHS    Ralene Ok, MD 07/14/17

## 2017-07-14 NOTE — Progress Notes (Signed)
Results for COURTLAND, REAS (MRN [id number]) as of 07/14/2017 10:02  Ref. Range 07/13/2017 17:38 07/13/2017 23:54 07/14/2017 00:42 07/14/2017 01:22 07/14/2017 08:51  Glucose-Capillary Latest Ref Range: 65 - 99 mg/dL 71 44 (LL) 69 128 (H) 82  Noted that blood sugars have been less than 70 mg/dl.  Recommend discontinuing Lantus 5 units daily since blood sugars have been less than 100 mg/dl. Continue Novolog SENSITIVE correction scale as ordered. Will continue to monitor blood sugars while in the hospital.  Harvel Ricks RN BSN CDE Diabetes Coordinator Pager: [phone]  8am-5pm

## 2017-07-14 NOTE — Progress Notes (Signed)
ANTICOAGULATION CONSULT NOTE - Initial Consult  Pharmacy Consult for heparin Indication: chest pain/ACS  No Known Allergies  Patient Measurements: Height: 5\' 6"  (167.6 cm) Weight: 147 lb 11.2 oz (67 kg) IBW/kg (Calculated) : 63.8 Heparin Dosing Weight: 68 kg  Vital Signs: Temp: 98.4 F (36.9 C) (02/08 2114) Temp Source: Oral (02/08 2114) BP: 95/51 (02/08 2114) Pulse Rate: 86 (02/08 2114)  Labs: Recent Labs    07/12/17 1832 07/13/17 0332 07/13/17 0900 07/14/17 0546 07/14/17 1605  HGB 10.0* 9.7*  --  9.4*  --   HCT 30.9* 29.7*  --  28.6*  --   PLT 246 245  --  223  --   CREATININE 9.99* 10.03*  --  7.72*  --   TROPONINI  --   --  3.70*  --  4.40*     Assessment: Admitted yesterday, cardiology saw today for troponin elevation, no chest pain Just received heparin 5000 units x1 [place] - will hold initial bolus with heparin gtt ESRD on HD  Goal of Therapy:  Heparin level 0.3-0.7 units/ml Monitor platelets by anticoagulation protocol: Yes    Plan:  -Heparin 800 units/hr -Daily HL, CBC  -Check cHL in 8 hours   Harvel Quale 07/14/2017,11:23 PM

## 2017-07-14 NOTE — Care Management Note (Addendum)
Case Management Note  Patient Details  Name: Douglas Meyer MRN: [id number] Date of Birth: [birth year]  Subjective/Objective:  Admitted for (Uremia)  Acute on Chronic Kidney Disease stage V.            Action/Plan: Nephrology consulted and hemodialysis iniated yesterday 07/13/2017 via left brachiocephalic fistula.  Expected Discharge Date:   N/A               Expected Discharge Plan:  [place]  Discharge planning Services  CM Consult    Status of Service:  In process, will continue to follow  Additional Comments: Prior to admission patient lived at home with spouse. PCP is Stevie Kern.  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   Home DME: None.  Discussed recommendation for DME: Rolling walker, offered choice and patient okay with [facility].  Referral for DME called to [facility] with [facility].  Prior to admission patient was in process of have home dialysis with home service but became ill.  Cannot recall the name of Agency at this time.  States he will check with wife when she comes up.  Also discussed recommendation for [facility] PT and patient wants to think about it.  NCM provided list of [place] [facility] to patient and he plans to discuss with wife and will let us know.  Patient denies inability to afford medications or food. NCM will continue to monitor for discharge transition home.    Kristen Cardinal, RN  Nurse case manager [phone] 07/14/2017, 2:52 PM

## 2017-07-14 NOTE — Progress Notes (Addendum)
PROGRESS NOTE    Delfin Squillace  IWP:[id number] DOB: 01/07/1941 DOA: 07/13/2017 PCP: Dorena Cookey, MD   Brief Narrative: Douglas Meyer is a 77 y.o.  male with history of chronic kidney disease stage V, CAD, gout, diabetes mellitus, hyperlipidemia, anemia. He presented secondary to elevated creatinine and BUN seen at PCP's office concerning for acute kidney injury. No mental status changes. Nephrology consulted and hemodialysis started.   Assessment & Plan:   Principal Problem:   Renal failure (ARF), acute on chronic (HCC) Active Problems:   Type 1 diabetes mellitus ([place])   Gout   Essential hypertension   Peripheral vascular disease ([place])   Acute kidney injury on CKD 5 Uremia Patient previously considered for peritoneal dialysis. Baseline creatinine of about 4.1-4.8. 10.00 on admission with severely elevated BUN. No mental status changes. -Nephrology recommendations: IHD -repeat BMP in AM -UOP (strict in/out)  Diabetes mellitus, insulin dependent Last hemoglobin A1C of 7.6%. Episode of hypoglycemia yesterday. For his age, his hemoglobin A1C is adequate. -Continue SSI and Lantus -Discontinue glipizide on discharge.  Essential hypertension Blood pressure on low side -Continue to hold antihypertensives  Peripheral vascular disease CAD No chest pain. Troponin.   Gout Stable.  Anemia Normocytic. Secondary to chronic disease from kidney disease. Stable. No evidence of bleeding. Iron and ferritin within normal limits.  Elevated troponin Some ST changes likely secondary to LVH. Troponin significantly elevated in setting of significant kidney injury. No chest pain or equivalent.  -repeat troponin. -EKG   DVT prophylaxis: Heparin subq Code Status: Full code Family Communication: None at bedside Disposition Plan: Pending renal management   Consultants:   Nephrology  Procedures:   Hemodialysis  Antimicrobials:  None    Subjective: No concerns  today.  Objective: Vitals:   07/13/17 2040 07/13/17 2137 07/14/17 0454 07/14/17 1003  BP: (!) 102/55  (!) 99/49 (!) 99/50  Pulse: 86  60 87  Resp: 16  17 18   Temp: 97.7 F (36.5 C)  97.6 F (36.4 C) 98 F (36.7 C)  TempSrc:    Oral  SpO2: 97%  91% 99%  Weight:  68.9 kg (151 lb 14.4 oz)    Height:        Intake/Output Summary (Last 24 hours) at 07/14/2017 1601 Last data filed at 07/14/2017 1003 Gross per 24 hour  Intake 580 ml  Output 800 ml  Net -220 ml   Filed Weights   07/13/17 1451 07/13/17 1651 07/13/17 2137  Weight: 68.5 kg (151 lb 0.2 oz) 68 kg (149 lb 14.6 oz) 68.9 kg (151 lb 14.4 oz)    Examination:  General exam: Appears calm and comfortable  Respiratory: Clear to auscultation bilaterally. Unlabored work of breathing. No wheezing or rales. Cardiovascular: Regular rate and rhythm. Normal S1 and S2. No heart murmurs present. No extra heart sounds Gastrointestinal: Soft, non-tender, non-distended, no guarding, no rebound, no masses felt Neuro: no focal deficits Psych: normal mood   Data Reviewed: I have personally reviewed following labs and imaging studies  CBC: Recent Labs  Lab 07/12/17 0949 07/12/17 1832 07/13/17 0332 07/14/17 0546  WBC 9.9 8.4 8.3 6.0  NEUTROABS 7.6  --   --   --   HGB 10.0* 10.0* 9.7* 9.4*  HCT 30.1* 30.9* 29.7* 28.6*  MCV 95.2 93.6 92.8 94.1  PLT 224.0 246 245 505   Basic Metabolic Panel: Recent Labs  Lab 07/12/17 0949 07/12/17 1832 07/13/17 0332 07/14/17 0546  NA 135 135 134* 136  K 4.6 4.8 4.3 4.0  CL 95* 98* 97* 98*  CO2 21 17* 16* 21*  GLUCOSE 110* 93 96 110*  BUN >130* 176* 172* 107*  CREATININE 10.00* 9.99* 10.03* 7.72*  CALCIUM 9.1 9.4 9.0 8.7*  PHOS  --   --   --  6.3*   GFR: Estimated Creatinine Clearance: 7.2 mL/min (A) (by C-G formula based on SCr of 7.72 mg/dL (H)). Liver Function Tests: Recent Labs  Lab 07/14/17 0546  ALBUMIN 2.7*   No results for input(s): LIPASE, AMYLASE in the last 168 hours. No  results for input(s): AMMONIA in the last 168 hours. Coagulation Profile: No results for input(s): INR, PROTIME in the last 168 hours. Cardiac Enzymes: Recent Labs  Lab 07/13/17 0900  TROPONINI 3.70*   BNP (last 3 results) No results for input(s): PROBNP in the last 8760 hours. HbA1C: Recent Labs    07/12/17 0949  HGBA1C 7.6*   CBG: Recent Labs  Lab 07/13/17 2354 07/14/17 0042 07/14/17 0122 07/14/17 0851 07/14/17 1213  GLUCAP 44* 69 128* 82 88   Lipid Profile: No results for input(s): CHOL, HDL, LDLCALC, TRIG, CHOLHDL, LDLDIRECT in the last 72 hours. Thyroid Function Tests: Recent Labs    07/12/17 0949  TSH 4.66*  FREET4 0.89  T3FREE 3.1   Anemia Panel: Recent Labs    07/12/17 0949 07/14/17 1107  VITAMINB12 >1500*  --   FERRITIN 255.7  --   TIBC  --  230*  IRON 52 32*   Sepsis Labs: No results for input(s): PROCALCITON, LATICACIDVEN in the last 168 hours.  No results found for this or any previous visit (from the past 240 hour(s)).       Radiology Studies: US Renal  Result Date: 07/13/2017 CLINICAL DATA:  Acute onset of renal failure. Chronic renal disease. EXAM: RENAL / URINARY TRACT ULTRASOUND COMPLETE COMPARISON:  None. FINDINGS: Right Kidney: Length: 10.8 cm. Echogenicity within normal limits. Diffuse cortical thinning is noted. No mass or hydronephrosis visualized. Left Kidney: Length: 10.0 cm. Echogenicity within normal limits. Diffuse cortical thinning is noted. No mass or hydronephrosis visualized. Bladder: Appears normal for degree of bladder distention. The prostate is borderline enlarged, measuring 4.9 cm in transverse dimension. Small bilateral pleural effusions are noted. IMPRESSION: 1. No evidence of hydronephrosis. 2. Diffuse renal cortical thinning reflects chronic renal disease. 3. Small bilateral pleural effusions. 4. Borderline enlarged prostate. Electronically Signed   By: Garald Balding M.D.   On: 07/13/2017 04:47        Scheduled  Meds: . aspirin EC  325 mg Oral Daily  . atorvastatin  20 mg Oral q1800  . calcitRIOL  0.5 mcg Oral QODAY  . [START ON 07/15/2017] furosemide  80 mg Oral Daily  . heparin  5,000 Units Subcutaneous Q8H  . insulin aspart  0-9 Units Subcutaneous TID WC  . insulin glargine  5 Units Subcutaneous Daily  . metoprolol  100 mg Oral Daily  . omega-3 acid ethyl esters  1 g Oral QHS   Continuous Infusions: . sodium chloride    . sodium chloride       LOS: 1 day     Cordelia Poche, MD [facility] 07/14/2017, 4:01 PM Pager: [phone]  If 7PM-7AM, please contact night-coverage [email or web address] Password [email or web address] 07/14/2017, 4:01 PM

## 2017-07-14 NOTE — Progress Notes (Signed)
Hypoglycemic event last nights, CBG's below 100 today, holding lantus per diabetic coordinators recommendations, MD paged waiting for orders

## 2017-07-14 NOTE — Progress Notes (Signed)
Initial Nutrition Assessment  DOCUMENTATION CODES:   Not applicable  INTERVENTION:   Nepro PO TID, each supplement provides 425 kcal and 19.1 grams of protein   Liberalizing diet from Renal/Carb to Renal    HS Snack  NUTRITION DIAGNOSIS:   Increased nutrient needs related to chronic illness as evidenced by estimated needs.   GOAL:   Patient will meet greater than or equal to 90% of their needs   MONITOR:   PO intake, Supplement acceptance, Labs, Weight trends  REASON FOR ASSESSMENT:   Malnutrition Screening Tool    ASSESSMENT:   77 yo male admitted 2/7 with acute on chronic renal failure (CKD5), normocytic anemia. PMH Insulin-dependent DM, CAD, PVD, gout, colon polyps.   Pt reported his UBW is 165 "a couple months ago", which is consistent with wt history from prior encounters. Average wt prior ~4.5 months ago 162 lb (7% wt loss).   Pt RN reports that pt has not been eating. Pt has been very sleepy and uremia is causing decreased appetite and dysguesia Pt scheduled to start HD today  Pt reports "very good" appetite PTA. Poor appetite since admission. Reports only eating a couple bites of meals; reports he is "just not hungry". Denies nausea. This is consistent with the diet summary (0% meals eaten).  Discussed the importance of eating calories/protein to help with recovery. Pt has not tried supplemental nutritional PTA, but said he would be willing to try in order to get adequate calories/protein when he is not hungry.  Pt has been experiencing episodes of low BG levels.    Pt is at risk for malnutrition.  Recommend liberalizing diet and ordering HS snack to improve PO intake and maintain BG levels within acceptable range.   Pt would benefit from nutrition education prior to discharge.   Dietary Recall PTA:  B: Toast, custard, coffee L: Sandwich D: Protein (chicken, fish, steak) with starch (bread/potatoes) and fresh vegetables Drinks regular soda (Sprite,  avoids colas) and beer with dinner No snacks Wife prepares meals   Medications:  Calcitriol Lantus to be d/c due to CBG <100 mg/dL SS Insulin Lasix  Labs reviewed:  CBG's: 71, 44 (LL), 69, 128, 82, 88 BUN: 107 (H) Cr 7.72 (H) Phosphorus: 6.3  NUTRITION - FOCUSED PHYSICAL EXAM:    Most Recent Value  Orbital Region  No depletion  Upper Arm Region  Mild depletion  Thoracic and Lumbar Region  No depletion  Buccal Region  No depletion  Temple Region  Mild depletion  Clavicle Bone Region  No depletion  Scapular Bone Region  No depletion  Dorsal Hand  Mild depletion  Patellar Region  Mild depletion  Anterior Thigh Region  Moderate depletion  Posterior Calf Region  Mild depletion  Edema (RD Assessment)  Moderate  Hair  Reviewed  Eyes  Reviewed  Mouth  Reviewed  Skin  Reviewed  Nails  Reviewed       Diet Order:  Diet renal with fluid restriction Fluid restriction: 1200 mL Fluid; Room service appropriate? Yes; Fluid consistency: Thin  EDUCATION NEEDS:   No education needs have been identified at this time  Skin:  Skin Assessment: Reviewed RN Assessment  Last BM:  2/5  Height:   Ht Readings from Last 1 Encounters:  07/12/17 5\' 6"  (1.676 m)    Weight:   Wt Readings from Last 1 Encounters:  07/13/17 151 lb 14.4 oz (68.9 kg)    Ideal Body Weight:  51.3 kg  BMI:  Body mass index is  24.52 kg/m.  Estimated Nutritional Needs:   Kcal:  1,900-2,100  Protein:  90-100 grams  Fluid:  <=1.2 L    Edmonia Lynch, MS Dietetic Intern Pager: [phone]

## 2017-07-14 NOTE — Consult Note (Signed)
Cardiology Consultation:   Patient ID: Douglas Meyer; [id number]; 1941/06/01   Admit date: 07/13/2017 Date of Consult: 07/14/2017  Primary Care Provider: Dorena Cookey, MD Primary Cardiologist: Minus Breeding MD   Patient Profile:   Douglas Meyer is a 77 y.o. male with a hx of CAD with prior MI, ESRD on HD, DM2, PVD who is being seen today for the evaluation of elevated troponin at the request of Dr. Gardenia Phlegm.  History of Present Illness:   Douglas Meyer is a 77 y.o. male with a hx of CAD with prior MI, CKD, DM2, PVD who is being seen today for the evaluation of elevated troponin.  The patient has a history of known CAD and follows with Dr. Percival Spanish, who last saw the patient in September 2018. At that visit, he denied any cardiac symptoms. He has had prior LHC that reportedly showed unspecified occluded vessel with collaterals. His last nuclear stress test in 2014 showed inferior infarct with no high risk features. He also has a history of progressive CKD for which a fistula has been placed in anticipation of the need of dialysis.  Douglas Meyer presented to the ED yesterday with worsening renal function with Cr of 10 up from prior measurements in the 4 range. He reported feeling generally unwell but denied chest pain or dyspnea. He was evaluated by nephrology on admission who initiated HD. Other workup included ECG that showed lateral ST depressions most notably in lateral leads and troponin that was initially 3.7 but rose to 4.4. Given this finding, cardiology was consulted.  On my evaluation, the patient is resting comfortably in bed. He denies any current or recent chest pain or dyspnea or edema or other complaints.   Past Medical History:  Diagnosis Date  . Bilateral carotid artery disease ([place])   . CAD (coronary artery disease) 05/15/10   out of hospital myocardial infarction in the 1990's. this was picked up on an  EKG in 1997.  Cardiac catherterization demonstrated an occluded vessel  and collateras.  I have no description of this.  A stress  perfusion study  done 2008 demonstrated an ejection fraction  of  53% with inferolateral  hypokinesis.  There was an inferolateral defect with  scar and some mild mixed ischemia.   managed rx  . Cancer ([place])    skin  . CKD (chronic kidney disease), stage IV ([place])   . Diabetes mellitus    DM, type II  . ED (erectile dysfunction)   . Gout   . History of kidney stones   . Hx of cardiovascular stress test    a. Lexiscan myoview 10/18/12: EF 53%, inf and inf-lat scar, slight peri-infarct ischemia, small area of apical ischemia; no change from 2006  . Hyperlipidemia   . Hypertension   . Myocardial infarction ([place])   . Nephrolithiasis   . Polyp of colon   . PVD (peripheral vascular disease) ([place])     Past Surgical History:  Procedure Laterality Date  . AV FISTULA PLACEMENT Right 06/19/2015   Procedure: ARTERIOVENOUS (AV) FISTULA CREATION;  Surgeon: Serafina Mitchell, MD;  Location: [facility];  Service: Vascular;  Laterality: Right;  . BASCILIC VEIN TRANSPOSITION Left 08/11/2016   Procedure: BASCILIC VEIN TRANSPOSITION-LEFT 1ST STAGE;  Surgeon: Serafina Mitchell, MD;  Location: [facility];  Service: Vascular;  Laterality: Left;  . BASCILIC VEIN TRANSPOSITION Left 10/21/2016   Procedure: LEFT ARM 2ND STAGE BASILIC VEIN TRANSPOSITION;  Surgeon: Serafina Mitchell, MD;  Location: [facility];  Service: Vascular;  Laterality: Left;  . COLONOSCOPY W/ POLYPECTOMY    . CYSTOSCOPY     stone removal x 1  . ENDARTERECTOMY Right 11/28/2014   Procedure: RIGHT CAROTID ENDARTERECTOMY;  Surgeon: Serafina Mitchell, MD;  Location: [facility];  Service: Vascular;  Laterality: Right;  . EYE SURGERY  06/2013   bilateral cataract surgery  . HUMERUS FRACTURE SURGERY     left  . MOHS SURGERY     multiple  . PATCH ANGIOPLASTY Right 11/28/2014   Procedure: PATCH ANGIOPLASTY RIGHT CAROTID;  Surgeon: Serafina Mitchell, MD;  Location: [facility] OR;  Service: Vascular;  Laterality: Right;     Home  Medications:  Prior to Admission medications   Medication Sig Start Date End Date Taking? Authorizing Provider  allopurinol (ZYLOPRIM) 300 MG tablet TAKE ONE TABLET BY MOUTH ONCE DAILY 06/07/17  Yes Dorena Cookey, MD  Ascorbic Acid (VITAMIN C) 500 MG tablet Take 500 mg by mouth at bedtime.    Yes [provider]  aspirin EC 325 MG tablet Take 325 mg by mouth daily.   Yes [provider]  atorvastatin (LIPITOR) 20 MG tablet TAKE ONE TABLET BY MOUTH ONCE DAILY AT  6  PM 05/12/17  Yes Dorena Cookey, MD  benzonatate (TESSALON) 100 MG capsule Take 100 mg by mouth 3 (three) times daily.   Yes [provider]  calcitRIOL (ROCALTROL) 0.5 MCG capsule TAKE 1 CAPSULE BY MOUTH EVERY OTHER DAY 04/03/17  Yes Minus Breeding, MD  furosemide (LASIX) 80 MG tablet Take 1 tablet (80 mg total) by mouth 2 (two) times daily. 08/11/16  Yes Rhyne, Samantha J, PA-C  glipiZIDE (GLUCOTROL) 10 MG tablet Take 1 tablet (10 mg total) by mouth every evening. Patient taking differently: Take 5 mg by mouth 2 (two) times daily before a meal.  10/21/16  Yes Trinh, Janalyn Harder, PA-C  Insulin Glargine (LANTUS SOLOSTAR) 100 UNIT/ML Solostar Pen INJECT 26 UNITS SUBCUTANEOUSLY AT BEDTIME Patient taking differently: Inject 10 Units into the skin daily.  10/21/16  Yes Virgina Jock A, PA-C  metoprolol (TOPROL-XL) 200 MG 24 hr tablet Take 0.5 tablets (100 mg total) by mouth daily. 03/31/17  Yes Minus Breeding, MD  Multiple Vitamins-Minerals (MULTIVITAMIN WITH MINERALS) tablet Take 1 tablet by mouth at bedtime.    Yes [provider]  Omega-3 Fatty Acids (FISH OIL) 500 MG CAPS Take 500 mg by mouth at bedtime.    Yes [provider]  trandolapril (MAVIK) 4 MG tablet Take 2 tablets (8 mg total) by mouth at bedtime. NEED OV. 11/29/16  Yes Minus Breeding, MD  allopurinol (ZYLOPRIM) 300 MG tablet TAKE 1 BY MOUTH DAILY Patient not taking: Reported on 07/13/2017 06/07/17   Dorena Cookey, MD  amLODipine  (NORVASC) 5 MG tablet TAKE ONE TABLET BY MOUTH ONCE DAILY Patient not taking: Reported on 07/13/2017 05/12/17   Dorena Cookey, MD  BD PEN NEEDLE NANO U/F 32G X 4 MM MISC  USE AS DIRECTED DAILY 04/25/17   Dorena Cookey, MD  Lancets (ACCU-CHEK MULTICLIX) lancets 1 each by Other route daily. Dx 250.01 05/07/13   Dorena Cookey, MD    Inpatient Medications: Scheduled Meds: . aspirin EC  325 mg Oral Daily  . atorvastatin  20 mg Oral q1800  . calcitRIOL  0.5 mcg Oral QODAY  . feeding supplement (NEPRO CARB STEADY)  237 mL Oral TID BM  . [START ON 07/15/2017] furosemide  80 mg Oral Daily  . heparin  5,000  Units Subcutaneous Q8H  . insulin aspart  0-9 Units Subcutaneous TID WC  . insulin glargine  5 Units Subcutaneous Daily  . metoprolol  100 mg Oral Daily  . omega-3 acid ethyl esters  1 g Oral QHS   Continuous Infusions: . sodium chloride    . sodium chloride     PRN Meds: sodium chloride, sodium chloride, acetaminophen **OR** acetaminophen, alteplase, heparin, lidocaine (PF), lidocaine-prilocaine, ondansetron **OR** ondansetron (ZOFRAN) IV, pentafluoroprop-tetrafluoroeth  Allergies:   No Known Allergies  Social History:   Social History   Socioeconomic History  . Marital status: Married    Spouse name: Not on file  . Number of children: 4  . Years of education: Not on file  . Highest education level: Not on file  Social Needs  . Financial resource strain: Not on file  . Food insecurity - worry: Not on file  . Food insecurity - inability: Not on file  . Transportation needs - medical: Not on file  . Transportation needs - non-medical: Not on file  Occupational History    Employer: [employer]    Comment: retired    Fish farm manager: RETIRED  Tobacco Use  . Smoking status: Former Smoker    Years: 6.00    Last attempt to quit: 06/06/1973    Years since quitting: 44.1  . Smokeless tobacco: Never Used  Substance and Sexual Activity  . Alcohol use: Yes    Alcohol/week: 4.2 oz    Types:  7 Cans of beer per week    Comment: 1 beer daily  . Drug use: No  . Sexual activity: Not on file  Other Topics Concern  . Not on file  Social History Narrative   Lives with wife.      Family History:    Family History  Problem Relation Age of Onset  . Heart attack Father        deceased 3  . Diabetes Brother        rheumatic fever, peptic ulcer disease, deceased  . Diabetes Sister        deceased 19  . Heart attack Mother        deceased age 14s  . Diabetes type II Brother   . Diabetes type II Brother      ROS:  Please see the history of present illness.  All other ROS reviewed and negative.     Physical Exam/Data:   Vitals:   07/13/17 2137 07/14/17 0454 07/14/17 1003 07/14/17 1700  BP:  (!) 99/49 (!) 99/50 (!) 95/58  Pulse:  60 87 88  Resp:  17 18 18   Temp:  97.6 F (36.4 C) 98 F (36.7 C) 98.3 F (36.8 C)  TempSrc:   Oral Oral  SpO2:  91% 99% 100%  Weight: 68.9 kg (151 lb 14.4 oz)     Height:        Intake/Output Summary (Last 24 hours) at 07/14/2017 2025 Last data filed at 07/14/2017 1003 Gross per 24 hour  Intake 460 ml  Output 300 ml  Net 160 ml   Filed Weights   07/13/17 1451 07/13/17 1651 07/13/17 2137  Weight: 68.5 kg (151 lb 0.2 oz) 68 kg (149 lb 14.6 oz) 68.9 kg (151 lb 14.4 oz)   Body mass index is 24.52 kg/m.  General:  Well nourished, well developed, in no acute distress HEENT: normal Lymph: no adenopathy Neck: no JVD Cardiac:  normal S1, S2; RRR; no murmur Lungs:  clear to auscultation bilaterally, no wheezing, rhonchi or  rales  Abd: soft, nontender, no hepatomegaly  Ext: no edema Musculoskeletal:  No deformities, BUE and BLE strength normal and equal Skin: warm and dry  Neuro:  no focal abnormalities noted Psych:  Normal affect   EKG:  The EKG was personally reviewed and demonstrates:  NSR with LVH and st depressions Telemetry:  Telemetry was personally reviewed and demonstrates:  No major evfents  Relevant CV Studies: TTE  09/2014: - Left ventricle: The cavity size was normal. There was mild focal   basal hypertrophy of the septum. Systolic function was normal.   The estimated ejection fraction was in the range of 55% to 60%.   Wall motion was normal; there were no regional wall motion   abnormalities. Doppler parameters are consistent with abnormal   left ventricular relaxation (grade 1 diastolic dysfunction).   There was no evidence of elevated ventricular filling pressure by   Doppler parameters. - Aortic valve: Trileaflet; normal thickness leaflets. There was no   regurgitation. - Mitral valve: Structurally normal valve. There was no   regurgitation. - Left atrium: The atrium was moderately dilated. - Right ventricle: Systolic function was normal. - Right atrium: The atrium was normal in size. - Tricuspid valve: There was mild regurgitation. - Pulmonic valve: There was no regurgitation. - Pulmonary arteries: Systolic pressure was within the normal   range. - Inferior vena cava: The vessel was normal in size. - Pericardium, extracardiac: There was no pericardial effusion.  Nuclear Stress Test 10/2012: Inferior scar with low-normal LVEF    Laboratory Data:  Chemistry Recent Labs  Lab 07/12/17 1832 07/15/17 0332 07/14/17 0546  NA 135 134* 136  K 4.8 4.3 4.0  CL 98* 97* 98*  CO2 17* 16* 21*  GLUCOSE 93 96 110*  BUN 176* 172* 107*  CREATININE 9.99* 10.03* 7.72*  CALCIUM 9.4 9.0 8.7*  GFRNONAA 4* 4* 6*  GFRAA 5* 5* 7*  ANIONGAP 20* 21* 17*    Recent Labs  Lab 07/14/17 0546  ALBUMIN 2.7*   Hematology Recent Labs  Lab 07/12/17 1832 07/15/17 0332 07/14/17 0546  WBC 8.4 8.3 6.0  RBC 3.30* 3.20* 3.04*  HGB 10.0* 9.7* 9.4*  HCT 30.9* 29.7* 28.6*  MCV 93.6 92.8 94.1  MCH 30.3 30.3 30.9  MCHC 32.4 32.7 32.9  RDW 15.2 14.9 15.5  PLT 246 245 223   Cardiac Enzymes Recent Labs  Lab 07-15-2017 0900 07/14/17 1605  TROPONINI 3.70* 4.40*   No results for input(s): TROPIPOC in the last  168 hours.  BNPNo results for input(s): BNP, PROBNP in the last 168 hours.  DDimer No results for input(s): DDIMER in the last 168 hours.  Radiology/Studies:  US Renal  Result Date: July 15, 2017 CLINICAL DATA:  Acute onset of renal failure. Chronic renal disease. EXAM: RENAL / URINARY TRACT ULTRASOUND COMPLETE COMPARISON:  None. FINDINGS: Right Kidney: Length: 10.8 cm. Echogenicity within normal limits. Diffuse cortical thinning is noted. No mass or hydronephrosis visualized. Left Kidney: Length: 10.0 cm. Echogenicity within normal limits. Diffuse cortical thinning is noted. No mass or hydronephrosis visualized. Bladder: Appears normal for degree of bladder distention. The prostate is borderline enlarged, measuring 4.9 cm in transverse dimension. Small bilateral pleural effusions are noted. IMPRESSION: 1. No evidence of hydronephrosis. 2. Diffuse renal cortical thinning reflects chronic renal disease. 3. Small bilateral pleural effusions. 4. Borderline enlarged prostate. Electronically Signed   By: Garald Balding M.D.   On: 15-Jul-2017 04:47    Assessment and Plan:   Elevated Troponin ECG abnormalities CAD  with prior MI The patient has a history of CAD with prior MI. Outside [place] reportedly showed occluded and collateralized vessel(s), and prior stress test showed prior infarct. He is admitted for worsening renal function for which he has been initiated on dialysis. Workup has included ST depressions on ECG (likely due at least in part to LVH, although these are more pronounced than prior study) and elevated troponin. While these findings may represent demand ischemia in the setting of his progressive renal disease, ACS cannot be excluded, particularly given his history. He is currently chest pain free, although he is diabetic and may not manifest typical symptoms. At this time, would recommend initial treatment per ACS protocol while continuing management of his acute issues. Based on his clinical  trajectory, further ischemic evaluation can be considered. -Continue to monitor on telemetry -Continue to trend symptoms and ECGs -Starting heparin gtt per ACS nomogram -ASA, atorvastatin, metoprolol -Will follow along to determine appropriate plan for ischemic evaluation  HTN -Continue metoprolol   For questions or updates, please contact [facility] Please consult [email or web address] for contact info under Cardiology/STEMI.   Signed, Nila Nephew, MD  07/14/2017 8:25 PM

## 2017-07-14 NOTE — Evaluation (Addendum)
Physical Therapy Evaluation Patient Details Name: Douglas Meyer MRN: [id number] DOB: 1941-03-07 Today's Date: 07/14/2017   History of Present Illness  Pt is a 77 y/o male admitted secondary to Acute on chronic renal failure. Pt also with elevated troponin, however, per RN likely due to renal failure. PMH inlucdes R AV fistula placement, HTN, DM, gout, PVD, CAD, CKD V, and skin cancer.   Clinical Impression  Pt admitted secondary to problem above with deficits below. Pt presenting with unsteadiness and fatigue this session which limited gait distance. Pt requiring min to min guard assist during gait. Will continue to follow acutely to maximize functional mobility independence and safety.     Follow Up Recommendations Home health PT;Supervision for mobility/OOB    Equipment Recommendations  Rolling walker with 5" wheels    Recommendations for Other Services OT consult     Precautions / Restrictions Precautions Precautions: Fall Restrictions Weight Bearing Restrictions: No      Mobility  Bed Mobility Overal bed mobility: Modified Independent             General bed mobility comments: Increased time, however, no external assist required.   Transfers Overall transfer level: Needs assistance Equipment used: None;Rolling walker (2 wheeled) Transfers: Sit to/from Stand Sit to Stand: Min guard         General transfer comment: Min guard for safety. Cues for safe hand placement with use of RW.   Ambulation/Gait Ambulation/Gait assistance: Min assist;Min guard Ambulation Distance (Feet): 100 Feet(25' and 75') Assistive device: None;Rolling walker (2 wheeled) Gait Pattern/deviations: Step-through pattern;Decreased stride length Gait velocity: Decreased  Gait velocity interpretation: Below normal speed for age/gender General Gait Details: Pt very unsteady without use of AD. Had LOB X 2 and requried min A for steadying. With use of RW, pt with increased steadiness and required  min guard assist. Pt fatiguing easy, so further distance limited.   Stairs            Wheelchair Mobility    Modified Rankin (Stroke Patients Only)       Balance Overall balance assessment: Needs assistance Sitting-balance support: No upper extremity supported;Feet supported Sitting balance-Leahy Scale: Good     Standing balance support: Bilateral upper extremity supported;No upper extremity supported;During functional activity Standing balance-Leahy Scale: Fair Standing balance comment: Able to maintain static standing without UE support.                              Pertinent Vitals/Pain Pain Assessment: No/denies pain    Home Living Family/patient expects to be discharged to:: Private residence Living Arrangements: Spouse/significant other Available Help at Discharge: Family;Available 24 hours/day Type of Home: House Home Access: Stairs to enter Entrance Stairs-Rails: None Entrance Stairs-Number of Steps: 3 Home Layout: One level Home Equipment: None      Prior Function Level of Independence: Independent               Hand Dominance   Dominant Hand: Right    Extremity/Trunk Assessment   Upper Extremity Assessment Upper Extremity Assessment: Generalized weakness    Lower Extremity Assessment Lower Extremity Assessment: Generalized weakness    Cervical / Trunk Assessment Cervical / Trunk Assessment: Kyphotic  Communication   Communication: No difficulties  Cognition Arousal/Alertness: Awake/alert Behavior During Therapy: WFL for tasks assessed/performed Overall Cognitive Status: Within Functional Limits for tasks assessed  General Comments      Exercises     Assessment/Plan    PT Assessment Patient needs continued PT services  PT Problem List Decreased strength;Decreased balance;Decreased mobility;Decreased activity tolerance;Decreased knowledge of use of DME        PT Treatment Interventions DME instruction;Gait training;Stair training;Functional mobility training;Therapeutic activities;Therapeutic exercise;Balance training;Neuromuscular re-education;Patient/family education    PT Goals (Current goals can be found in the Care Plan section)  Acute Rehab PT Goals Patient Stated Goal: to walk more  PT Goal Formulation: With patient Time For Goal Achievement: 07/28/17 Potential to Achieve Goals: Good    Frequency Min 3X/week   Barriers to discharge        Co-evaluation               AM-PAC PT "6 Clicks" Daily Activity  Outcome Measure Difficulty turning over in bed (including adjusting bedclothes, sheets and blankets)?: A Little Difficulty moving from lying on back to sitting on the side of the bed? : A Little Difficulty sitting down on and standing up from a chair with arms (e.g., wheelchair, bedside commode, etc,.)?: Unable Help needed moving to and from a bed to chair (including a wheelchair)?: A Little Help needed walking in hospital room?: A Little Help needed climbing 3-5 steps with a railing? : A Little 6 Click Score: 16    End of Session Equipment Utilized During Treatment: Gait belt Activity Tolerance: Patient limited by fatigue Patient left: in chair;with call bell/phone within reach Nurse Communication: Mobility status PT Visit Diagnosis: Unsteadiness on feet (R26.81);Muscle weakness (generalized) (M62.81)    Time: 7290-2111 PT Time Calculation (min) (ACUTE ONLY): 16 min   Charges:   PT Evaluation $PT Eval Low Complexity: 1 Low     PT G Codes:        Leighton Ruff, PT, DPT  Acute Rehabilitation Services  Pager: [phone]   [name] 07/14/2017, 1:53 PM

## 2017-07-15 DIAGNOSIS — R778 Other specified abnormalities of plasma proteins: Secondary | ICD-10-CM

## 2017-07-15 DIAGNOSIS — I2510 Atherosclerotic heart disease of native coronary artery without angina pectoris: Secondary | ICD-10-CM

## 2017-07-15 DIAGNOSIS — R7989 Other specified abnormal findings of blood chemistry: Secondary | ICD-10-CM

## 2017-07-15 LAB — CBC
HCT: 27.3 % — ABNORMAL LOW (ref 39.0–52.0)
HCT: 27.2 % — ABNORMAL LOW (ref 39.0–52.0)
HEMOGLOBIN: 9.1 g/dL — AB (ref 13.0–17.0)
Hemoglobin: 8.8 g/dL — ABNORMAL LOW (ref 13.0–17.0)
MCH: 31 pg (ref 26.0–34.0)
MCH: 30.4 pg (ref 26.0–34.0)
MCHC: 33.3 g/dL (ref 30.0–36.0)
MCHC: 32.4 g/dL (ref 30.0–36.0)
MCV: 92.9 fL (ref 78.0–100.0)
MCV: 94.1 fL (ref 78.0–100.0)
Platelets: 236 10*3/uL (ref 150–400)
Platelets: 198 10*3/uL (ref 150–400)
RBC: 2.94 MIL/uL — ABNORMAL LOW (ref 4.22–5.81)
RBC: 2.89 MIL/uL — ABNORMAL LOW (ref 4.22–5.81)
RDW: 15.2 % (ref 11.5–15.5)
RDW: 15.3 % (ref 11.5–15.5)
WBC: 7.1 10*3/uL (ref 4.0–10.5)
WBC: 8.1 10*3/uL (ref 4.0–10.5)

## 2017-07-15 LAB — RENAL FUNCTION PANEL
ANION GAP: 18 — AB (ref 5–15)
Albumin: 2.6 g/dL — ABNORMAL LOW (ref 3.5–5.0)
BUN: 117 mg/dL — ABNORMAL HIGH (ref 6–20)
CHLORIDE: 98 mmol/L — AB (ref 101–111)
CO2: 18 mmol/L — ABNORMAL LOW (ref 22–32)
Calcium: 8.7 mg/dL — ABNORMAL LOW (ref 8.9–10.3)
Creatinine, Ser: 7.96 mg/dL — ABNORMAL HIGH (ref 0.61–1.24)
GFR calc Af Amer: 7 mL/min — ABNORMAL LOW (ref >60)
GFR calc non Af Amer: 6 mL/min — ABNORMAL LOW (ref >60)
GLUCOSE: 164 mg/dL — AB (ref 65–99)
Phosphorus: 6.6 mg/dL — ABNORMAL HIGH (ref 2.5–4.6)
Potassium: 4.1 mmol/L (ref 3.5–5.1)
Sodium: 134 mmol/L — ABNORMAL LOW (ref 135–145)

## 2017-07-15 LAB — GLUCOSE, CAPILLARY
Glucose-Capillary: 166 mg/dL — ABNORMAL HIGH (ref 65–99)
Glucose-Capillary: 190 mg/dL — ABNORMAL HIGH (ref 65–99)
Glucose-Capillary: 124 mg/dL — ABNORMAL HIGH (ref 65–99)
Glucose-Capillary: 126 mg/dL — ABNORMAL HIGH (ref 65–99)

## 2017-07-15 LAB — HEPARIN LEVEL (UNFRACTIONATED): Heparin Unfractionated: 0.42 IU/mL (ref 0.30–0.70)

## 2017-07-15 LAB — TROPONIN I: Troponin I: 4.29 ng/mL (ref <0.03)

## 2017-07-15 MED ORDER — ALBUMIN HUMAN 25 % IV SOLN
25.0000 g | Freq: Once | INTRAVENOUS | Status: AC
  Administered 2017-07-15: 25 g via INTRAVENOUS
  Filled 2017-07-15: qty 100

## 2017-07-15 MED ORDER — HEPARIN SODIUM (PORCINE) 1000 UNIT/ML DIALYSIS
40.0000 [IU]/kg | INTRAMUSCULAR | Status: DC | PRN
  Administered 2017-07-15: 2800 [IU] via INTRAVENOUS_CENTRAL

## 2017-07-15 MED ORDER — HEPARIN SODIUM (PORCINE) 1000 UNIT/ML DIALYSIS: 40.0000 [IU]/kg | INTRAMUSCULAR | Status: DC | PRN

## 2017-07-15 MED ORDER — CALCIUM ACETATE (PHOS BINDER) 667 MG PO CAPS
667.0000 mg | ORAL_CAPSULE | Freq: Three times a day (TID) | ORAL | Status: DC
Start: 2017-07-15 — End: 2017-07-19
  Administered 2017-07-16 – 2017-07-19 (×6): 667 mg via ORAL
  Filled 2017-07-15 (×7): qty 1

## 2017-07-15 MED ORDER — ALBUMIN HUMAN 25 % IV SOLN
INTRAVENOUS | Status: AC
  Administered 2017-07-15: 25 g via INTRAVENOUS
  Filled 2017-07-15: qty 100

## 2017-07-15 MED ORDER — RENA-VITE PO TABS
1.0000 | ORAL_TABLET | Freq: Every day | ORAL | Status: DC
  Administered 2017-07-15 – 2017-07-18 (×4): 1 via ORAL
  Filled 2017-07-15 (×4): qty 1

## 2017-07-15 MED ORDER — SODIUM CHLORIDE 0.9 % IV SOLN
125.0000 mg | INTRAVENOUS | Status: DC
Start: 2017-07-15 — End: 2017-07-19
  Administered 2017-07-18: 125 mg via INTRAVENOUS
  Filled 2017-07-15 (×3): qty 10

## 2017-07-15 NOTE — Progress Notes (Addendum)
PROGRESS NOTE    Douglas Meyer  DJM:[id number] DOB: [birth year] DOA: 07/13/2017 PCP: Dorena Cookey, MD   Brief Narrative: Douglas Meyer is a 77 y.o.  male with history of chronic kidney disease stage V, CAD, gout, diabetes mellitus, hyperlipidemia, anemia. He presented secondary to elevated creatinine and BUN seen at PCP's office concerning for acute kidney injury. No mental status changes. Nephrology consulted and hemodialysis started.   Assessment & Plan:   Principal Problem:   Renal failure (ARF), acute on chronic (HCC) Active Problems:   Insulin dependent diabetes mellitus ([place])   Gout   Essential hypertension   Peripheral vascular disease ([place])   Acute kidney injury on CKD 5 Uremia Patient previously considered for peritoneal dialysis. Baseline creatinine of about 4.1-4.8. 10.00 on admission with severely elevated BUN. No mental status changes. Oliguric. -Nephrology recommendations: IHD -UOP (strict in/out)  Diabetes mellitus, insulin dependent Last hemoglobin A1C of 7.6%. Episode of hypoglycemia yesterday. For his age, his hemoglobin A1C is adequate. -Continue SSI and Lantus -Discontinue glipizide on discharge.  Essential hypertension Blood pressure on low side -Continue to hold antihypertensives  Peripheral vascular disease CAD No chest pain. Troponin elevated. -Continue aspirin and Lipitor -lipid panel in AM  Gout Stable.  Anemia Normocytic. Secondary to chronic disease from kidney disease. Stable. No evidence of bleeding. Iron and ferritin within normal limits.  Elevated troponin Some ST changes likely secondary to LVH. Troponin significantly elevated in setting of significant kidney injury. No chest pain or equivalent. Repeat EKG with ST depressions in inferior/lateral leads. Concern for NSTEMI. Patient on aspirin and Lipitor  -Cardiology recommendations: Heparin IV -Continue aspirin -Echocardiogram pending   DVT prophylaxis: Heparin IV Code Status:  Full code Family Communication: None at bedside Disposition Plan: Pending renal management/cardiology workup   Consultants:   Nephrology  Cardiology  Procedures:   Hemodialysis  Antimicrobials:  None    Subjective: No chest pain, dyspnea or palpitations.  Objective: Vitals:   07/15/17 0400 07/15/17 0420 07/15/17 0457 07/15/17 0530  BP: (!) 93/52 (!) 87/49 (!) 90/54 (!) 88/48  Pulse: 89 84 90 88  Resp: 15 16 19 18   Temp:   98 F (36.7 C) 98.9 F (37.2 C)  TempSrc:   Oral Oral  SpO2: 97% 96% 95% 96%  Weight:   69.1 kg (152 lb 5.4 oz)   Height:        Intake/Output Summary (Last 24 hours) at 07/15/2017 0940 Last data filed at 07/15/2017 0600 Gross per 24 hour  Intake 144.53 ml  Output -801 ml  Net 945.53 ml   Filed Weights   07/14/17 2114 07/15/17 0105 07/15/17 0457  Weight: 67 kg (147 lb 11.2 oz) 68.3 kg (150 lb 9.2 oz) 69.1 kg (152 lb 5.4 oz)    Examination:  General exam: Appears calm and comfortable  Respiratory: Clear to auscultation bilaterally. Unlabored work of breathing. No wheezing or rales. Cardiovascular: Regular rate and rhythm. Normal S1 and S2. No heart murmurs present. No extra heart sounds Gastrointestinal: Soft, non-tender, non-distended, no guarding, no rebound, no masses felt Neuro: no focal deficits Psych: normal mood   Data Reviewed: I have personally reviewed following labs and imaging studies  CBC: Recent Labs  Lab 07/12/17 0949 07/12/17 1832 07/13/17 0332 07/14/17 0546 07/15/17 0216 07/15/17 0742  WBC 9.9 8.4 8.3 6.0 7.1 8.1  NEUTROABS 7.6  --   --   --   --   --   HGB 10.0* 10.0* 9.7* 9.4* 9.1* 8.8*  HCT 30.1* 30.9*  29.7* 28.6* 27.3* 27.2*  MCV 95.2 93.6 92.8 94.1 92.9 94.1  PLT 224.0 246 245 223 236 003   Basic Metabolic Panel: Recent Labs  Lab 07/12/17 0949 07/12/17 1832 07/13/17 0332 07/14/17 0546 07/15/17 0215  NA 135 135 134* 136 134*  K 4.6 4.8 4.3 4.0 4.1  CL 95* 98* 97* 98* 98*  CO2 21 17* 16* 21* 18*    GLUCOSE 110* 93 96 110* 164*  BUN >130* 176* 172* 107* 117*  CREATININE 10.00* 9.99* 10.03* 7.72* 7.96*  CALCIUM 9.1 9.4 9.0 8.7* 8.7*  PHOS  --   --   --  6.3* 6.6*   GFR: Estimated Creatinine Clearance: 7 mL/min (A) (by C-G formula based on SCr of 7.96 mg/dL (H)). Liver Function Tests: Recent Labs  Lab 07/14/17 0546 07/15/17 0215  ALBUMIN 2.7* 2.6*   No results for input(s): LIPASE, AMYLASE in the last 168 hours. No results for input(s): AMMONIA in the last 168 hours. Coagulation Profile: No results for input(s): INR, PROTIME in the last 168 hours. Cardiac Enzymes: Recent Labs  Lab 07/13/17 0900 07/14/17 1605 07/15/17 0215  TROPONINI 3.70* 4.40* 4.29*   BNP (last 3 results) No results for input(s): PROBNP in the last 8760 hours. HbA1C: Recent Labs    07/12/17 0949  HGBA1C 7.6*   CBG: Recent Labs  Lab 07/14/17 1213 07/14/17 1742 07/14/17 2219 07/14/17 2329 07/15/17 0759  GLUCAP 88 87 57* 129* 166*   Lipid Profile: No results for input(s): CHOL, HDL, LDLCALC, TRIG, CHOLHDL, LDLDIRECT in the last 72 hours. Thyroid Function Tests: Recent Labs    07/12/17 0949  TSH 4.66*  FREET4 0.89  T3FREE 3.1   Anemia Panel: Recent Labs    07/12/17 0949 07/14/17 1107  VITAMINB12 >1500*  --   FERRITIN 255.7  --   TIBC  --  230*  IRON 52 32*   Sepsis Labs: No results for input(s): PROCALCITON, LATICACIDVEN in the last 168 hours.  No results found for this or any previous visit (from the past 240 hour(s)).       Radiology Studies: No results found.      Scheduled Meds: . aspirin  81 mg Oral Daily  . atorvastatin  20 mg Oral q1800  . calcitRIOL  0.5 mcg Oral QODAY  . feeding supplement (NEPRO CARB STEADY)  237 mL Oral TID BM  . furosemide  80 mg Oral Daily  . insulin aspart  0-9 Units Subcutaneous TID WC  . insulin glargine  5 Units Subcutaneous Daily  . metoprolol  100 mg Oral Daily  . omega-3 acid ethyl esters  1 g Oral QHS   Continuous  Infusions: . sodium chloride    . sodium chloride    . heparin 800 Units/hr (07/15/17 0056)     LOS: 2 days     Cordelia Poche, MD [facility] 07/15/2017, 9:40 AM Pager: [phone]  If 7PM-7AM, please contact night-coverage [email or web address] Password [email or web address] 07/15/2017, 9:40 AM

## 2017-07-15 NOTE — Procedures (Signed)
Patient seen on Hemodialysis. QB 400, UF goal 3L Treatment adjusted as needed.  Oleta Gunnoe MD [facility]. Office # [phone] Pager # [phone] 8:00 AM  

## 2017-07-15 NOTE — Progress Notes (Signed)
Patient ID: Douglas Meyer, male   DOB: 22-Apr-1941, 77 y.o.   MRN: [id number]  [facility] Progress Note   Assessment/ Plan:   1. End-stage renal disease: with progressive chronic kidney disease and admitted for initiation of hemodialysis because of multiple uremic symptoms. Reportedly feeling better and had late hemodialysis overnight. We'll plan for his next/third hemodialysis treatment later today or may defer until Monday depending on patient load. Volume status appears to be acceptable. Process underway for outpatient dialysis unit placement with his ultimate goal being transitioning to peritoneal dialysis. 2. Hypotension- noted to have hypotension with hemodialysis overnight in spite of holding his antihypertensive agents-received some albumin with decreased ultrafiltration goal. 3. Anemia: Iron deficiency noted with low iron saturation/permissive ferritin-will give intravenous iron.  4. Coronary artery disease - troponin level elevated but appears to be rather stable and not rising critically-defer management to cardiology.  5. Secondary hyperparathyroidism: on calcitriol for PTH suppression,phosphorus levels marginally elevated, begin binder 6. Nutrition: on Nepro supplementation, begin renal vitamin.  Subjective:   Underwent hemodialysis late last night/early this morning and reports to be feeling tired from inadequate sleep.   Objective:   BP (!) 87/62 (BP Location: Right Arm)   Pulse 89   Temp 98.3 F (36.8 C) (Oral)   Resp 16   Ht 5\' 6"  (1.676 m)   Wt 69.1 kg (152 lb 5.4 oz)   SpO2 96%   BMI 24.59 kg/m   Physical Exam: ATF:[id number] sleeping in bed CWC:BJSEG regular rhythm, normal rate, S1 and S2 normal Resp:clear to auscultation, no rales/rhonchi BTD:VVOH, flat, nontender Ext: no lower extremity edema, left upper arm brachiocephalic fistula with poor augmentation and low pitched outflow bruit  Labs: BMET Recent Labs  Lab 07/12/17 0949 07/12/17 1832  07/13/17 0332 07/14/17 0546 07/15/17 0215  NA 135 135 134* 136 134*  K 4.6 4.8 4.3 4.0 4.1  CL 95* 98* 97* 98* 98*  CO2 21 17* 16* 21* 18*  GLUCOSE 110* 93 96 110* 164*  BUN >130* 176* 172* 107* 117*  CREATININE 10.00* 9.99* 10.03* 7.72* 7.96*  CALCIUM 9.1 9.4 9.0 8.7* 8.7*  PHOS  --   --   --  6.3* 6.6*   CBC Recent Labs  Lab 07/12/17 0949  07/13/17 0332 07/14/17 0546 07/15/17 0216 07/15/17 0742  WBC 9.9   < > 8.3 6.0 7.1 8.1  NEUTROABS 7.6  --   --   --   --   --   HGB 10.0*   < > 9.7* 9.4* 9.1* 8.8*  HCT 30.1*   < > 29.7* 28.6* 27.3* 27.2*  MCV 95.2   < > 92.8 94.1 92.9 94.1  PLT 224.0   < > 245 223 236 198   < > = values in this interval not displayed.   Medications:    . aspirin  81 mg Oral Daily  . atorvastatin  20 mg Oral q1800  . calcitRIOL  0.5 mcg Oral QODAY  . feeding supplement (NEPRO CARB STEADY)  237 mL Oral TID BM  . furosemide  80 mg Oral Daily  . insulin aspart  0-9 Units Subcutaneous TID WC  . insulin glargine  5 Units Subcutaneous Daily  . metoprolol  100 mg Oral Daily  . omega-3 acid ethyl esters  1 g Oral QHS   Elmarie Shiley, MD 07/15/2017, 12:00 PM

## 2017-07-15 NOTE — Progress Notes (Signed)
Received call from central telemetry that patient had become bradycardic. Assessed patient. Found vomiting after eating a couple bites of dinner. HR 87. MD messaged. Will continue to monitor. Bartholomew Crews, RN

## 2017-07-15 NOTE — Progress Notes (Signed)
HD tx initiated via 17Gx2 w/o problem, pull/push/flush equally w/o problem pt has had bp in 90's all day, he arrived to unit 80/48, pt was asymptomatic, Dr. Posey Pronto was already made aware of low bp priror to his arrival and ordered Albumin at start of tx, will cont to monitor while on HD tx

## 2017-07-15 NOTE — Progress Notes (Signed)
ANTICOAGULATION CONSULT NOTE - Initial Consult  Pharmacy Consult for heparin Indication: chest pain/ACS  No Known Allergies  Patient Measurements: Height: 5\' 6"  (167.6 cm) Weight: 152 lb 5.4 oz (69.1 kg) IBW/kg (Calculated) : 63.8 Heparin Dosing Weight: 68 kg  Vital Signs: Temp: 98.9 F (37.2 C) (02/09 0530) Temp Source: Oral (02/09 0530) BP: 88/48 (02/09 0530) Pulse Rate: 88 (02/09 0530)  Labs: Recent Labs    07/13/17 0332 07/13/17 0900 07/14/17 0546 07/14/17 1605 07/15/17 0215 07/15/17 0216 07/15/17 0742  HGB 9.7*  --  9.4*  --   --  9.1* 8.8*  HCT 29.7*  --  28.6*  --   --  27.3* 27.2*  PLT 245  --  223  --   --  236 198  HEPARINUNFRC  --   --   --   --   --   --  0.42  CREATININE 10.03*  --  7.72*  --  7.96*  --   --   TROPONINI  --  3.70*  --  4.40* 4.29*  --   --      Assessment: Admitted 2/7, cardiology consulted for troponin elevation, no chest pain.   CBC low but stable, no bleeding observed at this time.  Heparin level therapeutic at 0.42 on 800 units/hr   Goal of Therapy:  Heparin level 0.3-0.7 units/ml Monitor platelets by anticoagulation protocol: Yes    Plan:  Continue heparin gtt at 800 units/hr Monitor daily heparin level, CBC, s/s bleeding  Bertis Ruddy, PharmD Pharmacy Resident Pager #: [phone] 07/15/2017 8:48 AM

## 2017-07-15 NOTE — Progress Notes (Signed)
HD tx completed w/ bp issues throughout tx that didn't respond to any intervention that was given, MD was made aware and states as long as pt was asymptomatic in the 80's let him continue to run w/ UF off and he would check the meds when he rounded in the morning. Pt continued to stay asymptomatic throughout tx, UF goal not met, pt ended up being +937m, VSS w/ soft bp in 90's at post tx, report called to CRockie Neighbours RN

## 2017-07-15 NOTE — Progress Notes (Signed)
Progress Note  Patient Name: Douglas Meyer Date of Encounter: 07/15/2017  [place]   Subjective   No chest pain getting dialysis   Inpatient Medications    Scheduled Meds: . aspirin  81 mg Oral Daily  . atorvastatin  20 mg Oral q1800  . calcitRIOL  0.5 mcg Oral QODAY  . calcium acetate  667 mg Oral TID WC  . feeding supplement (NEPRO CARB STEADY)  237 mL Oral TID BM  . insulin aspart  0-9 Units Subcutaneous TID WC  . insulin glargine  5 Units Subcutaneous Daily  . metoprolol  100 mg Oral Daily  . multivitamin  1 tablet Oral QHS  . omega-3 acid ethyl esters  1 g Oral QHS   Continuous Infusions: . sodium chloride    . sodium chloride    . ferric gluconate (FERRLECIT/NULECIT) IV    . heparin 800 Units/hr (07/15/17 0056)   PRN Meds: sodium chloride, sodium chloride, acetaminophen **OR** acetaminophen, alteplase, heparin, heparin, heparin, lidocaine (PF), lidocaine-prilocaine, ondansetron **OR** ondansetron (ZOFRAN) IV, pentafluoroprop-tetrafluoroeth   Vital Signs    Vitals:   07/15/17 0420 07/15/17 0457 07/15/17 0530 07/15/17 1008  BP: (!) 87/49 (!) 90/54 (!) 88/48 (!) 87/62  Pulse: 84 90 88 89  Resp: 16 19 18 16   Temp:  98 F (36.7 C) 98.9 F (37.2 C) 98.3 F (36.8 C)  TempSrc:  Oral Oral Oral  SpO2: 96% 95% 96% 96%  Weight:  152 lb 5.4 oz (69.1 kg)    Height:        Intake/Output Summary (Last 24 hours) at 07/15/2017 1214 Last data filed at 07/15/2017 1025 Gross per 24 hour  Intake 315.86 ml  Output -801 ml  Net 1116.86 ml   Filed Weights   07/14/17 2114 07/15/17 0105 07/15/17 0457  Weight: 147 lb 11.2 oz (67 kg) 150 lb 9.2 oz (68.3 kg) 152 lb 5.4 oz (69.1 kg)    Telemetry    NSR no arrhythmia 07/15/2017  - Personally Reviewed  ECG    SR LVH with strain  - Personally Reviewed  Physical Exam  Chronically ill white male  GEN: No acute distress.   Neck: post right CEA with bilateral bruits  Cardiac: RRR, no murmurs, rubs, or gallops.  Respiratory:  Clear to auscultation bilaterally. GI: Soft, nontender, non-distended  MS: No edema; No deformity. Neuro:  Nonfocal  Psych: Normal affect  Fistula LUE with thrill   Labs    Chemistry Recent Labs  Lab 07/13/17 0332 07/14/17 0546 07/15/17 0215  NA 134* 136 134*  K 4.3 4.0 4.1  CL 97* 98* 98*  CO2 16* 21* 18*  GLUCOSE 96 110* 164*  BUN 172* 107* 117*  CREATININE 10.03* 7.72* 7.96*  CALCIUM 9.0 8.7* 8.7*  ALBUMIN  --  2.7* 2.6*  GFRNONAA 4* 6* 6*  GFRAA 5* 7* 7*  ANIONGAP 21* 17* 18*     Hematology Recent Labs  Lab 07/14/17 0546 07/15/17 0216 07/15/17 0742  WBC 6.0 7.1 8.1  RBC 3.04* 2.94* 2.89*  HGB 9.4* 9.1* 8.8*  HCT 28.6* 27.3* 27.2*  MCV 94.1 92.9 94.1  MCH 30.9 31.0 30.4  MCHC 32.9 33.3 32.4  RDW 15.5 15.2 15.3  PLT 223 236 198    Cardiac Enzymes Recent Labs  Lab 07/13/17 0900 07/14/17 1605 07/15/17 0215  TROPONINI 3.70* 4.40* 4.29*   No results for input(s): TROPIPOC in the last 168 hours.   BNPNo results for input(s): BNP, PROBNP in the last 168 hours.  DDimer No results for input(s): DDIMER in the last 168 hours.   Radiology    No results found.  Cardiac Studies  Echo pending   Patient Profile     77 y.o. 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 CAD:  Discussed with patient continue heparin favor diagnostic cath likely Monday Since he is now on dialysis If he has collaterals myovue will be positive and concern For current SEMI. Continue ASA heparin and beta blocker    For questions or updates, please contact [facility] Please consult [email or web address] for contact info under Cardiology/STEMI.      Signed, Jenkins Rouge, MD  07/15/2017, 12:14 PM

## 2017-07-16 ENCOUNTER — Inpatient Hospital Stay (HOSPITAL_COMMUNITY): Payer: Medicare Other

## 2017-07-16 DIAGNOSIS — I5021 Acute systolic (congestive) heart failure: Secondary | ICD-10-CM

## 2017-07-16 DIAGNOSIS — I340 Nonrheumatic mitral (valve) insufficiency: Secondary | ICD-10-CM

## 2017-07-16 LAB — CBC
HCT: 28.3 % — ABNORMAL LOW (ref 39.0–52.0)
Hemoglobin: 9 g/dL — ABNORMAL LOW (ref 13.0–17.0)
MCH: 30.7 pg (ref 26.0–34.0)
MCHC: 31.8 g/dL (ref 30.0–36.0)
MCV: 96.6 fL (ref 78.0–100.0)
PLATELETS: 193 10*3/uL (ref 150–400)
RBC: 2.93 MIL/uL — AB (ref 4.22–5.81)
RDW: 16 % — ABNORMAL HIGH (ref 11.5–15.5)
WBC: 7.9 10*3/uL (ref 4.0–10.5)

## 2017-07-16 LAB — HEPARIN LEVEL (UNFRACTIONATED)
Heparin Unfractionated: >2.2 IU/mL — ABNORMAL HIGH (ref 0.30–0.70)
Heparin Unfractionated: 0.13 IU/mL — ABNORMAL LOW (ref 0.30–0.70)
Heparin Unfractionated: 0.24 IU/mL — ABNORMAL LOW (ref 0.30–0.70)

## 2017-07-16 LAB — ECHOCARDIOGRAM COMPLETE: Weight: 2412.71 oz

## 2017-07-16 LAB — RENAL FUNCTION PANEL
ANION GAP: 18 — AB (ref 5–15)
Albumin: 3 g/dL — ABNORMAL LOW (ref 3.5–5.0)
BUN: 63 mg/dL — AB (ref 6–20)
CALCIUM: 9.1 mg/dL (ref 8.9–10.3)
CO2: 23 mmol/L (ref 22–32)
Chloride: 100 mmol/L — ABNORMAL LOW (ref 101–111)
Creatinine, Ser: 6.21 mg/dL — ABNORMAL HIGH (ref 0.61–1.24)
GFR calc Af Amer: 9 mL/min — ABNORMAL LOW (ref >60)
GFR calc non Af Amer: 8 mL/min — ABNORMAL LOW (ref >60)
GLUCOSE: 105 mg/dL — AB (ref 65–99)
Phosphorus: 6.5 mg/dL — ABNORMAL HIGH (ref 2.5–4.6)
Potassium: 4.2 mmol/L (ref 3.5–5.1)
SODIUM: 141 mmol/L (ref 135–145)

## 2017-07-16 LAB — LIPID PANEL
CHOL/HDL RATIO: 2.6 ratio
CHOLESTEROL: 87 mg/dL (ref 0–200)
HDL: 33 mg/dL — ABNORMAL LOW (ref >40)
LDL Cholesterol: 38 mg/dL (ref 0–99)
Triglycerides: 78 mg/dL (ref <150)
VLDL: 16 mg/dL (ref 0–40)

## 2017-07-16 LAB — GLUCOSE, CAPILLARY
Glucose-Capillary: 101 mg/dL — ABNORMAL HIGH (ref 65–99)
Glucose-Capillary: 150 mg/dL — ABNORMAL HIGH (ref 65–99)
Glucose-Capillary: 108 mg/dL — ABNORMAL HIGH (ref 65–99)

## 2017-07-16 MED ORDER — INSULIN GLARGINE 100 UNIT/ML ~~LOC~~ SOLN
5.0000 [IU] | Freq: Every day | SUBCUTANEOUS | Status: DC
  Administered 2017-07-18 – 2017-07-19 (×2): 5 [IU] via SUBCUTANEOUS
  Filled 2017-07-16 (×2): qty 0.05

## 2017-07-16 MED ORDER — METOPROLOL SUCCINATE ER 50 MG PO TB24
75.0000 mg | ORAL_TABLET | Freq: Every day | ORAL | Status: DC
  Administered 2017-07-17: 21:00:00 75 mg via ORAL
  Filled 2017-07-16: qty 1

## 2017-07-16 NOTE — Progress Notes (Signed)
PROGRESS NOTE    Douglas Meyer  DXI:[id number] DOB: Jan 02, 1941 DOA: 07/13/2017 PCP: Dorena Cookey, MD   Brief Narrative: Douglas Meyer is a 77 y.o.  male with history of chronic kidney disease stage V, CAD, gout, diabetes mellitus, hyperlipidemia, anemia. He presented secondary to elevated creatinine and BUN seen at PCP's office concerning for acute kidney injury. No mental status changes. Nephrology consulted and hemodialysis started.   Assessment & Plan:   Principal Problem:   Renal failure (ARF), acute on chronic (HCC) Active Problems:   Insulin dependent diabetes mellitus ([place])   Gout   Essential hypertension   Peripheral vascular disease (HCC)   Coronary artery disease involving native coronary artery of native heart without angina pectoris   Elevated troponin   Acute kidney injury on CKD 5 Uremia Patient previously considered for peritoneal dialysis. Baseline creatinine of about 4.1-4.8. 10.00 on admission with severely elevated BUN. No mental status changes. Oliguric. -Nephrology recommendations: IHD -UOP (strict in/out)  Diabetes mellitus, insulin dependent Last hemoglobin A1C of 7.6%. For his age, his hemoglobin A1C is adequate. No hypoglycemia overnight. -Continue SSI and Lantus -Discontinue glipizide on discharge.  Essential hypertension Blood pressure on low side -Continue to hold antihypertensives  Peripheral vascular disease CAD No chest pain. Troponin elevated. -Continue aspirin and Lipitor -lipid panel in AM  Gout Stable.  Anemia Normocytic. Secondary to chronic disease from kidney disease. Stable. No evidence of bleeding. Iron and ferritin within normal limits.  Elevated troponin Initial EKG with ST changes likely secondary to LVH. Troponin significantly elevated in setting of significant kidney injury. No chest pain or equivalent. Repeat EKG with ST depressions in inferior/lateral leads. Concern for NSTEMI. Patient on aspirin and Lipitor    -Cardiology recommendations: Heparin IV -Continue aspirin -Echocardiogram pending   DVT prophylaxis: Heparin IV Code Status: Full code Family Communication: None at bedside Disposition Plan: Pending renal management/cardiology workup   Consultants:   Nephrology  Cardiology  Procedures:   Hemodialysis  Antimicrobials:  None    Subjective: No chest pain, dyspnea or palpitations.  Objective: Vitals:   07/15/17 1008 07/15/17 1709 07/15/17 2215 07/16/17 0431  BP: (!) 87/62 (!) 90/45 (!) 93/50 (!) 90/48  Pulse: 89 78 81 76  Resp: 16 16 16 17   Temp: 98.3 F (36.8 C) 98 F (36.7 C) 98.5 F (36.9 C) 98 F (36.7 C)  TempSrc: Oral Oral Oral Oral  SpO2: 96% 94% 94% 92%  Weight:   68.4 kg (150 lb 12.7 oz)   Height:        Intake/Output Summary (Last 24 hours) at 07/16/2017 1001 Last data filed at 07/16/2017 0600 Gross per 24 hour  Intake 688 ml  Output 375 ml  Net 313 ml   Filed Weights   07/15/17 0105 07/15/17 0457 07/15/17 2215  Weight: 68.3 kg (150 lb 9.2 oz) 69.1 kg (152 lb 5.4 oz) 68.4 kg (150 lb 12.7 oz)    Examination:  General exam: Appears calm and comfortable  Respiratory: Clear to auscultation bilaterally. Unlabored work of breathing. No wheezing or rales. Cardiovascular: Regular rate and rhythm. Normal S1 and S2. No heart murmurs present. No extra heart sounds Gastrointestinal: Soft, non-tender, non-distended, no guarding, no rebound, no masses felt Neuro: no focal deficits Psych: normal mood   Data Reviewed: I have personally reviewed following labs and imaging studies  CBC: Recent Labs  Lab 07/12/17 0949  07/13/17 0332 07/14/17 0546 07/15/17 0216 07/15/17 0742 07/16/17 0653  WBC 9.9   < > 8.3 6.0 7.1  8.1 7.9  NEUTROABS 7.6  --   --   --   --   --   --   HGB 10.0*   < > 9.7* 9.4* 9.1* 8.8* 9.0*  HCT 30.1*   < > 29.7* 28.6* 27.3* 27.2* 28.3*  MCV 95.2   < > 92.8 94.1 92.9 94.1 96.6  PLT 224.0   < > 245 223 236 198 193   < > = values  in this interval not displayed.   Basic Metabolic Panel: Recent Labs  Lab 07/12/17 1832 07/13/17 0332 07/14/17 0546 07/15/17 0215 07/16/17 0653  NA 135 134* 136 134* 141  K 4.8 4.3 4.0 4.1 4.2  CL 98* 97* 98* 98* 100*  CO2 17* 16* 21* 18* 23  GLUCOSE 93 96 110* 164* 105*  BUN 176* 172* 107* 117* 63*  CREATININE 9.99* 10.03* 7.72* 7.96* 6.21*  CALCIUM 9.4 9.0 8.7* 8.7* 9.1  PHOS  --   --  6.3* 6.6* 6.5*   GFR: Estimated Creatinine Clearance: 9 mL/min (A) (by C-G formula based on SCr of 6.21 mg/dL (H)). Liver Function Tests: Recent Labs  Lab 07/14/17 0546 07/15/17 0215 07/16/17 0653  ALBUMIN 2.7* 2.6* 3.0*   No results for input(s): LIPASE, AMYLASE in the last 168 hours. No results for input(s): AMMONIA in the last 168 hours. Coagulation Profile: No results for input(s): INR, PROTIME in the last 168 hours. Cardiac Enzymes: Recent Labs  Lab 07/13/17 0900 07/14/17 1605 07/15/17 0215  TROPONINI 3.70* 4.40* 4.29*   BNP (last 3 results) No results for input(s): PROBNP in the last 8760 hours. HbA1C: No results for input(s): HGBA1C in the last 72 hours. CBG: Recent Labs  Lab 07/15/17 0759 07/15/17 1229 07/15/17 1710 07/15/17 2214 07/16/17 0741  GLUCAP 166* 190* 124* 126* 101*   Lipid Profile: Recent Labs    07/16/17 0653  CHOL 87  HDL 33*  LDLCALC 38  TRIG 78  CHOLHDL 2.6   Thyroid Function Tests: No results for input(s): TSH, T4TOTAL, FREET4, T3FREE, THYROIDAB in the last 72 hours. Anemia Panel: Recent Labs    07/14/17 1107  TIBC 230*  IRON 32*   Sepsis Labs: No results for input(s): PROCALCITON, LATICACIDVEN in the last 168 hours.  No results found for this or any previous visit (from the past 240 hour(s)).       Radiology Studies: No results found.      Scheduled Meds: . aspirin  81 mg Oral Daily  . atorvastatin  20 mg Oral q1800  . calcitRIOL  0.5 mcg Oral QODAY  . calcium acetate  667 mg Oral TID WC  . feeding supplement  (NEPRO CARB STEADY)  237 mL Oral TID BM  . insulin aspart  0-9 Units Subcutaneous TID WC  . insulin glargine  5 Units Subcutaneous Daily  . metoprolol  100 mg Oral Daily  . multivitamin  1 tablet Oral QHS  . omega-3 acid ethyl esters  1 g Oral QHS   Continuous Infusions: . sodium chloride    . sodium chloride    . ferric gluconate (FERRLECIT/NULECIT) IV    . heparin 800 Units/hr (07/16/17 0516)     LOS: 3 days     Cordelia Poche, MD [facility] 07/16/2017, 10:01 AM Pager: [phone]  If 7PM-7AM, please contact night-coverage [email or web address] Password [email or web address] 07/16/2017, 10:01 AM

## 2017-07-16 NOTE — H&P (View-Only) (Signed)
Progress Note  Patient Name: Douglas Meyer Date of Encounter: 07/16/2017  [facility]   Subjective   No dyspnea or chest pain may get dialysis today if schedule permits   Inpatient Medications    Scheduled Meds: . aspirin  81 mg Oral Daily  . atorvastatin  20 mg Oral q1800  . calcitRIOL  0.5 mcg Oral QODAY  . calcium acetate  667 mg Oral TID WC  . feeding supplement (NEPRO CARB STEADY)  237 mL Oral TID BM  . insulin aspart  0-9 Units Subcutaneous TID WC  . insulin glargine  5 Units Subcutaneous Daily  . [START ON 07/17/2017] metoprolol  75 mg Oral QHS  . multivitamin  1 tablet Oral QHS  . omega-3 acid ethyl esters  1 g Oral QHS   Continuous Infusions: . sodium chloride    . sodium chloride    . ferric gluconate (FERRLECIT/NULECIT) IV    . heparin 800 Units/hr (07/16/17 0516)   PRN Meds: sodium chloride, sodium chloride, acetaminophen **OR** acetaminophen, alteplase, heparin, heparin, heparin, lidocaine (PF), lidocaine-prilocaine, ondansetron **OR** ondansetron (ZOFRAN) IV, pentafluoroprop-tetrafluoroeth   Vital Signs    Vitals:   07/15/17 1709 07/15/17 2215 07/16/17 0431 07/16/17 0900  BP: (!) 90/45 (!) 93/50 (!) 90/48 (!) 101/54  Pulse: 78 81 76 81  Resp: 16 16 17 18   Temp: 98 F (36.7 C) 98.5 F (36.9 C) 98 F (36.7 C) 98.8 F (37.1 C)  TempSrc: Oral Oral Oral Oral  SpO2: 94% 94% 92% 95%  Weight:  150 lb 12.7 oz (68.4 kg)    Height:        Intake/Output Summary (Last 24 hours) at 07/16/2017 1105 Last data filed at 07/16/2017 0900 Gross per 24 hour  Intake 516.67 ml  Output 375 ml  Net 141.67 ml   Filed Weights   07/15/17 0105 07/15/17 0457 07/15/17 2215  Weight: 150 lb 9.2 oz (68.3 kg) 152 lb 5.4 oz (69.1 kg) 150 lb 12.7 oz (68.4 kg)    Telemetry    NSR no arrhythmia 07/16/2017  - Personally Reviewed  ECG    SR LVH with strain  - Personally Reviewed  Physical Exam  Affect appropriate Pale white male  HEENT: normal Neck supple with no  adenopathy JVP normal no bruits no thyromegaly Lungs clear with no wheezing and good diaphragmatic motion Heart:  S1/S2 no murmur, no rub, gallop or click PMI normal Abdomen: benighn, BS positve, no tenderness, no AAA no bruit.  No HSM or HJR Distal pulses intact with no bruits No edema Neuro non-focal Skin warm and dry No muscular weakness Left upper arm brachiocephalic fistula poor thrill    Labs    Chemistry Recent Labs  Lab 07/14/17 0546 07/15/17 0215 07/16/17 0653  NA 136 134* 141  K 4.0 4.1 4.2  CL 98* 98* 100*  CO2 21* 18* 23  GLUCOSE 110* 164* 105*  BUN 107* 117* 63*  CREATININE 7.72* 7.96* 6.21*  CALCIUM 8.7* 8.7* 9.1  ALBUMIN 2.7* 2.6* 3.0*  GFRNONAA 6* 6* 8*  GFRAA 7* 7* 9*  ANIONGAP 17* 18* 18*     Hematology Recent Labs  Lab 07/15/17 0216 07/15/17 0742 07/16/17 0653  WBC 7.1 8.1 7.9  RBC 2.94* 2.89* 2.93*  HGB 9.1* 8.8* 9.0*  HCT 27.3* 27.2* 28.3*  MCV 92.9 94.1 96.6  MCH 31.0 30.4 30.7  MCHC 33.3 32.4 31.8  RDW 15.2 15.3 16.0*  PLT 236 198 193    Cardiac Enzymes Recent Labs  Lab  07/13/17 0900 07/14/17 1605 07/15/17 0215  TROPONINI 3.70* 4.40* 4.29*   No results for input(s): TROPIPOC in the last 168 hours.   BNPNo results for input(s): BNP, PROBNP in the last 168 hours.   DDimer No results for input(s): DDIMER in the last 168 hours.   Radiology    No results found.  Cardiac Studies  EF 25% mild MR diffuse hypokineiss worse in mid and basal inferior wall   Patient Profile     77 y.o. 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 CAD:  Old MI likely inferior marked change in EF by echo reviewed today 25% Will arrange right and left cath for am now that he is on dialysis Discussed  risks Willing to proceed placed on board orders written 2. CRF:  Fistula with weak thrill per nephrology would be nice to dialyze today for Cath tomorrow.    For questions or updates, please contact [place] Please consult [email or web address] for contact info under Cardiology/STEMI.      Signed, Jenkins Rouge, MD  07/16/2017, 11:05 AM

## 2017-07-16 NOTE — Progress Notes (Signed)
ANTICOAGULATION CONSULT NOTE - Initial Consult  Pharmacy Consult for heparin Indication: chest pain/ACS  No Known Allergies  Patient Measurements: Height: 5\' 6"  (167.6 cm) Weight: 150 lb 12.7 oz (68.4 kg) IBW/kg (Calculated) : 63.8 Heparin Dosing Weight: 68 kg  Vital Signs: Temp: 98.8 F (37.1 C) (02/10 0900) Temp Source: Oral (02/10 0900) BP: 101/54 (02/10 0900) Pulse Rate: 81 (02/10 0900)  Labs: Recent Labs    07/14/17 0546 07/14/17 1605 07/15/17 0215 07/15/17 0216 07/15/17 0742 07/16/17 0653 07/16/17 0955  HGB 9.4*  --   --  9.1* 8.8* 9.0*  --   HCT 28.6*  --   --  27.3* 27.2* 28.3*  --   PLT 223  --   --  236 198 193  --   HEPARINUNFRC  --   --   --   --  0.42 >2.20* 0.13*  CREATININE 7.72*  --  7.96*  --   --  6.21*  --   TROPONINI  --  4.40* 4.29*  --   --   --   --    Assessment: Mr. Stick is a 77 y/o M who presented on 2/7 with worsening renal function and elevated troponins on 2/8. ECHO on 2/10 shows EF of 25%, cards is planning a cardiac cath on 2/11. HD tentatively planned for 2/10 if HD schedule permits, if not, will get HD on Monday.  AM heparin level was supratherapeutic, but I believed to be a lab error, and the recheck level returned SUBtherapeutic at 0.13. CBC stable, no bleeding noted. Will increase current drip rate and recheck levels.  Goal of Therapy:  Heparin level 0.3-0.7 units/ml Monitor platelets by anticoagulation protocol: Yes   Plan:  Increase heparin gtt to 950 units/hr Monitor daily heparin level, CBC, s/sx bleeding Recheck HL at [place] PharmD PGY1 Pharmacy Practice Resident 07/16/2017 11:55 AM Pager: [phone] Phone: [phone]

## 2017-07-16 NOTE — Progress Notes (Signed)
Patient ID: Douglas Meyer, male   DOB: June 12, 1940, 77 y.o.   MRN: [id number]  [facility] Progress Note   Assessment/ Plan:   1. End-stage renal disease: with progressive chronic kidney disease and admitted for initiation of hemodialysis because of multiple uremic symptoms, underwent hemodialysis early Saturday morning/late Friday night and is on schedule for elective hemodialysis today that can be pushed off until tomorrow if the schedule is overwhelming. Continues to feel better with hemodialysis and states that his mental clarity is now closer to baseline. Process underway for outpatient dialysis unit placement with his ultimate goal being transitioning to peritoneal dialysis. 2. Hypotension- will decrease dose of metoprolol XL from 100 mg daily to 75 mg daily to allow blood pressures to run a little higher. 3. Anemia: Iron deficiency noted with low iron saturation/permissive ferritin-will give intravenous iron.  4. Coronary artery disease - troponin level elevated but appears to be rather stable and not rising critically-defer management to cardiology.  5. Secondary hyperparathyroidism: on calcitriol for PTH suppression, and started on calcium acetate for phosphorus binding 6. Nutrition: on Nepro supplementation, begin renal vitamin.  Subjective:   Reports to be feeling fair and inquires as to why he did not get hemodialysis done yesterday.   Objective:   BP (!) 90/48 (BP Location: Right Arm)   Pulse 76   Temp 98 F (36.7 C) (Oral)   Resp 17   Ht 5\' 6"  (1.676 m)   Wt 68.4 kg (150 lb 12.7 oz)   SpO2 92%   BMI 24.34 kg/m   Physical Exam: TZG:[id number] resting in bed, watching television-wife at bedside FFM:BWGYK regular rhythm, normal rate, S1 and S2 normal Resp:clear to auscultation, no rales/rhonchi ZLD:JTTS, flat, nontender Ext: no lower extremity edema, left upper arm brachiocephalic fistula with poor augmentation and low pitched outflow  bruit  Labs: BMET Recent Labs  Lab 07/12/17 0949 07/12/17 1832 07/13/17 0332 07/14/17 0546 07/15/17 0215 07/16/17 0653  NA 135 135 134* 136 134* 141  K 4.6 4.8 4.3 4.0 4.1 4.2  CL 95* 98* 97* 98* 98* 100*  CO2 21 17* 16* 21* 18* 23  GLUCOSE 110* 93 96 110* 164* 105*  BUN >130* 176* 172* 107* 117* 63*  CREATININE 10.00* 9.99* 10.03* 7.72* 7.96* 6.21*  CALCIUM 9.1 9.4 9.0 8.7* 8.7* 9.1  PHOS  --   --   --  6.3* 6.6* 6.5*   CBC Recent Labs  Lab 07/12/17 0949  07/14/17 0546 07/15/17 0216 07/15/17 0742 07/16/17 0653  WBC 9.9   < > 6.0 7.1 8.1 7.9  NEUTROABS 7.6  --   --   --   --   --   HGB 10.0*   < > 9.4* 9.1* 8.8* 9.0*  HCT 30.1*   < > 28.6* 27.3* 27.2* 28.3*  MCV 95.2   < > 94.1 92.9 94.1 96.6  PLT 224.0   < > 223 236 198 193   < > = values in this interval not displayed.   Medications:    . aspirin  81 mg Oral Daily  . atorvastatin  20 mg Oral q1800  . calcitRIOL  0.5 mcg Oral QODAY  . calcium acetate  667 mg Oral TID WC  . feeding supplement (NEPRO CARB STEADY)  237 mL Oral TID BM  . insulin aspart  0-9 Units Subcutaneous TID WC  . insulin glargine  5 Units Subcutaneous Daily  . metoprolol  100 mg Oral Daily  . multivitamin  1 tablet Oral QHS  .  omega-3 acid ethyl esters  1 g Oral QHS   Elmarie Shiley, MD 07/16/2017, 10:00 AM

## 2017-07-16 NOTE — Progress Notes (Signed)
ANTICOAGULATION CONSULT NOTE - Follow Up Consult  Pharmacy Consult for heparin Indication: chest pain/ACS  Labs: Recent Labs    07/14/17 0546 07/14/17 1605 07/15/17 0215 07/15/17 0216  07/15/17 5520 07/16/17 8022 07/16/17 0955 07/16/17 2132  HGB 9.4*  --   --  9.1*  --  8.8* 9.0*  --   --   HCT 28.6*  --   --  27.3*  --  27.2* 28.3*  --   --   PLT 223  --   --  236  --  198 193  --   --   HEPARINUNFRC  --   --   --   --    < > 0.42 >2.20* 0.13* 0.24*  CREATININE 7.72*  --  7.96*  --   --   --  6.21*  --   --   TROPONINI  --  4.40* 4.29*  --   --   --   --   --   --    < > = values in this interval not displayed.    Assessment: 77yo male remains subtherapeutic on heparin after rate change though closer to goal.  Goal of Therapy:  Heparin level 0.3-0.7 units/ml   Plan:  Will increase heparin gtt by 2 units/kg/hr to 1100 units/hr and check level in 8 hours.    Wynona Neat, PharmD, BCPS  07/16/2017,10:42 PM

## 2017-07-16 NOTE — Progress Notes (Signed)
Progress Note  Patient Name: Teon Hudnall Date of Encounter: 07/16/2017  [facility]rein   Subjective   No dyspnea or chest pain may get dialysis today if schedule permits   Inpatient Medications    Scheduled Meds: . aspirin  81 mg Oral Daily  . atorvastatin  20 mg Oral q1800  . calcitRIOL  0.5 mcg Oral QODAY  . calcium acetate  667 mg Oral TID WC  . feeding supplement (NEPRO CARB STEADY)  237 mL Oral TID BM  . insulin aspart  0-9 Units Subcutaneous TID WC  . insulin glargine  5 Units Subcutaneous Daily  . [START ON 07/17/2017] metoprolol  75 mg Oral QHS  . multivitamin  1 tablet Oral QHS  . omega-3 acid ethyl esters  1 g Oral QHS   Continuous Infusions: . sodium chloride    . sodium chloride    . ferric gluconate (FERRLECIT/NULECIT) IV    . heparin 800 Units/hr (07/16/17 0516)   PRN Meds: sodium chloride, sodium chloride, acetaminophen **OR** acetaminophen, alteplase, heparin, heparin, heparin, lidocaine (PF), lidocaine-prilocaine, ondansetron **OR** ondansetron (ZOFRAN) IV, pentafluoroprop-tetrafluoroeth   Vital Signs    Vitals:   07/15/17 1709 07/15/17 2215 07/16/17 0431 07/16/17 0900  BP: (!) 90/45 (!) 93/50 (!) 90/48 (!) 101/54  Pulse: 78 81 76 81  Resp: 16 16 17 18   Temp: 98 F (36.7 C) 98.5 F (36.9 C) 98 F (36.7 C) 98.8 F (37.1 C)  TempSrc: Oral Oral Oral Oral  SpO2: 94% 94% 92% 95%  Weight:  150 lb 12.7 oz (68.4 kg)    Height:        Intake/Output Summary (Last 24 hours) at 07/16/2017 1105 Last data filed at 07/16/2017 0900 Gross per 24 hour  Intake 516.67 ml  Output 375 ml  Net 141.67 ml   Filed Weights   07/15/17 0105 07/15/17 0457 07/15/17 2215  Weight: 150 lb 9.2 oz (68.3 kg) 152 lb 5.4 oz (69.1 kg) 150 lb 12.7 oz (68.4 kg)    Telemetry    NSR no arrhythmia 07/16/2017  - Personally Reviewed  ECG    SR LVH with strain  - Personally Reviewed  Physical Exam  Affect appropriate Pale white male  HEENT: normal Neck supple with no  adenopathy JVP normal no bruits no thyromegaly Lungs clear with no wheezing and good diaphragmatic motion Heart:  S1/S2 no murmur, no rub, gallop or click PMI normal Abdomen: benighn, BS positve, no tenderness, no AAA no bruit.  No HSM or HJR Distal pulses intact with no bruits No edema Neuro non-focal Skin warm and dry No muscular weakness Left upper arm brachiocephalic fistula poor thrill    Labs    Chemistry Recent Labs  Lab 07/14/17 0546 07/15/17 0215 07/16/17 0653  NA 136 134* 141  K 4.0 4.1 4.2  CL 98* 98* 100*  CO2 21* 18* 23  GLUCOSE 110* 164* 105*  BUN 107* 117* 63*  CREATININE 7.72* 7.96* 6.21*  CALCIUM 8.7* 8.7* 9.1  ALBUMIN 2.7* 2.6* 3.0*  GFRNONAA 6* 6* 8*  GFRAA 7* 7* 9*  ANIONGAP 17* 18* 18*     Hematology Recent Labs  Lab 07/15/17 0216 07/15/17 0742 07/16/17 0653  WBC 7.1 8.1 7.9  RBC 2.94* 2.89* 2.93*  HGB 9.1* 8.8* 9.0*  HCT 27.3* 27.2* 28.3*  MCV 92.9 94.1 96.6  MCH 31.0 30.4 30.7  MCHC 33.3 32.4 31.8  RDW 15.2 15.3 16.0*  PLT 236 198 193    Cardiac Enzymes Recent Labs  Lab  07/13/17 0900 07/14/17 1605 07/15/17 0215  TROPONINI 3.70* 4.40* 4.29*   No results for input(s): TROPIPOC in the last 168 hours.   BNPNo results for input(s): BNP, PROBNP in the last 168 hours.   DDimer No results for input(s): DDIMER in the last 168 hours.   Radiology    No results found.  Cardiac Studies  EF 25% mild MR diffuse hypokineiss worse in mid and basal inferior wall   Patient Profile     77 y.o. 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 CAD:  Old MI likely inferior marked change in EF by echo reviewed today 25% Will arrange right and left cath for am now that he is on dialysis Discussed  risks Willing to proceed placed on board orders written 2. CRF:  Fistula with weak thrill per nephrology would be nice to dialyze today for Cath tomorrow.    For questions or updates, please contact [place] Please consult [email or web address] for contact info under Cardiology/STEMI.      Signed, Jenkins Rouge, MD  07/16/2017, 11:05 AM

## 2017-07-16 NOTE — Evaluation (Signed)
Occupational Therapy Evaluation Patient Details Name: Douglas Meyer MRN: [id number] DOB: [birth year] Today's Date: 07/16/2017    History of Present Illness Pt is a 77 y/o male admitted secondary to Acute on chronic renal failure. Pt also with elevated troponin, however, per RN likely due to renal failure. PMH inlucdes R AV fistula placement, HTN, DM, gout, PVD, CAD, CKD V, and skin cancer.    Clinical Impression   Pt admitted with the above diagnoses and presents with below problem list. Pt will benefit from continued acute OT to address the below listed deficits and maximize independence with basic ADLs prior to d/c home. PTA pt was independent with ADLs, drives, volunteers. Pt presents with decreased activity tolerance, generalized weakness, and balance deficits impacting ADLs. Pt is currently min guard to min A with LB ADLs and functional transfers/mobiltiy.       Follow Up Recommendations  Home health OT;Supervision/Assistance - 24 hour    Equipment Recommendations  3 in 1 bedside commode    Recommendations for Other Services       Precautions / Restrictions Precautions Precautions: Fall Restrictions Weight Bearing Restrictions: No      Mobility Bed Mobility Overal bed mobility: Modified Independent             General bed mobility comments: Increased time, however, no external assist required.   Transfers Overall transfer level: Needs assistance Equipment used: None;Rolling walker (2 wheeled) Transfers: Sit to/from Stand Sit to Stand: Min guard         General transfer comment: Min guard for safety. Cues for safe hand placement with use of RW.     Balance Overall balance assessment: Needs assistance Sitting-balance support: No upper extremity supported;Feet supported Sitting balance-Leahy Scale: Good     Standing balance support: Bilateral upper extremity supported;No upper extremity supported;During functional activity Standing balance-Leahy Scale:  Fair Standing balance comment: Able to maintain static standing without UE support.                            ADL either performed or assessed with clinical judgement   ADL Overall ADL's : Needs assistance/impaired Eating/Feeding: Set up;Sitting   Grooming: Set up;Sitting   Upper Body Bathing: Set up;Sitting   Lower Body Bathing: Minimal assistance;Min guard;Sit to/from stand   Upper Body Dressing : Set up;Sitting   Lower Body Dressing: Min guard;Minimal assistance;Sit to/from stand   Toilet Transfer: Min guard;Minimal assistance;Ambulation;RW   Toileting- Water quality scientist and Hygiene: Min guard;Sit to/from stand   Tub/ Shower Transfer: Tub transfer;Minimal assistance;Ambulation   Functional mobility during ADLs: Min guard;Minimal assistance;Rolling walker General ADL Comments: Pt completed in-room functional mobility, simulated tub transfer. Pt fatigued with attempt at household distance ambulation, returned to room.      Vision         Perception     Praxis      Pertinent Vitals/Pain Pain Assessment: No/denies pain     Hand Dominance Right   Extremity/Trunk Assessment Upper Extremity Assessment Upper Extremity Assessment: Generalized weakness   Lower Extremity Assessment Lower Extremity Assessment: Generalized weakness   Cervical / Trunk Assessment Cervical / Trunk Assessment: Kyphotic   Communication Communication Communication: No difficulties   Cognition Arousal/Alertness: Awake/alert Behavior During Therapy: WFL for tasks assessed/performed Overall Cognitive Status: Within Functional Limits for tasks assessed  General Comments       Exercises     Shoulder Instructions      Home Living Family/patient expects to be discharged to:: Private residence Living Arrangements: Spouse/significant other Available Help at Discharge: Family;Available 24 hours/day Type of Home:  House Home Access: Stairs to enter [employer] of Steps: 3 Entrance Stairs-Rails: None Home Layout: One level     Bathroom Shower/Tub: Walk-in shower;Tub/shower unit   Bathroom Toilet: Standard     Home Equipment: None          Prior Functioning/Environment Level of Independence: Independent        Comments: drives, volunteers through his church        OT Problem List: Impaired balance (sitting and/or standing);Decreased knowledge of use of DME or AE;Decreased knowledge of precautions;Decreased activity tolerance;Cardiopulmonary status limiting activity      OT Treatment/Interventions: Self-care/ADL training;Therapeutic exercise;DME and/or AE instruction;Therapeutic activities;Patient/family education;Balance training    OT Goals(Current goals can be found in the care plan section) Acute Rehab OT Goals Patient Stated Goal: to walk more  OT Goal Formulation: With patient Time For Goal Achievement: 07/30/17 Potential to Achieve Goals: Good ADL Goals Pt Will Perform Grooming: with modified independence;standing Pt Will Perform Lower Body Bathing: with modified independence;sit to/from stand Pt Will Perform Lower Body Dressing: with modified independence;sit to/from stand Pt Will Transfer to Toilet: with modified independence;ambulating Pt Will Perform Toileting - Clothing Manipulation and hygiene: with modified independence;sit to/from stand Pt Will Perform Tub/Shower Transfer: Tub transfer;with modified independence;ambulating;rolling walker;3 in 1  OT Frequency: Min 2X/week   Barriers to D/C:            Co-evaluation              AM-PAC PT "6 Clicks" Daily Activity     Outcome Measure Help from another person eating meals?: None Help from another person taking care of personal grooming?: A Little Help from another person toileting, which includes using toliet, bedpan, or urinal?: A Little Help from another person bathing (including washing,  rinsing, drying)?: A Little Help from another person to put on and taking off regular upper body clothing?: None Help from another person to put on and taking off regular lower body clothing?: A Little 6 Click Score: 20   End of Session Equipment Utilized During Treatment: Rolling walker  Activity Tolerance: Patient limited by fatigue;Patient tolerated treatment well Patient left: in chair;with call bell/phone within reach;with chair alarm set  OT Visit Diagnosis: Unsteadiness on feet (R26.81);Muscle weakness (generalized) (M62.81)                Time: 1443-1540 OT Time Calculation (min): 22 min Charges:  OT General Charges $OT Visit: 1 Visit OT Evaluation $OT Eval Low Complexity: 1 Low G-Codes:      Hortencia Pilar 07/16/2017, 11:48 AM

## 2017-07-16 NOTE — Progress Notes (Signed)
  Echocardiogram 2D Echocardiogram has been performed.  Douglas Meyer T Douglas Meyer 07/16/2017, 9:40 AM

## 2017-07-17 ENCOUNTER — Inpatient Hospital Stay (HOSPITAL_COMMUNITY)
Admission: EM | Disposition: A | Discharge status: Home Health | Payer: Self-pay | Source: Home / Self Care | Attending: Family Medicine

## 2017-07-17 ENCOUNTER — Encounter (HOSPITAL_COMMUNITY): Payer: Self-pay | Admitting: Interventional Cardiology

## 2017-07-17 ENCOUNTER — Other Ambulatory Visit (HOSPITAL_COMMUNITY): Payer: Medicare Other

## 2017-07-17 ENCOUNTER — Other Ambulatory Visit: Payer: Self-pay | Admitting: *Deleted

## 2017-07-17 DIAGNOSIS — I214 Non-ST elevation (NSTEMI) myocardial infarction: Secondary | ICD-10-CM

## 2017-07-17 DIAGNOSIS — N181 Chronic kidney disease, stage 1: Secondary | ICD-10-CM

## 2017-07-17 DIAGNOSIS — I2510 Atherosclerotic heart disease of native coronary artery without angina pectoris: Secondary | ICD-10-CM

## 2017-07-17 DIAGNOSIS — I5021 Acute systolic (congestive) heart failure: Secondary | ICD-10-CM

## 2017-07-17 DIAGNOSIS — I255 Ischemic cardiomyopathy: Secondary | ICD-10-CM

## 2017-07-17 DIAGNOSIS — N170 Acute kidney failure with tubular necrosis: Secondary | ICD-10-CM

## 2017-07-17 DIAGNOSIS — E119 Type 2 diabetes mellitus without complications: Secondary | ICD-10-CM

## 2017-07-17 HISTORY — PX: RIGHT/LEFT HEART CATH AND CORONARY ANGIOGRAPHY: CATH118266

## 2017-07-17 LAB — CBC
HEMATOCRIT: 30.4 % — AB (ref 39.0–52.0)
Hemoglobin: 9.6 g/dL — ABNORMAL LOW (ref 13.0–17.0)
MCH: 30.6 pg (ref 26.0–34.0)
MCHC: 31.6 g/dL (ref 30.0–36.0)
MCV: 96.8 fL (ref 78.0–100.0)
PLATELETS: 198 10*3/uL (ref 150–400)
RBC: 3.14 MIL/uL — ABNORMAL LOW (ref 4.22–5.81)
RDW: 16.2 % — AB (ref 11.5–15.5)
WBC: 9.1 10*3/uL (ref 4.0–10.5)

## 2017-07-17 LAB — POCT I-STAT 3, VENOUS BLOOD GAS (G3P V)
Acid-Base Excess: 1 mmol/L (ref 0.0–2.0)
Bicarbonate: 25.7 mmol/L (ref 20.0–28.0)
O2 Saturation: 63 %
PH VEN: 7.434 — AB (ref 7.250–7.430)
TCO2: 27 mmol/L (ref 22–32)
pCO2, Ven: 38.4 mmHg — ABNORMAL LOW (ref 44.0–60.0)
pO2, Ven: 31 mmHg — CL (ref 32.0–45.0)

## 2017-07-17 LAB — RENAL FUNCTION PANEL
ALBUMIN: 3.2 g/dL — AB (ref 3.5–5.0)
Anion gap: 20 — ABNORMAL HIGH (ref 5–15)
BUN: 29 mg/dL — AB (ref 6–20)
CO2: 21 mmol/L — ABNORMAL LOW (ref 22–32)
Calcium: 9 mg/dL (ref 8.9–10.3)
Chloride: 96 mmol/L — ABNORMAL LOW (ref 101–111)
Creatinine, Ser: 4.49 mg/dL — ABNORMAL HIGH (ref 0.61–1.24)
GFR calc Af Amer: 13 mL/min — ABNORMAL LOW (ref >60)
GFR, EST NON AFRICAN AMERICAN: 11 mL/min — AB (ref >60)
GLUCOSE: 128 mg/dL — AB (ref 65–99)
PHOSPHORUS: 4.9 mg/dL — AB (ref 2.5–4.6)
POTASSIUM: 4.2 mmol/L (ref 3.5–5.1)
SODIUM: 137 mmol/L (ref 135–145)

## 2017-07-17 LAB — GLUCOSE, CAPILLARY
GLUCOSE-CAPILLARY: 120 mg/dL — AB (ref 65–99)
GLUCOSE-CAPILLARY: 152 mg/dL — AB (ref 65–99)
Glucose-Capillary: 127 mg/dL — ABNORMAL HIGH (ref 65–99)
Glucose-Capillary: 146 mg/dL — ABNORMAL HIGH (ref 65–99)

## 2017-07-17 LAB — POCT I-STAT 3, ART BLOOD GAS (G3+)
Bicarbonate: 23.9 mmol/L (ref 20.0–28.0)
O2 SAT: 94 %
PCO2 ART: 34.2 mmHg (ref 32.0–48.0)
TCO2: 25 mmol/L (ref 22–32)
pH, Arterial: 7.453 — ABNORMAL HIGH (ref 7.350–7.450)
pO2, Arterial: 66 mmHg — ABNORMAL LOW (ref 83.0–108.0)

## 2017-07-17 LAB — PROTIME-INR
INR: 1.13
Prothrombin Time: 14.4 seconds (ref 11.4–15.2)

## 2017-07-17 LAB — HEPARIN LEVEL (UNFRACTIONATED): Heparin Unfractionated: 0.31 IU/mL (ref 0.30–0.70)

## 2017-07-17 LAB — POCT ACTIVATED CLOTTING TIME: ACTIVATED CLOTTING TIME: 136 s

## 2017-07-17 SURGERY — RIGHT/LEFT HEART CATH AND CORONARY ANGIOGRAPHY
Anesthesia: LOCAL

## 2017-07-17 MED ORDER — HEPARIN (PORCINE) IN NACL 2-0.9 UNIT/ML-% IJ SOLN
INTRAMUSCULAR | Status: AC | PRN
  Administered 2017-07-17: 1000 mL

## 2017-07-17 MED ORDER — ONDANSETRON HCL 4 MG/2ML IJ SOLN: 4.0000 mg | Freq: Four times a day (QID) | INTRAMUSCULAR | Status: DC | PRN

## 2017-07-17 MED ORDER — LIDOCAINE HCL (PF) 1 % IJ SOLN
INTRAMUSCULAR | Status: DC | PRN
  Administered 2017-07-17: 15 mL

## 2017-07-17 MED ORDER — HEPARIN (PORCINE) IN NACL 100-0.45 UNIT/ML-% IJ SOLN
1200.0000 [IU]/h | INTRAMUSCULAR | Status: DC
Start: 2017-07-17 — End: 2017-07-18
  Administered 2017-07-17: 21:00:00 1100 [IU]/h via INTRAVENOUS
  Administered 2017-07-18: 17:00:00 1200 [IU]/h via INTRAVENOUS
  Filled 2017-07-17 (×2): qty 250

## 2017-07-17 MED ORDER — SODIUM CHLORIDE 0.9 % IV SOLN: INTRAVENOUS | Status: DC

## 2017-07-17 MED ORDER — MIDAZOLAM HCL 2 MG/2ML IJ SOLN
INTRAMUSCULAR | Status: DC | PRN
  Administered 2017-07-17: 1 mg via INTRAVENOUS

## 2017-07-17 MED ORDER — ACETAMINOPHEN 325 MG PO TABS: 650.0000 mg | ORAL_TABLET | ORAL | Status: DC | PRN

## 2017-07-17 MED ORDER — SODIUM CHLORIDE 0.9% FLUSH: 3.0000 mL | Freq: Two times a day (BID) | INTRAVENOUS | Status: DC

## 2017-07-17 MED ORDER — SODIUM CHLORIDE 0.9% FLUSH: 3.0000 mL | INTRAVENOUS | Status: DC | PRN

## 2017-07-17 MED ORDER — IOPAMIDOL (ISOVUE-370) INJECTION 76%
INTRAVENOUS | Status: DC | PRN
  Administered 2017-07-17: 34 mL via INTRA_ARTERIAL

## 2017-07-17 MED ORDER — SODIUM CHLORIDE 0.9 % IV SOLN
INTRAVENOUS | Status: DC
  Administered 2017-07-17: 06:00:00 via INTRAVENOUS

## 2017-07-17 MED ORDER — SODIUM CHLORIDE 0.9 % IV SOLN: 250.0000 mL | INTRAVENOUS | Status: DC | PRN

## 2017-07-17 MED ORDER — FENTANYL CITRATE (PF) 100 MCG/2ML IJ SOLN
INTRAMUSCULAR | Status: DC | PRN
  Administered 2017-07-17: 25 ug via INTRAVENOUS

## 2017-07-17 MED ORDER — HEPARIN (PORCINE) IN NACL 100-0.45 UNIT/ML-% IJ SOLN: 1100.0000 [IU]/h | INTRAMUSCULAR | Status: DC

## 2017-07-17 SURGICAL SUPPLY — 13 items
CATH INFINITI 5FR MULTPACK ANG (CATHETERS) ×2 IMPLANT
CATH SWAN GANZ 7F STRAIGHT (CATHETERS) ×2 IMPLANT
COVER PRB 48X5XTLSCP FOLD TPE (BAG) ×1 IMPLANT
COVER PROBE 5X48 (BAG) ×1
KIT HEART LEFT (KITS) ×2 IMPLANT
KIT PREMIUM HAND CONTROLLER (KITS) ×2 IMPLANT
KIT SINGLE USE MANIFOLD (KITS) ×2 IMPLANT
PACK CARDIAC CATHETERIZATION (CUSTOM PROCEDURE TRAY) ×2 IMPLANT
SHEATH PINNACLE 5F 10CM (SHEATH) ×2 IMPLANT
SHEATH PINNACLE 7F 10CM (SHEATH) ×2 IMPLANT
TRANSDUCER W/STOPCOCK (MISCELLANEOUS) ×2 IMPLANT
TUBING CIL FLEX 10 FLL-RA (TUBING) ×2 IMPLANT
WIRE EMERALD 3MM-J .035X150CM (WIRE) ×2 IMPLANT

## 2017-07-17 NOTE — Progress Notes (Signed)
Progress Note  Patient Name: Douglas Meyer Date of Encounter: 07/17/2017  Primary Cardiologist: Minus Breeding, MD   Subjective   Feeling well. No chest pain, sob or palpitations.   Inpatient Medications    Scheduled Meds: . aspirin  81 mg Oral Daily  . atorvastatin  20 mg Oral q1800  . calcitRIOL  0.5 mcg Oral QODAY  . calcium acetate  667 mg Oral TID WC  . feeding supplement (NEPRO CARB STEADY)  237 mL Oral TID BM  . insulin aspart  0-9 Units Subcutaneous TID WC  . [START ON 07/18/2017] insulin glargine  5 Units Subcutaneous Daily  . metoprolol  75 mg Oral QHS  . multivitamin  1 tablet Oral QHS  . omega-3 acid ethyl esters  1 g Oral QHS   Continuous Infusions: . sodium chloride 10 mL/hr at 07/17/17 0604  . ferric gluconate (FERRLECIT/NULECIT) IV    . heparin 1,100 Units/hr (07/16/17 2255)   PRN Meds: acetaminophen **OR** acetaminophen, ondansetron **OR** ondansetron (ZOFRAN) IV, sodium chloride flush   Vital Signs    Vitals:   07/16/17 2018 07/16/17 2206 07/17/17 0652 07/17/17 0855  BP: (!) 101/52 (!) 96/52 (!) 96/52 (!) 100/54  Pulse: 81 81 85 82  Resp: 17 16 17 16   Temp: 97.8 F (36.6 C) 98.3 F (36.8 C) 98.5 F (36.9 C) 98.3 F (36.8 C)  TempSrc: Oral Oral Oral Oral  SpO2: 94% 94% 94% 95%  Weight: 146 lb 6.2 oz (66.4 kg) 145 lb 6.4 oz (66 kg)    Height:        Intake/Output Summary (Last 24 hours) at 07/17/2017 0939 Last data filed at 07/17/2017 0604 Gross per 24 hour  Intake 690.01 ml  Output 2500 ml  Net -1809.99 ml   Filed Weights   07/16/17 1635 07/16/17 2018 07/16/17 2206  Weight: 152 lb 12.5 oz (69.3 kg) 146 lb 6.2 oz (66.4 kg) 145 lb 6.4 oz (66 kg)    Telemetry    NSR - Personally Reviewed  ECG    SR - Personally Reviewed  Physical Exam   GEN: No acute distress.   Neck: No JVD Cardiac: RRR, no murmurs, rubs, or gallops.  Respiratory: Clear to auscultation bilaterally. GI: Soft, nontender, non-distended  MS: No edema; No  deformity. Neuro:  Nonfocal  Psych: Normal affect   Labs    Chemistry Recent Labs  Lab 07/15/17 0215 07/16/17 0653 07/17/17 0710  NA 134* 141 137  K 4.1 4.2 4.2  CL 98* 100* 96*  CO2 18* 23 21*  GLUCOSE 164* 105* 128*  BUN 117* 63* 29*  CREATININE 7.96* 6.21* 4.49*  CALCIUM 8.7* 9.1 9.0  ALBUMIN 2.6* 3.0* 3.2*  GFRNONAA 6* 8* 11*  GFRAA 7* 9* 13*  ANIONGAP 18* 18* 20*     Hematology Recent Labs  Lab 07/15/17 0742 07/16/17 0653 07/17/17 0455  WBC 8.1 7.9 9.1  RBC 2.89* 2.93* 3.14*  HGB 8.8* 9.0* 9.6*  HCT 27.2* 28.3* 30.4*  MCV 94.1 96.6 96.8  MCH 30.4 30.7 30.6  MCHC 32.4 31.8 31.6  RDW 15.3 16.0* 16.2*  PLT 198 193 198    Cardiac Enzymes Recent Labs  Lab 07/13/17 0900 07/14/17 1605 07/15/17 0215  TROPONINI 3.70* 4.40* 4.29*   No results for input(s): TROPIPOC in the last 168 hours.   BNPNo results for input(s): BNP, PROBNP in the last 168 hours.   DDimer No results for input(s): DDIMER in the last 168 hours.   Radiology  No results found.  Cardiac Studies   Echo 07/16/17 Study Conclusions  - Left ventricle: Diffuse hypokinesis worse in the mid and basal   inferior wall. The cavity size was moderately dilated. Wall   thickness was normal. The estimated ejection fraction was 25%.   Doppler parameters are consistent with both elevated ventricular   end-diastolic filling pressure and elevated left atrial filling   pressure. - Mitral valve: There was mild regurgitation. - Left atrium: The atrium was moderately dilated. - Right ventricle: The cavity size was mildly dilated. - Right atrium: The atrium was mildly dilated. - Atrial septum: No defect or patent foramen ovale was identified. - Tricuspid valve: There was moderate regurgitation. - Pulmonary arteries: PA peak pressure: 38 mm Hg (S).  Patient Profile     Douglas Meyer is a 77 y.o. male with a hx of CAD with prior MI, ESRD on HD, DM2, PVD admitted with acute on chronic renal failure.  Troponin peaked at 4.4. EKG concerning for ST depression despite LVH with strain. Echo with newly depressed EF. For L & R cath after being on HD.  Assessment & Plan    1. Acute systolic CHF - Echo showed LVEF of 25%(was 50-60% in 2016). Given significant elevated troponin. The patient is for R & L cath today. Euvolemic.   2. CAD - no chest pain. - Continue ASA, BB and statin.  -07/16/2017: Cholesterol 87; HDL 33; LDL Cholesterol 38; Triglycerides 78; VLDL 16  3. ESRD - Underwent 3rd dialysis session last night.    For questions or updates, please contact [place] Please consult [email or web address] for contact info under Cardiology/STEMI.      Jarrett Soho, PA  07/17/2017, 9:39 AM

## 2017-07-17 NOTE — Progress Notes (Signed)
PT Cancellation Note  Patient Details Name: Douglas Meyer MRN: [id number] DOB: 04-27-1941   Cancelled Treatment:    Reason Eval/Treat Not Completed: Medical issues which prohibited therapy(Pt on bedrest until 4:30 pm.  Will return tomorrow. )   Godfrey Pick Sione Baumgarten 07/17/2017, 1:36 PM [place] Rilyn Upshaw,PT Acute Rehabilitation [phone] (pager)

## 2017-07-17 NOTE — Progress Notes (Signed)
PT Cancellation Note  Patient Details Name: Douglas Meyer MRN: [id number] DOB: 11-18-1940   Cancelled Treatment:    Reason Eval/Treat Not Completed: Patient at procedure or test/unavailable(Pt in CATH lab.  will return as able.  )   Denice Paradise 07/17/2017, 10:37 AM  [facility] [phone] (pager)

## 2017-07-17 NOTE — Progress Notes (Signed)
PROGRESS NOTE    Cu[place]oc Attar  YSA:[id number] DOB: 05/14/1941 DOA: 07/13/2017 PCP: Dorena Cookey, MD   Brief Narrative: Douglas Meyer is a 77 y.o.  male with history of chronic kidney disease stage V, CAD, gout, diabetes mellitus, hyperlipidemia, anemia. He presented secondary to elevated creatinine and BUN seen at PCP's office concerning for acute kidney injury. No mental status changes. Nephrology consulted and hemodialysis started. Concern for NSTEMI. Transthoracic Echocardiogram with EF of 25%. Plan for L/R heart cath 2/11. Awaiting outpatient dialysis placement with plan for eventual PD.   Assessment & Plan:   Principal Problem:   Renal failure (ARF), acute on chronic (HCC) Active Problems:   Insulin dependent diabetes mellitus ([place])   Gout   Essential hypertension   Peripheral vascular disease (HCC)   Coronary artery disease involving native coronary artery of native heart without angina pectoris   Elevated troponin   Acute systolic (congestive) heart failure ([place])   Acute kidney injury on CKD 5 Uremia Patient previously considered for peritoneal dialysis. Baseline creatinine of about 4.1-4.8. 10.00 on admission with severely elevated BUN. No mental status changes. Oliguric -Nephrology recommendations: IHD -UOP (strict in/out)  Diabetes mellitus, insulin dependent Last hemoglobin A1C of 7.6%. For his age, his hemoglobin A1C is adequate. No hypoglycemia overnight. Did not receive Lantus yesterday for unknown reason. -Continue SSI and Lantus -Discontinue glipizide on discharge.  Essential hypertension Blood pressure soft but stable -Continue to hold antihypertensives  Peripheral vascular disease CAD No chest pain. Troponin elevated. LDL at goal -Continue aspirin and Lipitor  Gout Stable.  Anemia Normocytic. Secondary to chronic disease from kidney disease. Stable. No evidence of bleeding. Iron and ferritin within normal limits.  Elevated troponin Initial EKG  with ST changes likely secondary to LVH. Troponin significantly elevated in setting of significant kidney injury. No chest pain or equivalent. Repeat EKG with ST depressions in inferior/lateral leads. Concern for NSTEMI. Patient on aspirin and Lipitor. EF of 25% -Cardiology recommendations: Heparin IV, L/R heart cath today -Continue aspirin   DVT prophylaxis: Heparin IV Code Status: Full code Family Communication: None at bedside Disposition Plan: Pending renal management/cardiology workup   Consultants:   Nephrology  Cardiology  Procedures:   Hemodialysis  Echocardiogram (07/16/2017) Study Conclusions  - Left ventricle: Diffuse hypokinesis worse in the mid and basal   inferior wall. The cavity size was moderately dilated. Wall   thickness was normal. The estimated ejection fraction was 25%.   Doppler parameters are consistent with both elevated ventricular   end-diastolic filling pressure and elevated left atrial filling   pressure. - Mitral valve: There was mild regurgitation. - Left atrium: The atrium was moderately dilated. - Right ventricle: The cavity size was mildly dilated. - Right atrium: The atrium was mildly dilated. - Atrial septum: No defect or patent foramen ovale was identified. - Tricuspid valve: There was moderate regurgitation. - Pulmonary arteries: PA peak pressure: 38 mm Hg (S).  Antimicrobials:  None    Subjective: No chest pain, dyspnea or palpitations.  Objective: Vitals:   07/16/17 2206 07/17/17 0652 07/17/17 0855 07/17/17 1026  BP: (!) 96/52 (!) 96/52 (!) 100/54   Pulse: 81 85 82   Resp: 16 17 16    Temp: 98.3 F (36.8 C) 98.5 F (36.9 C) 98.3 F (36.8 C)   TempSrc: Oral Oral Oral   SpO2: 94% 94% 95% 96%  Weight: 66 kg (145 lb 6.4 oz)     Height:        Intake/Output Summary (Last 24  hours) at 07/17/2017 1053 Last data filed at 07/17/2017 0604 Gross per 24 hour  Intake 690.01 ml  Output 2500 ml  Net -1809.99 ml   Filed Weights    07/16/17 1635 07/16/17 2018 07/16/17 2206  Weight: 69.3 kg (152 lb 12.5 oz) 66.4 kg (146 lb 6.2 oz) 66 kg (145 lb 6.4 oz)    Examination:  General exam: Appears calm and comfortable  Respiratory: Clear to auscultation bilaterally. Unlabored work of breathing. No wheezing or rales. Cardiovascular: Regular rate and rhythm. Normal S1 and S2. No heart murmurs present. No extra heart sounds Gastrointestinal: Soft, non-tender, non-distended, no guarding, no rebound, no masses felt Neuro: no focal deficits Psych: normal mood   Data Reviewed: I have personally reviewed following labs and imaging studies  CBC: Recent Labs  Lab 07/12/17 0949  07/14/17 0546 07/15/17 0216 07/15/17 0742 07/16/17 0653 07/17/17 0455  WBC 9.9   < > 6.0 7.1 8.1 7.9 9.1  NEUTROABS 7.6  --   --   --   --   --   --   HGB 10.0*   < > 9.4* 9.1* 8.8* 9.0* 9.6*  HCT 30.1*   < > 28.6* 27.3* 27.2* 28.3* 30.4*  MCV 95.2   < > 94.1 92.9 94.1 96.6 96.8  PLT 224.0   < > 223 236 198 193 198   < > = values in this interval not displayed.   Basic Metabolic Panel: Recent Labs  Lab 07/13/17 0332 07/14/17 0546 07/15/17 0215 07/16/17 0653 07/17/17 0710  NA 134* 136 134* 141 137  K 4.3 4.0 4.1 4.2 4.2  CL 97* 98* 98* 100* 96*  CO2 16* 21* 18* 23 21*  GLUCOSE 96 110* 164* 105* 128*  BUN 172* 107* 117* 63* 29*  CREATININE 10.03* 7.72* 7.96* 6.21* 4.49*  CALCIUM 9.0 8.7* 8.7* 9.1 9.0  PHOS  --  6.3* 6.6* 6.5* 4.9*   GFR: Estimated Creatinine Clearance: 12.4 mL/min (A) (by C-G formula based on SCr of 4.49 mg/dL (H)). Liver Function Tests: Recent Labs  Lab 07/14/17 0546 07/15/17 0215 07/16/17 0653 07/17/17 0710  ALBUMIN 2.7* 2.6* 3.0* 3.2*   No results for input(s): LIPASE, AMYLASE in the last 168 hours. No results for input(s): AMMONIA in the last 168 hours. Coagulation Profile: Recent Labs  Lab 07/17/17 0455  INR 1.13   Cardiac Enzymes: Recent Labs  Lab 07/13/17 0900 07/14/17 1605 07/15/17 0215    TROPONINI 3.70* 4.40* 4.29*   BNP (last 3 results) No results for input(s): PROBNP in the last 8760 hours. HbA1C: No results for input(s): HGBA1C in the last 72 hours. CBG: Recent Labs  Lab 07/15/17 2214 07/16/17 0741 07/16/17 1159 07/16/17 2203 07/17/17 0749  GLUCAP 126* 101* 150* 108* 120*   Lipid Profile: Recent Labs    07/16/17 0653  CHOL 87  HDL 33*  LDLCALC 38  TRIG 78  CHOLHDL 2.6   Thyroid Function Tests: No results for input(s): TSH, T4TOTAL, FREET4, T3FREE, THYROIDAB in the last 72 hours. Anemia Panel: Recent Labs    07/14/17 1107  TIBC 230*  IRON 32*   Sepsis Labs: No results for input(s): PROCALCITON, LATICACIDVEN in the last 168 hours.  No results found for this or any previous visit (from the past 240 hour(s)).       Radiology Studies: No results found.      Scheduled Meds: . [MAR Hold] aspirin  81 mg Oral Daily  . [MAR Hold] atorvastatin  20 mg Oral q1800  . [  MAR Hold] calcitRIOL  0.5 mcg Oral QODAY  . [MAR Hold] calcium acetate  667 mg Oral TID WC  . [MAR Hold] feeding supplement (NEPRO CARB STEADY)  237 mL Oral TID BM  . [MAR Hold] insulin aspart  0-9 Units Subcutaneous TID WC  . [MAR Hold] insulin glargine  5 Units Subcutaneous Daily  . [MAR Hold] metoprolol  75 mg Oral QHS  . [MAR Hold] multivitamin  1 tablet Oral QHS  . [MAR Hold] omega-3 acid ethyl esters  1 g Oral QHS   Continuous Infusions: . sodium chloride 10 mL/hr at 07/17/17 0604  . [MAR Hold] ferric gluconate (FERRLECIT/NULECIT) IV    . heparin Stopped (07/17/17 1000)  . heparin       LOS: 4 days     Cordelia Poche, MD [facility] 07/17/2017, 10:53 AM Pager: [phone]  If 7PM-7AM, please contact night-coverage [email or web address] Password [facility] 07/17/2017, 10:53 AM

## 2017-07-17 NOTE — Progress Notes (Addendum)
Site Area: Right Femoral Artery and Vein Site Prior to Removal: Level 0 Pressure Applied For:  25  Min Manual Pressure Applied: Yed Pt Status During Pull:  Stable Post Pull Site: Level 0 Post Pull Instructions Given:  Yes Post Pull Pulses Present: Doppler Dressing Applied: Yes Bedrest Begins @12 :25   Til 16:25 Comments: MS

## 2017-07-17 NOTE — Progress Notes (Signed)
Patient ID: Douglas Meyer, male   DOB: 07/13/1940, 77 y.o.   MRN: [id number]   [facility] Progress Note   Assessment/ Plan:   1. End-stage renal disease: with progressive chronic kidney disease and admitted for initiation of hemodialysis because of multiple uremic symptoms, underwent hemodialysis early Saturday morning/late Friday night. Process underway for outpatient dialysis unit placement with his ultimate goal being transitioning to peritoneal dialysis. -HD today 2. Anemia: Iron deficiency noted with low iron saturation/permissive ferritin-will give intravenous iron.  3. Coronary artery disease - cath today per cards multivessel CAD including left main disease and awaiting CT surgery evaluation. 4. Secondary hyperparathyroidism: on calcitriol for PTH suppression, and started on calcium acetate for phosphorus binding 5. Nutrition: on Nepro supplementation, begin renal vitamin.  Subjective:   Patient was seen in post cath recovery and is doing well.    Objective:   BP (!) 91/43   Pulse 78   Temp 98.3 F (36.8 C) (Oral)   Resp 13   Ht 5\' 6"  (1.676 m)   Wt 145 lb 6.4 oz (66 kg)   SpO2 91%   BMI 23.47 kg/m   Physical Exam: Gen: NAD Lungs- CTA CVS- no rub Abdomen- +BS, soft, NT/ND Ext no edema, LUE AVF +T/B  Labs: BMET Recent Labs  Lab 07/12/17 0949 07/12/17 1832 07/13/17 0332 07/14/17 0546 07/15/17 0215 07/16/17 0653 07/17/17 0710  NA 135 135 134* 136 134* 141 137  K 4.6 4.8 4.3 4.0 4.1 4.2 4.2  CL 95* 98* 97* 98* 98* 100* 96*  CO2 21 17* 16* 21* 18* 23 21*  GLUCOSE 110* 93 96 110* 164* 105* 128*  BUN >130* 176* 172* 107* 117* 63* 29*  CREATININE 10.00* 9.99* 10.03* 7.72* 7.96* 6.21* 4.49*  CALCIUM 9.1 9.4 9.0 8.7* 8.7* 9.1 9.0  PHOS  --   --   --  6.3* 6.6* 6.5* 4.9*   CBC Recent Labs  Lab 07/12/17 0949  07/15/17 0216 07/15/17 0742 07/16/17 0653 07/17/17 0455  WBC 9.9   < > 7.1 8.1 7.9 9.1  NEUTROABS 7.6  --   --   --   --   --   HGB  10.0*   < > 9.1* 8.8* 9.0* 9.6*  HCT 30.1*   < > 27.3* 27.2* 28.3* 30.4*  MCV 95.2   < > 92.9 94.1 96.6 96.8  PLT 224.0   < > 236 198 193 198   < > = values in this interval not displayed.   Medications:    . [MAR Hold] aspirin  81 mg Oral Daily  . [MAR Hold] atorvastatin  20 mg Oral q1800  . [MAR Hold] calcitRIOL  0.5 mcg Oral QODAY  . [MAR Hold] calcium acetate  667 mg Oral TID WC  . [MAR Hold] feeding supplement (NEPRO CARB STEADY)  237 mL Oral TID BM  . [MAR Hold] insulin aspart  0-9 Units Subcutaneous TID WC  . [MAR Hold] insulin glargine  5 Units Subcutaneous Daily  . [MAR Hold] metoprolol  75 mg Oral QHS  . [MAR Hold] multivitamin  1 tablet Oral QHS  . [MAR Hold] omega-3 acid ethyl esters  1 g Oral QHS   Ralene Ok, MD 07/17/17  I have seen and examined this patient and agree with plan and assessment in the above note with renal recommendations/intervention highlighted.  Doing well post cath and will arrange for HD tomorrow and continue with TTS schedule.  Governor Rooks Boubacar Lerette,MD 07/17/2017 4:53 PM

## 2017-07-17 NOTE — Progress Notes (Signed)
Day of Surgery Procedure(s) (LRB): RIGHT/LEFT HEART CATH AND CORONARY ANGIOGRAPHY (N/A) patient examined, coronary angiogram images and echocardiogram images personally reviewed and discussed with patient Subjective: 77 year old diabetic on dialysis status post previous carotid endarterectomy admitted with positive troponins. Patient denies chest pain or shortness of breath. Echocardiogram shows severe LV dysfunction EF 20-25 percent with moderate MR moderate TR. Coronary angiograms performed today shows severe diffuse CAD with atretic vessels, not adequate targets for grafting. LVEDP 28.  Objective: Vital signs in last 24 hours: Temp:  [97.8 F (36.6 C)-98.7 F (37.1 C)] 98.7 F (37.1 C) (02/11 1650) Pulse Rate:  [25-91] 81 (02/11 1500) Cardiac Rhythm: Normal sinus rhythm (02/11 1352) Resp:  [0-50] 16 (02/11 1600) BP: (85-105)/(38-56) 96/44 (02/11 1600) SpO2:  [82 %-98 %] 95 % (02/11 1500) Weight:  [145 lb 6.4 oz (66 kg)-146 lb 6.2 oz (66.4 kg)] 145 lb 6.4 oz (66 kg) (02/10 2206)  Hemodynamic parameters for last 24 hours:  afebrile regular rhythm  Intake/Output from previous day: 02/10 0701 - 02/11 0700 In: 810 [P.O.:460; I.V.:350] Out: 2500  Intake/Output this shift: No intake/output data recorded.  Gen.-patient feels better after hemodialysis, eating dinner with good appetite Lungs-clear COR- regular rate with positive S4 Abdomen-soft nontender Extremities-mild edema, warm Neuro-no focal weakness  Lab Results: Recent Labs    07/16/17 0653 07/17/17 0455  WBC 7.9 9.1  HGB 9.0* 9.6*  HCT 28.3* 30.4*  PLT 193 198   BMET:  Recent Labs    07/16/17 0653 07/17/17 0710  NA 141 137  K 4.2 4.2  CL 100* 96*  CO2 23 21*  GLUCOSE 105* 128*  BUN 63* 29*  CREATININE 6.21* 4.49*  CALCIUM 9.1 9.0    PT/INR:  Recent Labs    07/17/17 0455  LABPROT 14.4  INR 1.13   ABG    Component Value Date/Time   PHART 7.453 (H) 07/17/2017 1049   HCO3 25.7 07/17/2017 1053   TCO2 27 07/17/2017 1053   O2SAT 63.0 07/17/2017 1053   CBG (last 3)  Recent Labs    07/17/17 0749 07/17/17 1146 07/17/17 1650  GLUCAP 120* 127* 146*    Assessment/Plan: S/P Procedure(s) (LRB): RIGHT/LEFT HEART CATH AND CORONARY ANGIOGRAPHY (N/A) Patient with severe ischemic cardiomyopathy diffuse diabetic pattern of disease with  inadequate targets for grafting.  I would recommend treating the patient medically or consider PCI of the proximal LAD-left main . Patient would not be candidate for CABG under any circumstance.   LOS: 4 days    Tharon Aquas Trigt III 07/17/2017

## 2017-07-17 NOTE — Progress Notes (Signed)
ANTICOAGULATION CONSULT NOTE - Follow-Up Consult  Pharmacy Consult for heparin Indication: chest pain/ACS  No Known Allergies  Patient Measurements: Height: 5\' 6"  (167.6 cm) Weight: 145 lb 6.4 oz (66 kg) IBW/kg (Calculated) : 63.8 Heparin Dosing Weight: 68 kg  Vital Signs: Temp: 98.3 F (36.8 C) (02/11 0855) Temp Source: Oral (02/11 0855) BP: 100/54 (02/11 0855) Pulse Rate: 82 (02/11 0855)  Labs: Recent Labs    07/14/17 1605 07/15/17 0215  07/15/17 4098 07/16/17 1191 07/16/17 0955 07/16/17 2132 07/17/17 0455 07/17/17 0710  HGB  --   --    < > 8.8* 9.0*  --   --  9.6*  --   HCT  --   --    < > 27.2* 28.3*  --   --  30.4*  --   PLT  --   --    < > 198 193  --   --  198  --   LABPROT  --   --   --   --   --   --   --  14.4  --   INR  --   --   --   --   --   --   --  1.13  --   HEPARINUNFRC  --   --    < > 0.42 >2.20* 0.13* 0.24*  --  0.31  CREATININE  --  7.96*  --   --  6.21*  --   --   --  4.49*  TROPONINI 4.40* 4.29*  --   --   --   --   --   --   --    < > = values in this interval not displayed.   Assessment: Douglas Meyer is a 77 y/o M who presented on 2/7 with worsening renal function and elevated troponins on 2/8. ECHO on 2/10 shows EF of 25%, cards is planning a cardiac cath on 2/11.   Heparin level is now therapeutic on 1100 units/hr.  CBC stable, no bleeding noted. Will increase current drip rate and recheck levels.  Goal of Therapy:  Heparin level 0.3-0.7 units/ml Monitor platelets by anticoagulation protocol: Yes   Plan:  Continue heparin gtt at 1100 units/hr Monitor daily heparin level, CBC, s/sx bleeding Follow-up after cardiac cath 2/11   [facility], Pharm.D., BCPS Clinical Pharmacist Pager: [phone] Clinical phone for 07/17/2017 from 8:30-4:00 is [phone]. After 4pm, please call Main Rx [phone]) for assistance. 07/17/2017 10:22 AM

## 2017-07-17 NOTE — Progress Notes (Signed)
Accepted at [facility] .1st treatment is : Thursday February 14,2019 at 11:15am .Schedule and chairtime is :Tuesday,Thursday,Saturday at 11:45am .2nd shift

## 2017-07-17 NOTE — Interval H&P Note (Signed)
Cath Lab Visit (complete for each Cath Lab visit)  Clinical Evaluation Leading to the Procedure:   ACS: Yes.    Non-ACS:    Anginal Classification: CCS IV  Anti-ischemic medical therapy: Minimal Therapy (1 class of medications)  Non-Invasive Test Results: No non-invasive testing performed  Prior CABG: No previous CABG    Low EF by echo  History and Physical Interval Note:  07/17/2017 10:32 AM  Douglas Meyer  has presented today for surgery, with the diagnosis of unstable angina  The various methods of treatment have been discussed with the patient and family. After consideration of risks, benefits and other options for treatment, the patient has consented to  Procedure(s): RIGHT/LEFT HEART CATH AND CORONARY ANGIOGRAPHY (N/A) as a surgical intervention .  The patient's history has been reviewed, patient examined, no change in status, stable for surgery.  I have reviewed the patient's chart and labs.  Questions were answered to the patient's satisfaction.     Larae Grooms

## 2017-07-18 ENCOUNTER — Encounter (HOSPITAL_COMMUNITY): Payer: Medicare Other

## 2017-07-18 DIAGNOSIS — I248 Other forms of acute ischemic heart disease: Secondary | ICD-10-CM

## 2017-07-18 DIAGNOSIS — I739 Peripheral vascular disease, unspecified: Secondary | ICD-10-CM

## 2017-07-18 LAB — RENAL FUNCTION PANEL
ANION GAP: 19 — AB (ref 5–15)
Albumin: 3.1 g/dL — ABNORMAL LOW (ref 3.5–5.0)
BUN: 48 mg/dL — ABNORMAL HIGH (ref 6–20)
CALCIUM: 9.2 mg/dL (ref 8.9–10.3)
CO2: 22 mmol/L (ref 22–32)
Chloride: 95 mmol/L — ABNORMAL LOW (ref 101–111)
Creatinine, Ser: 6.13 mg/dL — ABNORMAL HIGH (ref 0.61–1.24)
GFR calc non Af Amer: 8 mL/min — ABNORMAL LOW (ref >60)
GFR, EST AFRICAN AMERICAN: 9 mL/min — AB (ref >60)
Glucose, Bld: 165 mg/dL — ABNORMAL HIGH (ref 65–99)
Phosphorus: 7.7 mg/dL — ABNORMAL HIGH (ref 2.5–4.6)
Potassium: 4.6 mmol/L (ref 3.5–5.1)
SODIUM: 136 mmol/L (ref 135–145)

## 2017-07-18 LAB — GLUCOSE, CAPILLARY
GLUCOSE-CAPILLARY: 167 mg/dL — AB (ref 65–99)
GLUCOSE-CAPILLARY: 135 mg/dL — AB (ref 65–99)
Glucose-Capillary: 168 mg/dL — ABNORMAL HIGH (ref 65–99)
Glucose-Capillary: 111 mg/dL — ABNORMAL HIGH (ref 65–99)

## 2017-07-18 LAB — CBC
HCT: 29.8 % — ABNORMAL LOW (ref 39.0–52.0)
HEMOGLOBIN: 9.5 g/dL — AB (ref 13.0–17.0)
MCH: 30.8 pg (ref 26.0–34.0)
MCHC: 31.9 g/dL (ref 30.0–36.0)
MCV: 96.8 fL (ref 78.0–100.0)
Platelets: 184 10*3/uL (ref 150–400)
RBC: 3.08 MIL/uL — AB (ref 4.22–5.81)
RDW: 16.4 % — ABNORMAL HIGH (ref 11.5–15.5)
WBC: 8.7 10*3/uL (ref 4.0–10.5)

## 2017-07-18 LAB — HEPARIN LEVEL (UNFRACTIONATED): Heparin Unfractionated: 0.29 IU/mL — ABNORMAL LOW (ref 0.30–0.70)

## 2017-07-18 MED ORDER — CLOPIDOGREL BISULFATE 75 MG PO TABS
75.0000 mg | ORAL_TABLET | Freq: Every day | ORAL | Status: DC
  Administered 2017-07-18 – 2017-07-19 (×2): 75 mg via ORAL
  Filled 2017-07-18 (×2): qty 1

## 2017-07-18 MED FILL — Heparin Sodium (Porcine) 2 Unit/ML in Sodium Chloride 0.9%: INTRAMUSCULAR | Qty: 1000 | Status: AC

## 2017-07-18 MED FILL — Lidocaine HCl Local Inj 1%: INTRAMUSCULAR | Qty: 20 | Status: AC

## 2017-07-18 NOTE — Progress Notes (Signed)
Patient had a short run of 2:1 heart block, possibly sleeping at that time. Systolic blood pressures have been in the 80's most of the day and yesterday. Notified Ignacia Bayley NP of above and that Toprol 75 mg ordered for tonight. Will hold Toprol tonight as instructed and have am team rounding reevaluate. [facility] RN aware.

## 2017-07-18 NOTE — Progress Notes (Addendum)
OT Cancellation Note  Patient Details Name: Douglas Meyer MRN: [id number] DOB: Aug 23, 1940   Cancelled Treatment:    Reason Eval/Treat Not Completed: Patient at procedure or test/ unavailable. Pt off unit for HD. Will check back as able.   Addendum 13:23: Discussed with PT. Per MD and RN, to hold therapies until cardiac issues addressed. Will follow up as medically appropriate.  Norman Herrlich, MS OTR/L  Pager: [phone]   Norman Herrlich 07/18/2017, 7:43 AM

## 2017-07-18 NOTE — Progress Notes (Signed)
PT Cancellation Note  Patient Details Name: Douglas Meyer MRN: [id number] DOB: Jun 23, 1940   Cancelled Treatment:    Reason Eval/Treat Not Completed: Medical issues which prohibited therapy Spoke with RN and MD requesting to hold until cardiac issues addressed. Will follow up as schedule allows and as pt medically appropriate.   Leighton Ruff, PT, DPT  Acute Rehabilitation Services  Pager: [phone]  [name] 07/18/2017, 12:41 PM

## 2017-07-18 NOTE — Progress Notes (Signed)
PROGRESS NOTE    Douglas Meyer  IPJ:[id number] DOB: 1940/08/22 DOA: 07/13/2017 PCP: Dorena Cookey, MD   Brief Narrative: Douglas Meyer is a 77 y.o.  male with history of chronic kidney disease stage V, CAD, gout, diabetes mellitus, hyperlipidemia, anemia. He presented secondary to elevated creatinine and BUN seen at PCP's office concerning for acute kidney injury. No mental status changes. Nephrology consulted and hemodialysis started. Concern for NSTEMI. Transthoracic Echocardiogram with EF of 25% and diffuse hypokinesis Heart cath significant for 3 vessel disease but patient is not a candidate for surgical management per CT surgery. Awaiting outpatient dialysis placement with plan for eventual PD.   Assessment & Plan:   Principal Problem:   Renal failure (ARF), acute on chronic (HCC) Active Problems:   Insulin dependent diabetes mellitus (HCC)   Gout   Essential hypertension   Non-ST elevation (NSTEMI) myocardial infarction [facility])   Peripheral vascular disease (HCC)   Coronary artery disease involving native coronary artery of native heart without angina pectoris   Elevated troponin   Acute systolic (congestive) heart failure ([place])   Acute kidney injury on CKD 5 Uremia Patient previously considered for peritoneal dialysis. Baseline creatinine of about 4.1-4.8. 10.00 on admission with severely elevated BUN. No mental status changes. Oliguric -Nephrology recommendations: IHD -UOP (strict in/out)  Diabetes mellitus, insulin dependent Last hemoglobin A1C of 7.6%. For his age, his hemoglobin A1C is adequate. No hypoglycemia overnight. Did not receive Lantus yesterday for unknown reason. -Continue SSI and Lantus -Discontinue glipizide on discharge.  Essential hypertension Blood pressure soft but stable -Continue to hold antihypertensives  Peripheral vascular disease CAD No chest pain. Troponin elevated. LDL at goal -Continue aspirin and  Lipitor  Gout Stable.  Anemia Normocytic. Secondary to chronic disease from kidney disease. Stable. No evidence of bleeding. Iron and ferritin within normal limits.  NSTEMI EKG ST depressions with elevated troponin. Cath significant for 3 vessel disease. CT surgery consulted and patient not a candidate for surgery. Echocardiogram performed and significant for an EF of 25% with diffuse hypokinesis. -Cardiology recommendations: Heparin, plan for possible PCI -Continue aspirin and Lipitor   DVT prophylaxis: Heparin IV Code Status: Full code Family Communication: None at bedside Disposition Plan: Pending renal management/cardiology workup   Consultants:   Nephrology  Cardiology  Procedures:   Hemodialysis  Echocardiogram (07/16/2017) Study Conclusions  - Left ventricle: Diffuse hypokinesis worse in the mid and basal   inferior wall. The cavity size was moderately dilated. Wall   thickness was normal. The estimated ejection fraction was 25%.   Doppler parameters are consistent with both elevated ventricular   end-diastolic filling pressure and elevated left atrial filling   pressure. - Mitral valve: There was mild regurgitation. - Left atrium: The atrium was moderately dilated. - Right ventricle: The cavity size was mildly dilated. - Right atrium: The atrium was mildly dilated. - Atrial septum: No defect or patent foramen ovale was identified. - Tricuspid valve: There was moderate regurgitation. - Pulmonary arteries: PA peak pressure: 38 mm Hg (S).  L/R Heart cath (07/17/2017) Conclusion     Prox RCA to Mid RCA lesion is 100% stenosed.  Ost LM to Dist LM lesion is 70% stenosed.  Prox Cx lesion is 25% stenosed.  Mid LAD lesion is 75% stenosed.  1st Diag lesion is 75% stenosed.  LV end diastolic pressure is moderately elevated, 26 mm Hg.  There is no aortic valve stenosis.  Hemodynamic findings consistent with mild pulmonary hypertension.  CO 5.7 L/min; CI  3.2;  Ao sat 94%; PA sat 63%; mean PCWP 22 mm Hg   Severe three vessel CAD involving left main, LAD and RCA.   Plan for cardiac surgery consultation.  Restart heparin 8 hours post sheath pull.     Antimicrobials:  None    Subjective: No chest pain or dyspnea. No concerns overnight.  Objective: Vitals:   07/17/17 2000 07/17/17 2200 07/18/17 0200 07/18/17 0410  BP: (!) 107/59 (!) 108/54 (!) 93/48 (!) 90/43  Pulse: 82 80 66 69  Resp: 18 12 20 18   Temp: 98.2 F (36.8 C)  97.8 F (36.6 C)   TempSrc: Oral  Oral   SpO2: 94% 92% 94% 97%  Weight:   65.5 kg (144 lb 6.4 oz)   Height:        Intake/Output Summary (Last 24 hours) at 07/18/2017 0718 Last data filed at 07/18/2017 0700 Gross per 24 hour  Intake 355.5 ml  Output -  Net 355.5 ml   Filed Weights   07/16/17 2018 07/16/17 2206 07/18/17 0200  Weight: 66.4 kg (146 lb 6.2 oz) 66 kg (145 lb 6.4 oz) 65.5 kg (144 lb 6.4 oz)    Examination:  General exam: Appears calm and comfortable Respiratory: Clear to auscultation bilaterally. Unlabored work of breathing. No wheezing or rales. Cardiovascular: Regular rate and rhythm. Normal S1 and S2. No heart murmurs present. No extra heart sounds Gastrointestinal: Soft, non-tender, non-distended, no guarding, no rebound, no masses felt Neuro: no focal deficits Psych: normal mood   Data Reviewed: I have personally reviewed following labs and imaging studies  CBC: Recent Labs  Lab 07/12/17 0949  07/15/17 0216 07/15/17 0742 07/16/17 0653 07/17/17 0455 07/18/17 0523  WBC 9.9   < > 7.1 8.1 7.9 9.1 8.7  NEUTROABS 7.6  --   --   --   --   --   --   HGB 10.0*   < > 9.1* 8.8* 9.0* 9.6* 9.5*  HCT 30.1*   < > 27.3* 27.2* 28.3* 30.4* 29.8*  MCV 95.2   < > 92.9 94.1 96.6 96.8 96.8  PLT 224.0   < > 236 198 193 198 184   < > = values in this interval not displayed.   Basic Metabolic Panel: Recent Labs  Lab 07/14/17 0546 07/15/17 0215 07/16/17 0653 07/17/17 0710 07/18/17 0523   NA 136 134* 141 137 136  K 4.0 4.1 4.2 4.2 4.6  CL 98* 98* 100* 96* 95*  CO2 21* 18* 23 21* 22  GLUCOSE 110* 164* 105* 128* 165*  BUN 107* 117* 63* 29* 48*  CREATININE 7.72* 7.96* 6.21* 4.49* 6.13*  CALCIUM 8.7* 8.7* 9.1 9.0 9.2  PHOS 6.3* 6.6* 6.5* 4.9* 7.7*   GFR: Estimated Creatinine Clearance: 9.1 mL/min (A) (by C-G formula based on SCr of 6.13 mg/dL (H)). Liver Function Tests: Recent Labs  Lab 07/14/17 0546 07/15/17 0215 07/16/17 0653 07/17/17 0710 07/18/17 0523  ALBUMIN 2.7* 2.6* 3.0* 3.2* 3.1*   No results for input(s): LIPASE, AMYLASE in the last 168 hours. No results for input(s): AMMONIA in the last 168 hours. Coagulation Profile: Recent Labs  Lab 07/17/17 0455  INR 1.13   Cardiac Enzymes: Recent Labs  Lab 07/13/17 0900 07/14/17 1605 07/15/17 0215  TROPONINI 3.70* 4.40* 4.29*   BNP (last 3 results) No results for input(s): PROBNP in the last 8760 hours. HbA1C: No results for input(s): HGBA1C in the last 72 hours. CBG: Recent Labs  Lab 07/17/17 0749 07/17/17 1146 07/17/17 1650 07/17/17 2120 07/18/17  [place]   Lipid Profile: Recent Labs    07/16/17 0653  CHOL 87  HDL 33*  LDLCALC 38  TRIG 78  CHOLHDL 2.6   Thyroid Function Tests: No results for input(s): TSH, T4TOTAL, FREET4, T3FREE, THYROIDAB in the last 72 hours. Anemia Panel: No results for input(s): VITAMINB12, FOLATE, FERRITIN, TIBC, IRON, RETICCTPCT in the last 72 hours. Sepsis Labs: No results for input(s): PROCALCITON, LATICACIDVEN in the last 168 hours.  No results found for this or any previous visit (from the past 240 hour(s)).       Radiology Studies: No results found.      Scheduled Meds: . aspirin  81 mg Oral Daily  . atorvastatin  20 mg Oral q1800  . calcitRIOL  0.5 mcg Oral QODAY  . calcium acetate  667 mg Oral TID WC  . feeding supplement (NEPRO CARB STEADY)  237 mL Oral TID BM  . insulin aspart  0-9 Units Subcutaneous TID  WC  . insulin glargine  5 Units Subcutaneous Daily  . metoprolol  75 mg Oral QHS  . multivitamin  1 tablet Oral QHS  . omega-3 acid ethyl esters  1 g Oral QHS  . sodium chloride flush  3 mL Intravenous Q12H   Continuous Infusions: . sodium chloride    . ferric gluconate (FERRLECIT/NULECIT) IV    . heparin 1,100 Units/hr (07/18/17 0700)     LOS: 5 days     Cordelia Poche, MD [facility] 07/18/2017, 7:18 AM Pager: [phone]  If 7PM-7AM, please contact night-coverage [email or web address] Password [facility] 07/18/2017, 7:18 AM

## 2017-07-18 NOTE — Progress Notes (Signed)
Progress Note  Patient Name: Douglas Meyer Date of Encounter: 07/18/2017  Primary Cardiologist: Minus Breeding, MD   Subjective   Feeling well.  No chest pain or shortness of breath.  No lower extremity edema, orthopnea or PND.   Inpatient Medications    Scheduled Meds: . aspirin  81 mg Oral Daily  . atorvastatin  20 mg Oral q1800  . calcitRIOL  0.5 mcg Oral QODAY  . calcium acetate  667 mg Oral TID WC  . feeding supplement (NEPRO CARB STEADY)  237 mL Oral TID BM  . insulin aspart  0-9 Units Subcutaneous TID WC  . insulin glargine  5 Units Subcutaneous Daily  . metoprolol  75 mg Oral QHS  . multivitamin  1 tablet Oral QHS  . omega-3 acid ethyl esters  1 g Oral QHS  . sodium chloride flush  3 mL Intravenous Q12H   Continuous Infusions: . sodium chloride    . ferric gluconate (FERRLECIT/NULECIT) IV    . heparin 1,100 Units/hr (07/18/17 0700)   PRN Meds: sodium chloride, acetaminophen, ondansetron (ZOFRAN) IV, ondansetron **OR** [DISCONTINUED] ondansetron (ZOFRAN) IV, sodium chloride flush   Vital Signs    Vitals:   07/18/17 0708 07/18/17 0730 07/18/17 0800 07/18/17 0830  BP: (!) 98/51 (!) 92/47 (!) 95/50 (!) 100/50  Pulse: (!) 59 67 65 68  Resp:      Temp:      TempSrc:      SpO2:      Weight:      Height:        Intake/Output Summary (Last 24 hours) at 07/18/2017 0855 Last data filed at 07/18/2017 0700 Gross per 24 hour  Intake 355.5 ml  Output -  Net 355.5 ml   Filed Weights   07/16/17 2206 07/18/17 0200 07/18/17 0656  Weight: 145 lb 6.4 oz (66 kg) 144 lb 6.4 oz (65.5 kg) 149 lb 4 oz (67.7 kg)    Telemetry    Sinus rhythm.  - Personally Reviewed  ECG    n/a - Personally Reviewed  Physical Exam   VS:  BP (!) (P) 98/48   Pulse (P) 69   Temp (!) 97.5 F (36.4 C) (Oral)   Resp 19   Ht 5\' 6"  (1.676 m)   Wt 149 lb 4 oz (67.7 kg)   SpO2 100%   BMI 24.09 kg/m  , BMI Body mass index is 24.09 kg/m. GENERAL:  Well appearing HEENT: Pupils equal  round and reactive, fundi not visualized, oral mucosa unremarkable NECK:  No jugular venous distention, waveform within normal limits, carotid upstroke brisk and symmetric, no bruits LUNGS:  Clear to auscultation bilaterally HEART:  RRR.  PMI not displaced or sustained,S1 and S2 within normal limits, no S3, no S4, no clicks, no rubs, no murmurs ABD:  Flat, positive bowel sounds normal in frequency in pitch, no bruits, no rebound, no guarding, no midline pulsatile mass, no hepatomegaly, no splenomegaly EXT:  2 plus pulses throughout, no edema, no cyanosis no clubbing.  L UE fistula SKIN:  No rashes no nodules NEURO:  Cranial nerves II through XII grossly intact, motor grossly intact throughout [facility]:  Cognitively intact, oriented to person place and time   Labs    Chemistry Recent Labs  Lab 07/16/17 0653 07/17/17 0710 07/18/17 0523  NA 141 137 136  K 4.2 4.2 4.6  CL 100* 96* 95*  CO2 23 21* 22  GLUCOSE 105* 128* 165*  BUN 63* 29* 48*  CREATININE 6.21* 4.49*  6.13*  CALCIUM 9.1 9.0 9.2  ALBUMIN 3.0* 3.2* 3.1*  GFRNONAA 8* 11* 8*  GFRAA 9* 13* 9*  ANIONGAP 18* 20* 19*     Hematology Recent Labs  Lab 07/16/17 0653 07/17/17 0455 07/18/17 0523  WBC 7.9 9.1 8.7  RBC 2.93* 3.14* 3.08*  HGB 9.0* 9.6* 9.5*  HCT 28.3* 30.4* 29.8*  MCV 96.6 96.8 96.8  MCH 30.7 30.6 30.8  MCHC 31.8 31.6 31.9  RDW 16.0* 16.2* 16.4*  PLT 193 198 184    Cardiac Enzymes Recent Labs  Lab 07/13/17 0900 07/14/17 1605 07/15/17 0215  TROPONINI 3.70* 4.40* 4.29*   No results for input(s): TROPIPOC in the last 168 hours.   BNPNo results for input(s): BNP, PROBNP in the last 168 hours.   DDimer No results for input(s): DDIMER in the last 168 hours.   Radiology    No results found.  Cardiac Studies   Echo 07/16/17: Study Conclusions  - Left ventricle: Diffuse hypokinesis worse in the mid and basal   inferior wall. The cavity size was moderately dilated. Wall   thickness was normal. The  estimated ejection fraction was 25%.   Doppler parameters are consistent with both elevated ventricular   end-diastolic filling pressure and elevated left atrial filling   pressure. - Mitral valve: There was mild regurgitation. - Left atrium: The atrium was moderately dilated. - Right ventricle: The cavity size was mildly dilated. - Right atrium: The atrium was mildly dilated. - Atrial septum: No defect or patent foramen ovale was identified. - Tricuspid valve: There was moderate regurgitation. - Pulmonary arteries: PA peak pressure: 38 mm Hg (S).  LHC/RHC 07/17/17:  Prox RCA to Mid RCA lesion is 100% stenosed.  Ost LM to Dist LM lesion is 70% stenosed.  Prox Cx lesion is 25% stenosed.  Mid LAD lesion is 75% stenosed.  1st Diag lesion is 75% stenosed.  LV end diastolic pressure is moderately elevated, 26 mm Hg.  There is no aortic valve stenosis.  Hemodynamic findings consistent with mild pulmonary hypertension.  CO 5.7 L/min; CI 3.2; Ao sat 94%; PA sat 63%; mean PCWP 22 mm Hg   Severe three vessel CAD involving left main, LAD and RCA.   Patient Profile     [name] is a 64 with ESRD newly started on HD, diabetes, and PAD here with acute on chronic renal failure and NSTEMI.  Echo this admission revealed newly reduced systolic function.    Assessment & Plan    # 3 vessel CAD: # NSTEMI:  [name] remains chest pain free.  He was deemed high risk for CABG and does not have suitable targets for grafting.  The case was discussed with Dr. Burt Knack who will evaluate him for possible LM PCI.  This would be very high risk, especially given his reduced systolic function and occluded RCA.  He has metal in his L arm but has previously undergone MRI.  Presumably this is MRI compatible.  Query whether MRI for viability would be helpful.  Continue aspirin, heparin, metoprolol, and atorvastatin. LDL 38 this admission.   # Acute systolic and diastolic heart failure:  LVEF 20%.  He appears  euvolemic.  LVDP was 26 mmHg on cath 07/17/17.  Volume management with HD.    # ESRD: Tolerating HD well.  Newly started this admission.   For questions or updates, please contact [place] Please consult [email or web address] for contact info under Cardiology/STEMI.      Signed, Skeet Latch, MD  07/18/2017,  8:55 AM

## 2017-07-18 NOTE — Progress Notes (Signed)
Patient ID: Douglas Meyer, male   DOB: [birth year], 77 y.o.   MRN: [id number]   [facility] Progress Note   Assessment/ Plan:   1. End-stage renal disease: with progressive chronic kidney disease and admitted for initiation of hemodialysis because of multiple uremic symptoms, underwent hemodialysis early Saturday morning/late Friday night. Process underway for outpatient dialysis unit placement with his ultimate goal being transitioning to peritoneal dialysis. -HD today, plan for TTS, dispo pending cards  2. Anemia: Iron deficiency noted with low iron saturation/permissive ferritin. S/p IV iron.  3. Coronary artery disease - cath with diffuse disease, seen by CVTS, not a candidate for CABG. Medical management vs stent.  -await cards plan.  4. Secondary hyperparathyroidism: on calcitriol for PTH suppression, and started on calcium acetate for phosphorus binding 5. Nutrition: on Nepro supplementation, begin renal vitamin.  Subjective:   Patient seen in HD, in good spirits. Surprised by cath findings, but understands the plan.    Objective:   BP (!) 100/50   Pulse 68   Temp (!) 97.5 F (36.4 C) (Oral)   Resp 19   Ht 5\' 6"  (1.676 m)   Wt 149 lb 4 oz (67.7 kg)   SpO2 100%   BMI 24.09 kg/m   Physical Exam: Gen: elderly male resting in bed in NAD CV: RRR, no murmur Resp: CTAB, easy WOB Abd: SNTND, +BS Ext: no edema  Labs: BMET Recent Labs  Lab 07/12/17 1832 07/13/17 0332 07/14/17 0546 07/15/17 0215 07/16/17 0653 07/17/17 0710 07/18/17 0523  NA 135 134* 136 134* 141 137 136  K 4.8 4.3 4.0 4.1 4.2 4.2 4.6  CL 98* 97* 98* 98* 100* 96* 95*  CO2 17* 16* 21* 18* 23 21* 22  GLUCOSE 93 96 110* 164* 105* 128* 165*  BUN 176* 172* 107* 117* 63* 29* 48*  CREATININE 9.99* 10.03* 7.72* 7.96* 6.21* 4.49* 6.13*  CALCIUM 9.4 9.0 8.7* 8.7* 9.1 9.0 9.2  PHOS  --   --  6.3* 6.6* 6.5* 4.9* 7.7*   CBC Recent Labs  Lab 07/12/17 0949  07/15/17 0742 07/16/17 0653  07/17/17 0455 07/18/17 0523  WBC 9.9   < > 8.1 7.9 9.1 8.7  NEUTROABS 7.6  --   --   --   --   --   HGB 10.0*   < > 8.8* 9.0* 9.6* 9.5*  HCT 30.1*   < > 27.2* 28.3* 30.4* 29.8*  MCV 95.2   < > 94.1 96.6 96.8 96.8  PLT 224.0   < > 198 193 198 184   < > = values in this interval not displayed.   Medications:    . aspirin  81 mg Oral Daily  . atorvastatin  20 mg Oral q1800  . calcitRIOL  0.5 mcg Oral QODAY  . calcium acetate  667 mg Oral TID WC  . feeding supplement (NEPRO CARB STEADY)  237 mL Oral TID BM  . insulin aspart  0-9 Units Subcutaneous TID WC  . insulin glargine  5 Units Subcutaneous Daily  . metoprolol  75 mg Oral QHS  . multivitamin  1 tablet Oral QHS  . omega-3 acid ethyl esters  1 g Oral QHS  . sodium chloride flush  3 mL Intravenous Q12H   Ralene Ok, MD 07/18/17  I have seen and examined this patient and agree with plan and assessment in the above note with renal recommendations/intervention highlighted.  Doing well despite severe multi-vessel CAD with LM disease.  Cardiology to evaluate for possible  PCI of LM.  Not a candidate for CABG per CT surgery. Governor Rooks Dragon Thrush,MD 07/18/2017 12:54 PM

## 2017-07-18 NOTE — Progress Notes (Signed)
ANTICOAGULATION CONSULT NOTE - Follow-Up Consult  Pharmacy Consult for heparin Indication: chest pain/ACS  No Known Allergies  Patient Measurements: Height: 5\' 6"  (167.6 cm) Weight: 147 lb 0.8 oz (66.7 kg) IBW/kg (Calculated) : 63.8 Heparin Dosing Weight: 68 kg  Vital Signs: Temp: 98.1 F (36.7 C) (02/12 1236) Temp Source: Oral (02/12 1236) BP: 92/39 (02/12 1236) Pulse Rate: 65 (02/12 1236)  Labs: Recent Labs    07/16/17 0653  07/16/17 2132 07/17/17 0455 07/17/17 0710 07/18/17 0523  HGB 9.0*  --   --  9.6*  --  9.5*  HCT 28.3*  --   --  30.4*  --  29.8*  PLT 193  --   --  198  --  184  LABPROT  --   --   --  14.4  --   --   INR  --   --   --  1.13  --   --   HEPARINUNFRC >2.20*   < > 0.24*  --  0.31 0.29*  CREATININE 6.21*  --   --   --  4.49* 6.13*   < > = values in this interval not displayed.   Assessment: Douglas Meyer is a 77 y/o M who presented on 2/7 with worsening renal function and elevated troponins on 2/8. ECHO on 2/10 shows EF of 25%, cards is planning a cardiac cath on 2/11.   Heparin level just below goal on 1100 units/hr.  CBC stable, no bleeding noted. Will increase current drip rate and recheck levels in am.  Goal of Therapy:  Heparin level 0.3-0.7 units/ml Monitor platelets by anticoagulation protocol: Yes   Plan:  Increase heparin gtt to 1200 units/hr Monitor daily heparin level, CBC, s/sx bleeding  Erin Hearing PharmD., BCPS Clinical Pharmacist 07/18/2017 1:25 PM

## 2017-07-18 NOTE — Progress Notes (Signed)
PT Cancellation Note  Patient Details Name: Douglas Meyer MRN: [id number] DOB: 02/29/1941   Cancelled Treatment:    Reason Eval/Treat Not Completed: Patient at procedure or test/unavailable Pt currently at HD. Will check back as schedule allows.   Leighton Ruff, PT, DPT  Acute Rehabilitation Services  Pager: [phone]  [name] 07/18/2017, 10:29 AM

## 2017-07-18 NOTE — Care Management Note (Signed)
Case Management Note  Patient Details  Name: Zadkiel Dragan MRN: [id number] Date of Birth: 08-22-1940  Subjective/Objective:  From home with wife, per previous NCM note , for rolling walker, patient chose [facility].  [facility] agency list was left with patient to choose an agency for HHPT/HHOT, but he has not chose one yet.  Patient is s/p heart cath 2/11-  3 vessel dz-  He was deemed high risk for CABG and does not have suitable targets for grafting.  The case was discussed with Dr. Burt Knack who will evaluate him for possible LM PCI  Per MD note.                     Action/Plan: NCM will follow for dc needs.  Expected Discharge Date:                  Expected Discharge Plan:  [place]  In-House Referral:     Discharge planning Services  CM Consult  Post Acute Care Choice:  Durable Medical Equipment Choice offered to:  Patient  DME Arranged:  Walker rolling DME Agency:  [place]:    [facility] Agency:     Status of Service:  In process, will continue to follow  If discussed at Long Length of Stay Meetings, dates discussed:    Additional Comments:  Zenon Mayo, RN 07/18/2017, 2:22 PM

## 2017-07-18 NOTE — Consult Note (Signed)
Cardiology Consultation:   Patient ID: Douglas Meyer; [id number]; 08-03-1940   Admit date: 07/13/2017 Date of Consult: 07/18/2017  Primary Care Provider: Dorena Cookey, MD Primary Cardiologist: Minus Breeding, MD   Patient Profile:   Douglas Meyer is a 77 y.o. male with a hx of CKD 4 now ESRD who is being seen today for the evaluation of severe multivessel CAD at the request of Dr Oval Linsey.  History of Present Illness:   Douglas Meyer has long-standing chronic kidney disease and diabetes.  He has known coronary artery disease history of remote MI.  He had been told that he has a chronically occluded coronary vessel with collaterals but specific coronary anatomy has been unknown.  The patient developed symptoms of uremia on a background of known advanced kidney disease.  He has a functioning left arm fistula.  He was hospitalized with a creatinine greater than 10 and BUN greater than 130.  He was noted to have an elevated troponin with a flat trend around 4 mg/dL.  An echocardiogram showed severe LV systolic dysfunction with LVEF approximately 20%.  He was referred for cardiac catheterization which demonstrated severe multivessel coronary artery disease with chronic total occlusion of the RCA, severe diffuse calcific left main stenosis of 80%, tightest in the distal left main area, and moderately severe mid LAD stenosis.  Cardiac surgery was consulted and the patient was turned down for CABG because of severe comorbidities including end-stage renal disease, severe LV dysfunction, and poor coronary targets.  The patient is interviewed tonight with his wife and daughter at the bedside.  He denies any episodes of chest pain and also denies shortness of breath.  He is complained of progressive fatigue and poor appetite.  He denies orthopnea or PND.  He has tolerated 4 sessions of hemodialysis since hospital admission without hemodynamic compromise or anginal symptoms.  Past Medical History:  Diagnosis  Date  . Bilateral carotid artery disease ([place])   . CAD (coronary artery disease) 05/15/10   out of hospital myocardial infarction in the 1990's. this was picked up on an  EKG in 1997.  Cardiac catherterization demonstrated an occluded vessel and collateras.  I have no description of this.  A stress  perfusion study  done 2008 demonstrated an ejection fraction  of  53% with inferolateral  hypokinesis.  There was an inferolateral defect with  scar and some mild mixed ischemia.   managed rx  . Cancer ([place])    skin  . CKD (chronic kidney disease), stage IV ([place])   . Diabetes mellitus    DM, type II  . ED (erectile dysfunction)   . Gout   . History of kidney stones   . Hx of cardiovascular stress test    a. Lexiscan myoview 10/18/12: EF 53%, inf and inf-lat scar, slight peri-infarct ischemia, small area of apical ischemia; no change from 2006  . Hyperlipidemia   . Hypertension   . Myocardial infarction ([place])   . Nephrolithiasis   . Polyp of colon   . PVD (peripheral vascular disease) ([place])     Past Surgical History:  Procedure Laterality Date  . AV FISTULA PLACEMENT Right 06/19/2015   Procedure: ARTERIOVENOUS (AV) FISTULA CREATION;  Surgeon: Serafina Mitchell, MD;  Location: [facility];  Service: Vascular;  Laterality: Right;  . BASCILIC VEIN TRANSPOSITION Left 08/11/2016   Procedure: BASCILIC VEIN TRANSPOSITION-LEFT 1ST STAGE;  Surgeon: Serafina Mitchell, MD;  Location: [facility];  Service: Vascular;  Laterality: Left;  . BASCILIC VEIN TRANSPOSITION  Left 10/21/2016   Procedure: LEFT ARM 2ND STAGE BASILIC VEIN TRANSPOSITION;  Surgeon: Serafina Mitchell, MD;  Location: [facility];  Service: Vascular;  Laterality: Left;  . COLONOSCOPY W/ POLYPECTOMY    . CYSTOSCOPY     stone removal x 1  . ENDARTERECTOMY Right 11/28/2014   Procedure: RIGHT CAROTID ENDARTERECTOMY;  Surgeon: Serafina Mitchell, MD;  Location: [facility];  Service: Vascular;  Laterality: Right;  . EYE SURGERY  06/2013   bilateral cataract surgery  .  HUMERUS FRACTURE SURGERY     left  . MOHS SURGERY     multiple  . PATCH ANGIOPLASTY Right 11/28/2014   Procedure: PATCH ANGIOPLASTY RIGHT CAROTID;  Surgeon: Serafina Mitchell, MD;  Location: [facility];  Service: Vascular;  Laterality: Right;  . RIGHT/LEFT HEART CATH AND CORONARY ANGIOGRAPHY N/A 07/17/2017   Procedure: RIGHT/LEFT HEART CATH AND CORONARY ANGIOGRAPHY;  Surgeon: Jettie Booze, MD;  Location: [facility];  Service: Cardiovascular;  Laterality: N/A;     Home Medications:  Prior to Admission medications   Medication Sig Start Date End Date Taking? Authorizing Provider  allopurinol (ZYLOPRIM) 300 MG tablet TAKE ONE TABLET BY MOUTH ONCE DAILY 06/07/17  Yes Dorena Cookey, MD  Ascorbic Acid (VITAMIN C) 500 MG tablet Take 500 mg by mouth at bedtime.    Yes [provider]  aspirin EC 325 MG tablet Take 325 mg by mouth daily.   Yes [provider]  atorvastatin (LIPITOR) 20 MG tablet TAKE ONE TABLET BY MOUTH ONCE DAILY AT  6  PM 05/12/17  Yes Dorena Cookey, MD  benzonatate (TESSALON) 100 MG capsule Take 100 mg by mouth 3 (three) times daily.   Yes [provider]  calcitRIOL (ROCALTROL) 0.5 MCG capsule TAKE 1 CAPSULE BY MOUTH EVERY OTHER DAY 04/03/17  Yes Minus Breeding, MD  furosemide (LASIX) 80 MG tablet Take 1 tablet (80 mg total) by mouth 2 (two) times daily. 08/11/16  Yes Rhyne, Samantha J, PA-C  glipiZIDE (GLUCOTROL) 10 MG tablet Take 1 tablet (10 mg total) by mouth every evening. Patient taking differently: Take 5 mg by mouth 2 (two) times daily before a meal.  10/21/16  Yes Trinh, Janalyn Harder, PA-C  Insulin Glargine (LANTUS SOLOSTAR) 100 UNIT/ML Solostar Pen INJECT 26 UNITS SUBCUTANEOUSLY AT BEDTIME Patient taking differently: Inject 10 Units into the skin daily.  10/21/16  Yes Virgina Jock A, PA-C  metoprolol (TOPROL-XL) 200 MG 24 hr tablet Take 0.5 tablets (100 mg total) by mouth daily. 03/31/17  Yes Minus Breeding, MD  Multiple  Vitamins-Minerals (MULTIVITAMIN WITH MINERALS) tablet Take 1 tablet by mouth at bedtime.    Yes [provider]  Omega-3 Fatty Acids (FISH OIL) 500 MG CAPS Take 500 mg by mouth at bedtime.    Yes [provider]  trandolapril (MAVIK) 4 MG tablet Take 2 tablets (8 mg total) by mouth at bedtime. NEED OV. 11/29/16  Yes Minus Breeding, MD  allopurinol (ZYLOPRIM) 300 MG tablet TAKE 1 BY MOUTH DAILY Patient not taking: Reported on 07/13/2017 06/07/17   Dorena Cookey, MD  amLODipine (NORVASC) 5 MG tablet TAKE ONE TABLET BY MOUTH ONCE DAILY Patient not taking: Reported on 07/13/2017 05/12/17   Dorena Cookey, MD  BD PEN NEEDLE NANO U/F 32G X 4 MM MISC  USE AS DIRECTED DAILY 04/25/17   Dorena Cookey, MD  Lancets (ACCU-CHEK MULTICLIX) lancets 1 each by Other route daily. Dx 250.01 05/07/13   Dorena Cookey, MD  Inpatient Medications: Scheduled Meds: . aspirin  81 mg Oral Daily  . atorvastatin  20 mg Oral q1800  . calcitRIOL  0.5 mcg Oral QODAY  . calcium acetate  667 mg Oral TID WC  . feeding supplement (NEPRO CARB STEADY)  237 mL Oral TID BM  . insulin aspart  0-9 Units Subcutaneous TID WC  . insulin glargine  5 Units Subcutaneous Daily  . metoprolol  75 mg Oral QHS  . multivitamin  1 tablet Oral QHS  . omega-3 acid ethyl esters  1 g Oral QHS  . sodium chloride flush  3 mL Intravenous Q12H   Continuous Infusions: . sodium chloride    . ferric gluconate (FERRLECIT/NULECIT) IV Stopped (07/18/17 1100)  . heparin 1,200 Units/hr (07/18/17 1640)   PRN Meds: sodium chloride, acetaminophen, ondansetron (ZOFRAN) IV, ondansetron **OR** [DISCONTINUED] ondansetron (ZOFRAN) IV, sodium chloride flush  Allergies:   No Known Allergies  Social History:   Social History   Socioeconomic History  . Marital status: Married    Spouse name: Not on file  . Number of children: 4  . Years of education: Not on file  . Highest education level: Not on file  Social Needs  . Financial resource  strain: Not on file  . Food insecurity - worry: Not on file  . Food insecurity - inability: Not on file  . Transportation needs - medical: Not on file  . Transportation needs - non-medical: Not on file  Occupational History    Employer: [employer]    Comment: retired    Fish farm manager: RETIRED  Tobacco Use  . Smoking status: Former Smoker    Years: 6.00    Last attempt to quit: 06/06/1973    Years since quitting: 44.1  . Smokeless tobacco: Never Used  Substance and Sexual Activity  . Alcohol use: Yes    Alcohol/week: 4.2 oz    Types: 7 Cans of beer per week    Comment: 1 beer daily  . Drug use: No  . Sexual activity: Not on file  Other Topics Concern  . Not on file  Social History Narrative   Lives with wife.      Family History:    Family History  Problem Relation Age of Onset  . Heart attack Father        deceased 5  . Diabetes Brother        rheumatic fever, peptic ulcer disease, deceased  . Diabetes Sister        deceased 36  . Heart attack Mother        deceased age 45s  . Diabetes type II Brother   . Diabetes type II Brother      ROS:  Please see the history of present illness.  Fatigue, anorexia, weakness All other ROS reviewed and negative.     Physical Exam/Data:   Vitals:   07/18/17 1030 07/18/17 1039 07/18/17 1236 07/18/17 1657  BP: (!) 98/48 (!) 104/55 (!) 92/39 (!) 84/41  Pulse: 69 71 65 64  Resp:  18  18  Temp:  97.6 F (36.4 C) 98.1 F (36.7 C) 98 F (36.7 C)  TempSrc:  Oral Oral Oral  SpO2:  100% 94% 92%  Weight:  147 lb 0.8 oz (66.7 kg)    Height:        Intake/Output Summary (Last 24 hours) at 07/18/2017 1823 Last data filed at 07/18/2017 1039 Gross per 24 hour  Intake 355.5 ml  Output 1000 ml  Net -644.5 ml  Filed Weights   07/18/17 0200 07/18/17 0656 07/18/17 1039  Weight: 144 lb 6.4 oz (65.5 kg) 149 lb 4 oz (67.7 kg) 147 lb 0.8 oz (66.7 kg)   Body mass index is 23.73 kg/m.  General: Pleasant, elderly male, in no acute  distress HEENT: normal Lymph: no adenopathy Neck: no JVD Endocrine:  No thryomegaly Vascular: No carotid bruits; FA pulses 2+ bilaterally   Cardiac:  normal S1, S2; RRR; no murmur  Lungs:  clear to auscultation bilaterally, no wheezing, rhonchi or rales  Abd: soft, nontender, no hepatomegaly  Ext: no edema Musculoskeletal:  No deformities, BUE and BLE strength normal and equal.  Bilateral groin sites are clear Skin: warm and dry  Neuro:  CNs 2-12 intact, no focal abnormalities noted Psych:  Normal affect   EKG:  The EKG was personally reviewed and demonstrates: Normal sinus rhythm with marked ST changes consistent with lateral ischemia  Telemetry:  Telemetry was personally reviewed and demonstrates: Normal sinus rhythm  Relevant CV Studies: Cardiac catheterization and echo studies are personally reviewed.  Cardiac catheterization: Conclusion     Prox RCA to Mid RCA lesion is 100% stenosed.  Ost LM to Dist LM lesion is 70% stenosed.  Prox Cx lesion is 25% stenosed.  Mid LAD lesion is 75% stenosed.  1st Diag lesion is 75% stenosed.  LV end diastolic pressure is moderately elevated, 26 mm Hg.  There is no aortic valve stenosis.  Hemodynamic findings consistent with mild pulmonary hypertension.  CO 5.7 L/min; CI 3.2; Ao sat 94%; PA sat 63%; mean PCWP 22 mm Hg   Severe three vessel CAD involving left main, LAD and RCA.   Plan for cardiac surgery consultation.  Restart heparin 8 hours post sheath pull.   Indications   NSTEMI (non-ST elevated myocardial infarction) ([place]) [I21.4 (ICD-10-CM)]  Procedural Details/Technique   Technical Details The risks, benefits, and details of the procedure were explained to the patient. The patient verbalized understanding and wanted to proceed. Informed written consent was obtained.  PROCEDURE TECHNIQUE: After Xylocaine anesthesia, a 7 French sheath was placed in the right common femoral vein. A 7 French balloontipped Swan-Ganz  catheter was advanced to the pulmonary artery under fluoroscopic guidance. Hemodynamic pressures were obtained. Oxygen saturations were obtained. After Xylocaine anesthesia, a 76F sheath was placed in the right femoral artery with a single anterior needle wall stick using ultrasound guidance. An image was captured and stored. Left coronary angiography was done using a Judkins L4 guide catheter. Right coronary angiography was done using a Judkins R4 guide catheter. Left heart cath was done using a JR4 catheter.     Contrast: 34 cc    Estimated blood loss <50 mL.  During this procedure the patient was administered the following to achieve and maintain moderate conscious sedation: Versed 1 mg, Fentanyl 25 mcg, while the patient's heart rate, blood pressure, and oxygen saturation were continuously monitored. The period of conscious sedation was 18 minutes, of which I was present face-to-face 100% of this time.  Complications   Complications documented before study signed (07/17/2017 11:39 AM EST)    No complications were associated with this study.  Documented by Jettie Booze, MD - 07/17/2017 11:26 AM EST    Coronary Findings   Diagnostic  Dominance: Right  Left Main  Ost LM to Dist LM lesion 70% stenosed  Ost LM to Dist LM lesion is 70% stenosed.  Left Anterior Descending  Mid LAD lesion 75% stenosed  Mid LAD lesion is 75%  stenosed.  First Diagonal Branch  1st Diag lesion 75% stenosed  1st Diag lesion is 75% stenosed.  Left Circumflex  Prox Cx lesion 25% stenosed  Prox Cx lesion is 25% stenosed.  Right Coronary Artery  Prox RCA to Mid RCA lesion 100% stenosed  Prox RCA to Mid RCA lesion is 100% stenosed.  Acute Marginal Branch  Collaterals  Acute Mrg filled by collaterals from Prox RCA.    Right Posterior Atrioventricular Branch  Collaterals  Post Atrio filled by collaterals from 2nd Sept.    Intervention   No interventions have been documented.  Right Heart    Right Heart Pressures Hemodynamic findings consistent with mild pulmonary hypertension. Elevated LV EDP consistent with volume overload.  Left Heart   Left Ventricle LV end diastolic pressure is moderately elevated.  Aortic Valve There is no aortic valve stenosis.  Coronary Diagrams   Diagnostic Diagram       Implants     No implant documentation for this case.  MERGE Images   Show images for CARDIAC CATHETERIZATION   Link to Procedure Log   Procedure Log    Hemo Data    Most Recent Value  Fick Cardiac Output 5.72 L/min  Fick Cardiac Output Index 3.29 (L/min)/BSA  RA A Wave 17 mmHg  RA V Wave 14 mmHg  RA Mean 14 mmHg  RV Systolic Pressure 49 mmHg  RV Diastolic Pressure 7 mmHg  RV EDP 16 mmHg  PA Systolic Pressure 47 mmHg  PA Diastolic Pressure 21 mmHg  PA Mean 32 mmHg  PW A Wave 24 mmHg  PW V Wave 32 mmHg  PW Mean 22 mmHg  AO Systolic Pressure 96 mmHg  AO Diastolic Pressure 50 mmHg  AO Mean 68 mmHg  LV Systolic Pressure 88 mmHg  LV Diastolic Pressure 3 mmHg  LV EDP 26 mmHg  Arterial Occlusion Pressure Extended Systolic Pressure 88 mmHg  Arterial Occlusion Pressure Extended Diastolic Pressure 39 mmHg  Arterial Occlusion Pressure Extended Mean Pressure 59 mmHg  Left Ventricular Apex Extended Systolic Pressure 88 mmHg  Left Ventricular Apex Extended Diastolic Pressure 6 mmHg  Left Ventricular Apex Extended EDP Pressure 27 mmHg  QP/QS 1  TPVR Index 9.74 HRUI  TSVR Index 20.7 HRUI  PVR SVR Ratio 0.19  TPVR/TSVR Ratio 0.47   2D echocardiogram: Study Conclusions  - Left ventricle: Diffuse hypokinesis worse in the mid and basal   inferior wall. The cavity size was moderately dilated. Wall   thickness was normal. The estimated ejection fraction was 25%.   Doppler parameters are consistent with both elevated ventricular   end-diastolic filling pressure and elevated left atrial filling   pressure. - Mitral valve: There was mild regurgitation. - Left  atrium: The atrium was moderately dilated. - Right ventricle: The cavity size was mildly dilated. - Right atrium: The atrium was mildly dilated. - Atrial septum: No defect or patent foramen ovale was identified. - Tricuspid valve: There was moderate regurgitation. - Pulmonary arteries: PA peak pressure: 38 mm Hg (S).  Laboratory Data:  Chemistry Recent Labs  Lab 07/16/17 0653 07/17/17 0710 07/18/17 0523  NA 141 137 136  K 4.2 4.2 4.6  CL 100* 96* 95*  CO2 23 21* 22  GLUCOSE 105* 128* 165*  BUN 63* 29* 48*  CREATININE 6.21* 4.49* 6.13*  CALCIUM 9.1 9.0 9.2  GFRNONAA 8* 11* 8*  GFRAA 9* 13* 9*  ANIONGAP 18* 20* 19*    Recent Labs  Lab 07/16/17 0653 07/17/17 0710 07/18/17 2130  ALBUMIN 3.0* 3.2* 3.1*   Hematology Recent Labs  Lab 07/16/17 0653 07/17/17 0455 07/18/17 0523  WBC 7.9 9.1 8.7  RBC 2.93* 3.14* 3.08*  HGB 9.0* 9.6* 9.5*  HCT 28.3* 30.4* 29.8*  MCV 96.6 96.8 96.8  MCH 30.7 30.6 30.8  MCHC 31.8 31.6 31.9  RDW 16.0* 16.2* 16.4*  PLT 193 198 184   Cardiac Enzymes Recent Labs  Lab 07/13/17 0900 07/14/17 1605 07/15/17 0215  TROPONINI 3.70* 4.40* 4.29*   No results for input(s): TROPIPOC in the last 168 hours.  BNPNo results for input(s): BNP, PROBNP in the last 168 hours.  DDimer No results for input(s): DDIMER in the last 168 hours.  Radiology/Studies:  No results found.  Assessment and Plan:   1. Elevated troponin/demand ischemia 2. Severe left main and multivessel coronary artery disease 3. Acute on chronic combined systolic and diastolic heart failure 4. End-stage renal disease, new to hemodialysis  The patient has severe left mainstem disease in the context of a chronically occluded right coronary artery and severe LV systolic dysfunction with background medical problems of end-stage renal disease and long-standing diabetes. He would be at very high risk of complication with PCI which would require rotational or orbital atherectomy, PTCA,  and stenting of the left mainstem into the LAD.  He has been turned down for CABG.  Alternatively he could be treated with palliative medical therapy.  After discussion of pros and cons of PCI versus palliative medical therapy, I have recommended the patient initially be treated medically with close outpatient follow-up to see how he is tolerating dialysis.  I do not think he has truly had an acute coronary event, but rather has severe chronic coronary artery disease with elevated troponin occurring with this presentation of uremia.  However, I do think the patient will do poorly with medical therapy for his coronary artery disease over the long-term.  I will see him back in 3-4 weeks for further discussion and consideration of hemodynamically supported atherectomy and stenting of the left mainstem.  Recommend starting him on Plavix 75 mg daily in addition to aspirin 81 mg daily.  All of their questions are answered today.  Will arrange outpatient follow-up as detailed.  For questions or updates, please contact [place] Please consult [email or web address] for contact info under Cardiology/STEMI.   Signed, Sherren Mocha, MD  07/18/2017 6:23 PM

## 2017-07-19 DIAGNOSIS — M1A0790 Idiopathic chronic gout, unspecified ankle and foot, without tophus (tophi): Secondary | ICD-10-CM

## 2017-07-19 LAB — RENAL FUNCTION PANEL
ALBUMIN: 3 g/dL — AB (ref 3.5–5.0)
Anion gap: 17 — ABNORMAL HIGH (ref 5–15)
BUN: 34 mg/dL — AB (ref 6–20)
CALCIUM: 9 mg/dL (ref 8.9–10.3)
CO2: 24 mmol/L (ref 22–32)
CREATININE: 5.04 mg/dL — AB (ref 0.61–1.24)
Chloride: 95 mmol/L — ABNORMAL LOW (ref 101–111)
GFR calc Af Amer: 12 mL/min — ABNORMAL LOW (ref >60)
GFR calc non Af Amer: 10 mL/min — ABNORMAL LOW (ref >60)
GLUCOSE: 101 mg/dL — AB (ref 65–99)
PHOSPHORUS: 5.7 mg/dL — AB (ref 2.5–4.6)
Potassium: 4.1 mmol/L (ref 3.5–5.1)
SODIUM: 136 mmol/L (ref 135–145)

## 2017-07-19 LAB — GLUCOSE, CAPILLARY
GLUCOSE-CAPILLARY: 96 mg/dL (ref 65–99)
Glucose-Capillary: 207 mg/dL — ABNORMAL HIGH (ref 65–99)

## 2017-07-19 MED ORDER — METOPROLOL SUCCINATE ER 25 MG PO TB24: 75.0000 mg | ORAL_TABLET | Freq: Every day | ORAL | 0 refills | Status: DC

## 2017-07-19 MED ORDER — CALCIUM ACETATE (PHOS BINDER) 667 MG PO CAPS: 667.0000 mg | ORAL_CAPSULE | Freq: Three times a day (TID) | ORAL | 0 refills | Status: AC

## 2017-07-19 MED ORDER — CLOPIDOGREL BISULFATE 75 MG PO TABS: 75.0000 mg | ORAL_TABLET | Freq: Every day | ORAL | 0 refills | Status: DC

## 2017-07-19 MED ORDER — NEPRO/CARBSTEADY PO LIQD: 237.0000 mL | Freq: Three times a day (TID) | ORAL | 0 refills | Status: AC

## 2017-07-19 MED ORDER — ASPIRIN 81 MG PO CHEW: 81.0000 mg | CHEWABLE_TABLET | Freq: Every day | ORAL | 0 refills | Status: AC

## 2017-07-19 NOTE — Progress Notes (Signed)
Physical Therapy Treatment Patient Details Name: Roxie Gueye MRN: [id number] DOB: May 29, 1941 Today's Date: 07/19/2017    History of Present Illness Pt is a 78 y/o male admitted secondary to Acute on chronic renal failure. Pt also with elevated troponin, however, per RN likely due to renal failure. Pt s/p R/L heart cath on 2/10 - no surgical interventions indicated per Cardiology. PMH inlucdes R AV fistula placement, HTN, DM, gout, PVD, CAD, CKD V, and skin cancer.     PT Comments    Pt making fair progress with functional mobility. Pt ambulated in hallway with [place] and close min guard for safety, but no LOB or need for physical assistance. Pt's HR stable throughout. Pt would continue to benefit from skilled physical therapy services at this time while admitted and after d/c to address the below listed limitations in order to improve overall safety and independence with functional mobility.    Follow Up Recommendations  Home health PT;Supervision for mobility/OOB     Equipment Recommendations  Rolling walker with 5" wheels    Recommendations for Other Services       Precautions / Restrictions Precautions Precautions: Fall Restrictions Weight Bearing Restrictions: No    Mobility  Bed Mobility Overal bed mobility: Modified Independent             General bed mobility comments: increased time  Transfers Overall transfer level: Needs assistance Equipment used: None Transfers: Sit to/from Stand Sit to Stand: Min guard         General transfer comment: min guard for safety, no instability with transitional movement  Ambulation/Gait Ambulation/Gait assistance: Min guard Ambulation Distance (Feet): 50 Feet Assistive device: 1 person hand held assist Gait Pattern/deviations: Step-through pattern;Decreased step length - right;Decreased step length - left;Decreased stride length Gait velocity: Decreased  Gait velocity interpretation: Below normal speed for  age/gender General Gait Details: mild instability but no overt LOB, close min guard for safety. pt limited secondary to fatigue   Stairs            Wheelchair Mobility    Modified Rankin (Stroke Patients Only)       Balance Overall balance assessment: Needs assistance Sitting-balance support: No upper extremity supported;Feet supported Sitting balance-Leahy Scale: Good     Standing balance support: During functional activity;Single extremity supported Standing balance-Leahy Scale: Poor                              Cognition Arousal/Alertness: Awake/alert Behavior During Therapy: WFL for tasks assessed/performed Overall Cognitive Status: Within Functional Limits for tasks assessed                                        Exercises      General Comments        Pertinent Vitals/Pain Pain Assessment: No/denies pain    Home Living                      Prior Function            PT Goals (current goals can now be found in the care plan section) Acute Rehab PT Goals PT Goal Formulation: With patient Time For Goal Achievement: 07/28/17 Potential to Achieve Goals: Good Progress towards PT goals: Progressing toward goals    Frequency    Min 3X/week      PT Plan  Current plan remains appropriate    Co-evaluation              AM-PAC PT "6 Clicks" Daily Activity  Outcome Measure  Difficulty turning over in bed (including adjusting bedclothes, sheets and blankets)?: None Difficulty moving from lying on back to sitting on the side of the bed? : None Difficulty sitting down on and standing up from a chair with arms (e.g., wheelchair, bedside commode, etc,.)?: Unable Help needed moving to and from a bed to chair (including a wheelchair)?: A Little Help needed walking in hospital room?: A Little Help needed climbing 3-5 steps with a railing? : A Little 6 Click Score: 18    End of Session Equipment Utilized During  Treatment: Gait belt Activity Tolerance: Patient limited by fatigue Patient left: in bed;with call bell/phone within reach Nurse Communication: Mobility status PT Visit Diagnosis: Unsteadiness on feet (R26.81);Muscle weakness (generalized) (M62.81)     Time: 9449-6759 PT Time Calculation (min) (ACUTE ONLY): 10 min  Charges:  $Gait Training: 8-22 mins                    G Codes:       [place], [place], [place] 07/19/2017, 9:31 AM

## 2017-07-19 NOTE — Progress Notes (Signed)
Discussed with pt and family MI, gradually increasing activity/walking, NTG if prescribed, Plavix. Voiced understanding. He is not a candidate for CRPII due to severe LM and ? PCI in the future.  [id number] [place], ACSM 10:49 AM 07/19/2017

## 2017-07-19 NOTE — Discharge Summary (Signed)
Physician Discharge Summary  Douglas Meyer TIW:[id number] DOB: 1940-07-20 DOA: 07/13/2017  PCP: Dorena Cookey, MD  Admit date: 07/13/2017 Discharge date: 07/19/2017  Admitted From: Home Disposition: Home with Home Health PT/OT  Recommendations for Outpatient Follow-up:  1. Follow up with PCP in 1-2 weeks 2. Follow up with Nephrology within 1 week 3. Follow up with Cardiology Dr. Burt Knack in 3-4 weeks 4. Please obtain CMP/CBC, Mag, Phos in one week 5. Please follow up on the following pending results:  Home Health: Yes Equipment/Devices: Conservation officer, nature with 5" Wheels  Discharge Condition: Stable CODE STATUS: FULL CODE Diet recommendation: Heart Healthy Carb Modified Diet  Brief/Interim Summary: Douglas Meyer is a 77 y.o. malewithhistory of chronic kidney disease stage V, CAD, gout, diabetes mellitus, hyperlipidemia, anemia. He presented secondary to elevated creatinine and BUN seen at PCP's office concerning for acute kidney injury. No mental status changes. Nephrology consulted and hemodialysis started. Concern for NSTEMI. Transthoracic Echocardiogram with EF of 25% and diffuse hypokinesis Heart cath significant for 3 vessel disease but patient is not a candidate for surgical management per CT surgery and was not a candidate for Cardiac Cath and PCI at this time given high risk because of the chronic occulsion of his RCA and severe [place] Disease. It was recommended that he have medical management with outpatient evaluation for consideration of Cardiac Catheterization. He was scheduled for Outpatient Dialysis TThSat with the eventual hopes of transitioning to Peritoneal Dialysis. Patient deemed medically stable to D/C Home with Home Health PT and will need to follow up with PCP, Nephrology, and Cardiology.  Discharge Diagnoses:  Principal Problem:   Renal failure (ARF), acute on chronic (HCC) Active Problems:   Insulin dependent diabetes mellitus (HCC)   Gout   Essential  hypertension   Non-ST elevation (NSTEMI) myocardial infarction [facility])   Peripheral vascular disease (HCC)   Coronary artery disease involving native coronary artery of native heart without angina pectoris   Elevated troponin   Acute systolic (congestive) heart failure ([place])  Acute kidney injury on CKD 5 Uremia -Patient previously considered for peritoneal dialysis. Baseline creatinine of about 4.1-4.8. 10.00 on admission with severely elevated BUN. -No mental status changes. Oliguric -Nephrology recommendations: Dialysis -UOP (strict in/out) -Will be Dialyzed as an outpatient TThSat  Diabetes mellitus, insulin dependent -Last hemoglobin A1C of 7.6%.  -For his age, his hemoglobin A1C is adequate. - -C/w Home Insulin  -Discontinue glipizide on discharge.  Essential Hypertension -Blood pressure soft but stable -Continue to hold antihypertensives and D/C'd Hydralazine and ARB -C/w BB at Reduced dose  Peripheral vascular disease CAD -No chest pain. See Below -Troponin elevated. LDL at goal -Continue aspirin and Lipitor -Medical Management  Gout -Stable.  Anemia -Normocytic and Iron Deficient.  -Secondary to chronic disease from kidney disease.  -Stable. No evidence of bleeding.  -Iron and ferritin within normal limits.  -S/p IV Iron -Repeat CBC in AM  NSTEMI/CAD with Severe Multivessel Disease -EKG ST depressions with elevated troponin. -Cath significant for 3 vessel disease. CT surgery consulted and patient not a candidate for surgery.  -Echocardiogram performed and significant for an EF of 25% with diffuse hypokinesis. -Cardiology evaluated and was High risk for PCI so Medical Management was recommended -Continue aspirin and Lipitor -C/w BB at reduced Dose -No ACE given CKD and Hypotension  Chronic Systolic Heart Failure -Volume Controlled with Dialysis -EF of 25% -C/w Outpatient Dialysis  Discharge Instructions  Discharge Instructions    Call MD for:   difficulty breathing, headache or visual  disturbances   Complete by:  As directed    Call MD for:  extreme fatigue   Complete by:  As directed    Call MD for:  hives   Complete by:  As directed    Call MD for:  persistant dizziness or light-headedness   Complete by:  As directed    Call MD for:  persistant nausea and vomiting   Complete by:  As directed    Call MD for:  redness, tenderness, or signs of infection (pain, swelling, redness, odor or green/yellow discharge around incision site)   Complete by:  As directed    Call MD for:  severe uncontrolled pain   Complete by:  As directed    Call MD for:  temperature >100.4   Complete by:  As directed    Diet - low sodium heart healthy   Complete by:  As directed    Discharge instructions   Complete by:  As directed    Follow up with PCP, Cardiology, and Nephrology at D/C. Follow up with outpatient Dialysis as scheduled.Take all medications as prescribed. If symptoms change or worsen please return to the ED for evaluation.   Increase activity slowly   Complete by:  As directed      Allergies as of 07/19/2017   No Known Allergies     Medication List    STOP taking these medications   amLODipine 5 MG tablet Commonly known as:  NORVASC   aspirin EC 325 MG tablet Replaced by:  aspirin 81 MG chewable tablet   furosemide 80 MG tablet Commonly known as:  LASIX   glipiZIDE 10 MG tablet Commonly known as:  GLUCOTROL   trandolapril 4 MG tablet Commonly known as:  MAVIK     TAKE these medications   accu-chek multiclix lancets 1 each by Other route daily. Dx 250.01   allopurinol 300 MG tablet Commonly known as:  ZYLOPRIM TAKE ONE TABLET BY MOUTH ONCE DAILY What changed:  Another medication with the same name was removed. Continue taking this medication, and follow the directions you see here.   aspirin 81 MG chewable tablet Chew 1 tablet (81 mg total) by mouth daily. Start taking on:  07/20/2017 Replaces:  aspirin EC 325 MG  tablet   atorvastatin 20 MG tablet Commonly known as:  LIPITOR TAKE ONE TABLET BY MOUTH ONCE DAILY AT  6  PM   BD PEN NEEDLE NANO U/F 32G X 4 MM Misc Generic drug:  Insulin Pen Needle USE AS DIRECTED DAILY   benzonatate 100 MG capsule Commonly known as:  TESSALON Take 100 mg by mouth 3 (three) times daily.   calcitRIOL 0.5 MCG capsule Commonly known as:  ROCALTROL TAKE 1 CAPSULE BY MOUTH EVERY OTHER DAY   calcium acetate 667 MG capsule Commonly known as:  PHOSLO Take 1 capsule (667 mg total) by mouth 3 (three) times daily with meals.   clopidogrel 75 MG tablet Commonly known as:  PLAVIX Take 1 tablet (75 mg total) by mouth daily. Start taking on:  07/20/2017   feeding supplement (NEPRO CARB STEADY) Liqd Take 237 mLs by mouth 3 (three) times daily between meals.   Fish Oil 500 MG Caps Take 500 mg by mouth at bedtime.   Insulin Glargine 100 UNIT/ML Solostar Pen Commonly known as:  LANTUS SOLOSTAR INJECT 26 UNITS SUBCUTANEOUSLY AT BEDTIME What changed:    how much to take  how to take this  when to take this  additional instructions  metoprolol succinate 25 MG 24 hr tablet Commonly known as:  TOPROL-XL Take 3 tablets (75 mg total) by mouth at bedtime. What changed:    medication strength  how much to take  when to take this   multivitamin with minerals tablet Take 1 tablet by mouth at bedtime.   vitamin C 500 MG tablet Commonly known as:  ASCORBIC ACID Take 500 mg by mouth at bedtime.            Durable Medical Equipment  (From admission, onward)        Start     Ordered   07/14/17 1536  For home use only DME Walker rolling  Once    Question Answer Comment  Patient needs a walker to treat with the following condition Unsteadiness on feet   Patient needs a walker to treat with the following condition Muscle weakness      07/14/17 1536     Follow-up Information    [facility] Follow up.   Specialty:  [place] Why:  HHPT, HHOT Contact information: [place] [address] [phone]        Lonn Georgia, PA-C Follow up on 07/28/2017.   Specialties:  Cardiology, Radiology Why:  9:00 am for [facility] Contact information: [address] [place] [address] [phone]          No Known Allergies  Consultations:  Cardiology  Nephrology  Procedures/Studies: US Renal  Result Date: 07/13/2017 CLINICAL DATA:  Acute onset of renal failure. Chronic renal disease. EXAM: RENAL / URINARY TRACT ULTRASOUND COMPLETE COMPARISON:  None. FINDINGS: Right Kidney: Length: 10.8 cm. Echogenicity within normal limits. Diffuse cortical thinning is noted. No mass or hydronephrosis visualized. Left Kidney: Length: 10.0 cm. Echogenicity within normal limits. Diffuse cortical thinning is noted. No mass or hydronephrosis visualized. Bladder: Appears normal for degree of bladder distention. The prostate is borderline enlarged, measuring 4.9 cm in transverse dimension. Small bilateral pleural effusions are noted. IMPRESSION: 1. No evidence of hydronephrosis. 2. Diffuse renal cortical thinning reflects chronic renal disease. 3. Small bilateral pleural effusions. 4. Borderline enlarged prostate. Electronically Signed   By: Garald Balding M.D.   On: 07/13/2017 04:47    ECHOCARDIOGRAM 07/16/17 ------------------------------------------------------------------- Study Conclusions  - Left ventricle: Diffuse hypokinesis worse in the mid and basal   inferior wall. The cavity size was moderately dilated. Wall   thickness was normal. The estimated ejection fraction was 25%.   Doppler parameters are consistent with both elevated ventricular   end-diastolic filling pressure and elevated left atrial filling   pressure. - Mitral valve: There was mild regurgitation. - Left atrium: The atrium was moderately dilated. - Right ventricle: The cavity size was mildly dilated. - Right  atrium: The atrium was mildly dilated. - Atrial septum: No defect or patent foramen ovale was identified. - Tricuspid valve: There was moderate regurgitation. - Pulmonary arteries: PA peak pressure: 38 mm Hg (S).  CARDIAC CATHETERIZATION 07/17/17  Prox RCA to Mid RCA lesion is 100% stenosed.  Ost LM to Dist LM lesion is 70% stenosed.  Prox Cx lesion is 25% stenosed.  Mid LAD lesion is 75% stenosed.  1st Diag lesion is 75% stenosed.  LV end diastolic pressure is moderately elevated, 26 mm Hg.  There is no aortic valve stenosis.  Hemodynamic findings consistent with mild pulmonary hypertension.  CO 5.7 L/min; CI 3.2; Ao sat 94%; PA sat 63%; mean PCWP 22 mm Hg   Severe three  vessel CAD involving left main, LAD and RCA.   Subjective: Seen and examined at bedside and had no complaints. No CP or SOB. Wanting to go home.  Discharge Exam: Vitals:   07/19/17 0417 07/19/17 0700  BP: (!) 108/51 (!) 107/52  Pulse: 76 81  Resp: (!) 21 18  Temp: 98.2 F (36.8 C) 98 F (36.7 C)  SpO2: 93% 96%   Vitals:   07/18/17 1935 07/18/17 2000 07/19/17 0417 07/19/17 0700  BP: (!) 101/40  (!) 108/51 (!) 107/52  Pulse: 70  76 81  Resp: 14 14 (!) 21 18  Temp: 98.2 F (36.8 C)  98.2 F (36.8 C) 98 F (36.7 C)  TempSrc: Oral  Oral Oral  SpO2: 100%  93% 96%  Weight:   67 kg (147 lb 11.3 oz)   Height:       General: Pt is alert, awake, not in acute distress Cardiovascular: RRR, S1/S2 +, no rubs, no gallops Respiratory: CTA bilaterally, no wheezing, no rhonchi Abdominal: Soft, NT, ND, bowel sounds + Extremities: no edema, no cyanosis  The results of significant diagnostics from this hospitalization (including imaging, microbiology, ancillary and laboratory) are listed below for reference.    Microbiology: No results found for this or any previous visit (from the past 240 hour(s)).   Labs: BNP (last 3 results) No results for input(s): BNP in the last 8760 hours. Basic Metabolic  Panel: Recent Labs  Lab 07/15/17 0215 07/16/17 0653 07/17/17 0710 07/18/17 0523 07/19/17 0314  NA 134* 141 137 136 136  K 4.1 4.2 4.2 4.6 4.1  CL 98* 100* 96* 95* 95*  CO2 18* 23 21* 22 24  GLUCOSE 164* 105* 128* 165* 101*  BUN 117* 63* 29* 48* 34*  CREATININE 7.96* 6.21* 4.49* 6.13* 5.04*  CALCIUM 8.7* 9.1 9.0 9.2 9.0  PHOS 6.6* 6.5* 4.9* 7.7* 5.7*   Liver Function Tests: Recent Labs  Lab 07/15/17 0215 07/16/17 0653 07/17/17 0710 07/18/17 0523 07/19/17 0314  ALBUMIN 2.6* 3.0* 3.2* 3.1* 3.0*   No results for input(s): LIPASE, AMYLASE in the last 168 hours. No results for input(s): AMMONIA in the last 168 hours. CBC: Recent Labs  Lab 07/15/17 0216 07/15/17 0742 07/16/17 0653 07/17/17 0455 07/18/17 0523  WBC 7.1 8.1 7.9 9.1 8.7  HGB 9.1* 8.8* 9.0* 9.6* 9.5*  HCT 27.3* 27.2* 28.3* 30.4* 29.8*  MCV 92.9 94.1 96.6 96.8 96.8  PLT 236 198 193 198 184   Cardiac Enzymes: Recent Labs  Lab 07/13/17 0900 07/14/17 1605 07/15/17 0215  TROPONINI 3.70* 4.40* 4.29*   BNP: Invalid input(s): POCBNP CBG: Recent Labs  Lab 07/18/17 1232 07/18/17 1652 07/18/17 2111 07/19/17 0608 07/19/17 1238  GLUCAP 135* 168* 111* 96 207*   D-Dimer No results for input(s): DDIMER in the last 72 hours. Hgb A1c No results for input(s): HGBA1C in the last 72 hours. Lipid Profile No results for input(s): CHOL, HDL, LDLCALC, TRIG, CHOLHDL, LDLDIRECT in the last 72 hours. Thyroid function studies No results for input(s): TSH, T4TOTAL, T3FREE, THYROIDAB in the last 72 hours.  Invalid input(s): FREET3 Anemia work up No results for input(s): VITAMINB12, FOLATE, FERRITIN, TIBC, IRON, RETICCTPCT in the last 72 hours. Urinalysis    Component Value Date/Time   COLORURINE YELLOW 07/12/2017 2104   APPEARANCEUR CLEAR 07/12/2017 2104   LABSPEC 1.011 07/12/2017 2104   PHURINE 5.0 07/12/2017 2104   GLUCOSEU NEGATIVE 07/12/2017 2104   HGBUR NEGATIVE 07/12/2017 2104   HGBUR negative  06/28/2010 0834   BILIRUBINUR NEGATIVE  07/12/2017 2104   BILIRUBINUR n 04/19/2016 1205   KETONESUR NEGATIVE 07/12/2017 2104   PROTEINUR NEGATIVE 07/12/2017 2104   UROBILINOGEN 0.2 04/19/2016 1205   UROBILINOGEN 0.2 11/20/2014 1509   NITRITE NEGATIVE 07/12/2017 2104   LEUKOCYTESUR NEGATIVE 07/12/2017 2104   Sepsis Labs Invalid input(s): PROCALCITONIN,  WBC,  LACTICIDVEN Microbiology No results found for this or any previous visit (from the past 240 hour(s)).  Time coordinating discharge: 35 minutes  SIGNED:  Kerney Elbe, DO [facility] 07/19/2017, 12:46 PM Pager [phone]  If 7PM-7AM, please contact night-coverage [email or web address] Password [email or web address]

## 2017-07-19 NOTE — Progress Notes (Signed)
Occupational Therapy Treatment Patient Details Name: Douglas Meyer MRN: [id number] DOB: 03/03/1941 Today's Date: 07/19/2017    History of present illness Pt is a 77 y/o male admitted secondary to Acute on chronic renal failure. Pt also with elevated troponin, however, per RN likely due to renal failure. Pt s/p R/L heart cath on 2/10 - no surgical interventions indicated per Cardiology. PMH inlucdes R AV fistula placement, HTN, DM, gout, PVD, CAD, CKD V, and skin cancer.    OT comments  Pt able to perform LB dressing, toilet transfer, and tub/shower transfer with min guard overall. Pt with LOB x2 during mobility--required min guard to min assist to correct. Educated pt and family on home safety and fall prevention strategies. D/c plan remains appropriate. Will continue to follow acutely.   Follow Up Recommendations  Home health OT;Supervision/Assistance - 24 hour    Equipment Recommendations  None recommended by OT    Recommendations for Other Services      Precautions / Restrictions Precautions Precautions: Fall Restrictions Weight Bearing Restrictions: No       Mobility Bed Mobility Overal bed mobility: Modified Independent                Transfers Overall transfer level: Needs assistance Equipment used: None Transfers: Sit to/from Stand Sit to Stand: Min guard         General transfer comment: for safety, no major LOB initially    Balance Overall balance assessment: Needs assistance Sitting-balance support: Feet supported;No upper extremity supported Sitting balance-Leahy Scale: Good     Standing balance support: No upper extremity supported;During functional activity Standing balance-Leahy Scale: Poor Standing balance comment: LOB x2 requiring min guard to min assist to correct                           ADL either performed or assessed with clinical judgement   ADL Overall ADL's : Needs assistance/impaired                      Lower Body Dressing: Min guard;Sit to/from stand   Toilet Transfer: Min guard;Ambulation;Comfort height toilet;Grab bars Toilet Transfer Details (indicate cue type and reason): Utilized grab bar on L side to simulate home     Tub/ Shower Transfer: Min guard;Tub transfer;Ambulation Tub/Shower Transfer Details (indicate cue type and reason): Discussed possible use of walk in shower initially with shower chair for safety with bathing; pt and wife are agreeable Functional mobility during ADLs: Min guard General ADL Comments: Pt with LOB x2 requiring min guard to min assist to correct. Educated pt and family on home safety and fall prevention strategies.     Vision       Perception     Praxis      Cognition Arousal/Alertness: Awake/alert Behavior During Therapy: WFL for tasks assessed/performed Overall Cognitive Status: Within Functional Limits for tasks assessed                                          Exercises     Shoulder Instructions       General Comments      Pertinent Vitals/ Pain       Pain Assessment: No/denies pain  Home Living  Prior Functioning/Environment              Frequency  Min 2X/week        Progress Toward Goals  OT Goals(current goals can now be found in the care plan section)  Progress towards OT goals: Progressing toward goals  Acute Rehab OT Goals Patient Stated Goal: get better OT Goal Formulation: With patient  Plan Discharge plan remains appropriate    Co-evaluation                 AM-PAC PT "6 Clicks" Daily Activity     Outcome Measure   Help from another person eating meals?: None Help from another person taking care of personal grooming?: A Little Help from another person toileting, which includes using toliet, bedpan, or urinal?: A Little Help from another person bathing (including washing, rinsing, drying)?: A Little Help from  another person to put on and taking off regular upper body clothing?: None Help from another person to put on and taking off regular lower body clothing?: A Little 6 Click Score: 20    End of Session    OT Visit Diagnosis: Unsteadiness on feet (R26.81);Muscle weakness (generalized) (M62.81)   Activity Tolerance Patient tolerated treatment well   Patient Left in bed;with call bell/phone within reach;with bed alarm set;with family/visitor present   Nurse Communication          Time: 0109-3235 OT Time Calculation (min): 11 min  Charges: OT General Charges $OT Visit: 1 Visit OT Treatments $Self Care/Home Management : 8-22 mins  [name], M.S., OTR/L Pager: [place] 07/19/2017, 1:47 PM

## 2017-07-19 NOTE — Progress Notes (Signed)
Progress Note  Patient Name: Douglas Meyer Date of Encounter: 07/19/2017  Primary Cardiologist: Minus Breeding, MD   Subjective   Pt denies chest pain and problems with bleeding.  Inpatient Medications    Scheduled Meds: . aspirin  81 mg Oral Daily  . atorvastatin  20 mg Oral q1800  . calcitRIOL  0.5 mcg Oral QODAY  . calcium acetate  667 mg Oral TID WC  . clopidogrel  75 mg Oral Daily  . feeding supplement (NEPRO CARB STEADY)  237 mL Oral TID BM  . insulin aspart  0-9 Units Subcutaneous TID WC  . insulin glargine  5 Units Subcutaneous Daily  . metoprolol  75 mg Oral QHS  . multivitamin  1 tablet Oral QHS  . omega-3 acid ethyl esters  1 g Oral QHS  . sodium chloride flush  3 mL Intravenous Q12H   Continuous Infusions: . sodium chloride    . ferric gluconate (FERRLECIT/NULECIT) IV Stopped (07/18/17 1100)   PRN Meds: sodium chloride, acetaminophen, ondansetron (ZOFRAN) IV, ondansetron **OR** [DISCONTINUED] ondansetron (ZOFRAN) IV, sodium chloride flush   Vital Signs    Vitals:   07/18/17 1935 07/18/17 2000 07/19/17 0417 07/19/17 0700  BP: (!) 101/40  (!) 108/51 (!) 107/52  Pulse: 70  76 81  Resp: 14 14 (!) 21 18  Temp: 98.2 F (36.8 C)  98.2 F (36.8 C) 98 F (36.7 C)  TempSrc: Oral  Oral Oral  SpO2: 100%  93% 96%  Weight:   147 lb 11.3 oz (67 kg)   Height:        Intake/Output Summary (Last 24 hours) at 07/19/2017 1122 Last data filed at 07/19/2017 0700 Gross per 24 hour  Intake 360 ml  Output 0 ml  Net 360 ml   Filed Weights   07/18/17 0656 07/18/17 1039 07/19/17 0417  Weight: 149 lb 4 oz (67.7 kg) 147 lb 0.8 oz (66.7 kg) 147 lb 11.3 oz (67 kg)    Telemetry     - Personally Reviewed  ECG    No new tracings - Personally Reviewed  Physical Exam   GEN: No acute distress.   Neck: No JVD Cardiac: RRR, no murmurs, rubs, or gallops.  Respiratory: Clear to auscultation bilaterally. GI: Soft, nontender, non-distended  MS: No edema; No  deformity. Neuro:  Nonfocal  Psych: Normal affect   Labs    Chemistry Recent Labs  Lab 07/17/17 0710 07/18/17 0523 07/19/17 0314  NA 137 136 136  K 4.2 4.6 4.1  CL 96* 95* 95*  CO2 21* 22 24  GLUCOSE 128* 165* 101*  BUN 29* 48* 34*  CREATININE 4.49* 6.13* 5.04*  CALCIUM 9.0 9.2 9.0  ALBUMIN 3.2* 3.1* 3.0*  GFRNONAA 11* 8* 10*  GFRAA 13* 9* 12*  ANIONGAP 20* 19* 17*     Hematology Recent Labs  Lab 07/16/17 0653 07/17/17 0455 07/18/17 0523  WBC 7.9 9.1 8.7  RBC 2.93* 3.14* 3.08*  HGB 9.0* 9.6* 9.5*  HCT 28.3* 30.4* 29.8*  MCV 96.6 96.8 96.8  MCH 30.7 30.6 30.8  MCHC 31.8 31.6 31.9  RDW 16.0* 16.2* 16.4*  PLT 193 198 184    Cardiac Enzymes Recent Labs  Lab 07/13/17 0900 07/14/17 1605 07/15/17 0215  TROPONINI 3.70* 4.40* 4.29*   No results for input(s): TROPIPOC in the last 168 hours.   BNPNo results for input(s): BNP, PROBNP in the last 168 hours.   DDimer No results for input(s): DDIMER in the last 168 hours.   Radiology  No results found.  Cardiac Studies   Heart cath 07/17/17:  Prox RCA to Mid RCA lesion is 100% stenosed.  Ost LM to Dist LM lesion is 70% stenosed.  Prox Cx lesion is 25% stenosed.  Mid LAD lesion is 75% stenosed.  1st Diag lesion is 75% stenosed.  LV end diastolic pressure is moderately elevated, 26 mm Hg.  There is no aortic valve stenosis.  Hemodynamic findings consistent with mild pulmonary hypertension.  CO 5.7 L/min; CI 3.2; Ao sat 94%; PA sat 63%; mean PCWP 22 mm Hg   Severe three vessel CAD involving left main, LAD and RCA.   Plan for cardiac surgery consultation.  Restart heparin 8 hours post sheath pull.   Echo 07/16/17: Study Conclusions - Left ventricle: Diffuse hypokinesis worse in the mid and basal   inferior wall. The cavity size was moderately dilated. Wall   thickness was normal. The estimated ejection fraction was 25%.   Doppler parameters are consistent with both elevated ventricular    end-diastolic filling pressure and elevated left atrial filling   pressure. - Mitral valve: There was mild regurgitation. - Left atrium: The atrium was moderately dilated. - Right ventricle: The cavity size was mildly dilated. - Right atrium: The atrium was mildly dilated. - Atrial septum: No defect or patent foramen ovale was identified. - Tricuspid valve: There was moderate regurgitation. - Pulmonary arteries: PA peak pressure: 38 mm Hg (S).  Patient Profile     77 y.o. male with a hx of CKD 4 now ESRD who is being seen today for the evaluation of severe multivessel CAD. He was turned down for CABG and for PCI by Dr. Burt Knack yesterday.  Assessment & Plan    1. CAD, Severe multivessel disease Pt has been deemed not a candidate for CABG. He was evaluated by Dr. Burt Knack, interventionalist, yesterday for high-risk PCI. Given his high risk, he recommended a trial of medical therapy before considering high risk PCI. He was started on ASA and plavix. He will continue on toprol and lipitor. He is stable for discharge from a cardiology standpoint. Follow up has been made.    2. HTN Pressures have been well-controlled on current regimen. Continue following pressures at home.    3. Chronic systolic heart failure Echo this admission with EF 25%. Fluid status per dialysis.    4. ESRD now on HD Admitted with uremia. He underwent HD and will be setup for OP HD on TTS. Per nephology.    For questions or updates, please contact [place] Please consult [email or web address] for contact info under Cardiology/STEMI.      Signed, Tami Lin Duke, PA  07/19/2017, 11:22 AM

## 2017-07-19 NOTE — Care Management Note (Signed)
Case Management Note  Patient Details  Name: Douglas Meyer MRN: [id number] Date of Birth: September 10, 1940  Subjective/Objective:   From home with wife, per previous NCM note , for rolling walker, patient chose [facility].  The [facility] agency list was left with patient to choose an agency for HHPT/HHOT, but he has not chose one yet.  Patient is s/p heart cath 2/11-  3 vessel dz-  He was deemed high risk for CABG and does not have suitable targets for grafting. The case was discussed with Dr. Burt Knack who will evaluate him for possible LM PCI  Per MD note.   2/13 [place], BSN - NCM offered choice to patient for HHPT/HHOT, he chose [facility], he states he does not need HHRN or a walker, he states he was fine when he ambulated this am with cardiac rehab and he has a quad cane at home.  NCM made referral to Butch Penny with [facility] for HHPT/HHOT.  Soc will begin 24-48 hrs post dc.                     Action/Plan: DC home with HHPT/HHOT with [facility].   Expected Discharge Date:                  Expected Discharge Plan:  [place]  In-House Referral:     Discharge planning Services  CM Consult  Post Acute Care Choice:  Durable Medical Equipment Choice offered to:  Patient  DME Arranged:    DME Agency:     HH Arranged:  PT, OT HH Agency:  [place]  Status of Service:  Completed, signed off  If discussed at [place] of Stay Meetings, dates discussed:    Additional Comments:  Zenon Mayo, RN 07/19/2017, 10:03 AM

## 2017-07-19 NOTE — Progress Notes (Signed)
Patient ID: Douglas Meyer, male   DOB: 06-Dec-1940, 77 y.o.   MRN: [id number]  [facility] Progress Note   Assessment/ Plan:   1. End-stage renal disease: with progressive chronic kidney disease and admitted for initiation of hemodialysis because of multiple uremic symptoms, underwent hemodialysis early Saturday morning/late Friday night. Process underway for outpatient dialysis unit placement with his ultimate goal being transitioning to peritoneal dialysis. -HD TTS  2. Anemia: Iron deficiency noted with low iron saturation/permissive ferritin. S/p IV iron.  3. Coronary artery disease - cath with diffuse disease, seen by CVTS, not a candidate for CABG. Medical management vs stent.  -cards outpatient follow up  4. Secondary hyperparathyroidism: on calcitriol for PTH suppression, and started on calcium acetate for phosphorus binding 5. Nutrition: on Nepro supplementation, renal vitamin.  Subjective:   Patient feels well this am, would like to go home. Has ride to HD, recalls time.   Objective:   BP (!) 108/51   Pulse 76   Temp 98.2 F (36.8 C) (Oral)   Resp (!) 21   Ht 5\' 6"  (1.676 m)   Wt 147 lb 11.3 oz (67 kg)   SpO2 93%   BMI 23.84 kg/m   Physical Exam: Gen: elderly male resting in bed in NAD CV: RRR, no murmur Resp: CTAB, easy WOB Abd: SNTND, +BS Ext: no edema  Labs: BMET Recent Labs  Lab 07/13/17 0332 07/14/17 0546 07/15/17 0215 07/16/17 0653 07/17/17 0710 07/18/17 0523 07/19/17 0314  NA 134* 136 134* 141 137 136 136  K 4.3 4.0 4.1 4.2 4.2 4.6 4.1  CL 97* 98* 98* 100* 96* 95* 95*  CO2 16* 21* 18* 23 21* 22 24  GLUCOSE 96 110* 164* 105* 128* 165* 101*  BUN 172* 107* 117* 63* 29* 48* 34*  CREATININE 10.03* 7.72* 7.96* 6.21* 4.49* 6.13* 5.04*  CALCIUM 9.0 8.7* 8.7* 9.1 9.0 9.2 9.0  PHOS  --  6.3* 6.6* 6.5* 4.9* 7.7* 5.7*   CBC Recent Labs  Lab 07/12/17 0949  07/15/17 0742 07/16/17 0653 07/17/17 0455 07/18/17 0523  WBC 9.9   < > 8.1 7.9  9.1 8.7  NEUTROABS 7.6  --   --   --   --   --   HGB 10.0*   < > 8.8* 9.0* 9.6* 9.5*  HCT 30.1*   < > 27.2* 28.3* 30.4* 29.8*  MCV 95.2   < > 94.1 96.6 96.8 96.8  PLT 224.0   < > 198 193 198 184   < > = values in this interval not displayed.   Medications:    . aspirin  81 mg Oral Daily  . atorvastatin  20 mg Oral q1800  . calcitRIOL  0.5 mcg Oral QODAY  . calcium acetate  667 mg Oral TID WC  . clopidogrel  75 mg Oral Daily  . feeding supplement (NEPRO CARB STEADY)  237 mL Oral TID BM  . insulin aspart  0-9 Units Subcutaneous TID WC  . insulin glargine  5 Units Subcutaneous Daily  . metoprolol  75 mg Oral QHS  . multivitamin  1 tablet Oral QHS  . omega-3 acid ethyl esters  1 g Oral QHS  . sodium chloride flush  3 mL Intravenous Q12H   Ralene Ok, MD 07/19/17  I have seen and examined this patient and agree with plan and assessment in the above note with renal recommendations/intervention highlighted.  Appears that he will have cardiac procedure at a later date so stable for discharge  from our standpoint and he can follow up at [facility] tomorrow for outpatient dialysis. [name],MD 07/19/2017 1:18 PM

## 2017-07-20 ENCOUNTER — Telehealth: Payer: Self-pay

## 2017-07-20 NOTE — Telephone Encounter (Signed)
Left message to call to schedule appointment with Dr. Burt Knack.

## 2017-07-20 NOTE — Telephone Encounter (Signed)
-----   Message from Sherren Mocha, MD sent at 07/18/2017  6:38 PM EST ----- Valetta Fuller: can you set him up to see me in office 3-4 weeks? thanks

## 2017-07-24 ENCOUNTER — Telehealth: Payer: Self-pay

## 2017-07-24 ENCOUNTER — Telehealth: Payer: Self-pay | Admitting: Family Medicine

## 2017-07-24 NOTE — Telephone Encounter (Signed)
Spoke with pt, advised per Dr Sherren Mocha to follow up with his Nephrology and cardiologist. Pt voiced understanding that he does not need to schedule an appointment with Dr Sherren Mocha since from point of view he is stable and does not need to see Dr Sherren Mocha.

## 2017-07-24 NOTE — Telephone Encounter (Signed)
Please Advise

## 2017-07-24 NOTE — Telephone Encounter (Signed)
Copied from [place]. Topic: Quick Communication - See Telephone Encounter >> Jul 24, 2017  4:39 PM Bea Graff, NT wrote: CRM for notification. See Telephone encounter for: Shaunda with [facility] needing verbal orders for PT for this pt with a duration of 2 times a week for 3 weeks and once a week for 2 weeks. CB#: [phone]  07/24/17.

## 2017-07-24 NOTE — Telephone Encounter (Signed)
Copied from [place] [id number]. Topic: Quick Communication - See Telephone Encounter >> Jul 24, 2017  2:47 PM Vernona Rieger wrote: CRM for notification. See Telephone encounter for:   07/24/17.  Havery Moros from [facility] said that the Pt has recently been in the hospital. He came home with [facility] with physical therapy. He has so many medications, she wants to know if he can have a skilled nursing for medication review & medication education. Call back is [phone], ask for [facility].

## 2017-07-25 ENCOUNTER — Inpatient Hospital Stay: Payer: Medicare Other | Admitting: Family Medicine

## 2017-07-25 NOTE — Telephone Encounter (Signed)
Left message to call back  

## 2017-07-25 NOTE — Telephone Encounter (Signed)
Okay for home health consult to review multiple medications. He is on dialysis now. Any questions about his medication should be deferred directly to his nephrologist

## 2017-07-26 NOTE — Telephone Encounter (Signed)
Spoke with Douglas Meyer from [place] gave verbal orders for PT on Douglas Meyer per Dr Sherren Mocha.

## 2017-07-26 NOTE — Telephone Encounter (Signed)
Spoke with Shelby Dubin from [facility] gave verbal orders for PT on Mr Mangino per Dr Sherren Mocha.

## 2017-07-28 ENCOUNTER — Ambulatory Visit: Payer: Medicare Other | Admitting: Physician Assistant

## 2017-07-28 ENCOUNTER — Encounter: Payer: Self-pay | Admitting: Physician Assistant

## 2017-07-28 VITALS — BP 120/60 | HR 94 | Ht 65.5 in | Wt 148.0 lb

## 2017-07-28 DIAGNOSIS — I255 Ischemic cardiomyopathy: Secondary | ICD-10-CM

## 2017-07-28 DIAGNOSIS — E785 Hyperlipidemia, unspecified: Secondary | ICD-10-CM

## 2017-07-28 DIAGNOSIS — I214 Non-ST elevation (NSTEMI) myocardial infarction: Secondary | ICD-10-CM | POA: Diagnosis not present

## 2017-07-28 DIAGNOSIS — I10 Essential (primary) hypertension: Secondary | ICD-10-CM

## 2017-07-28 DIAGNOSIS — N186 End stage renal disease: Secondary | ICD-10-CM | POA: Diagnosis not present

## 2017-07-28 MED ORDER — NITROGLYCERIN 0.4 MG SL SUBL: 0.4000 mg | SUBLINGUAL_TABLET | SUBLINGUAL | 3 refills | Status: AC | PRN

## 2017-07-28 MED ORDER — METOPROLOL SUCCINATE ER 25 MG PO TB24: ORAL_TABLET | ORAL | 2 refills | Status: DC

## 2017-07-28 NOTE — Telephone Encounter (Signed)
Scheduled patient 3/6 with Dr. Burt Knack. Gave patient office address and directions. He was grateful for call and agrees with treatment plan.

## 2017-07-28 NOTE — Patient Instructions (Signed)
Medication Instructions:  INCREASE Toprol XL 25 mg to twice a day on NON-DIALYSIS DAYS (MON/WED/FRI/SUN) TAKE Nitro as needed for emergency chest pain  Labwork: None   Testing/Procedures: None   Follow-Up: Your physician recommends that you schedule a follow-up appointment in: [place]  Any Other Special Instructions Will Be Listed Below (If Applicable).  You have been referred to [facility]    If you need a refill on your cardiac medications before your next appointment, please call your pharmacy.

## 2017-07-28 NOTE — Progress Notes (Signed)
Cardiology Office Note   Date:  07/28/2017   ID:  Douglas Meyer, DOB 14-Sep-1940, MRN [id number]  PCP:  Dorena Cookey, MD  Cardiologist:  Dr Percival Spanish, 02/17/2017, Dr Burt Knack 07/18/2016  Rosaria Ferries, PA-C   Chief Complaint  Patient presents with  . Follow-up    History of Present Illness: Douglas Meyer is a 77 y.o. male with a history of DM, HTN, HLD, PAD, CKD V (recently started on HD), gout, CAD s/p OOH MI 1990s, anemia s  Admitted 2/7-2/13/2019 for possible AK I, non-STEMI by enzymes, EF 25%, cath with three-vessel disease>>med mgt for now till he is stable on HD, then possible hemodynamically supported atherectomy and stenting of the left mainstem per Dr Burt Knack, on Plavix and ASA 81 mg   Douglas Meyer presents for cardiology follow up.  He has not had any CP since dc. HD on Tu/Th/Sat. This is going well, he is not gaining excess fluid between treatments, he is not having CP during sessions. He is tired from the HD.  He reports that his systolic blood pressures approximately 100 towards the end of dialysis.  He does not have any sx he attributes to coronary artery disease.  He is generally tired, and does get dyspnea on exertion.  It is hard for him to say what might be anginal symptoms, he never had isolated chest pain.  Prior to admission, he had never been diagnosed with coronary artery disease and he felt terrible in many ways.  Therefore, it is impossible for him to say exactly which of his symptoms might have been from coronary artery disease.  Since discharge, no chest pain.  His dyspnea on exertion has been stable.  He has no palpitations, never feels his heart skip or race.  He is not having claudication symptoms.  He is not getting lightheaded or dizzy when he stands up or gets up out of bed.  The nephrologist cut the Toprol-XL from 75 mg daily back to 25 mg daily at bedtime.  His heart rate is currently in the 90s.  He is interested in increasing his activity  and wonders if it is okay to do this.   Past Medical History:  Diagnosis Date  . Bilateral carotid artery disease ([place])   . CAD (coronary artery disease) 05/15/10   out of hospital myocardial infarction in the 1990's. this was picked up on an  EKG in 1997.  Cardiac catherterization demonstrated an occluded vessel and collateras.  I have no description of this.  A stress  perfusion study  done 2008 demonstrated an ejection fraction  of  53% with inferolateral  hypokinesis.  There was an inferolateral defect with  scar and some mild mixed ischemia.   managed rx  . Cancer ([place])    skin  . CKD (chronic kidney disease), stage IV ([place])   . Diabetes mellitus    DM, type II  . ED (erectile dysfunction)   . Gout   . History of kidney stones   . Hx of cardiovascular stress test    a. Lexiscan myoview 10/18/12: EF 53%, inf and inf-lat scar, slight peri-infarct ischemia, small area of apical ischemia; no change from 2006  . Hyperlipidemia   . Hypertension   . Myocardial infarction ([place])   . Nephrolithiasis   . Polyp of colon   . PVD (peripheral vascular disease) ([place])     Past Surgical History:  Procedure Laterality Date  . AV FISTULA PLACEMENT Right 06/19/2015   Procedure: ARTERIOVENOUS (  AV) FISTULA CREATION;  Surgeon: Serafina Mitchell, MD;  Location: [place];  Service: Vascular;  Laterality: Right;  . BASCILIC VEIN TRANSPOSITION Left 08/11/2016   Procedure: BASCILIC VEIN TRANSPOSITION-LEFT 1ST STAGE;  Surgeon: Serafina Mitchell, MD;  Location: [place];  Service: Vascular;  Laterality: Left;  . BASCILIC VEIN TRANSPOSITION Left 10/21/2016   Procedure: LEFT ARM 2ND STAGE BASILIC VEIN TRANSPOSITION;  Surgeon: Serafina Mitchell, MD;  Location: [facility];  Service: Vascular;  Laterality: Left;  . COLONOSCOPY W/ POLYPECTOMY    . CYSTOSCOPY     stone removal x 1  . ENDARTERECTOMY Right 11/28/2014   Procedure: RIGHT CAROTID ENDARTERECTOMY;  Surgeon: Serafina Mitchell, MD;  Location: [facility] [place];  Service: Vascular;   Laterality: Right;  . EYE SURGERY  06/2013   bilateral cataract surgery  . HUMERUS FRACTURE SURGERY     left  . MOHS SURGERY     multiple  . PATCH ANGIOPLASTY Right 11/28/2014   Procedure: PATCH ANGIOPLASTY RIGHT CAROTID;  Surgeon: Serafina Mitchell, MD;  Location: [place];  Service: Vascular;  Laterality: Right;  . RIGHT/LEFT HEART CATH AND CORONARY ANGIOGRAPHY N/A 07/17/2017   Procedure: RIGHT/LEFT HEART CATH AND CORONARY ANGIOGRAPHY;  Surgeon: Jettie Booze, MD;  Location: [place] CV LAB;  Service: Cardiovascular;  Laterality: N/A;    Current Outpatient Medications  Medication Sig Dispense Refill  . allopurinol (ZYLOPRIM) 300 MG tablet TAKE ONE TABLET BY MOUTH ONCE DAILY 90 tablet 3  . Ascorbic Acid (VITAMIN C) 500 MG tablet Take 500 mg by mouth at bedtime.     Marland Kitchen aspirin 81 MG chewable tablet Chew 1 tablet (81 mg total) by mouth daily. 30 tablet 0  . atorvastatin (LIPITOR) 20 MG tablet TAKE ONE TABLET BY MOUTH ONCE DAILY AT  6  PM 90 tablet 3  . BD PEN NEEDLE NANO U/F 32G X 4 MM MISC  USE AS DIRECTED DAILY 100 each 0  . calcium acetate (PHOSLO) 667 MG capsule Take 1 capsule (667 mg total) by mouth 3 (three) times daily with meals. 90 capsule 0  . clopidogrel (PLAVIX) 75 MG tablet Take 1 tablet (75 mg total) by mouth daily. 30 tablet 0  . Insulin Glargine (LANTUS SOLOSTAR) 100 UNIT/ML Solostar Pen INJECT 26 UNITS SUBCUTANEOUSLY AT BEDTIME (Patient taking differently: Inject 10 Units into the skin daily. ) 3 pen 5  . Lancets (ACCU-CHEK MULTICLIX) lancets 1 each by Other route daily. Dx 250.01 102 each 3  . metoprolol succinate (TOPROL-XL) 25 MG 24 hr tablet Take 25 mg by mouth at bedtime.    . Multiple Vitamins-Minerals (MULTIVITAMIN WITH MINERALS) tablet Take 1 tablet by mouth at bedtime.     . Nutritional Supplements (FEEDING SUPPLEMENT, NEPRO CARB STEADY,) LIQD Take 237 mLs by mouth 3 (three) times daily between meals. 10 Can 0  . Omega-3 Fatty Acids (FISH OIL) 500 MG CAPS Take 500  mg by mouth at bedtime.      No current facility-administered medications for this visit.     Allergies:   Patient has no known allergies.    Social History:  The patient  reports that he quit smoking about 44 years ago. He quit after 6.00 years of use. he has never used smokeless tobacco. He reports that he drinks about 4.2 oz of alcohol per week. He reports that he does not use drugs.   Family History:  The patient's family history includes Diabetes in his brother and sister; Diabetes type II in his brother  and brother; Heart attack in his father and mother.    ROS:  Please see the history of present illness. All other systems are reviewed and negative.    PHYSICAL EXAM: VS:  BP 120/60   Pulse 94   Ht 5' 5.5" (1.664 m)   Wt 148 lb (67.1 kg)   BMI 24.25 kg/m  , BMI Body mass index is 24.25 kg/m. GEN: Well nourished, well developed, male in no acute distress  HEENT: normal for age  Neck: JVD 8-9 cm, no carotid bruit, no masses Cardiac: RRR; 2/6 murmur, no rubs, or gallops Respiratory: decreased BS bases w/ few rales bilaterally, normal work of breathing GI: soft, nontender, nondistended, + BS MS: no deformity or atrophy; no edema; distal pulses are 2+ in RUE, fistula with palpable thrill in the left upper extremity, decreased in both lower extremities Skin: warm and dry, no rash Neuro:  Strength and sensation are intact Psych: euthymic mood, full affect   EKG:  EKG is not ordered today.  CARDIAC CATH: 07/17/2017 Diagnostic  Dominance: Right  Left Main  Ost LM to Dist LM lesion 70% stenosed  Ost LM to Dist LM lesion is 70% stenosed.  Left Anterior Descending  Mid LAD lesion 75% stenosed  Mid LAD lesion is 75% stenosed.  First Diagonal Branch  1st Diag lesion 75% stenosed  1st Diag lesion is 75% stenosed.  Left Circumflex  Prox Cx lesion 25% stenosed  Prox Cx lesion is 25% stenosed.  Right Coronary Artery  Prox RCA to Mid RCA lesion 100% stenosed  Prox RCA to Mid  RCA lesion is 100% stenosed.  Acute Marginal Branch  Collaterals  Acute Mrg filled by collaterals from Prox RCA.    Right Posterior Atrioventricular Branch  Collaterals  Post Atrio filled by collaterals from 2nd Sept.    Intervention   No interventions have been documented.  Right Heart   Right Heart Pressures Hemodynamic findings consistent with mild pulmonary hypertension. Elevated LV EDP consistent with volume overload.  Left Heart   Left Ventricle LV end diastolic pressure is moderately elevated.  Aortic Valve There is no aortic valve stenosis.  Coronary Diagrams   Diagnostic Diagram        Hemo Data   Most Recent Value  Fick Cardiac Output 5.72 L/min  Fick Cardiac Output Index 3.29 (L/min)/BSA  RA A Wave 17 mmHg  RA V Wave 14 mmHg  RA Mean 14 mmHg  RV Systolic Pressure 49 mmHg  RV Diastolic Pressure 7 mmHg  RV EDP 16 mmHg  PA Systolic Pressure 47 mmHg  PA Diastolic Pressure 21 mmHg  PA Mean 32 mmHg  PW A Wave 24 mmHg  PW V Wave 32 mmHg  PW Mean 22 mmHg  AO Systolic Pressure 96 mmHg  AO Diastolic Pressure 50 mmHg  AO Mean 68 mmHg  LV Systolic Pressure 88 mmHg  LV Diastolic Pressure 3 mmHg  LV EDP 26 mmHg  Arterial Occlusion Pressure Extended Systolic Pressure 88 mmHg  Arterial Occlusion Pressure Extended Diastolic Pressure 39 mmHg  Arterial Occlusion Pressure Extended Mean Pressure 59 mmHg  Left Ventricular Apex Extended Systolic Pressure 88 mmHg  Left Ventricular Apex Extended Diastolic Pressure 6 mmHg  Left Ventricular Apex Extended EDP Pressure 27 mmHg  QP/QS 1  TPVR Index 9.74 HRUI  TSVR Index 20.7 HRUI  PVR SVR Ratio 0.19  TPVR/TSVR Ratio 0.47   2D echocardiogram: 07/16/2017 Study Conclusions - Left ventricle: Diffuse hypokinesis worse in the mid and basal inferior wall. The  cavity size was moderately dilated. Wall thickness was normal. The estimated ejection fraction was 25%. Doppler parameters are consistent with both  elevated ventricular end-diastolic filling pressure and elevated left atrial filling pressure. - Mitral valve: There was mild regurgitation. - Left atrium: The atrium was moderately dilated. - Right ventricle: The cavity size was mildly dilated. - Right atrium: The atrium was mildly dilated. - Atrial septum: No defect or patent foramen ovale was identified. - Tricuspid valve: There was moderate regurgitation. - Pulmonary arteries: PA peak pressure: 38 mm Hg (S).   Recent Labs: 07/12/2017: TSH 4.66 07/18/2017: Hemoglobin 9.5; Platelets 184 07/19/2017: BUN 34; Creatinine, Ser 5.04; Potassium 4.1; Sodium 136    Lipid Panel    Component Value Date/Time   CHOL 87 07/16/2017 0653   TRIG 78 07/16/2017 0653   HDL 33 (L) 07/16/2017 0653   CHOLHDL 2.6 07/16/2017 0653   VLDL 16 07/16/2017 0653   LDLCALC 38 07/16/2017 0653   LDLDIRECT 55.0 04/19/2016 0935     Wt Readings from Last 3 Encounters:  07/28/17 148 lb (67.1 kg)  07/19/17 147 lb 11.3 oz (67 kg)  07/12/17 157 lb (71.2 kg)     Other studies Reviewed: Additional studies/ records that were reviewed today include: Hospital records and testing.  ASSESSMENT AND PLAN:  1. NSTEMI: He is on aspirin, statin, Plavix and beta blocker.  No ACE or ARB due to renal failure.  We will refer him to cardiac rehab.   Because his heart rate is elevated, I would like to increase the beta-blocker.  However, I do not think he can tolerate a higher dose of beta-blocker on dialysis days.  Therefore, I will ask him to take the Toprol-XL 25 mg twice daily on nondialysis days which is 4 days a week.  Continue taking it in the evening only on dialysis days.    He does not have sublingual nitroglycerin, we will give him a prescription.  If he gets chest pain that he thinks is from his heart, he is encouraged to use it.  He has not had any so far.  2.  ESRD on HD: He is encouraged to continue dialysis appointments and monitor his fluid intake carefully in  between.  He is encouraged to stick tightly to a renal diabetic diet.  3.  Dyslipidemia: Continue Lipitor at 20 mg a day.  4.  Ischemic cardiomyopathy: Continue beta-blocker as tolerated.  No ACE or ARB because of renal failure.  Currently not a candidate for [facility] because of dialysis.  Recheck echo in 3 months.  If EF is not improved, consider discussing an ICD.  He is not interested in a LifeVest.  5.  Hypertension: His blood pressures well controlled on current medications, he does not report any recent readings below 100.  Continue current therapy.  Current medicines are reviewed at length with the patient today.  The patient has concerns regarding medicines.  Concerns were addressed  The following changes have been made: Increase metoprolol to 25 mg twice daily on nondialysis days  Labs/ tests ordered today include:  No orders of the defined types were placed in this encounter.    Disposition:   FU with Dr. Percival Spanish and Dr. Burt Knack  Signed, Rosaria Ferries, PA-C  07/28/2017 9:19 AM    [place] Phone: [phone]; Fax: [phone]  This note was written with the assistance of speech recognition software. Please excuse any transcriptional errors.

## 2017-07-28 NOTE — Telephone Encounter (Signed)
New message   Returning call for nurse

## 2017-07-28 NOTE — Telephone Encounter (Signed)
Left message to call back to arrange OV with Dr. Burt Knack.

## 2017-07-31 ENCOUNTER — Telehealth (HOSPITAL_COMMUNITY): Payer: Self-pay | Admitting: *Deleted

## 2017-07-31 NOTE — Telephone Encounter (Signed)
Called pt for update on cardiac rehab referral.  Cardiac rehab is contraindicated with pt with Left main disease.  Pt has upcoming appt on 3/6 with Burt Knack for possible interncention.  Message left for pt to please contact. Cherre Huger, BSN Cardiac and Training and development officer

## 2017-08-01 ENCOUNTER — Telehealth (HOSPITAL_COMMUNITY): Payer: Self-pay

## 2017-08-01 NOTE — Telephone Encounter (Signed)
Wife of patient called and left message - attempted to return phone call twice and phone kept ringing.

## 2017-08-07 ENCOUNTER — Ambulatory Visit: Payer: Self-pay

## 2017-08-07 NOTE — Telephone Encounter (Signed)
Returned call to pt. to discuss his constipation.  Reported he had not had a BM for approx. 1 week.  Reported he took a Dulcolax tablet and was able to expel 2 small stools this AM.  Reported he started hemodialysis approx. 2 weeks ago, and had been in the hospital.  Stated he is on a 4 cup/ day fluid restriction.  Denied any abdominal pain, nausea, vomiting, or fever. Advised with starting HD, having decreased mobility, and decreased fluid intake, that all may have contributed his constipation.  Advised against taking MOM. Advised him to discuss with nurse at the [facility], which laxatives are approved to take for constipation, for future use.   Pt. verb. understanding, and stated he will discuss with his nurse at Dialysis tomorrow.               Reason for Disposition . Treating constipation with Over-The-Counter (OTC) medicines, questions about  Answer Assessment - Initial Assessment Questions 1. STOOL PATTERN OR FREQUENCY: "How often do you pass bowel movements (BMs)?"  (Normal range: tid to q 3 days)  "When was the last BM passed?"       Usually goes every other day.  2. STRAINING: "Do you have to strain to have a BM?"     Straining 3. RECTAL PAIN: "Does your rectum hurt when the stool comes out?" If so, ask: "Do you have hemorrhoids? How bad is the pain?"  (Scale 1-10; or mild, moderate, severe)     no 4. STOOL COMPOSITION: "Are the stools hard?"      Very small stools today, after taking Dulcolax tablet today 5. BLOOD ON STOOLS: "Has there been any blood on the toilet tissue or on the surface of the BM?" If so, ask: "When was the last time?"      Saw very small amt. Blood on toilet tissue after BM today.  6. CHRONIC CONSTIPATION: "Is this a new problem for you?"  If no, ask: How long have you had this problem?" (days, weeks, months)      No  7. CHANGES IN DIET: "Have there been any recent changes in your diet?"      Renal diet for past 2 weeks.  8. MEDICATIONS: "Have you been taking  any new medications?"     Taking vitamin C 9. LAXATIVES: "Have you been using any laxatives or enemas?"  If yes, ask "What, how often, and when was the last time?"     Dulcolax 10. CAUSE: "What do you think is causing the constipation?"       Started Dialysis about 2 weeks ago  11. OTHER SYMPTOMS: "Do you have any other symptoms?" (e.g., abdominal pain, fever, vomiting)       Denied abd. Pain, n/v, or fever 12. PREGNANCY: "Is there any chance you are pregnant?" "When was your last menstrual period?"       n/a  Protocols used: CONSTIPATION-A-AH

## 2017-08-09 ENCOUNTER — Encounter: Payer: Self-pay | Admitting: Cardiovascular Disease

## 2017-08-09 ENCOUNTER — Ambulatory Visit (INDEPENDENT_AMBULATORY_CARE_PROVIDER_SITE_OTHER): Payer: Medicare Other | Admitting: Cardiovascular Disease

## 2017-08-09 VITALS — BP 104/50 | HR 105 | Ht 65.5 in | Wt 148.0 lb

## 2017-08-09 DIAGNOSIS — I5022 Chronic systolic (congestive) heart failure: Secondary | ICD-10-CM | POA: Diagnosis not present

## 2017-08-09 DIAGNOSIS — I255 Ischemic cardiomyopathy: Secondary | ICD-10-CM

## 2017-08-09 NOTE — Progress Notes (Signed)
Cardiology Office Note Date:  08/09/2017   ID:  Douglas Meyer, DOB 1940/07/05, MRN [id number]  PCP:  Dorena Cookey, MD  Cardiologist:  Sherren Mocha, MD    No chief complaint on file.    History of Present Illness: Douglas Meyer is a 77 y.o. male who presents for hospital follow-up evaluation.  The patient was recently hospitalized with uremic symptoms and progressive now end-stage renal disease.  During that assessment cardiac enzymes were found to be markedly elevated even though the patient had no chest pain symptoms.  He underwent catheterization demonstrating severe multivessel coronary artery disease including severe left main stenosis and total occlusion of the native RCA.  He is managed with aspirin and clopidogrel.  He was also found to have severe LV systolic dysfunction with an ejection fraction of 25%.  He was evaluate hospital felt to be a poor candidate for cardiac surgery or PCI.  He presents today with his wife for hospital follow-up.  The patient is doing okay.  He has no chest pain, chest pressure, or shortness of breath.  He complains of feeling weak and tired.  His blood pressure has been running low with dialysis.  He has no orthopnea, PND, or syncope.   Past Medical History:  Diagnosis Date  . Bilateral carotid artery disease ([place])   . CAD (coronary artery disease) 05/15/10   out of hospital myocardial infarction in the 1990's. this was picked up on an  EKG in 1997.  Cardiac catherterization demonstrated an occluded vessel and collateras.  I have no description of this.  A stress  perfusion study  done 2008 demonstrated an ejection fraction  of  53% with inferolateral  hypokinesis.  There was an inferolateral defect with  scar and some mild mixed ischemia.   managed rx  . Cancer ([place])    skin  . CKD (chronic kidney disease), stage IV ([place])   . Diabetes mellitus    DM, type II  . ED (erectile dysfunction)   . Gout   . History of kidney stones   . Hx of  cardiovascular stress test    a. Lexiscan myoview 10/18/12: EF 53%, inf and inf-lat scar, slight peri-infarct ischemia, small area of apical ischemia; no change from 2006  . Hyperlipidemia   . Hypertension   . Myocardial infarction ([place])   . Nephrolithiasis   . Polyp of colon   . PVD (peripheral vascular disease) ([place])     Past Surgical History:  Procedure Laterality Date  . AV FISTULA PLACEMENT Right 06/19/2015   Procedure: ARTERIOVENOUS (AV) FISTULA CREATION;  Surgeon: Serafina Mitchell, MD;  Location: [facility];  Service: Vascular;  Laterality: Right;  . BASCILIC VEIN TRANSPOSITION Left 08/11/2016   Procedure: BASCILIC VEIN TRANSPOSITION-LEFT 1ST STAGE;  Surgeon: Serafina Mitchell, MD;  Location: [facility];  Service: Vascular;  Laterality: Left;  . BASCILIC VEIN TRANSPOSITION Left 10/21/2016   Procedure: LEFT ARM 2ND STAGE BASILIC VEIN TRANSPOSITION;  Surgeon: Serafina Mitchell, MD;  Location: [facility];  Service: Vascular;  Laterality: Left;  . COLONOSCOPY W/ POLYPECTOMY    . CYSTOSCOPY     stone removal x 1  . ENDARTERECTOMY Right 11/28/2014   Procedure: RIGHT CAROTID ENDARTERECTOMY;  Surgeon: Serafina Mitchell, MD;  Location: [facility];  Service: Vascular;  Laterality: Right;  . EYE SURGERY  06/2013   bilateral cataract surgery  . HUMERUS FRACTURE SURGERY     left  . MOHS SURGERY     multiple  .  PATCH ANGIOPLASTY Right 11/28/2014   Procedure: PATCH ANGIOPLASTY RIGHT CAROTID;  Surgeon: Serafina Mitchell, MD;  Location: [facility];  Service: Vascular;  Laterality: Right;  . RIGHT/LEFT HEART CATH AND CORONARY ANGIOGRAPHY N/A 07/17/2017   Procedure: RIGHT/LEFT HEART CATH AND CORONARY ANGIOGRAPHY;  Surgeon: Jettie Booze, MD;  Location: [facility];  Service: Cardiovascular;  Laterality: N/A;    Current Outpatient Medications  Medication Sig Dispense Refill  . allopurinol (ZYLOPRIM) 300 MG tablet TAKE ONE TABLET BY MOUTH ONCE DAILY 90 tablet 3  . Ascorbic Acid (VITAMIN C) 500 MG tablet Take 500 mg by  mouth at bedtime.     Marland Kitchen aspirin 81 MG chewable tablet Chew 1 tablet (81 mg total) by mouth daily. 30 tablet 0  . atorvastatin (LIPITOR) 20 MG tablet TAKE ONE TABLET BY MOUTH ONCE DAILY AT  6  PM 90 tablet 3  . BD PEN NEEDLE NANO U/F 32G X 4 MM MISC  USE AS DIRECTED DAILY 100 each 0  . calcium acetate (PHOSLO) 667 MG capsule Take 1 capsule (667 mg total) by mouth 3 (three) times daily with meals. 90 capsule 0  . clopidogrel (PLAVIX) 75 MG tablet Take 1 tablet (75 mg total) by mouth daily. 30 tablet 0  . insulin glargine (LANTUS) 100 unit/mL SOPN Inject 16 Units into the skin at bedtime.    . Lancets (ACCU-CHEK MULTICLIX) lancets 1 each by Other route daily. Dx 250.01 102 each 3  . metoprolol succinate (TOPROL-XL) 25 MG 24 hr tablet Take 25 mg by mouth as directed. 1 tablet twice daily on NON Dialysis days M. W. F. Sun 1 tablet ONCE daily on all other days (T, TH, Sat)    . Multiple Vitamins-Minerals (MULTIVITAMIN WITH MINERALS) tablet Take 1 tablet by mouth at bedtime.     . nitroGLYCERIN (NITROSTAT) 0.4 MG SL tablet Place 1 tablet (0.4 mg total) under the tongue every 5 (five) minutes as needed for up to 25 days for chest pain. 25 tablet 3  . Nutritional Supplements (FEEDING SUPPLEMENT, NEPRO CARB STEADY,) LIQD Take 237 mLs by mouth 3 (three) times daily between meals. 10 Can 0  . Omega-3 Fatty Acids (FISH OIL) 500 MG CAPS Take 500 mg by mouth at bedtime.      No current facility-administered medications for this visit.     Allergies:   Patient has no known allergies.   Social History:  The patient  reports that he quit smoking about 44 years ago. He quit after 6.00 years of use. he has never used smokeless tobacco. He reports that he drinks about 4.2 oz of alcohol per week. He reports that he does not use drugs.   Family History:  The patient's family history includes Diabetes in his brother and sister; Diabetes type II in his brother and brother; Heart attack in his father and mother.     ROS:  Please see the history of present illness.  Otherwise, review of systems is positive for poor appetite, constipation.  All other systems are reviewed and negative.    PHYSICAL EXAM: VS:  BP (!) 104/50   Pulse (!) 105   Ht 5' 5.5" (1.664 m)   Wt 148 lb (67.1 kg)   BMI 24.25 kg/m  , BMI Body mass index is 24.25 kg/m. GEN: Elderly male, in no acute distress  HEENT: normal  Neck: no JVD, no masses.  Cardiac: RRR without murmur or gallop  Respiratory:  clear to auscultation bilaterally, normal work of breathing GI: soft, nontender, nondistended, + BS MS: no deformity or atrophy  Ext: no pretibial edema Skin: warm and dry, no rash Neuro:  Strength and sensation are intact Psych: euthymic mood, full affect  EKG:  EKG is not ordered today.  Recent Labs: 07/12/2017: TSH 4.66 07/18/2017: Hemoglobin 9.5; Platelets 184 07/19/2017: BUN 34; Creatinine, Ser 5.04; Potassium 4.1; Sodium 136   Lipid Panel     Component Value Date/Time   CHOL 87 07/16/2017 0653   TRIG 78 07/16/2017 0653   HDL 33 (L) 07/16/2017 0653   CHOLHDL 2.6 07/16/2017 0653   VLDL 16 07/16/2017 0653   LDLCALC 38 07/16/2017 0653   LDLDIRECT 55.0 04/19/2016 0935      Wt Readings from Last 3 Encounters:  08/09/17 148 lb (67.1 kg)  07/28/17 148 lb (67.1 kg)  07/19/17 147 lb 11.3 oz (67 kg)     Cardiac Studies Reviewed: Echo 07-16-2017: Study Conclusions  - Left ventricle: Diffuse hypokinesis worse in the mid and basal   inferior wall. The cavity size was moderately dilated. Wall   thickness was normal. The estimated ejection fraction was 25%.   Doppler parameters are consistent with both elevated ventricular   end-diastolic filling pressure and elevated left atrial filling   pressure. - Mitral valve: There was mild regurgitation. - Left atrium: The atrium was moderately dilated. - Right ventricle: The cavity size was mildly dilated. - Right atrium: The atrium was mildly dilated. -  Atrial septum: No defect or patent foramen ovale was identified. - Tricuspid valve: There was moderate regurgitation. - Pulmonary arteries: PA peak pressure: 38 mm Hg (S).  Cath 07-17-2017: Conclusion     Prox RCA to Mid RCA lesion is 100% stenosed.  Ost LM to Dist LM lesion is 70% stenosed.  Prox Cx lesion is 25% stenosed.  Mid LAD lesion is 75% stenosed.  1st Diag lesion is 75% stenosed.  LV end diastolic pressure is moderately elevated, 26 mm Hg.  There is no aortic valve stenosis.  Hemodynamic findings consistent with mild pulmonary hypertension.  CO 5.7 L/min; CI 3.2; Ao sat 94%; PA sat 63%; mean PCWP 22 mm Hg   Severe three vessel CAD involving left main, LAD and RCA.   Plan for cardiac surgery consultation.  Restart heparin 8 hours post sheath pull.   Indications   NSTEMI (non-ST elevated myocardial infarction) ([place]) [I21.4 (ICD-10-CM)]  Procedural Details/Technique   Technical Details The risks, benefits, and details of the procedure were explained to the patient. The patient verbalized understanding and wanted to proceed. Informed written consent was obtained.  PROCEDURE TECHNIQUE: After Xylocaine anesthesia, a 7 French sheath was placed in the right common femoral vein. A 7 French balloontipped Swan-Ganz catheter was advanced to the pulmonary artery under fluoroscopic guidance. Hemodynamic pressures were obtained. Oxygen saturations were obtained. After Xylocaine anesthesia, a 5F sheath was placed in the right femoral artery with a single anterior needle wall stick using ultrasound guidance. An image was captured and stored. Left coronary angiography was done using a Judkins L4 guide catheter. Right coronary angiography was done using a Judkins R4 guide catheter. Left heart cath was done using a JR4 catheter.     Contrast: 34 cc    Estimated blood loss <50 mL.  During this procedure the patient was administered the following to achieve and maintain moderate  conscious sedation: Versed 1 mg, Fentanyl 25 mcg, while the patient's heart rate, blood pressure, and oxygen  saturation were continuously monitored. The period of conscious sedation was 18 minutes, of which I was present face-to-face 100% of this time.  Complications   Complications documented before study signed (07/17/2017 11:39 AM EST)    No complications were associated with this study.  Documented by Jettie Booze, MD - 07/17/2017 11:26 AM EST    Coronary Findings   Diagnostic  Dominance: Right  Left Main  Ost LM to Dist LM lesion 70% stenosed  Ost LM to Dist LM lesion is 70% stenosed.  Left Anterior Descending  Mid LAD lesion 75% stenosed  Mid LAD lesion is 75% stenosed.  First Diagonal Branch  1st Diag lesion 75% stenosed  1st Diag lesion is 75% stenosed.  Left Circumflex  Prox Cx lesion 25% stenosed  Prox Cx lesion is 25% stenosed.  Right Coronary Artery  Prox RCA to Mid RCA lesion 100% stenosed  Prox RCA to Mid RCA lesion is 100% stenosed.  Acute Marginal Branch  Collaterals  Acute Mrg filled by collaterals from Prox RCA.    Right Posterior Atrioventricular Branch  Collaterals  Post Atrio filled by collaterals from 2nd Sept.    Intervention   No interventions have been documented.  Right Heart   Right Heart Pressures Hemodynamic findings consistent with mild pulmonary hypertension. Elevated LV EDP consistent with volume overload.  Left Heart   Left Ventricle LV end diastolic pressure is moderately elevated.  Aortic Valve There is no aortic valve stenosis.  Coronary Diagrams   Diagnostic Diagram        ASSESSMENT AND PLAN: 1. Chronic systolic heart failure, NYHA 2-3 symptoms 2. ESRD, newly dialysis dependent 3. Severe multivessel CAD   Patient's cath study again reviewed and discussed treatment options with both the patient and his wife. He is having no angina. He's adjusting to dialysis and primarily complaining of generalized weakness, fatigue,  and low BP. As previously outlined, PCI would come at extremely high risk as it would require atherectomy/debulking of the left main in the context of severe LV dysfunction and a totally occluded RCA. I would favor ongoing medical therapy and will see him back in 2 months with an echo to reassess degree of LV systolic dysfunction. No medication changes are made today.  Current medicines are reviewed with the patient today.  The patient does not have concerns regarding medicines.  Labs/ tests ordered today include:  No orders of the defined types were placed in this encounter.   Disposition:   FU 2 months with a same day echo  Signed, Sherren Mocha, MD  08/09/2017 4:44 PM    [facility] [place], [place], [place]  [address] Phone: [phone]; Fax: [phone]

## 2017-08-09 NOTE — Patient Instructions (Addendum)
Medication Instructions:  Your provider recommends that you continue on your current medications as directed. Please refer to the Current Medication list given to you today.    Labwork: None  Testing/Procedures: Your provider has requested that you have an echocardiogram on 10/16/17. Please arrive at 1:30PM for your appointment. Echocardiography is a painless test that uses sound waves to create images of your heart. It provides your doctor with information about the size and shape of your heart and how well your heart's chambers and valves are working. This procedure takes approximately one hour. There are no restrictions for this procedure.  Follow-Up: You have an appointment with Dr. Burt Knack 10/16/17 at 3:20PM.  Any Other Special Instructions Will Be Listed Below (If Applicable).     If you need a refill on your cardiac medications before your next appointment, please call your pharmacy.

## 2017-08-12 ENCOUNTER — Encounter: Payer: Self-pay | Admitting: Cardiovascular Disease

## 2017-08-15 ENCOUNTER — Ambulatory Visit: Payer: Self-pay

## 2017-08-15 NOTE — Telephone Encounter (Signed)
Pt. Called to report continued constipation. Stated he thinks his last stool was about 8 days ago, but really can't remember.  Reported he is "passing some gas every once in awhile."  Denied having abdominal pain or bloating.  Denied nausea or vomiting.  Reported his appetite has decreased.  Stated he is straining to have BM, but not able to expel any stool. Denied any loose stool being expelled from rectum.  Reported noting very small amt of bright red blood on toilet tissue, when he wiped. Questioned what he has used to try to have BM?  Stated he was recommended, by the nurse at the [facility], to use Miralax daily.  Reported he took dose of Miralax daily x 3; Sat. through Mon.  Did not take any Miralax today.  Advised to try Fleets enema today.  Appt. given for 3/13 with PCP.  Care advice given per protocol.  Pt. Verb. Understanding.  Agrees with plan.        Reason for Disposition . Last bowel movement (BM) > 4 days ago  Answer Assessment - Initial Assessment Questions 1. STOOL PATTERN OR FREQUENCY: "How often do you pass bowel movements (BMs)?"  (Normal range: tid to q 3 days)  "When was the last BM passed?"       Unsure when he passed stool last; thinks it was 2 small stools 8 days ago 2. STRAINING: "Do you have to strain to have a BM?"      Yes 3. RECTAL PAIN: "Does your rectum hurt when the stool comes out?" If so, ask: "Do you have hemorrhoids? How bad is the pain?"  (Scale 1-10; or mild, moderate, severe)     Yes, c/o rectal pain 4. STOOL COMPOSITION: "Are the stools hard?"      Unable to go  5. BLOOD ON STOOLS: "Has there been any blood on the toilet tissue or on the surface of the BM?" If so, ask: "When was the last time?"      Very small amt. Of bright red blood on toilet tissue 6. CHRONIC CONSTIPATION: "Is this a new problem for you?"  If no, ask: How long have you had this problem?" (days, weeks, months)      Not usual to have contipation  7. CHANGES IN DIET: "Have there been any  recent changes in your diet?"      Appetite has decreased 8. MEDICATIONS: "Have you been taking any new medications?"     9. LAXATIVES: "Have you been using any laxatives or enemas?"  If yes, ask "What, how often, and when was the last time?"   Miralax daily x 3 ; took dose on Sat. AM, Sun. AM, and Mon. AM. 10. CAUSE: "What do you think is causing the constipation?"       Unknown 11. OTHER SYMPTOMS: "Do you have any other symptoms?" (e.g., abdominal pain, fever, vomiting)       Passing gas every once in awhile. Denied abd. Pain or bloating; denied nausea 12. PREGNANCY: "Is there any chance you are pregnant?" "When was your last menstrual period?"      n/a  Protocols used: CONSTIPATION-A-AH

## 2017-08-16 ENCOUNTER — Ambulatory Visit: Payer: Self-pay

## 2017-08-16 ENCOUNTER — Telehealth: Payer: Self-pay | Admitting: Family Medicine

## 2017-08-16 ENCOUNTER — Ambulatory Visit: Payer: Medicare Other | Admitting: Family Medicine

## 2017-08-16 NOTE — Telephone Encounter (Addendum)
Patient's wife called in for advice. She says "my husband has been dealing with constipation. I called and spoke to someone yesterday who told me to give a fleets enema. I didn't get a chance to do it because he had a small BM and then he was up all night going to the bathroom." I asked how much of a BM during the night, she asked him and he said "only little balls every time I went." She asked "do I need to go ahead and give him the fleets enema?" I advised that the [place] yesterday advised the fleets enema and that it would be ok to give it to him today. I also advised to continue with the Miralax everyday, even when he is having BM's. I advised her of the noted by Leandra Kern, RN of Dr. Honor Junes recommendation. I advised if the patient started having abdominal pain and unable to have a BM tonight, to call EMS to take him to the ED for evaluation or call the office in the morning for an appointment, she verbalized understanding.   Reason for Disposition . General information question, no triage required and triager able to answer question  Answer Assessment - Initial Assessment Questions 1. REASON FOR CALL or QUESTION: "What is your reason for calling today?" or "How can I best help you?" or "What question do you have that I can help answer?"     My husband is constipated and I was wondering about giving the fleets enema.  Protocols used: INFORMATION ONLY CALL-A-AH

## 2017-08-16 NOTE — Telephone Encounter (Signed)
Per Dr. Sherren Mocha, his assistant spoke w/ patient earlier and patient had a bowel movement this morning and problem was resolved.  Returned call to patient's wife. She states patient has been resting since BM this morning.  Recommended increased fluid intake, regular exercise, and adequate fiber intake to ensure healthy GI motility moving forward.  Douglas Meyer verbalized understanding of instructions and was appreciative of the call.

## 2017-08-16 NOTE — Telephone Encounter (Signed)
Patient's wife called in for advice. She says "my husband has been dealing with constipation. I called and spoke to someone yesterday who told me to give a fleets enema. I didn't get a chance to do it because he had a small BM and then he was up all night going to the bathroom." I asked how much of a BM during the night, she asked him and he said "only little balls every time I went." She asked "do I need to go ahead and give him the fleets enema?" I advised that the [place] yesterday advised the fleets enema and that it would be ok to give it to him today. I also advised to continue with the Miralax everyday, even when he is having BM's. I advised her of the noted by Leandra Kern, RN of Dr. Honor Junes recommendation. I advised if the patient started having abdominal pain and unable to have a BM tonight, to call EMS to take him to the ED for evaluation or call the office in the morning for an appointment, she verbalized understanding.

## 2017-08-16 NOTE — Telephone Encounter (Signed)
Copied from [place] [id number]. Topic: Quick Communication - See Telephone Encounter >> Aug 16, 2017  9:28 AM Aurelio Brash B wrote: CRM for notification. See Telephone encounter for:  Pt spoke with Dr Sherren Mocha this morning about constipation and the wife wants to know which medication, if any, Dr Sherren Mocha is prescribing  [place], [place] - [place] [phone] (Phone) [phone] (Fax)    08/16/17.

## 2017-08-17 ENCOUNTER — Inpatient Hospital Stay (HOSPITAL_COMMUNITY)
Admission: EM | Admit: 2017-08-17 | Discharge: 2017-08-18 | DRG: 291 | Disposition: A | Discharge status: Home / Self Care | Payer: Medicare Other | Attending: Nephrology | Admitting: Nephrology

## 2017-08-17 ENCOUNTER — Emergency Department (HOSPITAL_COMMUNITY): Payer: Medicare Other

## 2017-08-17 ENCOUNTER — Encounter (HOSPITAL_COMMUNITY): Payer: Self-pay | Admitting: Emergency Medicine

## 2017-08-17 ENCOUNTER — Other Ambulatory Visit: Payer: Self-pay

## 2017-08-17 DIAGNOSIS — M1A0790 Idiopathic chronic gout, unspecified ankle and foot, without tophus (tophi): Secondary | ICD-10-CM

## 2017-08-17 DIAGNOSIS — K5900 Constipation, unspecified: Secondary | ICD-10-CM | POA: Diagnosis not present

## 2017-08-17 DIAGNOSIS — Z7982 Long term (current) use of aspirin: Secondary | ICD-10-CM

## 2017-08-17 DIAGNOSIS — E872 Acidosis, unspecified: Secondary | ICD-10-CM | POA: Diagnosis present

## 2017-08-17 DIAGNOSIS — D638 Anemia in other chronic diseases classified elsewhere: Secondary | ICD-10-CM | POA: Diagnosis not present

## 2017-08-17 DIAGNOSIS — I2510 Atherosclerotic heart disease of native coronary artery without angina pectoris: Secondary | ICD-10-CM | POA: Diagnosis not present

## 2017-08-17 DIAGNOSIS — I2582 Chronic total occlusion of coronary artery: Secondary | ICD-10-CM | POA: Diagnosis not present

## 2017-08-17 DIAGNOSIS — I10 Essential (primary) hypertension: Secondary | ICD-10-CM | POA: Diagnosis not present

## 2017-08-17 DIAGNOSIS — K5641 Fecal impaction: Secondary | ICD-10-CM | POA: Diagnosis not present

## 2017-08-17 DIAGNOSIS — E8770 Fluid overload, unspecified: Secondary | ICD-10-CM | POA: Diagnosis present

## 2017-08-17 DIAGNOSIS — E875 Hyperkalemia: Secondary | ICD-10-CM | POA: Diagnosis present

## 2017-08-17 DIAGNOSIS — Z79899 Other long term (current) drug therapy: Secondary | ICD-10-CM

## 2017-08-17 DIAGNOSIS — E1151 Type 2 diabetes mellitus with diabetic peripheral angiopathy without gangrene: Secondary | ICD-10-CM | POA: Diagnosis not present

## 2017-08-17 DIAGNOSIS — E785 Hyperlipidemia, unspecified: Secondary | ICD-10-CM | POA: Diagnosis present

## 2017-08-17 DIAGNOSIS — Z794 Long term (current) use of insulin: Secondary | ICD-10-CM

## 2017-08-17 DIAGNOSIS — R601 Generalized edema: Secondary | ICD-10-CM | POA: Diagnosis present

## 2017-08-17 DIAGNOSIS — B3749 Other urogenital candidiasis: Secondary | ICD-10-CM | POA: Diagnosis present

## 2017-08-17 DIAGNOSIS — Z87442 Personal history of urinary calculi: Secondary | ICD-10-CM

## 2017-08-17 DIAGNOSIS — K649 Unspecified hemorrhoids: Secondary | ICD-10-CM | POA: Diagnosis present

## 2017-08-17 DIAGNOSIS — Z9115 Patient's noncompliance with renal dialysis: Secondary | ICD-10-CM

## 2017-08-17 DIAGNOSIS — K6289 Other specified diseases of anus and rectum: Secondary | ICD-10-CM | POA: Diagnosis not present

## 2017-08-17 DIAGNOSIS — D631 Anemia in chronic kidney disease: Secondary | ICD-10-CM | POA: Diagnosis not present

## 2017-08-17 DIAGNOSIS — Z7902 Long term (current) use of antithrombotics/antiplatelets: Secondary | ICD-10-CM

## 2017-08-17 DIAGNOSIS — Z87891 Personal history of nicotine dependence: Secondary | ICD-10-CM

## 2017-08-17 DIAGNOSIS — N2581 Secondary hyperparathyroidism of renal origin: Secondary | ICD-10-CM | POA: Diagnosis not present

## 2017-08-17 DIAGNOSIS — I132 Hypertensive heart and chronic kidney disease with heart failure and with stage 5 chronic kidney disease, or end stage renal disease: Secondary | ICD-10-CM | POA: Diagnosis not present

## 2017-08-17 DIAGNOSIS — Z992 Dependence on renal dialysis: Secondary | ICD-10-CM

## 2017-08-17 DIAGNOSIS — N186 End stage renal disease: Secondary | ICD-10-CM | POA: Diagnosis not present

## 2017-08-17 DIAGNOSIS — M109 Gout, unspecified: Secondary | ICD-10-CM | POA: Diagnosis not present

## 2017-08-17 DIAGNOSIS — Z8249 Family history of ischemic heart disease and other diseases of the circulatory system: Secondary | ICD-10-CM

## 2017-08-17 DIAGNOSIS — Z85828 Personal history of other malignant neoplasm of skin: Secondary | ICD-10-CM

## 2017-08-17 DIAGNOSIS — I5023 Acute on chronic systolic (congestive) heart failure: Secondary | ICD-10-CM | POA: Diagnosis present

## 2017-08-17 DIAGNOSIS — I252 Old myocardial infarction: Secondary | ICD-10-CM

## 2017-08-17 DIAGNOSIS — I5021 Acute systolic (congestive) heart failure: Secondary | ICD-10-CM | POA: Diagnosis present

## 2017-08-17 DIAGNOSIS — E1122 Type 2 diabetes mellitus with diabetic chronic kidney disease: Secondary | ICD-10-CM | POA: Diagnosis present

## 2017-08-17 DIAGNOSIS — Z86010 Personal history of colonic polyps: Secondary | ICD-10-CM

## 2017-08-17 LAB — LACTIC ACID, PLASMA: LACTIC ACID, VENOUS: 3.7 mmol/L — AB (ref 0.5–1.9)

## 2017-08-17 LAB — CBC
HCT: 38.4 % — ABNORMAL LOW (ref 39.0–52.0)
Hemoglobin: 12.7 g/dL — ABNORMAL LOW (ref 13.0–17.0)
MCH: 31.8 pg (ref 26.0–34.0)
MCHC: 33.1 g/dL (ref 30.0–36.0)
MCV: 96.2 fL (ref 78.0–100.0)
Platelets: 211 10*3/uL (ref 150–400)
RBC: 3.99 MIL/uL — AB (ref 4.22–5.81)
RDW: 16.6 % — ABNORMAL HIGH (ref 11.5–15.5)
WBC: 8.3 10*3/uL (ref 4.0–10.5)

## 2017-08-17 LAB — BASIC METABOLIC PANEL
ANION GAP: 20 — AB (ref 5–15)
BUN: 73 mg/dL — ABNORMAL HIGH (ref 6–20)
CO2: 20 mmol/L — AB (ref 22–32)
Calcium: 9.4 mg/dL (ref 8.9–10.3)
Chloride: 92 mmol/L — ABNORMAL LOW (ref 101–111)
Creatinine, Ser: 7.14 mg/dL — ABNORMAL HIGH (ref 0.61–1.24)
GFR calc Af Amer: 8 mL/min — ABNORMAL LOW (ref >60)
GFR, EST NON AFRICAN AMERICAN: 7 mL/min — AB (ref >60)
GLUCOSE: 165 mg/dL — AB (ref 65–99)
POTASSIUM: 5.2 mmol/L — AB (ref 3.5–5.1)
Sodium: 132 mmol/L — ABNORMAL LOW (ref 135–145)

## 2017-08-17 LAB — COMPREHENSIVE METABOLIC PANEL
ALT: 34 U/L (ref 17–63)
AST: 29 U/L (ref 15–41)
Albumin: 3.5 g/dL (ref 3.5–5.0)
Alkaline Phosphatase: 179 U/L — ABNORMAL HIGH (ref 38–126)
Anion gap: 20 — ABNORMAL HIGH (ref 5–15)
BUN: 67 mg/dL — ABNORMAL HIGH (ref 6–20)
CHLORIDE: 92 mmol/L — AB (ref 101–111)
CO2: 20 mmol/L — ABNORMAL LOW (ref 22–32)
CREATININE: 7.12 mg/dL — AB (ref 0.61–1.24)
Calcium: 9.8 mg/dL (ref 8.9–10.3)
GFR, EST AFRICAN AMERICAN: 8 mL/min — AB (ref >60)
GFR, EST NON AFRICAN AMERICAN: 7 mL/min — AB (ref >60)
Glucose, Bld: 93 mg/dL (ref 65–99)
POTASSIUM: 6.2 mmol/L — AB (ref 3.5–5.1)
Sodium: 132 mmol/L — ABNORMAL LOW (ref 135–145)
TOTAL PROTEIN: 6.2 g/dL — AB (ref 6.5–8.1)
Total Bilirubin: 1.9 mg/dL — ABNORMAL HIGH (ref 0.3–1.2)

## 2017-08-17 LAB — TYPE AND SCREEN
ABO/RH(D): O NEG
ANTIBODY SCREEN: NEGATIVE

## 2017-08-17 LAB — CBG MONITORING, ED
GLUCOSE-CAPILLARY: 109 mg/dL — AB (ref 65–99)
GLUCOSE-CAPILLARY: 147 mg/dL — AB (ref 65–99)
Glucose-Capillary: 82 mg/dL (ref 65–99)

## 2017-08-17 LAB — I-STAT CG4 LACTIC ACID, ED
LACTIC ACID, VENOUS: 6.69 mmol/L — AB (ref 0.5–1.9)
Lactic Acid, Venous: 3.85 mmol/L (ref 0.5–1.9)

## 2017-08-17 LAB — POC OCCULT BLOOD, ED: FECAL OCCULT BLD: NEGATIVE

## 2017-08-17 LAB — HEMOGLOBIN AND HEMATOCRIT, BLOOD
HEMATOCRIT: 36.1 % — AB (ref 39.0–52.0)
Hemoglobin: 12 g/dL — ABNORMAL LOW (ref 13.0–17.0)

## 2017-08-17 MED ORDER — ACETAMINOPHEN 650 MG RE SUPP: 650.0000 mg | Freq: Four times a day (QID) | RECTAL | Status: DC | PRN

## 2017-08-17 MED ORDER — ATORVASTATIN CALCIUM 20 MG PO TABS
20.0000 mg | ORAL_TABLET | Freq: Every day | ORAL | Status: DC
  Filled 2017-08-17: qty 1

## 2017-08-17 MED ORDER — NYSTATIN 100000 UNIT/GM EX POWD
Freq: Two times a day (BID) | CUTANEOUS | Status: DC
  Administered 2017-08-18: 07:00:00 via TOPICAL
  Filled 2017-08-17: qty 15

## 2017-08-17 MED ORDER — SODIUM CHLORIDE 0.9 % IV BOLUS (SEPSIS): 250.0000 mL | INTRAVENOUS | Status: DC

## 2017-08-17 MED ORDER — ONDANSETRON HCL 4 MG PO TABS: 4.0000 mg | ORAL_TABLET | Freq: Four times a day (QID) | ORAL | Status: DC | PRN

## 2017-08-17 MED ORDER — IPRATROPIUM-ALBUTEROL 0.5-2.5 (3) MG/3ML IN SOLN: 3.0000 mL | RESPIRATORY_TRACT | Status: DC | PRN

## 2017-08-17 MED ORDER — METOPROLOL SUCCINATE ER 25 MG PO TB24: 25.0000 mg | ORAL_TABLET | ORAL | Status: DC

## 2017-08-17 MED ORDER — SODIUM CHLORIDE 0.9 % IV SOLN
1.0000 g | INTRAVENOUS | Status: DC
  Filled 2017-08-17: qty 10

## 2017-08-17 MED ORDER — NEPRO/CARBSTEADY PO LIQD
237.0000 mL | Freq: Three times a day (TID) | ORAL | Status: DC
  Administered 2017-08-18: 237 mL via ORAL

## 2017-08-17 MED ORDER — ASPIRIN 81 MG PO CHEW
81.0000 mg | CHEWABLE_TABLET | Freq: Every day | ORAL | Status: DC
  Administered 2017-08-17 – 2017-08-18 (×2): 81 mg via ORAL
  Filled 2017-08-17 (×2): qty 1

## 2017-08-17 MED ORDER — POLYETHYLENE GLYCOL 3350 17 G PO PACK: 17.0000 g | PACK | Freq: Every day | ORAL | Status: DC

## 2017-08-17 MED ORDER — INSULIN ASPART 100 UNIT/ML ~~LOC~~ SOLN
0.0000 [IU] | Freq: Three times a day (TID) | SUBCUTANEOUS | Status: DC
  Administered 2017-08-18 (×2): 2 [IU] via SUBCUTANEOUS
  Filled 2017-08-17 (×2): qty 1

## 2017-08-17 MED ORDER — POLYETHYLENE GLYCOL 3350 17 G PO PACK: 17.0000 g | PACK | Freq: Every day | ORAL | Status: DC | PRN

## 2017-08-17 MED ORDER — CLOPIDOGREL BISULFATE 75 MG PO TABS
75.0000 mg | ORAL_TABLET | Freq: Every day | ORAL | Status: DC
  Administered 2017-08-18: 75 mg via ORAL
  Filled 2017-08-17: qty 1

## 2017-08-17 MED ORDER — DEXTROSE 50 % IV SOLN
1.0000 | Freq: Once | INTRAVENOUS | Status: AC
  Administered 2017-08-17: 50 mL via INTRAVENOUS
  Filled 2017-08-17: qty 50

## 2017-08-17 MED ORDER — ALLOPURINOL 300 MG PO TABS
300.0000 mg | ORAL_TABLET | Freq: Every day | ORAL | Status: DC
  Administered 2017-08-18: 300 mg via ORAL
  Filled 2017-08-17 (×2): qty 1

## 2017-08-17 MED ORDER — FLEET ENEMA 7-19 GM/118ML RE ENEM: 1.0000 | ENEMA | Freq: Once | RECTAL | Status: DC

## 2017-08-17 MED ORDER — INSULIN ASPART 100 UNIT/ML IV SOLN
10.0000 [IU] | Freq: Once | INTRAVENOUS | Status: AC
  Administered 2017-08-17: 10 [IU] via INTRAVENOUS
  Filled 2017-08-17: qty 0.1

## 2017-08-17 MED ORDER — SORBITOL 70 % SOLN: 960.0000 mL | TOPICAL_OIL | Freq: Once | ORAL | Status: DC

## 2017-08-17 MED ORDER — INSULIN GLARGINE 100 UNIT/ML ~~LOC~~ SOLN
26.0000 [IU] | Freq: Every day | SUBCUTANEOUS | Status: DC
  Filled 2017-08-17 (×2): qty 0.26

## 2017-08-17 MED ORDER — ACETAMINOPHEN 325 MG PO TABS: 650.0000 mg | ORAL_TABLET | Freq: Four times a day (QID) | ORAL | Status: DC | PRN

## 2017-08-17 MED ORDER — CALCIUM ACETATE (PHOS BINDER) 667 MG PO CAPS
667.0000 mg | ORAL_CAPSULE | Freq: Three times a day (TID) | ORAL | Status: DC
  Filled 2017-08-17 (×4): qty 1

## 2017-08-17 MED ORDER — ONDANSETRON HCL 4 MG/2ML IJ SOLN: 4.0000 mg | Freq: Four times a day (QID) | INTRAMUSCULAR | Status: DC | PRN

## 2017-08-17 MED ORDER — HEPARIN SODIUM (PORCINE) 5000 UNIT/ML IJ SOLN
5000.0000 [IU] | Freq: Three times a day (TID) | INTRAMUSCULAR | Status: DC
  Administered 2017-08-17 – 2017-08-18 (×2): 5000 [IU] via SUBCUTANEOUS
  Filled 2017-08-17 (×2): qty 1

## 2017-08-17 MED ORDER — SORBITOL 70 % SOLN
960.0000 mL | TOPICAL_OIL | Freq: Once | ORAL | Status: DC | PRN
  Filled 2017-08-17: qty 473

## 2017-08-17 MED ORDER — GLYCERIN (LAXATIVE) 2.1 G RE SUPP
1.0000 | Freq: Once | RECTAL | Status: DC
  Filled 2017-08-17: qty 1

## 2017-08-17 MED ORDER — SODIUM CHLORIDE 0.9 % IV BOLUS (SEPSIS)
250.0000 mL | Freq: Once | INTRAVENOUS | Status: AC
  Administered 2017-08-17: 250 mL via INTRAVENOUS

## 2017-08-17 MED ORDER — METOPROLOL SUCCINATE ER 25 MG PO TB24
25.0000 mg | ORAL_TABLET | ORAL | Status: DC
  Administered 2017-08-18: 25 mg via ORAL
  Filled 2017-08-17 (×3): qty 1

## 2017-08-17 MED ORDER — HYDROCORTISONE ACETATE 25 MG RE SUPP
25.0000 mg | Freq: Two times a day (BID) | RECTAL | Status: DC
  Filled 2017-08-17 (×3): qty 1

## 2017-08-17 MED ORDER — SODIUM POLYSTYRENE SULFONATE 15 GM/60ML PO SUSP
30.0000 g | Freq: Once | ORAL | Status: AC
  Administered 2017-08-17: 30 g via ORAL
  Filled 2017-08-17: qty 120

## 2017-08-17 MED ORDER — IOPAMIDOL (ISOVUE-300) INJECTION 61%
INTRAVENOUS | Status: AC
  Administered 2017-08-17: 18:00:00
  Filled 2017-08-17: qty 30

## 2017-08-17 MED ORDER — ALBUTEROL SULFATE (2.5 MG/3ML) 0.083% IN NEBU
10.0000 mg | INHALATION_SOLUTION | Freq: Once | RESPIRATORY_TRACT | Status: AC
  Administered 2017-08-17: 10 mg via RESPIRATORY_TRACT
  Filled 2017-08-17: qty 12

## 2017-08-17 MED ORDER — NITROGLYCERIN 0.4 MG SL SUBL: 0.4000 mg | SUBLINGUAL_TABLET | SUBLINGUAL | Status: DC | PRN

## 2017-08-17 MED ORDER — LIDOCAINE HCL (PF) 1 % IJ SOLN
INTRAMUSCULAR | Status: AC
  Administered 2017-08-17: 19:00:00
  Filled 2017-08-17: qty 5

## 2017-08-17 MED ORDER — SODIUM CHLORIDE 0.9 % IV SOLN
1.0000 g | Freq: Once | INTRAVENOUS | Status: AC
  Administered 2017-08-17: 1 g via INTRAVENOUS
  Filled 2017-08-17: qty 10

## 2017-08-17 MED ORDER — FLUCONAZOLE 150 MG PO TABS
150.0000 mg | ORAL_TABLET | Freq: Once | ORAL | Status: AC
  Administered 2017-08-17: 150 mg via ORAL
  Filled 2017-08-17: qty 1

## 2017-08-17 NOTE — ED Provider Notes (Signed)
This is signed out to me by Dr. Vallery Ridge.  He reports that he feels well.  He had copious diarrhea after being treated with Kayexalate.  His peripheral IV line was infiltrated.  Nursing unable to establish further peripheral IV access. Angiocath insertion Performed by: Orlie Dakin  Consent: Verbal consent obtained. Risks and benefits: risks, benefits and alternatives were discussed Time out: Immediately prior to procedure a "time out" was called to verify the correct patient, procedure, equipment, support staff and site/side marked as required.  Preparation: Patient was prepped and draped in the usual sterile fashion.  Vein Location: Right external jugular   Gauge: 20  Normal blood return and flush without difficulty Patient tolerance: Patient tolerated the procedure well with no immediate complications.     Orlie Dakin, MD 08/17/17 2108

## 2017-08-17 NOTE — H&P (Addendum)
History and Physical    Douglas Meyer QIO:[id number] DOB: 20-Mar-1941 DOA: 08/17/2017  Referring MD/NP/PA: Dr. Orlie Dakin PCP: Dorena Cookey, MD  Patient coming from: home   Chief Complaint: Constipation  I have personally briefly reviewed patient's old medical records in [place]   HPI: Douglas Meyer is a 77 y.o. male with medical history significant of HTN, HLD, ESRD on HD(T/Th/Sat),sCHF with last EF 25%, CAD, PVD, DM type II; who presents with complaints of complaints of constipation and rectal pain.  He reports his last bowel movement was approximately 3 days ago.  Tried utilizing MiraLAX without relief of symptoms.  However, today patient noted while straining to try to use the bathroom rectal bleeding.  He has a previous history of hemorrhoids and his last dialysis was 4 days ago.  He reports missing his Tuesday and Thursday dialysis sessions due to his constipation and rectal pain.  Denies having any significant abdominal pain, shortness of breath, chest pain, nausea, vomiting, diarrhea, fever or chills.   Associated symptoms included scrotal redness and swelling that has been present for the last 3 days.  At baseline patient reports wearing adult briefs.  Review of record shows that the patient was last hospitalized in 07/2017, and patient was found to have significantly elevated cardiac enzymes. He underwent catheterization demonstrating severe multivessel coronary artery disease including severe left main stenosis and total occlusion of the native RCA.  EF was noted to be around 25%.  Patient has a poor candidate for intervention and was decided to be managed with aspirin and Plavix.  ED Course: Upon admission into the emergency department patient was seen to be afebrile, respiration 14-26, O2 saturation maintained on room air, and all other vital signs maintained.  Labs revealed WBC 8.3, hemoglobin 12.7, lactic acid trending upwards to 6.69.  Acute abdominal series revealed  signs of cardiac enlargement with vascular congestion with bilateral pleural effusion suggesting fluid overload.  Patient received 250 mL of normal saline IV fluids, Rocephin IV, Diflucan po, 30 g of Kayexalate, 10 units of insulin with amp of D50, and was given albuterol breathing treatment.  Patient was manually disimpacted by the ED physician and noted to have over all incontinence with one large nonbleeding hemorrhoid.  Stool guaiac was noted to be negative.  Following disimpaction patient was noted to have multiple large bowel movements.  The CT scan showed signs of large stool ball with surrounding inflammatory changes consistent with fecal impaction and stercoral colitis monitor right and small left pleural effusions with mild anasarca.  Review of Systems  Constitutional: Negative for chills and fever.  HENT: Positive for hearing loss. Negative for ear discharge and nosebleeds.   Eyes: Negative for pain and discharge.  Respiratory: Negative for cough and shortness of breath.   Cardiovascular: Negative for chest pain and leg swelling.  Gastrointestinal: Positive for constipation. Negative for abdominal pain, nausea and vomiting.       Positive for rectal pain with bleeding  Genitourinary: Negative for dysuria and hematuria.       Positive for scrotal swelling  Musculoskeletal: Negative for falls and joint pain.  Skin: Positive for rash.  Neurological: Negative for speech change and focal weakness.  Endo/Heme/Allergies: Negative for environmental allergies. Does not bruise/bleed easily.  Psychiatric/Behavioral: Negative for hallucinations. The patient is not nervous/anxious.     Past Medical History:  Diagnosis Date  . Bilateral carotid artery disease ([place])   . CAD (coronary artery disease) 05/15/10   out of hospital myocardial  infarction in the 1990's. this was picked up on an  EKG in 1997.  Cardiac catherterization demonstrated an occluded vessel and collateras.  I have no description  of this.  A stress  perfusion study  done 2008 demonstrated an ejection fraction  of  53% with inferolateral  hypokinesis.  There was an inferolateral defect with  scar and some mild mixed ischemia.   managed rx  . Cancer ([place])    skin  . CKD (chronic kidney disease), stage IV ([place])   . Diabetes mellitus    DM, type II  . ED (erectile dysfunction)   . Gout   . History of kidney stones   . Hx of cardiovascular stress test    a. Lexiscan myoview 10/18/12: EF 53%, inf and inf-lat scar, slight peri-infarct ischemia, small area of apical ischemia; no change from 2006  . Hyperlipidemia   . Hypertension   . Myocardial infarction ([place])   . Nephrolithiasis   . Polyp of colon   . PVD (peripheral vascular disease) ([place])     Past Surgical History:  Procedure Laterality Date  . AV FISTULA PLACEMENT Right 06/19/2015   Procedure: ARTERIOVENOUS (AV) FISTULA CREATION;  Surgeon: Serafina Mitchell, MD;  Location: [facility];  Service: Vascular;  Laterality: Right;  . BASCILIC VEIN TRANSPOSITION Left 08/11/2016   Procedure: BASCILIC VEIN TRANSPOSITION-LEFT 1ST STAGE;  Surgeon: Serafina Mitchell, MD;  Location: [facility];  Service: Vascular;  Laterality: Left;  . BASCILIC VEIN TRANSPOSITION Left 10/21/2016   Procedure: LEFT ARM 2ND STAGE BASILIC VEIN TRANSPOSITION;  Surgeon: Serafina Mitchell, MD;  Location: [facility];  Service: Vascular;  Laterality: Left;  . COLONOSCOPY W/ POLYPECTOMY    . CYSTOSCOPY     stone removal x 1  . ENDARTERECTOMY Right 11/28/2014   Procedure: RIGHT CAROTID ENDARTERECTOMY;  Surgeon: Serafina Mitchell, MD;  Location: [facility];  Service: Vascular;  Laterality: Right;  . EYE SURGERY  06/2013   bilateral cataract surgery  . HUMERUS FRACTURE SURGERY     left  . MOHS SURGERY     multiple  . PATCH ANGIOPLASTY Right 11/28/2014   Procedure: PATCH ANGIOPLASTY RIGHT CAROTID;  Surgeon: Serafina Mitchell, MD;  Location: [facility];  Service: Vascular;  Laterality: Right;  . RIGHT/LEFT HEART CATH AND CORONARY ANGIOGRAPHY  N/A 07/17/2017   Procedure: RIGHT/LEFT HEART CATH AND CORONARY ANGIOGRAPHY;  Surgeon: Jettie Booze, MD;  Location: [facility];  Service: Cardiovascular;  Laterality: N/A;     reports that he quit smoking about 44 years ago. He quit after 6.00 years of use. he has never used smokeless tobacco. He reports that he drinks about 4.2 oz of alcohol per week. He reports that he does not use drugs.  No Known Allergies  Family History  Problem Relation Age of Onset  . Heart attack Father        deceased 79  . Diabetes Brother        rheumatic fever, peptic ulcer disease, deceased  . Diabetes Sister        deceased 1  . Heart attack Mother        deceased age 36s  . Diabetes type II Brother   . Diabetes type II Brother     Prior to Admission medications   Medication Sig Start Date End Date Taking? Authorizing Provider  allopurinol (ZYLOPRIM) 300 MG tablet TAKE ONE TABLET BY MOUTH ONCE DAILY 06/07/17  Yes Dorena Cookey, MD  Ascorbic Acid (VITAMIN C)  500 MG tablet Take 500 mg by mouth at bedtime.    Yes [provider]  aspirin 81 MG chewable tablet Chew 1 tablet (81 mg total) by mouth daily. 07/20/17  Yes Sheikh, [place], DO  atorvastatin (LIPITOR) 20 MG tablet TAKE ONE TABLET BY MOUTH ONCE DAILY AT  6  PM 05/12/17  Yes Dorena Cookey, MD  BD PEN NEEDLE NANO U/F 32G X 4 MM MISC  USE AS DIRECTED DAILY 04/25/17  Yes Dorena Cookey, MD  calcium acetate (PHOSLO) 667 MG capsule Take 1 capsule (667 mg total) by mouth 3 (three) times daily with meals. 07/19/17  Yes Sheikh, Omair Latif, DO  clopidogrel (PLAVIX) 75 MG tablet Take 1 tablet (75 mg total) by mouth daily. 07/20/17  Yes Sheikh, Omair Latif, DO  insulin glargine (LANTUS) 100 unit/mL SOPN Inject 26 Units into the skin at bedtime.    Yes [provider]  Lancets (ACCU-CHEK MULTICLIX) lancets 1 each by Other route daily. Dx 250.01 05/07/13  Yes Dorena Cookey, MD  metoprolol succinate (TOPROL-XL) 25 MG 24 hr  tablet Take 25 mg by mouth as directed. 1 tablet twice daily on NON Dialysis days [facility] Sun 1 tablet ONCE daily on all other days (T, TH, Sat)   Yes [provider]  Multiple Vitamins-Minerals (MULTIVITAMIN WITH MINERALS) tablet Take 1 tablet by mouth at bedtime.    Yes [provider]  Nutritional Supplements (FEEDING SUPPLEMENT, NEPRO CARB STEADY,) LIQD Take 237 mLs by mouth 3 (three) times daily between meals. 07/19/17  Yes Sheikh, Omair Latif, DO  Omega-3 Fatty Acids (FISH OIL) 500 MG CAPS Take 500 mg by mouth at bedtime.    Yes [provider]  nitroGLYCERIN (NITROSTAT) 0.4 MG SL tablet Place 1 tablet (0.4 mg total) under the tongue every 5 (five) minutes as needed for up to 25 days for chest pain. 07/28/17 08/22/17  Barrett, Evelene Croon, PA-C    Physical Exam:  Constitutional: Elderly male in NAD, calm, comfortable Vitals:   08/17/17 1845 08/17/17 1900 08/17/17 1915 08/17/17 1930  BP: 103/66 101/67 (!) 108/56 106/65  Pulse: 89 95 90 90  Resp: (!) 26 19 (!) 21 16  Temp:      TempSrc:      SpO2: 97% 100% 100% 100%   Eyes: PERRL, lids and conjunctivae normal ENMT: Mucous membranes are moist. Posterior pharynx clear of any exudate or lesions.  Neck: normal, supple, no masses, no thyromegaly Respiratory: clear to auscultation bilaterally, no wheezing, no crackles. Normal respiratory effort. No accessory muscle use.  Cardiovascular: Regular rhythm with intermittent PVCs, no murmurs / rubs / gallops. No extremity edema. 2+ pedal pulses. No carotid bruits.  Abdomen: no tenderness, no masses palpated. No hepatosplenomegaly. Bowel sounds positive.  Genitourinary: Erythema with satellite rash of the inguinal region including scrotum and penis.   Musculoskeletal: no clubbing / cyanosis. No joint deformity upper and lower extremities. Good ROM, no contractures. Normal muscle tone.  Skin: no rashes, lesions, ulcers. No induration Neurologic: CN 2-12 grossly intact.  Sensation intact, DTR normal. Strength 5/5 in all 4.  Psychiatric: Normal judgment and insight. Alert and oriented x 3. Normal mood.     Labs on Admission: I have personally reviewed following labs and imaging studies  CBC: Recent Labs  Lab 08/17/17 1302  WBC 8.3  HGB 12.7*  HCT 38.4*  MCV 96.2  PLT 063   Basic Metabolic Panel: Recent Labs  Lab 08/17/17 1302  NA 132*  K 6.2*  CL 92*  CO2 20*  GLUCOSE 93  BUN 67*  CREATININE 7.12*  CALCIUM 9.8   GFR: Estimated Creatinine Clearance: 7.7 mL/min (A) (by C-G formula based on SCr of 7.12 mg/dL (H)). Liver Function Tests: Recent Labs  Lab 08/17/17 1302  AST 29  ALT 34  ALKPHOS 179*  BILITOT 1.9*  PROT 6.2*  ALBUMIN 3.5   No results for input(s): LIPASE, AMYLASE in the last 168 hours. No results for input(s): AMMONIA in the last 168 hours. Coagulation Profile: No results for input(s): INR, PROTIME in the last 168 hours. Cardiac Enzymes: No results for input(s): CKTOTAL, CKMB, CKMBINDEX, TROPONINI in the last 168 hours. BNP (last 3 results) No results for input(s): PROBNP in the last 8760 hours. HbA1C: No results for input(s): HGBA1C in the last 72 hours. CBG: Recent Labs  Lab 08/17/17 1555 08/17/17 1736 08/17/17 1754  GLUCAP 82 109* 147*   Lipid Profile: No results for input(s): CHOL, HDL, LDLCALC, TRIG, CHOLHDL, LDLDIRECT in the last 72 hours. Thyroid Function Tests: No results for input(s): TSH, T4TOTAL, FREET4, T3FREE, THYROIDAB in the last 72 hours. Anemia Panel: No results for input(s): VITAMINB12, FOLATE, FERRITIN, TIBC, IRON, RETICCTPCT in the last 72 hours. Urine analysis:    Component Value Date/Time   COLORURINE YELLOW 07/12/2017 2104   APPEARANCEUR CLEAR 07/12/2017 2104   LABSPEC 1.011 07/12/2017 2104   PHURINE 5.0 07/12/2017 2104   GLUCOSEU NEGATIVE 07/12/2017 2104   HGBUR NEGATIVE 07/12/2017 2104   HGBUR negative 06/28/2010 0834   BILIRUBINUR NEGATIVE 07/12/2017 2104   BILIRUBINUR n  04/19/2016 1205   KETONESUR NEGATIVE 07/12/2017 2104   PROTEINUR NEGATIVE 07/12/2017 2104   UROBILINOGEN 0.2 04/19/2016 1205   UROBILINOGEN 0.2 11/20/2014 1509   NITRITE NEGATIVE 07/12/2017 2104   LEUKOCYTESUR NEGATIVE 07/12/2017 2104   Sepsis Labs: No results found for this or any previous visit (from the past 240 hour(s)).   Radiological Exams on Admission: Dg Abd Acute W/chest  Result Date: 08/17/2017 CLINICAL DATA:  Fecal impaction EXAM: DG ABDOMEN ACUTE W/ 1V CHEST COMPARISON:  Chest two-view 11/15/2007 FINDINGS: Cardiac enlargement. Mild vascular congestion with small bilateral effusions and bibasilar atelectasis. Normal bowel gas pattern. Negative for bowel obstruction or ileus. No free air. Arterial calcification. Possible 4 mm right renal calcification versus vascular calcification. No acute skeletal abnormality. IMPRESSION: Cardiac enlargement. Vascular congestion with small bilateral effusions suggesting fluid overload. Bibasilar atelectasis. Normal bowel gas pattern.  Mild amount of stool in the colon. Electronically Signed   By: Franchot Gallo M.D.   On: 08/17/2017 16:13    EKG: Independently reviewed.  Sinus rhythm with PVCs at 88 bpm.  Assessment/Plan Constipation 2/2 fecal impation, rectal bleeding 2/2 hemorrhoid: Acute.  Patient reports having constipation for the last 3 days developing significant rectal pain.  Patient was manually disimpacted by the ED physician and noted to have a large nonbleeding hemorrhoid.  Guaiac stenotic screen negative.  CT scan of abdomen showed fecal impaction with surrounding edema.  Patient has had multiple bowel movements since that time. - Admit to a stepdown bed  - Strict I&O's - MiraLAX daily, hold if patient having frequent diarrhea - Smog enema if needed - Hydrocortisone suppositories for hemorrhoid  - Recheck KUB in a.m.   Candidiasis genitals in male, cellulitis: Acute.  Patient with rash involving inguinal region. Appears to be a  candidal rash.  Given Diflucan and ceftriaxone in the ED. - Follow-up blood cultures - Nystatin powder to scrotum - Continue Rocephin per  pharmacy  Lactic acidosis: Acute.  Patient found to have upward trending lactic acid level up to 6.69.  Patient had initially given 250 mL of normal saline IV fluids.  However, due to patient's poor overall cardiac function and coronary artery disease and low missed dialysis sessions additional IV fluids were withheld.  Suspect likely multifactorial. - continue to trend lactic acid level  Hyperkalemia: Acute.  Initial potassium 6.2 on admission.  Patient was given 30 g of Kayexalate, 10 units of insulin with amp of D50, and was given albuterol breathing treatment. - Recheck potassium level - Will give additional Kayexalate if needed.  End-stage renal disease on hemodialysis: Patient reports missing last 2 hemodialysis sessions.  He reports not feeling short of breath, but chest x-ray revealed signs of cardiac enlargement with vascular congestion and small bilateral effusions.  Due to the patient missing hemodialysis was also noted that his potassium level was elevated to 6.2. - Continuous pulse oximetry, with nasal cannula oxygen as needed - Continue calcium acetate - Message sent for nephrology to eval in a.m.  Systolic congestive heart failure: Acute on Chronic. Patient appears to show signs of being fluid overloaded on x-ray imaging in setting of ESRD and missed HD sessions..  Last EF noted to be 25%. - Strict I&O's - daily weights  Anemia of chronic disease: Hemoglobin noted to be 12.7 which appears higher than patient's previous baseline which appears around 9-10.  Due to rectal bleeding patient was typed and screened for possible need of blood products.  Stool guaiac was noted to be negative. - Recheck hemoglobin  Essential hypertension - Continue metoprolol  Diabetes mellitus type 2: Patient presents with blood glucose of 93. Last hemoglobin A1c  noted to be 7.6 on 07/12/2017. - Hypoglycemic protocols - Continue home Lantus dose   - CBGs q. before meals with sensitive SSI  Coronary artery disease, recent NSTEMI: During patient's last hospitalization in 07/2017 patient was found to have significantly elevated cardiac enzymes. He underwent catheterization demonstrating severe multivessel coronary artery disease including severe left main stenosis and total occlusion of the native RCA.  Patient has a poor candidate for intervention and was decided to be managed with aspirin and Plavix. - Continue aspirin and Plavix  Hyperlipidemia - Continue Lipitor  Gout - Continue allopurinol  DVT prophylaxis: heparin Code Status: Full Family Communication: No family present at bedside Disposition Plan: TBD Consults called: none  Admission status: Inpatient  Norval Morton MD [facility] Pager [phone]   If 7PM-7AM, please contact night-coverage [email or web address] Password [email or web address]  08/17/2017, 7:41 PM

## 2017-08-17 NOTE — ED Provider Notes (Signed)
[facility] EMERGENCY DEPARTMENT Provider Note   CSN: [id number] Arrival date & time: 08/17/17  1219     History   Chief Complaint Chief Complaint  Patient presents with  . Rectal Bleeding    HPI Douglas Meyer is a 77 y.o. male.  HPI Patient reports has been constipated for the past week.  He has been straining at stool.  He reports he is getting a lot of rectal pain.  Today he noticed red bleeding when trying to have a bowel movement.  Patient reports he continues to have a lot of pain in the rectum and the scrotum 2.  No nausea no vomiting.  Patient has history of kidney failure.  He has recently started dialysis.  His schedule is Tuesday Thursday Saturday.  Last dialysis 4 days ago on Saturday.  Patient missed Tuesday and today due to rectal pain and constipation with bleeding.  Patient denies he is having chest pain.  He reports that he has been trying MiraLAX for the past 3 days due to the constipation but is not getting relief. Past Medical History:  Diagnosis Date  . Bilateral carotid artery disease ([place])   . CAD (coronary artery disease) 05/15/10   out of hospital myocardial infarction in the 1990's. this was picked up on an  EKG in 1997.  Cardiac catherterization demonstrated an occluded vessel and collateras.  I have no description of this.  A stress  perfusion study  done 2008 demonstrated an ejection fraction  of  53% with inferolateral  hypokinesis.  There was an inferolateral defect with  scar and some mild mixed ischemia.   managed rx  . Cancer ([place])    skin  . CKD (chronic kidney disease), stage IV ([place])   . Diabetes mellitus    DM, type II  . ED (erectile dysfunction)   . Gout   . History of kidney stones   . Hx of cardiovascular stress test    a. Lexiscan myoview 10/18/12: EF 53%, inf and inf-lat scar, slight peri-infarct ischemia, small area of apical ischemia; no change from 2006  . Hyperlipidemia   . Hypertension   . Myocardial infarction  ([place])   . Nephrolithiasis   . Polyp of colon   . PVD (peripheral vascular disease) [facility])     Patient Active Problem List   Diagnosis Date Noted  . Acute systolic (congestive) heart failure ([place])   . Coronary artery disease involving native coronary artery of native heart without angina pectoris   . Elevated troponin   . Renal failure (ARF), acute on chronic (HCC) 07/13/2017  . Fatigue associated with anemia 07/12/2017  . Sensorineural hearing loss (SNHL) of both ears 01/10/2017  . Asymptomatic stenosis of right carotid artery 11/28/2014  . Carotid bruit 09/22/2014  . Onychomycosis 10/09/2012  . Gout 10/02/2008  . ERECTILE DYSFUNCTION 08/16/2007  . Non-ST elevation (NSTEMI) myocardial infarction ([place]) 08/16/2007  . NEPHROLITHIASIS, HX OF 08/16/2007  . POLYP, COLON 03/29/2007  . Insulin dependent diabetes mellitus ([place]) 02/01/2007  . HYPERLIPIDEMIA 02/01/2007  . Essential hypertension 02/01/2007  . Coronary atherosclerosis 02/01/2007  . Peripheral vascular disease ([place]) 02/01/2007  . Disorder resulting from impaired renal function 02/01/2007    Past Surgical History:  Procedure Laterality Date  . AV FISTULA PLACEMENT Right 06/19/2015   Procedure: ARTERIOVENOUS (AV) FISTULA CREATION;  Surgeon: Serafina Mitchell, MD;  Location: [facility];  Service: Vascular;  Laterality: Right;  . BASCILIC VEIN TRANSPOSITION Left 08/11/2016   Procedure: BASCILIC VEIN TRANSPOSITION-LEFT  1ST STAGE;  Surgeon: Serafina Mitchell, MD;  Location: [facility];  Service: Vascular;  Laterality: Left;  . BASCILIC VEIN TRANSPOSITION Left 10/21/2016   Procedure: LEFT ARM 2ND STAGE BASILIC VEIN TRANSPOSITION;  Surgeon: Serafina Mitchell, MD;  Location[facility];  Service: Vascular;  Laterality: Left;  . COLONOSCOPY W/ POLYPECTOMY    . CYSTOSCOPY     stone removal x 1  . ENDARTERECTOMY Right 11/28/2014   Procedure: RIGHT CAROTID ENDARTERECTOMY;  Surgeon: Serafina Mitchell, MD;  Location: [facility];  Service: Vascular;  Laterality: Right;    . EYE SURGERY  06/2013   bilateral cataract surgery  . HUMERUS FRACTURE SURGERY     left  . MOHS SURGERY     multiple  . PATCH ANGIOPLASTY Right 11/28/2014   Procedure: PATCH ANGIOPLASTY RIGHT CAROTID;  Surgeon: Serafina Mitchell, MD;  Location: [place];  Service: Vascular;  Laterality: Right;  . RIGHT/LEFT HEART CATH AND CORONARY ANGIOGRAPHY N/A 07/17/2017   Procedure: RIGHT/LEFT HEART CATH AND CORONARY ANGIOGRAPHY;  Surgeon: Jettie Booze, MD;  Location: [place] CV LAB;  Service: Cardiovascular;  Laterality: N/A;       Home Medications    Prior to Admission medications   Medication Sig Start Date End Date Taking? Authorizing Provider  allopurinol (ZYLOPRIM) 300 MG tablet TAKE ONE TABLET BY MOUTH ONCE DAILY 06/07/17   Dorena Cookey, MD  Ascorbic Acid (VITAMIN C) 500 MG tablet Take 500 mg by mouth at bedtime.     [provider]  aspirin 81 MG chewable tablet Chew 1 tablet (81 mg total) by mouth daily. 07/20/17   Raiford Noble Latif, DO  atorvastatin (LIPITOR) 20 MG tablet TAKE ONE TABLET BY MOUTH ONCE DAILY AT  6  PM 05/12/17   Dorena Cookey, MD  BD PEN NEEDLE NANO U/F 32G X 4 MM MISC  USE AS DIRECTED DAILY 04/25/17   Dorena Cookey, MD  calcium acetate (PHOSLO) 667 MG capsule Take 1 capsule (667 mg total) by mouth 3 (three) times daily with meals. 07/19/17   Raiford Noble Latif, DO  clopidogrel (PLAVIX) 75 MG tablet Take 1 tablet (75 mg total) by mouth daily. 07/20/17   Sheikh, Georgina Quint Latif, DO  insulin glargine (LANTUS) 100 unit/mL SOPN Inject 16 Units into the skin at bedtime.    [provider]  Lancets (ACCU-CHEK MULTICLIX) lancets 1 each by Other route daily. Dx 250.01 05/07/13   Dorena Cookey, MD  metoprolol succinate (TOPROL-XL) 25 MG 24 hr tablet Take 25 mg by mouth as directed. 1 tablet twice daily on NON Dialysis days [facility]. Sun 1 tablet ONCE daily on all other days (T, TH, Sat)    [provider]  Multiple Vitamins-Minerals (MULTIVITAMIN  WITH MINERALS) tablet Take 1 tablet by mouth at bedtime.     [provider]  nitroGLYCERIN (NITROSTAT) 0.4 MG SL tablet Place 1 tablet (0.4 mg total) under the tongue every 5 (five) minutes as needed for up to 25 days for chest pain. 07/28/17 08/22/17  Barrett, Evelene Croon, PA-C  Nutritional Supplements (FEEDING SUPPLEMENT, NEPRO CARB STEADY,) LIQD Take 237 mLs by mouth 3 (three) times daily between meals. 07/19/17   Sheikh, Georgina Quint Latif, DO  Omega-3 Fatty Acids (FISH OIL) 500 MG CAPS Take 500 mg by mouth at bedtime.     [provider]    Family History Family History  Problem Relation Age of Onset  . Heart attack Father  deceased 12  . Diabetes Brother        rheumatic fever, peptic ulcer disease, deceased  . Diabetes Sister        deceased 13  . Heart attack Mother        deceased age 57s  . Diabetes type II Brother   . Diabetes type II Brother     Social History Social History   Tobacco Use  . Smoking status: Former Smoker    Years: 6.00    Last attempt to quit: 06/06/1973    Years since quitting: 44.2  . Smokeless tobacco: Never Used  Substance Use Topics  . Alcohol use: Yes    Alcohol/week: 4.2 oz    Types: 7 Cans of beer per week    Comment: 1 beer daily  . Drug use: No     Allergies   Patient has no known allergies.   Review of Systems Review of Systems 10 Systems reviewed and are negative for acute change except as noted in the HPI.   Physical Exam Updated Vital Signs BP (!) 108/57   Pulse 88   Temp (!) 97.5 F (36.4 C) (Oral)   Resp (!) 22   SpO2 99%   Physical Exam  Constitutional: He is oriented to person, place, and time.  Patient is alert and appropriate.  He is not in respiratory distress.  HENT:  Mouth/Throat: Oropharynx is clear and moist.  Eyes: EOM are normal.  Cardiovascular:  Heart is regular.  Occasional ectopic beat.  Pulmonary/Chest: Effort normal and breath sounds normal.  Abdominal: Soft. He exhibits no  distension. There is no tenderness. There is no guarding.  Genitourinary:  Genitourinary Comments: Patient has red, erythematous rash around penis with yellow discharge.  Consistent with candidal rash.  Also red candidal rash around scrotum and groin fold.  Rectal exam, stool impaction.  Support with digital manipulation.  Stool is dark brown green.  Patient is having overflow incontinence.  One large but nonbleeding hemorrhoid.  Musculoskeletal: Normal range of motion. He exhibits no tenderness or deformity.  Neurological: He is alert and oriented to person, place, and time. No cranial nerve deficit. He exhibits normal muscle tone. Coordination normal.  Skin: Skin is warm and dry.  Psychiatric: He has a normal mood and affect.    ED Treatments / Results  Labs (all labs ordered are listed, but only abnormal results are displayed) Labs Reviewed  COMPREHENSIVE METABOLIC PANEL - Abnormal; Notable for the following components:      Result Value   Sodium 132 (*)    Potassium 6.2 (*)    Chloride 92 (*)    CO2 20 (*)    BUN 67 (*)    Creatinine, Ser 7.12 (*)    Total Protein 6.2 (*)    Alkaline Phosphatase 179 (*)    Total Bilirubin 1.9 (*)    GFR calc non Af Amer 7 (*)    GFR calc Af Amer 8 (*)    Anion gap 20 (*)    All other components within normal limits  CBC - Abnormal; Notable for the following components:   RBC 3.99 (*)    Hemoglobin 12.7 (*)    HCT 38.4 (*)    RDW 16.6 (*)    All other components within normal limits  I-STAT CG4 LACTIC ACID, ED - Abnormal; Notable for the following components:   Lactic Acid, Venous 3.85 (*)    All other components within normal limits  URINE CULTURE  CULTURE, BLOOD (  ROUTINE X 2)  CULTURE, BLOOD (ROUTINE X 2)  GLUCOSE, RANDOM  URINALYSIS, ROUTINE W REFLEX MICROSCOPIC  POC OCCULT BLOOD, ED  CBG MONITORING, ED  I-STAT CG4 LACTIC ACID, ED  TYPE AND SCREEN    EKG  EKG Interpretation  Date/Time:  Thursday August 17 2017 15:59:00  EDT Ventricular Rate:  88 PR Interval:    QRS Duration: 103 QT Interval:  385 QTC Calculation: 466 R Axis:   103 Text Interpretation:  Sinus rhythm Ventricular trigeminy Probable left atrial enlargement Right axis deviation Consider left ventricular hypertrophy Repol abnrm suggests ischemia, lateral leads Baseline wander in lead(s) V2 V3 Premature ventricular and fusion complexes New since previous tracing Confirmed by [place], Inocente Salles [phone]) on 08/17/2017 4:22:32 PM       Radiology Dg Abd Acute W/chest  Result Date: 08/17/2017 CLINICAL DATA:  Fecal impaction EXAM: DG ABDOMEN ACUTE W/ 1V CHEST COMPARISON:  Chest two-view 11/15/2007 FINDINGS: Cardiac enlargement. Mild vascular congestion with small bilateral effusions and bibasilar atelectasis. Normal bowel gas pattern. Negative for bowel obstruction or ileus. No free air. Arterial calcification. Possible 4 mm right renal calcification versus vascular calcification. No acute skeletal abnormality. IMPRESSION: Cardiac enlargement. Vascular congestion with small bilateral effusions suggesting fluid overload. Bibasilar atelectasis. Normal bowel gas pattern.  Mild amount of stool in the colon. Electronically Signed   By: Franchot Gallo M.D.   On: 08/17/2017 16:13    Procedures Procedures (including critical care time) Manual disimpaction.  Patient had firm but claylike stool in rectal vault.  I have manually removed the majority of this.  There remains some mobile thick stool.  We will proceed with enema. Medications Ordered in ED Medications  Glycerin (Adult) 2.1 g suppository 1 suppository (not administered)  insulin aspart (novoLOG) injection 10 Units (not administered)  dextrose 50 % solution 50 mL (not administered)  sodium chloride 0.9 % bolus 250 mL (not administered)  cefTRIAXone (ROCEPHIN) 1 g in sodium chloride 0.9 % 100 mL IVPB (not administered)  fluconazole (DIFLUCAN) tablet 150 mg (not administered)  iopamidol (ISOVUE-300) 61 %  injection (not administered)  albuterol (PROVENTIL) (2.5 MG/3ML) 0.083% nebulizer solution 10 mg (10 mg Nebulization Given 08/17/17 1453)  sodium polystyrene (KAYEXALATE) 15 GM/60ML suspension 30 g (30 g Oral Given 08/17/17 1609)     Initial Impression / Assessment and Plan / ED Course  I have reviewed the triage vital signs and the nursing notes.  Pertinent labs & imaging results that were available during my care of the patient were reviewed by me and considered in my medical decision making (see chart for details).    Consult: (14: 42) reviewed with nephrology Dr. Detterding.  Give  Kayexalate.  Does not need emergent dialysis Consult: (17: 20) reviewed with [facility] hospitalist dr. Starla Link.  Reviewed patient's medical history and history of present illness.  Wishes to await results of CT scan before proceeding with admission.  Recontact when CT results return. Final Clinical Impressions(s) / ED Diagnoses   Final diagnoses:  Constipation, unspecified constipation type  End stage renal failure on dialysis [facility])  Hyperkalemia  Rectal pain   Patient presents well and above with several acute on chronic problems.  Patient has missed 2 dialysis sessions due to rectal pain and constipation.  Potassium is elevated at 6.2.  He does not have EKG changes.  This was reviewed with nephrology who advises for Kayexalate administration and consultation if patient is admitted for his other presenting complaints.  She does have mild lactic acidosis, general weakness, constipation  with overflow incontinence, rectal pain, severe penile and scrotal candidal infection with some cellulitic appearance as well.  Rocephin and Diflucan initiated, at this time we will plan for admission to hospitalist service with continued diagnostic and observation for lactic acidosis and renal failure. Plan to try to urine. Patient reports he still makes some urine.  CT results pending and then planned observation\admission ED Discharge  Orders    None       Charlesetta Shanks, MD 08/17/17 1728

## 2017-08-17 NOTE — ED Triage Notes (Signed)
Patient from home with complaint of rectal bleeding. Was constipated for several days, started taking miralax 3 days ago, bloody diarrhea today. Patient also is dialysis patient, T,TH,Sat. Last dialysis Saturday. Complains of rectal pain when sitting, pain started before the constipation. Patient in no apparent distress at this time.

## 2017-08-17 NOTE — Progress Notes (Signed)
Pharmacy Antibiotic Note  Douglas Meyer is a 77 y.o. male who presented to the [facility] on 3/14 with rectal bleeding and scrotal pain/cellulitis. Pharmacy has been consulted for Rocephin dosing to cover for cellulitis.   Rocephin 1g IV x 1 already given in the [facility]  Plan: 1. Rocephin 1g IV every 24 hours (next dose on 3/15) 2. Pharmacy will sign off of consult as no dose adjustments are expected at this time   Temp (24hrs), Avg:97.5 F (36.4 C), Min:97.5 F (36.4 C), Max:97.5 F (36.4 C)  Recent Labs  Lab 08/17/17 1302 08/17/17 1639 08/17/17 1927  WBC 8.3  --   --   CREATININE 7.12*  --   --   LATICACIDVEN  --  3.85* 6.69*    Estimated Creatinine Clearance: 7.7 mL/min (A) (by C-G formula based on SCr of 7.12 mg/dL (H)).    No Known Allergies  Antimicrobials this admission: CTX 3/14 >>  Dose adjustments this admission:   Microbiology results: 3/14 BCx >> 3/14 UCx >>   Thank you for allowing pharmacy to be a part of this patient's care.  Alycia Rossetti, PharmD, BCPS Clinical Pharmacist Pager: [phone] Clinical phone for 08/17/2017: [phone] 08/17/2017 9:03 PM

## 2017-08-17 NOTE — ED Notes (Addendum)
Pt had large loose BM again. Pt cleaned up. Denies pain. VSS. All linens and diaper changed. Pt states that he does not make any urine.

## 2017-08-17 NOTE — ED Notes (Signed)
Patient denies pain and is resting comfortably.  

## 2017-08-17 NOTE — ED Notes (Signed)
Attempted iv stick x2 unsuccessfully  

## 2017-08-18 ENCOUNTER — Inpatient Hospital Stay (HOSPITAL_COMMUNITY): Payer: Medicare Other

## 2017-08-18 DIAGNOSIS — K5641 Fecal impaction: Secondary | ICD-10-CM | POA: Diagnosis not present

## 2017-08-18 DIAGNOSIS — K5900 Constipation, unspecified: Secondary | ICD-10-CM | POA: Diagnosis not present

## 2017-08-18 DIAGNOSIS — Z992 Dependence on renal dialysis: Secondary | ICD-10-CM | POA: Diagnosis not present

## 2017-08-18 DIAGNOSIS — N186 End stage renal disease: Secondary | ICD-10-CM | POA: Diagnosis not present

## 2017-08-18 DIAGNOSIS — I132 Hypertensive heart and chronic kidney disease with heart failure and with stage 5 chronic kidney disease, or end stage renal disease: Secondary | ICD-10-CM | POA: Diagnosis not present

## 2017-08-18 DIAGNOSIS — E875 Hyperkalemia: Secondary | ICD-10-CM | POA: Diagnosis not present

## 2017-08-18 LAB — BASIC METABOLIC PANEL
ANION GAP: 22 — AB (ref 5–15)
ANION GAP: 19 — AB (ref 5–15)
BUN: 74 mg/dL — ABNORMAL HIGH (ref 6–20)
BUN: 83 mg/dL — ABNORMAL HIGH (ref 6–20)
CALCIUM: 9 mg/dL (ref 8.9–10.3)
CALCIUM: 9.1 mg/dL (ref 8.9–10.3)
CHLORIDE: 91 mmol/L — AB (ref 101–111)
CO2: 17 mmol/L — ABNORMAL LOW (ref 22–32)
CO2: 23 mmol/L (ref 22–32)
CREATININE: 7.19 mg/dL — AB (ref 0.61–1.24)
Chloride: 91 mmol/L — ABNORMAL LOW (ref 101–111)
Creatinine, Ser: 7.41 mg/dL — ABNORMAL HIGH (ref 0.61–1.24)
GFR calc non Af Amer: 6 mL/min — ABNORMAL LOW (ref >60)
GFR, EST AFRICAN AMERICAN: 8 mL/min — AB (ref >60)
GFR, EST AFRICAN AMERICAN: 7 mL/min — AB (ref >60)
GFR, EST NON AFRICAN AMERICAN: 6 mL/min — AB (ref >60)
Glucose, Bld: 182 mg/dL — ABNORMAL HIGH (ref 65–99)
Glucose, Bld: 174 mg/dL — ABNORMAL HIGH (ref 65–99)
Potassium: 4.9 mmol/L (ref 3.5–5.1)
Potassium: 4.8 mmol/L (ref 3.5–5.1)
SODIUM: 130 mmol/L — AB (ref 135–145)
Sodium: 133 mmol/L — ABNORMAL LOW (ref 135–145)

## 2017-08-18 LAB — CBG MONITORING, ED
GLUCOSE-CAPILLARY: 179 mg/dL — AB (ref 65–99)
Glucose-Capillary: 167 mg/dL — ABNORMAL HIGH (ref 65–99)
Glucose-Capillary: 168 mg/dL — ABNORMAL HIGH (ref 65–99)
Glucose-Capillary: 175 mg/dL — ABNORMAL HIGH (ref 65–99)

## 2017-08-18 LAB — CBC
HCT: 35.2 % — ABNORMAL LOW (ref 39.0–52.0)
Hemoglobin: 11.7 g/dL — ABNORMAL LOW (ref 13.0–17.0)
MCH: 31.5 pg (ref 26.0–34.0)
MCHC: 33.2 g/dL (ref 30.0–36.0)
MCV: 94.6 fL (ref 78.0–100.0)
PLATELETS: 182 10*3/uL (ref 150–400)
RBC: 3.72 MIL/uL — ABNORMAL LOW (ref 4.22–5.81)
RDW: 16.2 % — AB (ref 11.5–15.5)
WBC: 7.6 10*3/uL (ref 4.0–10.5)

## 2017-08-18 LAB — GLUCOSE, CAPILLARY: GLUCOSE-CAPILLARY: 78 mg/dL (ref 65–99)

## 2017-08-18 MED ORDER — HYDROCORTISONE ACETATE 25 MG RE SUPP: 25.0000 mg | Freq: Two times a day (BID) | RECTAL | 0 refills | Status: AC

## 2017-08-18 MED ORDER — SENNOSIDES-DOCUSATE SODIUM 8.6-50 MG PO TABS: 2.0000 | ORAL_TABLET | Freq: Every day | ORAL | 0 refills | Status: AC | PRN

## 2017-08-18 MED ORDER — POLYETHYLENE GLYCOL 3350 17 G PO PACK: 17.0000 g | PACK | Freq: Every day | ORAL | 0 refills | Status: AC | PRN

## 2017-08-18 NOTE — Discharge Summary (Addendum)
Physician Discharge Summary  Meziah Blasingame QQI:[id number] DOB: Jan 05, 1941 DOA: 08/17/2017  PCP: Dorena Cookey, MD  Admit date: 08/17/2017 Discharge date: 08/18/2017  Admitted From:home Disposition:home  Recommendations for Outpatient Follow-up:  1. Follow up with PCP in 1-2 weeks 2. Please resume outpatient hemodialysis is scheduled for tomorrow.  Home Health: No Equipment/Devices: None Discharge Condition: Stable CODE STATUS: Full code Diet recommendation: Heart healthy  Brief/Interim Summary: 77 year old male with history of hypertension, hyperlipidemia, ESRD on hemodialysis, chronic systolic CHF, type 2 diabetes presented with complaint of constipation and rectal pain.  He missed 2 hemodialysis and because of of not feeling well.  In the ER he was found to have elevated lactic acid level with no leukocytosis.  Patient was afebrile.  Chest x-ray with edema.  Patient was disimpacted in the ER.  Noticed to have hemorrhoid.  CT scan showed large fecal impaction and stercoral colitis.  Patient had a mild bleeding after disimpaction which is improved now.  Ordered Anusol, MiraLAX and senna Colace for stool softener.  He has a bowel movement.  Denies rectal pain or bleeding.  Nephrology was contacted to arrange for hemodialysis and fluid overload.  Receiving dialysis today.  He has outpatient scheduled dialysis tomorrow morning which I recommend to continue.  Discussed with the patient's wife and daughter.  Noticed to have mild elevation in lactic acid level on admission which is improved.  Patient did not received dialysis for almost a week before admission.  No sign of infection noticed.  Blood culture negative so far. He does not have chest pain, shortness of breath, fever, nausea vomiting. He is medically stable on discharge.  Recommended close outpatient follow-up. Mild hyperkalemia on admission improved. Received a dose of Diflucan for candidiasis of groin area.  Do not think patient needs  antibiotics further.  Received 2 doses of ceftriaxone.  I called patient's wife and discussed the discharge medication and follow-up plan.  Patient will go to outpatient dialysis unit tomorrow.  Discharge Diagnoses:  Principal Problem:   Constipation Active Problems:   Hyperlipemia   Gout   Essential hypertension   Coronary artery disease involving native coronary artery of native heart without angina pectoris   Acute systolic (congestive) heart failure (HCC)   ESRD on dialysis (HCC)   Anemia of chronic disease   Genital candidiasis in male   Lactic acidosis   Hyperkalemia    Discharge Instructions  Discharge Instructions    Call MD for:  difficulty breathing, headache or visual disturbances   Complete by:  As directed    Call MD for:  extreme fatigue   Complete by:  As directed    Call MD for:  hives   Complete by:  As directed    Call MD for:  persistant dizziness or light-headedness   Complete by:  As directed    Call MD for:  persistant nausea and vomiting   Complete by:  As directed    Call MD for:  severe uncontrolled pain   Complete by:  As directed    Call MD for:  temperature >100.4   Complete by:  As directed    Diet - low sodium heart healthy   Complete by:  As directed    Discharge instructions   Complete by:  As directed    Continue regular dialysis on saturday   Increase activity slowly   Complete by:  As directed      Allergies as of 08/18/2017   No Known Allergies  Medication List    TAKE these medications   accu-chek multiclix lancets 1 each by Other route daily. Dx 250.01   allopurinol 300 MG tablet Commonly known as:  ZYLOPRIM TAKE ONE TABLET BY MOUTH ONCE DAILY   aspirin 81 MG chewable tablet Chew 1 tablet (81 mg total) by mouth daily.   atorvastatin 20 MG tablet Commonly known as:  LIPITOR TAKE ONE TABLET BY MOUTH ONCE DAILY AT  6  PM   BD PEN NEEDLE NANO U/F 32G X 4 MM Misc Generic drug:  Insulin Pen Needle USE AS DIRECTED  DAILY   calcium acetate 667 MG capsule Commonly known as:  PHOSLO Take 1 capsule (667 mg total) by mouth 3 (three) times daily with meals.   clopidogrel 75 MG tablet Commonly known as:  PLAVIX Take 1 tablet (75 mg total) by mouth daily.   feeding supplement (NEPRO CARB STEADY) Liqd Take 237 mLs by mouth 3 (three) times daily between meals.   Fish Oil 500 MG Caps Take 500 mg by mouth at bedtime.   hydrocortisone 25 MG suppository Commonly known as:  ANUSOL-HC Place 1 suppository (25 mg total) rectally 2 (two) times daily.   insulin glargine 100 unit/mL Sopn Commonly known as:  LANTUS Inject 26 Units into the skin at bedtime.   metoprolol succinate 25 MG 24 hr tablet Commonly known as:  TOPROL-XL Take 25 mg by mouth as directed. 1 tablet twice daily on NON Dialysis days M. [facility]. [facility]. Sun 1 tablet ONCE daily on all other days (T, TH, Sat)   multivitamin with minerals tablet Take 1 tablet by mouth at bedtime.   nitroGLYCERIN 0.4 MG SL tablet Commonly known as:  NITROSTAT Place 1 tablet (0.4 mg total) under the tongue every 5 (five) minutes as needed for up to 25 days for chest pain.   polyethylene glycol packet Commonly known as:  MIRALAX / GLYCOLAX Take 17 g by mouth daily as needed for moderate constipation.   senna-docusate 8.6-50 MG tablet Commonly known as:  Senokot-S Take 2 tablets by mouth daily as needed for mild constipation.   vitamin C 500 MG tablet Commonly known as:  ASCORBIC ACID Take 500 mg by mouth at bedtime.      Follow-up Information    Dorena Cookey, MD. Schedule an appointment as soon as possible for a visit in 1 week(s).   Specialty:  Family Medicine Contact information: [place] [address] [phone]        Minus Breeding, MD Follow up.   Specialty:  Cardiology Contact information: [address] [place] [address] [phone]          No Known  Allergies  Consultations: Nephrology  Procedures/Studies: stool disimpaction Dialysis  Subjective: Seen and examined.  Denies rectal pain or bleeding.  Had a bowel movement.  No nausea vomiting chest pain or shortness of breath.  Discharge Exam: Vitals:   08/18/17 1418 08/18/17 1430  BP: 114/60 116/66  Pulse: 86 82  Resp:    Temp:    SpO2:     Vitals:   08/18/17 1300 08/18/17 1400 08/18/17 1418 08/18/17 1430  BP: 111/68 113/61 114/60 116/66  Pulse: 85 89 86 82  Resp: 20 16    Temp:  98 F (36.7 C)    TempSrc:      SpO2: 100%     Weight:  66.5 kg (146 lb 9.7 oz)      General: Pt is alert, awake, not in  acute distress Cardiovascular: RRR, S1/S2 +, no rubs, no gallops Respiratory: Bibasilar decreased breath sounds, no wheezing or rhonchi. Abdominal: Soft, NT, ND, bowel sounds + Extremities: Trace bilateral lower extremity edema    The results of significant diagnostics from this hospitalization (including imaging, microbiology, ancillary and laboratory) are listed below for reference.     Microbiology: Recent Results (from the past 240 hour(s))  Culture, blood (routine x 2)     Status: None (Preliminary result)   Collection Time: 08/17/17  7:20 PM  Result Value Ref Range Status   Specimen Description BLOOD  Final   Special Requests BOTTLES DRAWN AEROBIC AND ANAEROBIC RT EJ  Final   Culture   Final    NO GROWTH < 24 HOURS Performed at [facility], [place] [address], [place], [place] [address]    Report Status PENDING  Incomplete     Labs: BNP (last 3 results) No results for input(s): BNP in the last 8760 hours. Basic Metabolic Panel: Recent Labs  Lab 08/17/17 1302 08/17/17 2241 08/18/17 0331 08/18/17 1345  NA 132* 132* 130* 133*  K 6.2* 5.2* 4.9 4.8  CL 92* 92* 91* 91*  CO2 20* 20* 17* 23  GLUCOSE 93 165* 182* 174*  BUN 67* 73* 74* 83*  CREATININE 7.12* 7.14* 7.19* 7.41*  CALCIUM 9.8 9.4 9.0 9.1   Liver Function Tests: Recent Labs  Lab  08/17/17 1302  AST 29  ALT 34  ALKPHOS 179*  BILITOT 1.9*  PROT 6.2*  ALBUMIN 3.5   No results for input(s): LIPASE, AMYLASE in the last 168 hours. No results for input(s): AMMONIA in the last 168 hours. CBC: Recent Labs  Lab 08/17/17 1302 08/17/17 2241 08/18/17 0331  WBC 8.3  --  7.6  HGB 12.7* 12.0* 11.7*  HCT 38.4* 36.1* 35.2*  MCV 96.2  --  94.6  PLT 211  --  182   Cardiac Enzymes: No results for input(s): CKTOTAL, CKMB, CKMBINDEX, TROPONINI in the last 168 hours. BNP: Invalid input(s): POCBNP CBG: Recent Labs  Lab 08/17/17 1754 08/18/17 0002 08/18/17 0735 08/18/17 1225 08/18/17 1330  GLUCAP 147* 167* 179* 175* 168*   D-Dimer No results for input(s): DDIMER in the last 72 hours. Hgb A1c No results for input(s): HGBA1C in the last 72 hours. Lipid Profile No results for input(s): CHOL, HDL, LDLCALC, TRIG, CHOLHDL, LDLDIRECT in the last 72 hours. Thyroid function studies No results for input(s): TSH, T4TOTAL, T3FREE, THYROIDAB in the last 72 hours.  Invalid input(s): FREET3 Anemia work up No results for input(s): VITAMINB12, FOLATE, FERRITIN, TIBC, IRON, RETICCTPCT in the last 72 hours. Urinalysis    Component Value Date/Time   COLORURINE YELLOW 07/12/2017 2104   APPEARANCEUR CLEAR 07/12/2017 2104   LABSPEC 1.011 07/12/2017 2104   PHURINE 5.0 07/12/2017 2104   GLUCOSEU NEGATIVE 07/12/2017 2104   HGBUR NEGATIVE 07/12/2017 2104   HGBUR negative 06/28/2010 0834   BILIRUBINUR NEGATIVE 07/12/2017 2104   BILIRUBINUR n 04/19/2016 1205   KETONESUR NEGATIVE 07/12/2017 2104   PROTEINUR NEGATIVE 07/12/2017 2104   UROBILINOGEN 0.2 04/19/2016 1205   UROBILINOGEN 0.2 11/20/2014 1509   NITRITE NEGATIVE 07/12/2017 2104   LEUKOCYTESUR NEGATIVE 07/12/2017 2104   Sepsis Labs Invalid input(s): PROCALCITONIN,  WBC,  LACTICIDVEN Microbiology Recent Results (from the past 240 hour(s))  Culture, blood (routine x 2)     Status: None (Preliminary result)   Collection  Time: 08/17/17  7:20 PM  Result Value Ref Range Status   Specimen Description BLOOD  Final   Special Requests BOTTLES DRAWN AEROBIC AND ANAEROBIC RT EJ  Final   Culture   Final    NO GROWTH < 24 HOURS Performed at [facility], [place] [address] [place], [place] [address]    Report Status PENDING  Incomplete     Time coordinating discharge: 28 minutes  SIGNED:   Rosita Fire, MD  [facility] 08/18/2017, 3:04 PM  If 7PM-7AM, please contact night-coverage [email or web address] Password [email or web address]

## 2017-08-18 NOTE — Procedures (Signed)
I was present at this dialysis session. I have reviewed the session itself and made appropriate changes.   Filed Weights   08/18/17 1400  Weight: 66.5 kg (146 lb 9.7 oz)    Recent Labs  Lab 08/18/17 1345  NA 133*  K 4.8  CL 91*  CO2 23  GLUCOSE 174*  BUN 83*  CREATININE 7.41*  CALCIUM 9.1    Recent Labs  Lab 08/17/17 1302 08/17/17 2241 08/18/17 0331  WBC 8.3  --  7.6  HGB 12.7* 12.0* 11.7*  HCT 38.4* 36.1* 35.2*  MCV 96.2  --  94.6  PLT 211  --  182    Scheduled Meds: . allopurinol  300 mg Oral Daily  . aspirin  81 mg Oral Daily  . atorvastatin  20 mg Oral q1800  . calcium acetate  667 mg Oral TID WC  . clopidogrel  75 mg Oral Daily  . feeding supplement (NEPRO CARB STEADY)  237 mL Oral TID BM  . Glycerin (Adult)  1 suppository Rectal Once  . heparin  5,000 Units Subcutaneous Q8H  . hydrocortisone  25 mg Rectal BID  . insulin aspart  0-9 Units Subcutaneous TID WC  . insulin glargine  26 Units Subcutaneous QHS  . metoprolol succinate  25 mg Oral 2 times per day on Sun Mon Wed Fri   And  . [START ON 08/19/2017] metoprolol succinate  25 mg Oral Once per day on Tue Thu Sat  . nystatin   Topical BID   Continuous Infusions: . cefTRIAXone (ROCEPHIN)  IV     PRN Meds:.acetaminophen **OR** acetaminophen, ipratropium-albuterol, nitroGLYCERIN, ondansetron **OR** ondansetron (ZOFRAN) IV, polyethylene glycol, sorbitol, milk of mag, mineral oil, glycerin (SMOG) enema   Pearson Grippe  MD 08/18/2017, 2:33 PM

## 2017-08-18 NOTE — ED Notes (Signed)
Patient denies pain and is resting comfortably.  

## 2017-08-18 NOTE — ED Notes (Signed)
Pt changed again. Small amount of BM noted. Pt stable. Denies pain. Aware of bed status.

## 2017-08-18 NOTE — Consult Note (Signed)
[facility] Renal Consultation Note    Indication for Consultation:  Management of ESRD/hemodialysis; anemia, hypertension/volume and secondary hyperparathyroidism  HPI: Douglas Meyer is a 77 y.o. male with ESRD on HD, HTN, DM2, ICM/CAD/NSTEMI in 07/2017, CHF, EF 25%. Admitted with constipation /rectal pain x 4-5 days. Abd imaging showing fecal impaction. Manually disimpacted in ED. Also found to have large nonbleeding hemorrhoid. FOBT negative.   Labs notable for K 6.2 on admission, treated with Kayexalate and repeat K 4.9 this am. Hgb stable 11.7.   Seen in ED with wife present. Feeling much better than yesterday. Has had multiple bowel movements overnight. Does endorse some SOB today, especially when laying flat in bed. Denies abd pain, N,V,D, melena.   Dialyzes at [facility] ([facility]). He has missed his last 2 dialysis treatments. He says because of abdominal pain with fecal impaction. Too uncomfortable to sit in chair. Last dialysis was Saturday 3/9 and left 0.5kg below EDW. He has been tolerating dialysis well, but UF has been limited by low BP during HD.   Past Medical History:  Diagnosis Date  . Bilateral carotid artery disease ([place])   . CAD (coronary artery disease) 05/15/10   out of hospital myocardial infarction in the 1990's. this was picked up on an  EKG in 1997.  Cardiac catherterization demonstrated an occluded vessel and collateras.  I have no description of this.  A stress  perfusion study  done 2008 demonstrated an ejection fraction  of  53% with inferolateral  hypokinesis.  There was an inferolateral defect with  scar and some mild mixed ischemia.   managed rx  . Cancer ([place])    skin  . CKD (chronic kidney disease), stage IV ([place])   . Diabetes mellitus    DM, type II  . ED (erectile dysfunction)   . Gout   . History of kidney stones   . Hx of cardiovascular stress test    a. Lexiscan myoview 10/18/12: EF 53%, inf and inf-lat scar, slight  peri-infarct ischemia, small area of apical ischemia; no change from 2006  . Hyperlipidemia   . Hypertension   . Myocardial infarction ([place])   . Nephrolithiasis   . Polyp of colon   . PVD (peripheral vascular disease) ([place])    Past Surgical History:  Procedure Laterality Date  . AV FISTULA PLACEMENT Right 06/19/2015   Procedure: ARTERIOVENOUS (AV) FISTULA CREATION;  Surgeon: Serafina Mitchell, MD;  Location: [place];  Service: Vascular;  Laterality: Right;  . BASCILIC VEIN TRANSPOSITION Left 08/11/2016   Procedure: BASCILIC VEIN TRANSPOSITION-LEFT 1ST STAGE;  Surgeon: Serafina Mitchell, MD;  Location: [place];  Service: Vascular;  Laterality: Left;  . BASCILIC VEIN TRANSPOSITION Left 10/21/2016   Procedure: LEFT ARM 2ND STAGE BASILIC VEIN TRANSPOSITION;  Surgeon: Serafina Mitchell, MD;  Location: [facility];  Service: Vascular;  Laterality: Left;  . COLONOSCOPY W/ POLYPECTOMY    . CYSTOSCOPY     stone removal x 1  . ENDARTERECTOMY Right 11/28/2014   Procedure: RIGHT CAROTID ENDARTERECTOMY;  Surgeon: Serafina Mitchell, MD;  Location: [place];  Service: Vascular;  Laterality: Right;  . EYE SURGERY  06/2013   bilateral cataract surgery  . HUMERUS FRACTURE SURGERY     left  . MOHS SURGERY     multiple  . PATCH ANGIOPLASTY Right 11/28/2014   Procedure: PATCH ANGIOPLASTY RIGHT CAROTID;  Surgeon: Serafina Mitchell, MD;  Location: [place];  Service: Vascular;  Laterality: Right;  . RIGHT/LEFT  HEART CATH AND CORONARY ANGIOGRAPHY N/A 07/17/2017   Procedure: RIGHT/LEFT HEART CATH AND CORONARY ANGIOGRAPHY;  Surgeon: Jettie Booze, MD;  Location: [place] CV [facility];  Service: Cardiovascular;  Laterality: N/A;   Family History  Problem Relation Age of Onset  . Heart attack Father        deceased 86  . Diabetes Brother        rheumatic fever, peptic ulcer disease, deceased  . Diabetes Sister        deceased 56  . Heart attack Mother        deceased age 5s  . Diabetes type II Brother   . Diabetes type II  Brother    Social History:  reports that he quit smoking about 44 years ago. He quit after 6.00 years of use. he has never used smokeless tobacco. He reports that he drinks about 4.2 oz of alcohol per week. He reports that he does not use drugs. No Known Allergies Prior to Admission medications   Medication Sig Start Date End Date Taking? Authorizing Provider  allopurinol (ZYLOPRIM) 300 MG tablet TAKE ONE TABLET BY MOUTH ONCE DAILY 06/07/17  Yes Dorena Cookey, MD  Ascorbic Acid (VITAMIN C) 500 MG tablet Take 500 mg by mouth at bedtime.    Yes [provider]  aspirin 81 MG chewable tablet Chew 1 tablet (81 mg total) by mouth daily. 07/20/17  Yes Sheikh, [place], DO  atorvastatin (LIPITOR) 20 MG tablet TAKE ONE TABLET BY MOUTH ONCE DAILY AT  6  PM 05/12/17  Yes Dorena Cookey, MD  BD PEN NEEDLE NANO U/F 32G X 4 MM MISC  USE AS DIRECTED DAILY 04/25/17  Yes Dorena Cookey, MD  calcium acetate (PHOSLO) 667 MG capsule Take 1 capsule (667 mg total) by mouth 3 (three) times daily with meals. 07/19/17  Yes Sheikh, Omair Latif, DO  clopidogrel (PLAVIX) 75 MG tablet Take 1 tablet (75 mg total) by mouth daily. 07/20/17  Yes Sheikh, Omair Latif, DO  insulin glargine (LANTUS) 100 unit/mL SOPN Inject 26 Units into the skin at bedtime.    Yes [provider]  Lancets (ACCU-CHEK MULTICLIX) lancets 1 each by Other route daily. Dx 250.01 05/07/13  Yes Dorena Cookey, MD  metoprolol succinate (TOPROL-XL) 25 MG 24 hr tablet Take 25 mg by mouth as directed. 1 tablet twice daily on NON Dialysis days M. W. F. Sun 1 tablet ONCE daily on all other days (T, TH, Sat)   Yes [provider]  Multiple Vitamins-Minerals (MULTIVITAMIN WITH MINERALS) tablet Take 1 tablet by mouth at bedtime.    Yes [provider]  Nutritional Supplements (FEEDING SUPPLEMENT, NEPRO CARB STEADY,) LIQD Take 237 mLs by mouth 3 (three) times daily between meals. 07/19/17  Yes Sheikh, Omair Latif, DO  Omega-3  Fatty Acids (FISH OIL) 500 MG CAPS Take 500 mg by mouth at bedtime.    Yes [provider]  nitroGLYCERIN (NITROSTAT) 0.4 MG SL tablet Place 1 tablet (0.4 mg total) under the tongue every 5 (five) minutes as needed for up to 25 days for chest pain. 07/28/17 08/22/17  Barrett, Evelene Croon, PA-C   Current Facility-Administered Medications  Medication Dose Route Frequency Provider Last Rate Last Dose  . acetaminophen (TYLENOL) tablet 650 mg  650 mg Oral Q6H PRN Fuller Plan A, MD       Or  . acetaminophen (TYLENOL) suppository 650 mg  650 mg Rectal Q6H PRN Norval Morton, MD      .  allopurinol (ZYLOPRIM) tablet 300 mg  300 mg Oral Daily Smith, Rondell A, MD      . aspirin chewable tablet 81 mg  81 mg Oral Daily Smith, Rondell A, MD   81 mg at 08/17/17 2340  . atorvastatin (LIPITOR) tablet 20 mg  20 mg Oral q1800 Smith, Rondell A, MD      . calcium acetate (PHOSLO) capsule 667 mg  667 mg Oral TID WC Smith, Rondell A, MD      . cefTRIAXone (ROCEPHIN) 1 g in sodium chloride 0.9 % 100 mL IVPB  1 g Intravenous Q24H Rolla Flatten, [facility]      . clopidogrel (PLAVIX) tablet 75 mg  75 mg Oral Daily Smith, Rondell A, MD      . feeding supplement (NEPRO CARB STEADY) liquid 237 mL  237 mL Oral TID BM Smith, Rondell A, MD      . Glycerin (Adult) 2.1 g suppository 1 suppository  1 suppository Rectal Once Charlesetta Shanks, MD   Stopped at 08/17/17 1747  . heparin injection 5,000 Units  5,000 Units Subcutaneous Q8H Fuller Plan A, MD   5,000 Units at 08/18/17 0740  . hydrocortisone (ANUSOL-HC) suppository 25 mg  25 mg Rectal BID Norval Morton, MD   Stopped at 08/17/17 2352  . insulin aspart (novoLOG) injection 0-9 Units  0-9 Units Subcutaneous TID WC Norval Morton, MD   2 Units at 08/18/17 0740  . insulin glargine (LANTUS) injection 26 Units  26 Units Subcutaneous QHS Norval Morton, MD   Stopped at 08/17/17 2330  . ipratropium-albuterol (DUONEB) 0.5-2.5 (3) MG/3ML nebulizer solution 3 mL  3 mL  Nebulization Q4H PRN Tamala Julian, Rondell A, MD      . metoprolol succinate (TOPROL-XL) 24 hr tablet 25 mg  25 mg Oral 2 times per day on Sun Mon Wed Fri Norval Morton, MD       And  . Derrill Memo ON 08/19/2017] metoprolol succinate (TOPROL-XL) 24 hr tablet 25 mg  25 mg Oral Once per day on Tue Thu Sat Fuller Plan A, MD      . nitroGLYCERIN (NITROSTAT) SL tablet 0.4 mg  0.4 mg Sublingual Q5 min PRN Fuller Plan A, MD      . nystatin (MYCOSTATIN/NYSTOP) topical powder   Topical BID Tamala Julian, Rondell A, MD      . ondansetron (ZOFRAN) tablet 4 mg  4 mg Oral Q6H PRN Fuller Plan A, MD       Or  . ondansetron (ZOFRAN) injection 4 mg  4 mg Intravenous Q6H PRN Smith, Rondell A, MD      . polyethylene glycol (MIRALAX / GLYCOLAX) packet 17 g  17 g Oral Daily PRN Smith, Rondell A, MD      . sorbitol, milk of mag, mineral oil, glycerin (SMOG) enema  960 mL Rectal Once PRN Norval Morton, MD       Current Outpatient Medications  Medication Sig Dispense Refill  . allopurinol (ZYLOPRIM) 300 MG tablet TAKE ONE TABLET BY MOUTH ONCE DAILY 90 tablet 3  . Ascorbic Acid (VITAMIN C) 500 MG tablet Take 500 mg by mouth at bedtime.     Marland Kitchen aspirin 81 MG chewable tablet Chew 1 tablet (81 mg total) by mouth daily. 30 tablet 0  . atorvastatin (LIPITOR) 20 MG tablet TAKE ONE TABLET BY MOUTH ONCE DAILY AT  6  PM 90 tablet 3  . BD PEN NEEDLE NANO U/F 32G X 4 MM MISC  USE AS DIRECTED DAILY  100 each 0  . calcium acetate (PHOSLO) 667 MG capsule Take 1 capsule (667 mg total) by mouth 3 (three) times daily with meals. 90 capsule 0  . clopidogrel (PLAVIX) 75 MG tablet Take 1 tablet (75 mg total) by mouth daily. 30 tablet 0  . insulin glargine (LANTUS) 100 unit/mL SOPN Inject 26 Units into the skin at bedtime.     . Lancets (ACCU-CHEK MULTICLIX) lancets 1 each by Other route daily. Dx 250.01 102 each 3  . metoprolol succinate (TOPROL-XL) 25 MG 24 hr tablet Take 25 mg by mouth as directed. 1 tablet twice daily on NON Dialysis days M.  W. F. Sun 1 tablet ONCE daily on all other days (T, TH, Sat)    . Multiple Vitamins-Minerals (MULTIVITAMIN WITH MINERALS) tablet Take 1 tablet by mouth at bedtime.     . Nutritional Supplements (FEEDING SUPPLEMENT, NEPRO CARB STEADY,) LIQD Take 237 mLs by mouth 3 (three) times daily between meals. 10 Can 0  . Omega-3 Fatty Acids (FISH OIL) 500 MG CAPS Take 500 mg by mouth at bedtime.     . nitroGLYCERIN (NITROSTAT) 0.4 MG SL tablet Place 1 tablet (0.4 mg total) under the tongue every 5 (five) minutes as needed for up to 25 days for chest pain. 25 tablet 3    ROS: As per HPI otherwise negative.  Physical Exam: Vitals:   08/18/17 1000 08/18/17 1015 08/18/17 1030 08/18/17 1045  BP: 105/63 112/64 110/63 110/65  Pulse: 85 84 83 83  Resp: 16 15 16 18   Temp:      TempSrc:      SpO2: 95% 97% 97% 97%     General: WDWN elderly male NAD Head: NCAT sclera not icteric MMM Neck: Supple. No JVD No masses Lungs: CTA bilaterally without wheezes, rales, or rhonchi. Breathing is unlabored. Heart: RRR with S1 S2 Abdomen: soft NT + BS Lower extremities: 2+ pitting LE edema R>L  Neuro: A & O  X 3. Moves all extremities spontaneously. Psych:  Responds to questions appropriately with a normal affect. Dialysis Access: LUE AVF +bruit  Labs: Basic Metabolic Panel: Recent Labs  Lab 08/17/17 1302 08/17/17 2241 08/18/17 0331  NA 132* 132* 130*  K 6.2* 5.2* 4.9  CL 92* 92* 91*  CO2 20* 20* 17*  GLUCOSE 93 165* 182*  BUN 67* 73* 74*  CREATININE 7.12* 7.14* 7.19*  CALCIUM 9.8 9.4 9.0   Liver Function Tests: Recent Labs  Lab 08/17/17 1302  AST 29  ALT 34  ALKPHOS 179*  BILITOT 1.9*  PROT 6.2*  ALBUMIN 3.5   No results for input(s): LIPASE, AMYLASE in the last 168 hours. No results for input(s): AMMONIA in the last 168 hours. CBC: Recent Labs  Lab 08/17/17 1302 08/17/17 2241 08/18/17 0331  WBC 8.3  --  7.6  HGB 12.7* 12.0* 11.7*  HCT 38.4* 36.1* 35.2*  MCV 96.2  --  94.6  PLT 211   --  182   Cardiac Enzymes: No results for input(s): CKTOTAL, CKMB, CKMBINDEX, TROPONINI in the last 168 hours. CBG: Recent Labs  Lab 08/17/17 1555 08/17/17 1736 08/17/17 1754 08/18/17 0002 08/18/17 0735  GLUCAP 82 109* 147* 167* 179*   Iron Studies: No results for input(s): IRON, TIBC, TRANSFERRIN, FERRITIN in the last 72 hours. Studies/Results: Ct Abdomen Pelvis Wo Contrast  Result Date: 08/17/2017 CLINICAL DATA:  Abdominal distention with rectal and scrotal pain for the past week. EXAM: CT ABDOMEN AND PELVIS WITHOUT CONTRAST TECHNIQUE: Multidetector CT imaging of the  abdomen and pelvis was performed following the standard protocol without IV contrast. COMPARISON:  None. FINDINGS: Lower chest: Moderate right and small left pleural effusions. Cardiomegaly. Hepatobiliary: No focal liver abnormality is seen. No gallstones, gallbladder wall thickening, or biliary dilatation. Pancreas: Mild atrophy. No ductal dilatation or surrounding inflammatory changes. Spleen: Normal in size without focal abnormality. Adrenals/Urinary Tract: The adrenal glands are unremarkable. Mild bilateral renal cortical atrophy. 6 mm calculus in the lower pole of the left kidney. No hydronephrosis. The bladder is mildly distended. Stomach/Bowel: Large stool ball in the rectum with mild surrounding inflammatory changes. Scattered colonic diverticulosis. Normal appendix. The stomach and small bowel are unremarkable. Vascular/Lymphatic: Extensive aortic and branch vessel atherosclerosis. No lymphadenopathy. Reproductive: The prostate gland is unremarkable. Calcification of the vas deferens. Other: No free fluid or pneumoperitoneum. Musculoskeletal: Mild anasarca. No acute or significant osseous finding. IMPRESSION: 1. Large stool ball in the rectum with mild surrounding inflammatory changes, consistent with fecal impaction and stercolitis. 2. Moderate right and small left pleural effusions.  Mild anasarca. 3. Nonobstructive left  nephrolithiasis. 4.  Aortic atherosclerosis (ICD10-I70.0). Electronically Signed   By: Titus Dubin M.D.   On: 08/17/2017 20:21   Dg Abd 1 View  Result Date: 08/18/2017 CLINICAL DATA:  Fecal impaction EXAM: ABDOMEN - 1 VIEW COMPARISON:  CT 08/17/2017 FINDINGS: Mild amount of stool throughout the colon. Moderate stool in the rectum. Negative for bowel obstruction. Mild lumbar degenerative change.  Vascular calcification IMPRESSION: Rectal impaction without bowel obstruction. Electronically Signed   By: Franchot Gallo M.D.   On: 08/18/2017 08:29   Dg Abd Acute W/chest  Result Date: 08/17/2017 CLINICAL DATA:  Fecal impaction EXAM: DG ABDOMEN ACUTE W/ 1V CHEST COMPARISON:  Chest two-view 11/15/2007 FINDINGS: Cardiac enlargement. Mild vascular congestion with small bilateral effusions and bibasilar atelectasis. Normal bowel gas pattern. Negative for bowel obstruction or ileus. No free air. Arterial calcification. Possible 4 mm right renal calcification versus vascular calcification. No acute skeletal abnormality. IMPRESSION: Cardiac enlargement. Vascular congestion with small bilateral effusions suggesting fluid overload. Bibasilar atelectasis. Normal bowel gas pattern.  Mild amount of stool in the colon. Electronically Signed   By: Franchot Gallo M.D.   On: 08/17/2017 16:13    Dialysis Orders:  [facility] TTS 4h 180NRe BFR 400/800 EDW 66kg 3K/2.25Ca Hectorol 62mcg IV q TIW Venofer 50mg  IV q week  LUE AVF No Heparin   Assessment/Plan: 1. Constipation/Fecal impaction - improving, moving bowels. Likely for discharge today after HD.  2. ESRD -  TThS. Last dialysis was Saturday 3/9. HD today then back to regular schedule tomorrow.  3. Hypertension/volume  - Chronic hypotension limits UF/Does have LE edema and volume excess on xray/ UF to EDW as tolerated   4. Anemia  - Hgb 11.7 No esa needs  5. Metabolic bone disease -  Continue VDRA/Ca acetate 1 qac tid 6. Recent NSTEMI 07/2017/ CHF/EF 25%  7. DM2    Lynnda Child PA-C [facility] Pager [phone] 08/18/2017, 11:19 AM

## 2017-08-18 NOTE — ED Notes (Signed)
Pt had another large loose BM. Denies pain. VSS. A & O x 4.

## 2017-08-18 NOTE — ED Notes (Signed)
Doctor @ bedside

## 2017-08-18 NOTE — Progress Notes (Signed)
Patient arrived to unit per ED stretcher.  Reviewed treatment plan and this RN agrees.  Report received from ED RN, Carlis Abbott.  Consent obtained.  Patient A & O X 4. Lung sounds clear to ausculation in all fields. BLE 1+ pitting edema. Cardiac: NSR.  Prepped LUAVF with alcohol and cannulated with two 16 gauge needles.  Pulsation of blood noted.  Flushed access well with saline per protocol.  Connected and secured lines and initiated tx at 1418.  UF goal of 3000 mL and net fluid removal of 2500 mL.  Will continue to monitor.

## 2017-08-18 NOTE — Progress Notes (Addendum)
Discharge summary reviewed with patient and wife. Pt and wife had no questions. Vitals and blood sugar stable. Pt had right EJ upon arrival to unit; this was removed. Site clean, dry, and intact. Catheter intact upon removal.  Pt given discharge summary copy and taken to lobby in wheelchair by nurse tech.

## 2017-08-19 LAB — HEPATITIS B SURFACE ANTIGEN: Hepatitis B Surface Ag: NEGATIVE

## 2017-08-22 ENCOUNTER — Telehealth: Payer: Self-pay | Admitting: Family Medicine

## 2017-08-22 LAB — CULTURE, BLOOD (ROUTINE X 2)
Culture: NO GROWTH
SPECIAL REQUESTS: ADEQUATE

## 2017-08-22 NOTE — Telephone Encounter (Signed)
Spoke with Shaunda with [facility] gave verbal orders for pt to continue PT 1 time a week for 3 additional weeks for his gait and instability and also for skill nursing consultation.

## 2017-08-22 NOTE — Telephone Encounter (Unsigned)
Copied from [place]. Topic: General - Other >> Aug 22, 2017 11:28 AM Carolyn Stare wrote:  Shelby Dubin with [facility] call to ask for verbal to continue to see pt PT 1 time a week for 3 additional weeks   for  gait and instability   Also for nursing consultation    [phone]

## 2017-08-24 ENCOUNTER — Telehealth: Payer: Self-pay | Admitting: *Deleted

## 2017-08-24 NOTE — Telephone Encounter (Signed)
"  Hello this is Servando Salina.  Thank you."

## 2017-08-25 ENCOUNTER — Ambulatory Visit: Payer: Medicare Other | Admitting: Podiatry

## 2017-08-25 ENCOUNTER — Telehealth: Payer: Self-pay | Admitting: Family Medicine

## 2017-08-25 ENCOUNTER — Ambulatory Visit: Payer: Self-pay | Admitting: *Deleted

## 2017-08-25 NOTE — Telephone Encounter (Signed)
I returned the call to Gaye Alken, wife on her cell number and got her voicemail.   I left a message for her to call us back at the office.   She was calling regarding Mr. Henery being constipated.

## 2017-08-25 NOTE — Telephone Encounter (Signed)
I called Billie who visited with Mr. Meenach this morning from [facility].  She gave me information that  The wife calls them frequently regarding her husband's constipation.   Billie encouraged the wife to bring this to the attention of the provider at the dialysis center today when he goes.  I let her know we  Have received  several calls from her regarding this same issue of constipation too.    I thanked Dalene Seltzer for her assistance.   I will return the call to Mr. Birchard wife.

## 2017-08-25 NOTE — Telephone Encounter (Signed)
I spoke with pt's wife, Nunzio Cory she states she is unsure why pt called and will not be back from dialysis until after 4:30pm. I offered to give Nunzio Cory the appt line number and she states she would rather pt called.

## 2017-08-28 ENCOUNTER — Other Ambulatory Visit: Payer: Self-pay

## 2017-08-28 MED ORDER — CLOPIDOGREL BISULFATE 75 MG PO TABS: 75.0000 mg | ORAL_TABLET | Freq: Every day | ORAL | 6 refills | Status: AC

## 2017-08-31 ENCOUNTER — Ambulatory Visit (INDEPENDENT_AMBULATORY_CARE_PROVIDER_SITE_OTHER): Payer: Medicare Other | Admitting: Podiatry

## 2017-08-31 DIAGNOSIS — B351 Tinea unguium: Secondary | ICD-10-CM | POA: Diagnosis not present

## 2017-08-31 DIAGNOSIS — I739 Peripheral vascular disease, unspecified: Secondary | ICD-10-CM | POA: Diagnosis not present

## 2017-08-31 DIAGNOSIS — E1151 Type 2 diabetes mellitus with diabetic peripheral angiopathy without gangrene: Secondary | ICD-10-CM | POA: Diagnosis not present

## 2017-08-31 NOTE — Progress Notes (Signed)
  Subjective:  Patient ID: Douglas Meyer, male    DOB: [birth year],  MRN: [id number]  Chief Complaint  Patient presents with  . Nail Problem    3 month debride   77 y.o. male returns for diabetic foot care.  Recent start dialysis.  Denies any issues  Objective:  There were no vitals filed for this visit. General AA&O x3. Normal mood and affect.  Vascular Dorsalis pedis pulses present 1+ bilaterally  Posterior tibial pulses absent bilaterally  Capillary refill normal to all digits. Pedal hair growth diminished.  Neurologic Epicritic sensation present bilaterally.  Dermatologic No open lesions. Interspaces clear of maceration.  Normal skin temperature and turgor. Hyperkeratotic lesions: none bilaterally. Nails: brittle, discoloration yellow, onychomycosis, thickening  Orthopedic: No history of amputation. MMT 5/5 in dorsiflexion, plantarflexion, inversion, and eversion. Normal lower extremity joint ROM without pain or crepitus.   Assessment & Plan:  Patient was evaluated and treated and all questions answered.  Diabetes with PAD, Onychomycosis -Educated on diabetic footcare. Diabetic risk level 0 -At risk foot care provided as below. -In office ABIs performed consistent with PAD.  Will refer for full noninvasive vascular studies  Procedure: Nail Debridement Rationale: Patient meets criteria for routine foot care due to class B findings Type of Debridement: manual, sharp debridement. Instrumentation: Nail nipper, rotary burr. Number of Nails: 10  Return in about 3 months (around 12/01/2017).

## 2017-09-01 ENCOUNTER — Telehealth: Payer: Self-pay | Admitting: *Deleted

## 2017-09-01 DIAGNOSIS — R0989 Other specified symptoms and signs involving the circulatory and respiratory systems: Secondary | ICD-10-CM

## 2017-09-01 NOTE — Telephone Encounter (Signed)
-----   Message from Evelina Bucy, DPM sent at 08/31/2017  6:13 PM EDT ----- Regarding: Vascular studies Can we refer for vascular studies. The usual arterials.

## 2017-09-01 NOTE — Telephone Encounter (Signed)
Orders faxed to [facility]. 

## 2017-09-05 ENCOUNTER — Telehealth: Payer: Self-pay | Admitting: Family Medicine

## 2017-09-05 NOTE — Telephone Encounter (Signed)
Copied from [place] [id number]. Topic: General - Other >> Sep 05, 2017 11:18 AM Margot Ables wrote: Reporting from PT. Pt fell Sunday 6:30pm night, tripped over bedside table, does not think he had cane with him. No injuries, all vitals stable. Also faxed last weeks session was missed due to other MD appts.

## 2017-09-05 NOTE — Telephone Encounter (Signed)
Copied from [place] [id number]. Topic: General - Other >> Sep 05, 2017 12:44 PM Margot Ables wrote: Pt taking blood sugar levels fasting and usually around 200-230 for past week. Pt has been using 21 units of lantus because he said one doctor told him to use 15 and another doctor told him to use 26. Please call to advise.

## 2017-09-05 NOTE — Telephone Encounter (Signed)
Spoke with pt confirmed that pt had a fall on Sunday, pt stated that he forgot his cane and tripped while walking, Pt is currently receiving physical therapy. Pt had a visit from the therapist and reports vitals signs were normal, pt states that there were no injuries. Advised pt to use his walker at all time to avoid more falls and to call our office if any need to.

## 2017-09-05 NOTE — Telephone Encounter (Signed)
Called pt requested for pt to call the office regarding his question on his insulin

## 2017-09-06 NOTE — Telephone Encounter (Signed)
Spoke with Douglas Meyer voiced understanding that our charts show that he should be currently taking 26 units of insulin at bedtime. Advised Douglas Meyer that Dr Sherren Mocha will clarify this with him when he comes on Tuesday for his appointment.

## 2017-09-07 ENCOUNTER — Ambulatory Visit: Payer: Medicare Other | Admitting: Orthotics

## 2017-09-07 ENCOUNTER — Inpatient Hospital Stay (HOSPITAL_COMMUNITY)
Admission: RE | Admit: 2017-09-07 | Payer: Medicare Other | Source: Ambulatory Visit | Attending: Podiatry | Admitting: Podiatry

## 2017-09-07 DIAGNOSIS — E1122 Type 2 diabetes mellitus with diabetic chronic kidney disease: Secondary | ICD-10-CM

## 2017-09-07 DIAGNOSIS — N185 Chronic kidney disease, stage 5: Secondary | ICD-10-CM

## 2017-09-07 DIAGNOSIS — I739 Peripheral vascular disease, unspecified: Secondary | ICD-10-CM

## 2017-09-07 DIAGNOSIS — R0989 Other specified symptoms and signs involving the circulatory and respiratory systems: Secondary | ICD-10-CM

## 2017-09-07 NOTE — Progress Notes (Signed)
Patient presents today for casting/measurments DBS per Dr. March Rummage; Patient has hx of Dm2, HAV, PAD PCP Deitering Chose A3200 10xw

## 2017-09-11 ENCOUNTER — Telehealth: Payer: Self-pay | Admitting: Family Medicine

## 2017-09-11 NOTE — Telephone Encounter (Signed)
Copied from [place] [id number]. Topic: General - Other >> Sep 11, 2017  9:47 AM Margot Ables wrote: Reason for CRM: pt has new sacral wound - pt is in a lot of pain - length 0.9cm x width 0.6cm x depth 0.4cm - no signs of infection at this point - She will have the wound care nurse look at the photos taken and see if she has recommendations. Pt doesn't want pain meds but wants to know what to do to heal it. It appears pt has been losing weight drastically and is now about 130 lbs with clothes on - requesting something to be prescribed medication to increase appetite or a supplement (whatever Dr. Sherren Mocha feels best). Pt has chronic diarrhea x a couple months as well.

## 2017-09-11 NOTE — Telephone Encounter (Signed)
Per Dr Sherren Mocha patient should call his nephrologist.  Patient's wife Tye Maryland is aware and agrees.

## 2017-09-12 ENCOUNTER — Ambulatory Visit: Payer: Medicare Other | Admitting: Family Medicine

## 2017-09-13 ENCOUNTER — Encounter: Payer: Self-pay | Admitting: Physician Assistant

## 2017-09-14 ENCOUNTER — Encounter (HOSPITAL_COMMUNITY): Payer: Medicare Other

## 2017-09-14 ENCOUNTER — Telehealth: Payer: Self-pay | Admitting: Family Medicine

## 2017-09-14 NOTE — Telephone Encounter (Signed)
Did pt have HD today? Has pt been drinking fluids?  If pt has not had his 2nd dose of metoprolol, it can be held.

## 2017-09-14 NOTE — Telephone Encounter (Signed)
Douglas Meyer with [facility] called to report BP is 90/62 - asymptomatic. Pt. Has no complaints.

## 2017-09-14 NOTE — Telephone Encounter (Signed)
This is dr Sherren Mocha pt currently on dialysis and is receiving care with [facility], pt is taking metoporol for high BP. [facility] reported pt BP is low please Advise.

## 2017-09-15 NOTE — Telephone Encounter (Signed)
Attempt # 2 to reach pt on his cell phone,left a message to return my call in the office.

## 2017-09-15 NOTE — Telephone Encounter (Signed)
Called pt left a message for pt to return my call back in the office.

## 2017-09-19 ENCOUNTER — Ambulatory Visit (HOSPITAL_COMMUNITY)
Admission: RE | Admit: 2017-09-19 | Payer: Medicare Other | Source: Ambulatory Visit | Attending: Podiatry | Admitting: Podiatry

## 2017-09-19 DIAGNOSIS — R0989 Other specified symptoms and signs involving the circulatory and respiratory systems: Secondary | ICD-10-CM

## 2017-09-19 NOTE — Telephone Encounter (Signed)
FYI

## 2017-09-19 NOTE — Telephone Encounter (Signed)
Spoke with pt states that he has been drinking plenty of fluids, pt wife stated that his blood pressure was back to normal and had his vitals taken at the Dialysis center 114/65 the reading on 09/18/2017. Pt state that the dialysis center is keeping up with his vitals and will notify our office if any need to.

## 2017-09-25 ENCOUNTER — Encounter (HOSPITAL_COMMUNITY): Payer: Self-pay

## 2017-09-25 ENCOUNTER — Other Ambulatory Visit: Payer: Self-pay

## 2017-09-25 ENCOUNTER — Inpatient Hospital Stay (HOSPITAL_COMMUNITY)
Admission: EM | Admit: 2017-09-25 | Discharge: 2017-10-04 | DRG: 871 | Disposition: E | Payer: Medicare Other | Attending: Internal Medicine | Admitting: Internal Medicine

## 2017-09-25 ENCOUNTER — Emergency Department (HOSPITAL_COMMUNITY): Payer: Medicare Other

## 2017-09-25 DIAGNOSIS — I10 Essential (primary) hypertension: Secondary | ICD-10-CM | POA: Diagnosis present

## 2017-09-25 DIAGNOSIS — Z6823 Body mass index (BMI) 23.0-23.9, adult: Secondary | ICD-10-CM

## 2017-09-25 DIAGNOSIS — Z86010 Personal history of colonic polyps: Secondary | ICD-10-CM | POA: Diagnosis not present

## 2017-09-25 DIAGNOSIS — Z515 Encounter for palliative care: Secondary | ICD-10-CM | POA: Diagnosis not present

## 2017-09-25 DIAGNOSIS — E1122 Type 2 diabetes mellitus with diabetic chronic kidney disease: Secondary | ICD-10-CM | POA: Diagnosis present

## 2017-09-25 DIAGNOSIS — E11649 Type 2 diabetes mellitus with hypoglycemia without coma: Secondary | ICD-10-CM | POA: Diagnosis not present

## 2017-09-25 DIAGNOSIS — K5641 Fecal impaction: Secondary | ICD-10-CM | POA: Diagnosis present

## 2017-09-25 DIAGNOSIS — I5022 Chronic systolic (congestive) heart failure: Secondary | ICD-10-CM | POA: Diagnosis present

## 2017-09-25 DIAGNOSIS — D62 Acute posthemorrhagic anemia: Secondary | ICD-10-CM | POA: Diagnosis not present

## 2017-09-25 DIAGNOSIS — K922 Gastrointestinal hemorrhage, unspecified: Secondary | ICD-10-CM | POA: Diagnosis not present

## 2017-09-25 DIAGNOSIS — N186 End stage renal disease: Secondary | ICD-10-CM

## 2017-09-25 DIAGNOSIS — K5900 Constipation, unspecified: Secondary | ICD-10-CM | POA: Diagnosis present

## 2017-09-25 DIAGNOSIS — A419 Sepsis, unspecified organism: Secondary | ICD-10-CM | POA: Diagnosis present

## 2017-09-25 DIAGNOSIS — N2581 Secondary hyperparathyroidism of renal origin: Secondary | ICD-10-CM | POA: Diagnosis present

## 2017-09-25 DIAGNOSIS — Z8249 Family history of ischemic heart disease and other diseases of the circulatory system: Secondary | ICD-10-CM

## 2017-09-25 DIAGNOSIS — K921 Melena: Secondary | ICD-10-CM

## 2017-09-25 DIAGNOSIS — L0390 Cellulitis, unspecified: Secondary | ICD-10-CM | POA: Diagnosis present

## 2017-09-25 DIAGNOSIS — E43 Unspecified severe protein-calorie malnutrition: Secondary | ICD-10-CM | POA: Diagnosis present

## 2017-09-25 DIAGNOSIS — M109 Gout, unspecified: Secondary | ICD-10-CM | POA: Diagnosis present

## 2017-09-25 DIAGNOSIS — K5289 Other specified noninfective gastroenteritis and colitis: Secondary | ICD-10-CM | POA: Diagnosis present

## 2017-09-25 DIAGNOSIS — E162 Hypoglycemia, unspecified: Secondary | ICD-10-CM | POA: Diagnosis not present

## 2017-09-25 DIAGNOSIS — I252 Old myocardial infarction: Secondary | ICD-10-CM

## 2017-09-25 DIAGNOSIS — B372 Candidiasis of skin and nail: Secondary | ICD-10-CM | POA: Diagnosis present

## 2017-09-25 DIAGNOSIS — I132 Hypertensive heart and chronic kidney disease with heart failure and with stage 5 chronic kidney disease, or end stage renal disease: Secondary | ICD-10-CM | POA: Diagnosis present

## 2017-09-25 DIAGNOSIS — D638 Anemia in other chronic diseases classified elsewhere: Secondary | ICD-10-CM | POA: Diagnosis present

## 2017-09-25 DIAGNOSIS — K5730 Diverticulosis of large intestine without perforation or abscess without bleeding: Secondary | ICD-10-CM | POA: Diagnosis present

## 2017-09-25 DIAGNOSIS — K626 Ulcer of anus and rectum: Secondary | ICD-10-CM | POA: Diagnosis not present

## 2017-09-25 DIAGNOSIS — K625 Hemorrhage of anus and rectum: Secondary | ICD-10-CM | POA: Diagnosis not present

## 2017-09-25 DIAGNOSIS — Z87891 Personal history of nicotine dependence: Secondary | ICD-10-CM

## 2017-09-25 DIAGNOSIS — E119 Type 2 diabetes mellitus without complications: Secondary | ICD-10-CM

## 2017-09-25 DIAGNOSIS — I6521 Occlusion and stenosis of right carotid artery: Secondary | ICD-10-CM | POA: Diagnosis present

## 2017-09-25 DIAGNOSIS — R6510 Systemic inflammatory response syndrome (SIRS) of non-infectious origin without acute organ dysfunction: Secondary | ICD-10-CM

## 2017-09-25 DIAGNOSIS — Z87442 Personal history of urinary calculi: Secondary | ICD-10-CM | POA: Diagnosis not present

## 2017-09-25 DIAGNOSIS — I469 Cardiac arrest, cause unspecified: Secondary | ICD-10-CM | POA: Diagnosis not present

## 2017-09-25 DIAGNOSIS — I2510 Atherosclerotic heart disease of native coronary artery without angina pectoris: Secondary | ICD-10-CM | POA: Diagnosis present

## 2017-09-25 DIAGNOSIS — L8990 Pressure ulcer of unspecified site, unspecified stage: Secondary | ICD-10-CM

## 2017-09-25 DIAGNOSIS — Z7982 Long term (current) use of aspirin: Secondary | ICD-10-CM | POA: Diagnosis not present

## 2017-09-25 DIAGNOSIS — Z992 Dependence on renal dialysis: Secondary | ICD-10-CM

## 2017-09-25 DIAGNOSIS — Z85828 Personal history of other malignant neoplasm of skin: Secondary | ICD-10-CM

## 2017-09-25 DIAGNOSIS — R531 Weakness: Secondary | ICD-10-CM

## 2017-09-25 DIAGNOSIS — E1151 Type 2 diabetes mellitus with diabetic peripheral angiopathy without gangrene: Secondary | ICD-10-CM | POA: Diagnosis present

## 2017-09-25 DIAGNOSIS — L89153 Pressure ulcer of sacral region, stage 3: Secondary | ICD-10-CM | POA: Diagnosis present

## 2017-09-25 DIAGNOSIS — R69 Illness, unspecified: Secondary | ICD-10-CM

## 2017-09-25 DIAGNOSIS — R627 Adult failure to thrive: Secondary | ICD-10-CM | POA: Diagnosis present

## 2017-09-25 DIAGNOSIS — Z9115 Patient's noncompliance with renal dialysis: Secondary | ICD-10-CM

## 2017-09-25 DIAGNOSIS — H903 Sensorineural hearing loss, bilateral: Secondary | ICD-10-CM | POA: Diagnosis present

## 2017-09-25 DIAGNOSIS — S31000A Unspecified open wound of lower back and pelvis without penetration into retroperitoneum, initial encounter: Secondary | ICD-10-CM

## 2017-09-25 DIAGNOSIS — E872 Acidosis: Secondary | ICD-10-CM | POA: Diagnosis present

## 2017-09-25 DIAGNOSIS — Z833 Family history of diabetes mellitus: Secondary | ICD-10-CM

## 2017-09-25 DIAGNOSIS — R578 Other shock: Secondary | ICD-10-CM | POA: Diagnosis not present

## 2017-09-25 DIAGNOSIS — Z7902 Long term (current) use of antithrombotics/antiplatelets: Secondary | ICD-10-CM

## 2017-09-25 DIAGNOSIS — D696 Thrombocytopenia, unspecified: Secondary | ICD-10-CM | POA: Diagnosis present

## 2017-09-25 DIAGNOSIS — K649 Unspecified hemorrhoids: Secondary | ICD-10-CM | POA: Diagnosis not present

## 2017-09-25 DIAGNOSIS — Z794 Long term (current) use of insulin: Secondary | ICD-10-CM

## 2017-09-25 DIAGNOSIS — I739 Peripheral vascular disease, unspecified: Secondary | ICD-10-CM | POA: Diagnosis present

## 2017-09-25 DIAGNOSIS — E785 Hyperlipidemia, unspecified: Secondary | ICD-10-CM | POA: Diagnosis present

## 2017-09-25 DIAGNOSIS — IMO0001 Reserved for inherently not codable concepts without codable children: Secondary | ICD-10-CM

## 2017-09-25 DIAGNOSIS — I214 Non-ST elevation (NSTEMI) myocardial infarction: Secondary | ICD-10-CM | POA: Diagnosis present

## 2017-09-25 HISTORY — DX: Dependence on renal dialysis: Z99.2

## 2017-09-25 HISTORY — DX: Dependence on renal dialysis: N18.6

## 2017-09-25 LAB — CBC
HCT: 44.4 % (ref 39.0–52.0)
HEMOGLOBIN: 15.1 g/dL (ref 13.0–17.0)
MCH: 32 pg (ref 26.0–34.0)
MCHC: 34 g/dL (ref 30.0–36.0)
MCV: 94.1 fL (ref 78.0–100.0)
PLATELETS: 107 10*3/uL — AB (ref 150–400)
RBC: 4.72 MIL/uL (ref 4.22–5.81)
RDW: 18.2 % — ABNORMAL HIGH (ref 11.5–15.5)
WBC: 7.8 10*3/uL (ref 4.0–10.5)

## 2017-09-25 LAB — I-STAT CG4 LACTIC ACID, ED
Lactic Acid, Venous: 7.58 mmol/L (ref 0.5–1.9)
Lactic Acid, Venous: 6.08 mmol/L (ref 0.5–1.9)

## 2017-09-25 LAB — BASIC METABOLIC PANEL
ANION GAP: 22 — AB (ref 5–15)
BUN: 47 mg/dL — ABNORMAL HIGH (ref 6–20)
CALCIUM: 9.2 mg/dL (ref 8.9–10.3)
CO2: 21 mmol/L — ABNORMAL LOW (ref 22–32)
Chloride: 90 mmol/L — ABNORMAL LOW (ref 101–111)
Creatinine, Ser: 5 mg/dL — ABNORMAL HIGH (ref 0.61–1.24)
GFR, EST AFRICAN AMERICAN: 12 mL/min — AB (ref >60)
GFR, EST NON AFRICAN AMERICAN: 10 mL/min — AB (ref >60)
Glucose, Bld: 351 mg/dL — ABNORMAL HIGH (ref 65–99)
Potassium: 4.7 mmol/L (ref 3.5–5.1)
SODIUM: 133 mmol/L — AB (ref 135–145)

## 2017-09-25 LAB — I-STAT TROPONIN, ED
TROPONIN I, POC: 0.25 ng/mL — AB (ref 0.00–0.08)
TROPONIN I, POC: 0.26 ng/mL — AB (ref 0.00–0.08)

## 2017-09-25 LAB — TROPONIN I: Troponin I: 0.03 ng/mL (ref <0.03)

## 2017-09-25 LAB — LACTIC ACID, PLASMA: LACTIC ACID, VENOUS: 2.7 mmol/L — AB (ref 0.5–1.9)

## 2017-09-25 LAB — HEMOGLOBIN A1C
Hgb A1c MFr Bld: 8.2 % — ABNORMAL HIGH (ref 4.8–5.6)
MEAN PLASMA GLUCOSE: 188.64 mg/dL

## 2017-09-25 LAB — CBG MONITORING, ED
GLUCOSE-CAPILLARY: 272 mg/dL — AB (ref 65–99)
GLUCOSE-CAPILLARY: 332 mg/dL — AB (ref 65–99)

## 2017-09-25 MED ORDER — ASPIRIN 81 MG PO CHEW
81.0000 mg | CHEWABLE_TABLET | Freq: Every day | ORAL | Status: DC
  Administered 2017-09-26: 81 mg via ORAL
  Filled 2017-09-25: qty 1

## 2017-09-25 MED ORDER — PIPERACILLIN-TAZOBACTAM 3.375 G IVPB
3.3750 g | Freq: Two times a day (BID) | INTRAVENOUS | Status: DC
  Administered 2017-09-26 – 2017-09-27 (×4): 3.375 g via INTRAVENOUS
  Filled 2017-09-25 (×6): qty 50

## 2017-09-25 MED ORDER — SODIUM CHLORIDE 0.9 % IV SOLN
Freq: Once | INTRAVENOUS | Status: AC
  Administered 2017-09-25: 16:00:00 via INTRAVENOUS

## 2017-09-25 MED ORDER — CLOPIDOGREL BISULFATE 75 MG PO TABS
75.0000 mg | ORAL_TABLET | Freq: Every day | ORAL | Status: DC
  Administered 2017-09-26: 75 mg via ORAL
  Filled 2017-09-25: qty 1

## 2017-09-25 MED ORDER — HYDROCORTISONE ACETATE 25 MG RE SUPP
25.0000 mg | Freq: Two times a day (BID) | RECTAL | Status: DC
  Administered 2017-09-25 – 2017-09-27 (×4): 25 mg via RECTAL
  Filled 2017-09-25 (×6): qty 1

## 2017-09-25 MED ORDER — NEPRO/CARBSTEADY PO LIQD
237.0000 mL | Freq: Three times a day (TID) | ORAL | Status: DC
  Administered 2017-09-26 (×3): 237 mL via ORAL
  Filled 2017-09-25 (×12): qty 237

## 2017-09-25 MED ORDER — BISACODYL 10 MG RE SUPP: 10.0000 mg | Freq: Every day | RECTAL | Status: DC | PRN

## 2017-09-25 MED ORDER — SENNOSIDES-DOCUSATE SODIUM 8.6-50 MG PO TABS
2.0000 | ORAL_TABLET | Freq: Every day | ORAL | Status: DC | PRN
  Filled 2017-09-25: qty 2

## 2017-09-25 MED ORDER — POLYETHYLENE GLYCOL 3350 17 G PO PACK
17.0000 g | PACK | Freq: Every day | ORAL | Status: DC | PRN
  Filled 2017-09-25: qty 1

## 2017-09-25 MED ORDER — INSULIN ASPART 100 UNIT/ML ~~LOC~~ SOLN
0.0000 [IU] | Freq: Three times a day (TID) | SUBCUTANEOUS | Status: DC
  Administered 2017-09-25: 5 [IU] via SUBCUTANEOUS
  Administered 2017-09-26: 1 [IU] via SUBCUTANEOUS
  Administered 2017-09-26 – 2017-09-27 (×2): 2 [IU] via SUBCUTANEOUS
  Filled 2017-09-25: qty 1

## 2017-09-25 MED ORDER — HEPARIN SODIUM (PORCINE) 5000 UNIT/ML IJ SOLN
5000.0000 [IU] | Freq: Three times a day (TID) | INTRAMUSCULAR | Status: DC
  Administered 2017-09-26 (×3): 5000 [IU] via SUBCUTANEOUS
  Filled 2017-09-25 (×3): qty 1

## 2017-09-25 MED ORDER — INSULIN GLARGINE 100 UNIT/ML ~~LOC~~ SOLN
26.0000 [IU] | Freq: Every day | SUBCUTANEOUS | Status: DC
  Filled 2017-09-25: qty 0.26

## 2017-09-25 MED ORDER — VANCOMYCIN HCL 10 G IV SOLR
1250.0000 mg | Freq: Once | INTRAVENOUS | Status: DC
  Filled 2017-09-25: qty 1250

## 2017-09-25 MED ORDER — SODIUM CHLORIDE 0.9 % IV BOLUS
500.0000 mL | Freq: Once | INTRAVENOUS | Status: AC
  Administered 2017-09-25: 500 mL via INTRAVENOUS

## 2017-09-25 MED ORDER — PIPERACILLIN-TAZOBACTAM 3.375 G IVPB 30 MIN
3.3750 g | Freq: Once | INTRAVENOUS | Status: AC
  Administered 2017-09-25: 3.375 g via INTRAVENOUS
  Filled 2017-09-25: qty 50

## 2017-09-25 MED ORDER — ONDANSETRON HCL 4 MG PO TABS: 4.0000 mg | ORAL_TABLET | Freq: Four times a day (QID) | ORAL | Status: DC | PRN

## 2017-09-25 MED ORDER — OMEGA-3-ACID ETHYL ESTERS 1 G PO CAPS
1000.0000 mg | ORAL_CAPSULE | Freq: Every day | ORAL | Status: DC
  Administered 2017-09-25 – 2017-09-26 (×2): 1000 mg via ORAL
  Filled 2017-09-25 (×3): qty 1

## 2017-09-25 MED ORDER — ACETAMINOPHEN 650 MG RE SUPP: 650.0000 mg | Freq: Four times a day (QID) | RECTAL | Status: DC | PRN

## 2017-09-25 MED ORDER — ONDANSETRON HCL 4 MG/2ML IJ SOLN: 4.0000 mg | Freq: Four times a day (QID) | INTRAMUSCULAR | Status: DC | PRN

## 2017-09-25 MED ORDER — SODIUM CHLORIDE 0.9 % IV BOLUS
500.0000 mL | Freq: Once | INTRAVENOUS | Status: AC
  Administered 2017-09-25: 250 mL via INTRAVENOUS

## 2017-09-25 MED ORDER — ACETAMINOPHEN 325 MG PO TABS
650.0000 mg | ORAL_TABLET | Freq: Four times a day (QID) | ORAL | Status: DC | PRN
  Administered 2017-09-26: 650 mg via ORAL
  Filled 2017-09-25: qty 2

## 2017-09-25 MED ORDER — ALLOPURINOL 300 MG PO TABS
300.0000 mg | ORAL_TABLET | Freq: Every day | ORAL | Status: DC
  Administered 2017-09-26: 300 mg via ORAL
  Filled 2017-09-25 (×2): qty 1

## 2017-09-25 MED ORDER — CALCIUM ACETATE (PHOS BINDER) 667 MG PO CAPS
667.0000 mg | ORAL_CAPSULE | Freq: Three times a day (TID) | ORAL | Status: DC
  Administered 2017-09-25 – 2017-09-26 (×4): 667 mg via ORAL
  Filled 2017-09-25 (×8): qty 1

## 2017-09-25 MED ORDER — SODIUM CHLORIDE 0.9 % IV BOLUS: 250.0000 mL | Freq: Once | INTRAVENOUS | Status: DC

## 2017-09-25 MED ORDER — ATORVASTATIN CALCIUM 20 MG PO TABS
20.0000 mg | ORAL_TABLET | Freq: Every day | ORAL | Status: DC
  Administered 2017-09-25 – 2017-09-26 (×2): 20 mg via ORAL
  Filled 2017-09-25 (×3): qty 1

## 2017-09-25 MED ORDER — INSULIN GLARGINE 100 UNIT/ML ~~LOC~~ SOLN
26.0000 [IU] | Freq: Every day | SUBCUTANEOUS | Status: DC
  Administered 2017-09-26: 26 [IU] via SUBCUTANEOUS
  Filled 2017-09-25 (×2): qty 0.26

## 2017-09-25 MED ORDER — VANCOMYCIN HCL 10 G IV SOLR
1500.0000 mg | Freq: Once | INTRAVENOUS | Status: AC
  Administered 2017-09-25: 1500 mg via INTRAVENOUS
  Filled 2017-09-25: qty 1500

## 2017-09-25 NOTE — ED Provider Notes (Addendum)
[place] EMERGENCY DEPARTMENT Provider Note   CSN: [id number] Arrival date & time: 09/09/2017  1330     History   Chief Complaint Chief Complaint  Patient presents with  . Hypotension    HPI Douglas Meyer is a 77 y.o. male.  HPI Patient's history is significant for Monday Wednesday Friday dialysis.  Patient has known coronary artery disease and diabetes.  He has been getting increasingly weak over the past several days.  He reports as of today he was so weak he could not get out of bed to go to dialysis.  He felt exhausted.  He has not had a fever.  No specific cough or chest pain.  He does have a wound on his sacrum.  He reports sometimes it heals up with another times it becomes very painful and opens up.  He has been having problems with fecal incontinence and rectal pain. Past Medical History:  Diagnosis Date  . Bilateral carotid artery disease ([place])   . CAD (coronary artery disease) 05/15/10   out of hospital myocardial infarction in the 1990's. this was picked up on an  EKG in 1997.  Cardiac catherterization demonstrated an occluded vessel and collateras.  I have no description of this.  A stress  perfusion study  done 2008 demonstrated an ejection fraction  of  53% with inferolateral  hypokinesis.  There was an inferolateral defect with  scar and some mild mixed ischemia.   managed rx  . Cancer ([place])    skin  . CKD (chronic kidney disease), stage IV ([place])   . Diabetes mellitus    DM, type II  . ED (erectile dysfunction)   . Gout   . History of kidney stones   . Hx of cardiovascular stress test    a. Lexiscan myoview 10/18/12: EF 53%, inf and inf-lat scar, slight peri-infarct ischemia, small area of apical ischemia; no change from 2006  . Hyperlipidemia   . Hypertension   . Myocardial infarction ([place])   . Nephrolithiasis   . Polyp of colon   . PVD (peripheral vascular disease) [facility])     Patient Active Problem List   Diagnosis Date Noted  . Wound  of sacral region 09/26/2017  . Fecal impaction ([place])   . Constipation 08/17/2017  . End stage renal failure on dialysis ([place]) 08/17/2017  . Anemia of chronic disease 08/17/2017  . Genital candidiasis in male 08/17/2017  . Lactic acidosis 08/17/2017  . Hyperkalemia 08/17/2017  . Acute systolic (congestive) heart failure ([place])   . Coronary artery disease involving native coronary artery of native heart without angina pectoris   . Elevated troponin   . Renal failure (ARF), acute on chronic (HCC) 07/13/2017  . Generalized weakness 07/12/2017  . Sensorineural hearing loss (SNHL) of both ears 01/10/2017  . Asymptomatic stenosis of right carotid artery 11/28/2014  . Carotid bruit 09/22/2014  . Onychomycosis 10/09/2012  . Gout 10/02/2008  . ERECTILE DYSFUNCTION 08/16/2007  . Non-ST elevation (NSTEMI) myocardial infarction ([place]) 08/16/2007  . NEPHROLITHIASIS, HX OF 08/16/2007  . POLYP, COLON 03/29/2007  . Insulin dependent diabetes mellitus ([place]) 02/01/2007  . Hyperlipemia 02/01/2007  . Essential hypertension 02/01/2007  . Coronary atherosclerosis 02/01/2007  . Peripheral vascular disease ([place]) 02/01/2007  . Disorder resulting from impaired renal function 02/01/2007    Past Surgical History:  Procedure Laterality Date  . AV FISTULA PLACEMENT Right 06/19/2015   Procedure: ARTERIOVENOUS (AV) FISTULA CREATION;  Surgeon: Serafina Mitchell, MD;  Location: [place];  Service: Vascular;  Laterality: Right;  . BASCILIC VEIN TRANSPOSITION Left 08/11/2016   Procedure: BASCILIC VEIN TRANSPOSITION-LEFT 1ST STAGE;  Surgeon: Serafina Mitchell, MD;  Location: [place];  Service: Vascular;  Laterality: Left;  . BASCILIC VEIN TRANSPOSITION Left 10/21/2016   Procedure: LEFT ARM 2ND STAGE BASILIC VEIN TRANSPOSITION;  Surgeon: Serafina Mitchell, MD;  Location: [facility];  Service: Vascular;  Laterality: Left;  . COLONOSCOPY W/ POLYPECTOMY    . CYSTOSCOPY     stone removal x 1  . ENDARTERECTOMY Right 11/28/2014   Procedure:  RIGHT CAROTID ENDARTERECTOMY;  Surgeon: Serafina Mitchell, MD;  Location: [place];  Service: Vascular;  Laterality: Right;  . EYE SURGERY  06/2013   bilateral cataract surgery  . HUMERUS FRACTURE SURGERY     left  . MOHS SURGERY     multiple  . PATCH ANGIOPLASTY Right 11/28/2014   Procedure: PATCH ANGIOPLASTY RIGHT CAROTID;  Surgeon: Serafina Mitchell, MD;  Location: [place];  Service: Vascular;  Laterality: Right;  . RIGHT/LEFT HEART CATH AND CORONARY ANGIOGRAPHY N/A 07/17/2017   Procedure: RIGHT/LEFT HEART CATH AND CORONARY ANGIOGRAPHY;  Surgeon: Jettie Booze, MD;  Location: [place] [facility];  Service: Cardiovascular;  Laterality: N/A;        Home Medications    Prior to Admission medications   Medication Sig Start Date End Date Taking? Authorizing Provider  allopurinol (ZYLOPRIM) 300 MG tablet TAKE ONE TABLET BY MOUTH ONCE DAILY 06/07/17   Dorena Cookey, MD  Ascorbic Acid (VITAMIN C) 500 MG tablet Take 500 mg by mouth at bedtime.     [provider]  aspirin 81 MG chewable tablet Chew 1 tablet (81 mg total) by mouth daily. 07/20/17   Raiford Noble Latif, DO  atorvastatin (LIPITOR) 20 MG tablet TAKE ONE TABLET BY MOUTH ONCE DAILY AT  6  PM 05/12/17   Dorena Cookey, MD  BD PEN NEEDLE NANO U/F 32G X 4 MM MISC  USE AS DIRECTED DAILY 04/25/17   Dorena Cookey, MD  calcium acetate (PHOSLO) 667 MG capsule Take 1 capsule (667 mg total) by mouth 3 (three) times daily with meals. 07/19/17   Raiford Noble Latif, DO  clopidogrel (PLAVIX) 75 MG tablet Take 1 tablet (75 mg total) by mouth daily. 08/28/17   Minus Breeding, MD  hydrocortisone (ANUSOL-HC) 25 MG suppository Place 1 suppository (25 mg total) rectally 2 (two) times daily. 08/18/17   Rosita Fire, MD  insulin glargine (LANTUS) 100 unit/mL SOPN Inject 26 Units into the skin at bedtime.     [provider]  Lancets (ACCU-CHEK MULTICLIX) lancets 1 each by Other route daily. Dx 250.01 05/07/13   Dorena Cookey, MD    metoprolol succinate (TOPROL-XL) 25 MG 24 hr tablet Take 25 mg by mouth as directed. 1 tablet twice daily on NON Dialysis days [facility]. Sun 1 tablet ONCE daily on all other days (T, TH, Sat)    [provider]  Multiple Vitamins-Minerals (MULTIVITAMIN WITH MINERALS) tablet Take 1 tablet by mouth at bedtime.     [provider]  nitroGLYCERIN (NITROSTAT) 0.4 MG SL tablet Place 1 tablet (0.4 mg total) under the tongue every 5 (five) minutes as needed for up to 25 days for chest pain. 07/28/17 08/22/17  Barrett, Evelene Croon, PA-C  Nutritional Supplements (FEEDING SUPPLEMENT, NEPRO CARB STEADY,) LIQD Take 237 mLs by mouth 3 (three) times daily between meals. 07/19/17   Kerney Elbe, DO  Omega-3 Fatty  Acids (FISH OIL) 500 MG CAPS Take 500 mg by mouth at bedtime.     [provider]  polyethylene glycol (MIRALAX / GLYCOLAX) packet Take 17 g by mouth daily as needed for moderate constipation. 08/18/17   Rosita Fire, MD  senna-docusate (SENOKOT-S) 8.6-50 MG tablet Take 2 tablets by mouth daily as needed for mild constipation. 08/18/17   Rosita Fire, MD    Family History Family History  Problem Relation Age of Onset  . Heart attack Father        deceased 44  . Diabetes Brother        rheumatic fever, peptic ulcer disease, deceased  . Diabetes Sister        deceased 60  . Heart attack Mother        deceased age 15s  . Diabetes type II Brother   . Diabetes type II Brother     Social History Social History   Tobacco Use  . Smoking status: Former Smoker    Years: 6.00    Last attempt to quit: 06/06/1973    Years since quitting: 44.3  . Smokeless tobacco: Never Used  Substance Use Topics  . Alcohol use: Yes    Alcohol/week: 4.2 oz    Types: 7 Cans of beer per week    Comment: 1 beer daily  . Drug use: No     Allergies   Patient has no known allergies.   Review of Systems Review of Systems 10 Systems reviewed and are negative for acute  change except as noted in the HPI.   Physical Exam Updated Vital Signs BP 95/63   Pulse 73   Temp (!) 97.3 F (36.3 C) (Oral)   Resp (!) 21   SpO2 97%   Physical Exam Constitutional: Patient is alert and appropriate.  He is giving history.  No respiratory distress.  [place] pale in appearance. HEENT.  Normocephalic atraumatic.  Airway patent.  Mucous membranes slightly dry. Heart is regular.  No gross rub murmur gallop. Lungs: Grossly clear.  No gross wheeze or rhonchi.  Breath sounds throughout. Abdomen soft.  Nondistended. Rectal: Patient has a soft, sticky stool in the vault.  He has some incontinence of thick, green-brown stool.  No blood. Sacrum has diffuse erythema consistent with pressure wound.  There is a less than 1 cm open wound overlying the coccyx area.  The area is contaminated with stool. Lower extremities: 1+ pitting edema.  Calves nontender. Neurologically: Patient is alert and appropriate.  He follows commands appropriately.  Speech is clear. Skin warm and dry slightly pale ED Treatments / Results  Labs (all labs ordered are listed, but only abnormal results are displayed) Labs Reviewed  BASIC METABOLIC PANEL - Abnormal; Notable for the following components:      Result Value   Sodium 133 (*)    Chloride 90 (*)    CO2 21 (*)    Glucose, Bld 351 (*)    BUN 47 (*)    Creatinine, Ser 5.00 (*)    GFR calc non Af Amer 10 (*)    GFR calc Af Amer 12 (*)    Anion gap 22 (*)    All other components within normal limits  CBC - Abnormal; Notable for the following components:   RDW 18.2 (*)    Platelets 107 (*)    All other components within normal limits  CBG MONITORING, ED - Abnormal; Notable for the following components:   Glucose-Capillary 332 (*)  All other components within normal limits  I-STAT CG4 LACTIC ACID, ED - Abnormal; Notable for the following components:   Lactic Acid, Venous 7.58 (*)    All other components within normal limits  I-STAT TROPONIN,  ED - Abnormal; Notable for the following components:   Troponin i, poc 0.25 (*)    All other components within normal limits  I-STAT CG4 LACTIC ACID, ED - Abnormal; Notable for the following components:   Lactic Acid, Venous 6.08 (*)    All other components within normal limits  I-STAT TROPONIN, ED - Abnormal; Notable for the following components:   Troponin i, poc 0.26 (*)    All other components within normal limits  CULTURE, BLOOD (ROUTINE X 2)  CULTURE, BLOOD (ROUTINE X 2)  URINALYSIS, ROUTINE W REFLEX MICROSCOPIC  LIPASE, BLOOD  D-DIMER, QUANTITATIVE (NOT AT [facility])  PROTIME-INR  DIFFERENTIAL  HEPATIC FUNCTION PANEL  POC OCCULT BLOOD, ED    EKG EKG Interpretation  Date/Time:  Monday September 25 2017 14:52:46 EDT Ventricular Rate:  75 PR Interval:    QRS Duration: 101 QT Interval:  411 QTC Calculation: 460 R Axis:   61 Text Interpretation:  Sinus rhythm Probable left atrial enlargement LVH with secondary repolarization abnormality Probable inferior infarct, age indeterminate agree. no sig change from previous Confirmed by Charlesetta Shanks [phone]) on 09/16/2017 4:09:21 PM   Radiology Dg Abdomen 1 View  Result Date: 09/24/2017 CLINICAL DATA:  Weakness, nonproductive cough for the past 2-3 days. History of coronary artery disease. EXAM: ABDOMEN - 1 VIEW COMPARISON:  Supine abdominal radiograph of August 18, 2017 FINDINGS: The bowel gas pattern within is within the limits of normal. The colonic stool burden is normal. There is mild distention of the rectum with stool. There are vascular calcifications present. The lumbar spine exhibits multilevel degenerative change. IMPRESSION: Persistent increased stool burden in the rectum worse worrisome for fecal impaction. No bowel obstruction or ileus. Electronically Signed   By: David  Martinique M.D.   On: 09/15/2017 14:18   Dg Chest Port 1 View  Result Date: 09/23/2017 CLINICAL DATA:  Cough and weakness for 2-3 days. EXAM: PORTABLE CHEST 1 VIEW  COMPARISON:  08/17/2017 FINDINGS: Cardiac enlargement. Aortic atherosclerosis identified. There are bilateral pleural effusions identified right greater in left. No interstitial edema or airspace opacities. IMPRESSION: 1. Cardiac enlargement and bilateral pleural effusions. 2.  Aortic Atherosclerosis (ICD10-I70.0). Electronically Signed   By: Kerby Moors M.D.   On: 09/22/2017 14:19    Procedures Procedures (including critical care time) CRITICAL CARE Performed by: Si Gaul   Total critical care time: 30 minutes  Critical care time was exclusive of separately billable procedures and treating other patients.  Critical care was necessary to treat or prevent imminent or life-threatening deterioration.  Critical care was time spent personally by me on the following activities: development of treatment plan with patient and/or surrogate as well as nursing, discussions with consultants, evaluation of patient's response to treatment, examination of patient, obtaining history from patient or surrogate, ordering and performing treatments and interventions, ordering and review of laboratory studies, ordering and review of radiographic studies, pulse oximetry and re-evaluation of patient's condition. Medications Ordered in ED Medications  vancomycin (VANCOCIN) 1,500 mg in sodium chloride 0.9 % 500 mL IVPB (1,500 mg Intravenous New Bag/Given 09/05/2017 1600)  sodium chloride 0.9 % bolus 500 mL (0 mLs Intravenous Stopped 09/13/2017 1540)  0.9 %  sodium chloride infusion ( Intravenous New Bag/Given 09/15/2017 1541)  piperacillin-tazobactam (ZOSYN) IVPB 3.375 g (3.375 g  Intravenous New Bag/Given 09/29/2017 1541)     Initial Impression / Assessment and Plan / ED Course  I have reviewed the triage vital signs and the nursing notes.  Pertinent labs & imaging results that were available during my care of the patient were reviewed by me and considered in my medical decision making (see chart for  details).  Clinical Course as of Sep 26 1611  Mon Sep 25, 2017  [place] with Dr. Florene Glen nephrology.  Will not plan for dialysis tonight unless patient develops objective overload.  Will do consult for patient.   [MP]    Clinical Course User Index [MP] Charlesetta Shanks, MD    Consult: [facility] hospitalist for admission. Consult: Nephrology Dr. Florene Glen will see in consult. Final Clinical Impressions(s) / ED Diagnoses   Final diagnoses:  Wound of sacral region, initial encounter  SIRS (systemic inflammatory response syndrome) (HCC)  Severe comorbid illness  End stage renal failure on dialysis [facility])  Fecal impaction [facility])  Patient presents as outlined above.  He has severe comorbid illness of renal failure on dialysis.  He has had increasing weakness and declining general condition.  Patient does have sacral ulcer and pressure wound that appears to get exposed to fecal material from stool incontinence.  At this time will treat for infection.  This is most likely source for patient's acute symptoms.  No Chest pain, cough or fever.  No abdominal pain.  No vomiting.  Nephrology consulted.  At this time patient is not in acute volume overload.  Since saturation is 100% on room air.  Patient shows no signs of dyspnea.  Nephrology will await dialysis.  Plan to admit to hospitalist service for IV antibiotics and monitoring.  ED Discharge Orders    None       Charlesetta Shanks, MD 09/24/2017 8315    Charlesetta Shanks, MD 09/24/2017 [phone]

## 2017-09-25 NOTE — ED Triage Notes (Signed)
[facility]- pt coming from home with complaint of generalized fatigue. Pt is MWF dialysis pt and was unable to go today due to weakness. Pt has also been having intermittent diarrhea since February. Last BP 110/78, initially 88/59. Pt alert and oriented. Pt weak upon standing which is abnormal for him.

## 2017-09-25 NOTE — ED Notes (Addendum)
Pharmacy unable to verify home medications per Benjamine Mola due to patient beign unable to confirm last does. Pharmacy states that when this occurs, all home medications will need to start tomorrow.

## 2017-09-25 NOTE — Progress Notes (Signed)
Pharmacy Antibiotic Note  Douglas Meyer is a 77 y.o. male admitted on 09/18/2017 with cellulitis.  Pharmacy has been consulted for vancomycin dosing. ESRD on HD MWF. Unable to go today due to weakness.  Plan: Vancomycin IV 1500 mg x 1; then, 750 mg IV qHD (not yet entered, f/u schedule) Zosyn 3.375 g 30 min infusion x 1 F/u inpatient HD intolerance for vancomycin maintenance dosing Need to con't Zosyn??    Temp (24hrs), Avg:97.3 F (36.3 C), Min:97.3 F (36.3 C), Max:97.3 F (36.3 C)  Recent Labs  Lab 09/17/2017 1340  WBC 7.8  CREATININE 5.00*    CrCl cannot be calculated (Unknown ideal weight.).    No Known Allergies  Antimicrobials this admission: 4/22 Vancomycin x 1 4/22 Zosyn x 1  Dose adjustments this admission:  Microbiology results:  Thank you for allowing pharmacy to be a part of this patient's care.  Julieta Bellini, PharmD Student 09/10/2017 2:21 PM

## 2017-09-25 NOTE — ED Notes (Signed)
Patient cleaned and repositioned.

## 2017-09-25 NOTE — Progress Notes (Signed)
Patient was seen, examined,treatment plan was discussed with the Advance Practice Provider.  I have personally reviewed the clinical findings, labs, EKG, imaging studies and management of this patient in detail. I have also reviewed the orders written for this patient which were under my direction. I agree with the documentation, as recorded by the Advance Practice Provider.   Douglas Meyer is a 77 y.o. male with a Past Medical History of PVD; CAD; HTN; HLD; DM; and ESRD on HD since 2/19 who presents with progressive weakness and lethargy.  He was too weak to go to HD today.  Family does report fecal incontinence and rectal pain.  On exam, the patient is fatigued but he did brighten up a bit and try to tell some jokes.   He has a sacral ulceration that is not current stageable, possibly stage 3.  There is surrounding erythema concerning for cellulitis.   Sacral pressure ulcer with surrounding cellulitis -Wound care consult -He was given Vanc and Zosyn in the ER; will continue, particularly since he is undergoing HD and at increased risk for healthcare associated infection -Hypotension and lactic acidosis without other evidence of sepsis - suspect that this is related to volume deficiency in the setting of sepsis in a patient who has end stage renal failure; small volume boluses prn hypotension (250 cc) for now and trend lactate  ESRD on HD -Patient has not tolerated HD well so far -This may be related to the ulcer, but he also may not be a great candidate for HD -Consider palliative care consultation -Nephrology consult is pending   Karmen Bongo, MD

## 2017-09-25 NOTE — ED Notes (Signed)
EDp at bedside. O2 Sensor moved. Patients fingers cold and not picking up on oxygen.

## 2017-09-25 NOTE — H&P (Signed)
History and Physical    Douglas Meyer ZOX:[id number] DOB: [birth year] DOA: 09/15/2017   PCP: Dorena Cookey, MD   Patient coming from:  Home    Chief Complaint: generalized weakness   HPI: Douglas Meyer is a 77 y.o. male with medical history significant for ESRD on hemodialysis Monday Wednesday and Friday, known coronary artery disease, diabetes, monitor, complex medical history.  The patient has been getting increasingly weak over the last several days, reporting today to be so weak that he could not get out of the bed to go to dialysis.  He has significant fatigue.  He denies any fever, cough or chest pain.  Of note, the patient does report having a wound on his sacrum for the last 6 months, recently becoming more erythematosus, and reports that at times he heals and then becomes very painful, opening up, at this time is chest rate and uncomfortable.  He denies any nausea or vomiting.  He has history of fecal incontinence, and hemorrhoids.  He has chronic lower extremity swelling, not worsening.  Of note, the patient does have cool extremities, which are chronic.  No acute mental status changes, denies any headaches, or vision changes.  Patient does not make any urine.   ED Course:  BP 95/63   Pulse 73   Temp (!) 97.3 F (36.3 C) (Oral)   Resp (!) 21   SpO2 97%   Sinus rhythm Probable left atrial enlargement LVH with secondary repolarization abnormality Probable inferior infarct, age indeterminate  Last 2 D echo 07/2017 EF 25 percent,   CXR Cardiac enlargement and bilateral pleural effusions Abd XR fecal impaction  Lactic acid on presentation was 7.58, later repeat 6.08. Troponin was 0.26, likely reactive. Cultures are pending. White count is 7.8 Hemoglobin normal at 515.1, platelets 107, chronic thrombocytopenia.    Review of Systems:  As per HPI otherwise all other systems reviewed and are negative  Past Medical History:  Diagnosis Date  . Bilateral carotid artery disease  ([place])   . CAD (coronary artery disease) 05/15/10   out of hospital myocardial infarction in the 1990's. this was picked up on an  EKG in 1997.  Cardiac catherterization demonstrated an occluded vessel and collateras.  I have no description of this.  A stress  perfusion study  done 2008 demonstrated an ejection fraction  of  53% with inferolateral  hypokinesis.  There was an inferolateral defect with  scar and some mild mixed ischemia.   managed rx  . Cancer ([place])    skin  . CKD (chronic kidney disease), stage IV ([place])   . Diabetes mellitus    DM, type II  . ED (erectile dysfunction)   . Gout   . History of kidney stones   . Hx of cardiovascular stress test    a. Lexiscan myoview 10/18/12: EF 53%, inf and inf-lat scar, slight peri-infarct ischemia, small area of apical ischemia; no change from 2006  . Hyperlipidemia   . Hypertension   . Myocardial infarction ([place])   . Nephrolithiasis   . Polyp of colon   . PVD (peripheral vascular disease) ([place])     Past Surgical History:  Procedure Laterality Date  . AV FISTULA PLACEMENT Right 06/19/2015   Procedure: ARTERIOVENOUS (AV) FISTULA CREATION;  Surgeon: Serafina Mitchell, MD;  Location: [place];  Service: Vascular;  Laterality: Right;  . BASCILIC VEIN TRANSPOSITION Left 08/11/2016   Procedure: BASCILIC VEIN TRANSPOSITION-LEFT 1ST STAGE;  Surgeon: Serafina Mitchell, MD;  Location: [place];  Service: Vascular;  Laterality: Left;  . BASCILIC VEIN TRANSPOSITION Left 10/21/2016   Procedure: LEFT ARM 2ND STAGE BASILIC VEIN TRANSPOSITION;  Surgeon: Serafina Mitchell, MD;  Location: [facility];  Service: Vascular;  Laterality: Left;  . COLONOSCOPY W/ POLYPECTOMY    . CYSTOSCOPY     stone removal x 1  . ENDARTERECTOMY Right 11/28/2014   Procedure: RIGHT CAROTID ENDARTERECTOMY;  Surgeon: Serafina Mitchell, MD;  Location: [place];  Service: Vascular;  Laterality: Right;  . EYE SURGERY  06/2013   bilateral cataract surgery  . HUMERUS FRACTURE SURGERY     left  . MOHS  SURGERY     multiple  . PATCH ANGIOPLASTY Right 11/28/2014   Procedure: PATCH ANGIOPLASTY RIGHT CAROTID;  Surgeon: Serafina Mitchell, MD;  Location: [place];  Service: Vascular;  Laterality: Right;  . RIGHT/LEFT HEART CATH AND CORONARY ANGIOGRAPHY N/A 07/17/2017   Procedure: RIGHT/LEFT HEART CATH AND CORONARY ANGIOGRAPHY;  Surgeon: Jettie Booze, MD;  Location: [place] [facility];  Service: Cardiovascular;  Laterality: N/A;    Social History Social History   Socioeconomic History  . Marital status: Married    Spouse name: Not on file  . Number of children: 4  . Years of education: Not on file  . Highest education level: Not on file  Occupational History    Employer: [employer]    Comment: retired    Fish farm manager: RETIRED  Social Needs  . Financial resource strain: Not on file  . Food insecurity:    Worry: Not on file    Inability: Not on file  . Transportation needs:    Medical: Not on file    Non-medical: Not on file  Tobacco Use  . Smoking status: Former Smoker    Years: 6.00    Last attempt to quit: 06/06/1973    Years since quitting: 44.3  . Smokeless tobacco: Never Used  Substance and Sexual Activity  . Alcohol use: Yes    Alcohol/week: 4.2 oz    Types: 7 Cans of beer per week    Comment: 1 beer daily  . Drug use: No  . Sexual activity: Not on file  Lifestyle  . Physical activity:    Days per week: Not on file    Minutes per session: Not on file  . Stress: Not on file  Relationships  . Social connections:    Talks on phone: Not on file    Gets together: Not on file    Attends religious service: Not on file    Active member of club or organization: Not on file    Attends meetings of clubs or organizations: Not on file    Relationship status: Not on file  . Intimate partner violence:    Fear of current or ex partner: Not on file    Emotionally abused: Not on file    Physically abused: Not on file    Forced sexual activity: Not on file  Other Topics Concern  .  Not on file  Social History Narrative   Lives with wife.       No Known Allergies  Family History  Problem Relation Age of Onset  . Heart attack Father        deceased 39  . Diabetes Brother        rheumatic fever, peptic ulcer disease, deceased  . Diabetes Sister        deceased 62  . Heart attack Mother  deceased age 88s  . Diabetes type II Brother   . Diabetes type II Brother       Prior to Admission medications   Medication Sig Start Date End Date Taking? Authorizing Provider  allopurinol (ZYLOPRIM) 300 MG tablet TAKE ONE TABLET BY MOUTH ONCE DAILY 06/07/17   Dorena Cookey, MD  Ascorbic Acid (VITAMIN C) 500 MG tablet Take 500 mg by mouth at bedtime.     [provider]  aspirin 81 MG chewable tablet Chew 1 tablet (81 mg total) by mouth daily. 07/20/17   Raiford Noble Latif, DO  atorvastatin (LIPITOR) 20 MG tablet TAKE ONE TABLET BY MOUTH ONCE DAILY AT  6  PM 05/12/17   Dorena Cookey, MD  BD PEN NEEDLE NANO U/F 32G X 4 MM MISC  USE AS DIRECTED DAILY 04/25/17   Dorena Cookey, MD  calcium acetate (PHOSLO) 667 MG capsule Take 1 capsule (667 mg total) by mouth 3 (three) times daily with meals. 07/19/17   Raiford Noble Latif, DO  clopidogrel (PLAVIX) 75 MG tablet Take 1 tablet (75 mg total) by mouth daily. 08/28/17   Minus Breeding, MD  hydrocortisone (ANUSOL-HC) 25 MG suppository Place 1 suppository (25 mg total) rectally 2 (two) times daily. 08/18/17   Rosita Fire, MD  insulin glargine (LANTUS) 100 unit/mL SOPN Inject 26 Units into the skin at bedtime.     [provider]  Lancets (ACCU-CHEK MULTICLIX) lancets 1 each by Other route daily. Dx 250.01 05/07/13   Dorena Cookey, MD  metoprolol succinate (TOPROL-XL) 25 MG 24 hr tablet Take 25 mg by mouth as directed. 1 tablet twice daily on NON Dialysis days [facility]. [facility]. Sun 1 tablet ONCE daily on all other days (T, TH, Sat)    [provider]  Multiple Vitamins-Minerals (MULTIVITAMIN WITH  MINERALS) tablet Take 1 tablet by mouth at bedtime.     [provider]  nitroGLYCERIN (NITROSTAT) 0.4 MG SL tablet Place 1 tablet (0.4 mg total) under the tongue every 5 (five) minutes as needed for up to 25 days for chest pain. 07/28/17 08/22/17  Barrett, Evelene Croon, PA-C  Nutritional Supplements (FEEDING SUPPLEMENT, NEPRO CARB STEADY,) LIQD Take 237 mLs by mouth 3 (three) times daily between meals. 07/19/17   Sheikh, Georgina Quint Latif, DO  Omega-3 Fatty Acids (FISH OIL) 500 MG CAPS Take 500 mg by mouth at bedtime.     [provider]  polyethylene glycol (MIRALAX / GLYCOLAX) packet Take 17 g by mouth daily as needed for moderate constipation. 08/18/17   Rosita Fire, MD  senna-docusate (SENOKOT-S) 8.6-50 MG tablet Take 2 tablets by mouth daily as needed for mild constipation. 08/18/17   Rosita Fire, MD    Physical Exam:  Vitals:   09/07/2017 1545 09/08/2017 1554 09/20/2017 1556 09/04/2017 1600  BP: (!) 90/59   95/63  Pulse:    73  Resp: 19   (!) 21  Temp:      TempSrc:      SpO2:  97% 97% 97%   Constitutional: NAD, calm, chronically ill appearing.   Eyes: PERRL, lids and conjunctivae normal ENMT: Mucous membranes are moist, without exudate or lesions  Neck: normal, supple, no masses, no thyromegaly Respiratory: clear to auscultation bilaterally, no wheezing, no crackles. Normal respiratory effort  Cardiovascular: Regular rate and rhythm, there is soft 1 out of 6 systolic murmur, rubs or gallops.  1+ bilateral chronic lower extremity edema. 2+ pedal pulses. No carotid bruits.  Abdomen:  Soft, non tender, No hepatosplenomegaly. Bowel sounds positive.  Musculoskeletal: no clubbing / cyanosis. Moves all extremities.  There are several areas of chronic ecchymosis. Skin: no jaundice, no healing sacral decubitus erosion, with significant erythema, full to palpation..  Neurologic: Sensation intact  Strength equal in all extremities Psychiatric:   Alert and oriented x 3. Normal  mood.     Labs on Admission: I have personally reviewed following labs and imaging studies  CBC: Recent Labs  Lab 09/16/2017 1340  WBC 7.8  HGB 15.1  HCT 44.4  MCV 94.1  PLT 107*    Basic Metabolic Panel: Recent Labs  Lab 09/05/2017 1340  NA 133*  K 4.7  CL 90*  CO2 21*  GLUCOSE 351*  BUN 47*  CREATININE 5.00*  CALCIUM 9.2    GFR: CrCl cannot be calculated (Unknown ideal weight.).  Liver Function Tests: No results for input(s): AST, ALT, ALKPHOS, BILITOT, PROT, ALBUMIN in the last 168 hours. No results for input(s): LIPASE, AMYLASE in the last 168 hours. No results for input(s): AMMONIA in the last 168 hours.  Coagulation Profile: No results for input(s): INR, PROTIME in the last 168 hours.  Cardiac Enzymes: No results for input(s): CKTOTAL, CKMB, CKMBINDEX, TROPONINI in the last 168 hours.  BNP (last 3 results) No results for input(s): PROBNP in the last 8760 hours.  HbA1C: No results for input(s): HGBA1C in the last 72 hours.  CBG: Recent Labs  Lab 09/24/2017 1458  GLUCAP 332*    Lipid Profile: No results for input(s): CHOL, HDL, LDLCALC, TRIG, CHOLHDL, LDLDIRECT in the last 72 hours.  Thyroid Function Tests: No results for input(s): TSH, T4TOTAL, FREET4, T3FREE, THYROIDAB in the last 72 hours.  Anemia Panel: No results for input(s): VITAMINB12, FOLATE, FERRITIN, TIBC, IRON, RETICCTPCT in the last 72 hours.  Urine analysis:    Component Value Date/Time   COLORURINE YELLOW 07/12/2017 2104   APPEARANCEUR CLEAR 07/12/2017 2104   LABSPEC 1.011 07/12/2017 2104   PHURINE 5.0 07/12/2017 2104   GLUCOSEU NEGATIVE 07/12/2017 2104   HGBUR NEGATIVE 07/12/2017 2104   HGBUR negative 06/28/2010 0834   BILIRUBINUR NEGATIVE 07/12/2017 2104   BILIRUBINUR n 04/19/2016 1205   KETONESUR NEGATIVE 07/12/2017 2104   PROTEINUR NEGATIVE 07/12/2017 2104   UROBILINOGEN 0.2 04/19/2016 1205   UROBILINOGEN 0.2 11/20/2014 1509   NITRITE NEGATIVE 07/12/2017 2104    LEUKOCYTESUR NEGATIVE 07/12/2017 2104    Sepsis Labs: @LABRCNTIP (procalcitonin:4,lacticidven:4) )No results found for this or any previous visit (from the past 240 hour(s)).   Radiological Exams on Admission: Dg Abdomen 1 View  Result Date: 09/15/2017 CLINICAL DATA:  Weakness, nonproductive cough for the past 2-3 days. History of coronary artery disease. EXAM: ABDOMEN - 1 VIEW COMPARISON:  Supine abdominal radiograph of August 18, 2017 FINDINGS: The bowel gas pattern within is within the limits of normal. The colonic stool burden is normal. There is mild distention of the rectum with stool. There are vascular calcifications present. The lumbar spine exhibits multilevel degenerative change. IMPRESSION: Persistent increased stool burden in the rectum worse worrisome for fecal impaction. No bowel obstruction or ileus. Electronically Signed   By: David  Martinique M.D.   On: 09/29/2017 14:18   Dg Chest Port 1 View  Result Date: 09/12/2017 CLINICAL DATA:  Cough and weakness for 2-3 days. EXAM: PORTABLE CHEST 1 VIEW COMPARISON:  08/17/2017 FINDINGS: Cardiac enlargement. Aortic atherosclerosis identified. There are bilateral pleural effusions identified right greater in left. No interstitial edema or airspace opacities. IMPRESSION: 1. Cardiac enlargement and  bilateral pleural effusions. 2.  Aortic Atherosclerosis (ICD10-I70.0). Electronically Signed   By: Kerby Moors M.D.   On: 09/08/2017 14:19    EKG: Independently reviewed.  Assessment/Plan Principal Problem:   Generalized weakness Active Problems:   Wound of sacral region   Insulin dependent diabetes mellitus (HCC)   Hyperlipemia   Gout   Essential hypertension   Non-ST elevation (NSTEMI) myocardial infarction [facility])   Coronary atherosclerosis   Peripheral vascular disease (HCC)   Asymptomatic stenosis of right carotid artery   Sensorineural hearing loss (SNHL) of both ears   Constipation   End stage renal failure on dialysis (HCC)    Anemia of chronic disease    Cellulitis of the sacral area, with no leukocytosis, fever, or tachycardia or evidence of organ failure.  He has elevated lactic acid currently at 6.  Afebrile     Non-septic appearing.  The patient was placed on vancomycin and Zosyn.  He also was given a small bolus of fluid, at a low rate. qsofa1  Admit to inpatient MedSurg Cellulitis order set Bed rest  Continue broad spectrum antibiotics with vancomycin and Zosyn. Obtain wound consultation Blood cultures  Check ESR, serum lactate IVF at a low rate bolus   Generalized weakness, likely multifactorial, in the setting of missing dialysis, fecal impaction and d sacral wound suspicious for cellulitis (see above)  Obtain PT and OT evaluation after bed rest order lifted    Type II Diabetes Current blood sugar level is 351 Lab Results  Component Value Date   HGBA1C 7.6 (H) 07/12/2017  Hgb A1C Hold home oral diabetic medications.  Lantus , SSI     Hypertension BP  90/59   Pulse 77   Controlled Hold  anti-hypertensive medications   Hyperlipidemia Continue home statins   Fecal impaction, per imaging studies Disimpacted in ER COntinue laxatives    ESRD on HD MWF  , Cr  5 Nephrologist Dr. [place]  Nephrology involved  Renal Diet. Other plans as per Nephrology Check BMET in am Continue PhosLo and Nepro  Gout, no acute flare Continue Allopurinol   Anemia of chronic disease Hemoglobin on admission 10.1 at baseline   Repeat CBC in am  No transfusion is indicated at this time    DVT prophylaxis:  Heparin  Code Status:    Full  Family Communication:  Discussed with patient Disposition Plan: Expect patient to be discharged to home after condition improves Consults called:    Nephrology, Wound Care Admission status:  IP medsurg    Sharene Butters, PA-C [facility]   Amion text  [phone]   09/19/2017, 4:08 PM

## 2017-09-25 NOTE — Progress Notes (Signed)
Pharmacy Antibiotic Note  Douglas Meyer is a 77 y.o. male with ESRD-MWF admitted on 09/26/2017 with weakness and hypotension concerning for sepsis. Pharmacy has been consulted for Vancomycin and Zosyn dosing.  Received Vancomycin 1500 mg IV x 1 at 1600 today and Zosyn 3.375g at 1530.   Plan: 1. No standing Vanc - will f/u HD doses 2. Zosyn 3.375 IV every 12 hours (infused over 4 hours) 3. Will continue to follow HD schedule/duration, culture results, LOT, and antibiotic de-escalation plans      Temp (24hrs), Avg:97.3 F (36.3 C), Min:97.3 F (36.3 C), Max:97.3 F (36.3 C)  Recent Labs  Lab 09/17/2017 1340 09/13/2017 1520 09/26/2017 1539 09/16/2017 1819  WBC 7.8  --   --   --   CREATININE 5.00*  --   --   --   LATICACIDVEN  --  7.58* 6.08* 2.7*    CrCl cannot be calculated (Unknown ideal weight.).    No Known Allergies  Antimicrobials this admission: Vanc 4/22 >> Zosyn 4/22 >>  Dose adjustments this admission: n/a  Microbiology results: 4/22 BCx >>  Thank you for allowing pharmacy to be a part of this patient's care.  Lawson Radar 09/11/2017 7:37 PM

## 2017-09-26 ENCOUNTER — Other Ambulatory Visit: Payer: Self-pay

## 2017-09-26 DIAGNOSIS — E43 Unspecified severe protein-calorie malnutrition: Secondary | ICD-10-CM

## 2017-09-26 DIAGNOSIS — L8990 Pressure ulcer of unspecified site, unspecified stage: Secondary | ICD-10-CM

## 2017-09-26 LAB — GLUCOSE, CAPILLARY
GLUCOSE-CAPILLARY: 142 mg/dL — AB (ref 65–99)
GLUCOSE-CAPILLARY: 252 mg/dL — AB (ref 65–99)
Glucose-Capillary: 153 mg/dL — ABNORMAL HIGH (ref 65–99)
Glucose-Capillary: 100 mg/dL — ABNORMAL HIGH (ref 65–99)
Glucose-Capillary: 45 mg/dL — ABNORMAL LOW (ref 65–99)
Glucose-Capillary: 109 mg/dL — ABNORMAL HIGH (ref 65–99)

## 2017-09-26 LAB — CBC
HCT: 41.6 % (ref 39.0–52.0)
Hemoglobin: 14.2 g/dL (ref 13.0–17.0)
MCH: 31.8 pg (ref 26.0–34.0)
MCHC: 34.1 g/dL (ref 30.0–36.0)
MCV: 93.3 fL (ref 78.0–100.0)
PLATELETS: 99 10*3/uL — AB (ref 150–400)
RBC: 4.46 MIL/uL (ref 4.22–5.81)
RDW: 18.3 % — AB (ref 11.5–15.5)
WBC: 9 10*3/uL (ref 4.0–10.5)

## 2017-09-26 LAB — TROPONIN I
TROPONIN I: 0.08 ng/mL — AB (ref <0.03)
TROPONIN I: 0.08 ng/mL — AB (ref <0.03)

## 2017-09-26 LAB — SEDIMENTATION RATE: SED RATE: 5 mm/h (ref 0–16)

## 2017-09-26 LAB — LACTIC ACID, PLASMA: Lactic Acid, Venous: 5.1 mmol/L (ref 0.5–1.9)

## 2017-09-26 LAB — MRSA PCR SCREENING: MRSA BY PCR: NEGATIVE

## 2017-09-26 MED ORDER — JUVEN PO PACK
1.0000 | PACK | Freq: Two times a day (BID) | ORAL | Status: DC
  Administered 2017-09-26: 1 via ORAL
  Filled 2017-09-26 (×4): qty 1

## 2017-09-26 MED ORDER — VANCOMYCIN HCL IN DEXTROSE 750-5 MG/150ML-% IV SOLN
750.0000 mg | INTRAVENOUS | Status: DC
  Filled 2017-09-26: qty 150

## 2017-09-26 MED ORDER — DEXTROSE 50 % IV SOLN
25.0000 mL | Freq: Once | INTRAVENOUS | Status: AC
  Administered 2017-09-26: 25 mL via INTRAVENOUS
  Filled 2017-09-26: qty 50

## 2017-09-26 MED ORDER — SODIUM CHLORIDE 0.9 % IV BOLUS
250.0000 mL | Freq: Once | INTRAVENOUS | Status: AC
  Administered 2017-09-26: 250 mL via INTRAVENOUS

## 2017-09-26 MED ORDER — MUPIROCIN CALCIUM 2 % EX CREA
1.0000 "application " | TOPICAL_CREAM | Freq: Every day | CUTANEOUS | Status: DC
  Administered 2017-09-26: 1 via TOPICAL
  Filled 2017-09-26: qty 15

## 2017-09-26 NOTE — Progress Notes (Signed)
Initial Nutrition Assessment  DOCUMENTATION CODES:   Severe malnutrition in context of chronic illness  INTERVENTION:  Continue Nepro TID Give Juven BID each supplement provides 80 kcal and 14 grams of protein.   NUTRITION DIAGNOSIS:   Severe Malnutrition related to (ESRD, Chronic wounds) as evidenced by severe fat depletion, severe muscle depletion.  GOAL:   Patient will meet greater than or equal to 90% of their needs  MONITOR:   PO intake, Supplement acceptance, Labs, I & O's, Skin, Weight trends  REASON FOR ASSESSMENT:   Malnutrition Screening Tool    ASSESSMENT:   77 y.o. M admitted on 09/21/2017 for lethargy and hypotension with possible sepsis and sacral ulcer with pressure injury. Pt has fecal incontinence. Pt with ESRD not tolerating HD (began HD 07/2017). PMH of PVD, CAD, HTN, HLD, T2DM, nephrolithiasis, gout, polyp of colon, Hx of MI, and skin cancer. Pt drinking 1 beer daily PTA.   Possible palliative care consultation per MD note. Pt not tolerating dialysis.  Pt was difficult to rouse and wasn't able to give a detailed nutrition history: Pt reports weight loss stating he is "very skinny" now; unable to specify the amount or timeframe. Pt reports taking Nepro daily and a renal vitamin at night PTA. Pt couldn't give detailed PO intake PTA, but did state he was "not eating very well". Pt reports eating some breakfast this morning consisting of eggs and bacon, pt is drinking the Nepro.    Current supplements: Nepro TID  Medications reviewed: heparin, novolog 0-9 units, lantus 26 units, omega-3, zosyn, phoslo.  Labs reviewed: CBG 153 (H), troponin 0.08 (H). 10/02/2017 venous lactic acid 5.1 (H).    Intake/Output Summary (Last 24 hours) at 09/26/2017 1319 Last data filed at 09/26/2017 1142 Gross per 24 hour  Intake 740 ml  Output 1 ml  Net 739 ml   Lab Results  Component Value Date   HGBA1C 8.2 (H) 09/20/2017   NUTRITION - FOCUSED PHYSICAL EXAM:    Most Recent  Value  Orbital Region  Mild depletion  Upper Arm Region  Severe depletion  Thoracic and Lumbar Region  Moderate depletion  Buccal Region  Moderate depletion  Temple Region  Severe depletion  Clavicle Bone Region  Severe depletion  Clavicle and Acromion Bone Region  Severe depletion  Scapular Bone Region  Unable to assess  Dorsal Hand  Severe depletion  Patellar Region  Severe depletion  Anterior Thigh Region  Severe depletion  Posterior Calf Region  Severe depletion  Edema (RD Assessment)  None  Mouth  Reviewed  Skin  Reviewed [Ecchymosis]      Diet Order:  Fall precautions Diet renal/carb modified with fluid restriction Diet-HS Snack? Nothing; Fluid restriction: 1200 mL Fluid; Room service appropriate? Yes; Fluid consistency: Thin  EDUCATION NEEDS:   Education needs have been addressed  Skin:  Skin Assessment: Skin Integrity Issues: Skin Integrity Issues:: Unstageable, Other (Comment), Stage III Stage III: sacral PI Unstageable: left heel PI Other: deep tissuer injury to L/R foot  Last BM:  09/26/17  Height:   Ht Readings from Last 1 Encounters:  09/26/17 5' 5.5" (1.664 m)    Weight:   Wt Readings from Last 1 Encounters:  09/26/17 141 lb 5 oz (64.1 kg)   UBW: 154 lbs %UBW: 92% Unable to determine dry weight.  Ideal Body Weight:  63.18 kg  BMI:  Body mass index is 23.16 kg/m.  Estimated Nutritional Needs:   Kcal:  1700-1900 kcal  Protein:  85-100 grams  Fluid:  1.[place], Dietetic Intern

## 2017-09-26 NOTE — Care Management Note (Addendum)
Case Management Note  Patient Details  Name: Douglas Meyer MRN: [id number] Date of Birth: 1941-01-13  Subjective/Objective:   History of  PVD, CAD, HTN, hyperlipidemia, DM type 2, Gout, ESRD on hemodialysis. Admitted for Sepsis likely secondary to cellulitis around decubitus ulcer stage III.          Action/Plan: Wound consult. Palliative care consult to establish goals of care. PCP noted. Prior to admission patient lived at home with wife.  NCM will continue to monitor for discharge transition needs.  Expected Discharge Date:   To be determined.               Expected Discharge Plan:  Home/Self Care  In-House Referral:  Hospice / Palliative Care  Discharge planning Services  CM Consult  Post Acute Care Choice:    Choice offered to:     DME Arranged:    DME Agency:     HH Arranged:    HH Agency:     Status of Service:  In process, will continue to follow  Kristen Cardinal, RN 09/26/2017, 1:30 PM

## 2017-09-26 NOTE — Progress Notes (Signed)
PROGRESS NOTE    Douglas Meyer  BTD:[id number] DOB: 29-Jan-1941 DOA: 10/02/2017 PCP: Dorena Cookey, MD   Brief Narrative:   77 year old male with a history of peripheral vascular disease, CAD, essential hypertension, hyperlipidemia, diabetes mellitus type 2, ESRD on hemodialysis since February 2019 was brought to the hospital for evaluation of progressive weakness and lethargy.  Patient was noted to be very weak yesterday therefore did not go to his hemodialysis and was brought to the ED. in the ER patient was noted to have slightly elevated lactic acid and soft blood pressure.  Concerns had infected sacral decubitus ulcer therefore started on vancomycin and Zosyn.  Assessment & Plan:   Principal Problem:   Generalized weakness Active Problems:   Insulin dependent diabetes mellitus (HCC)   Hyperlipemia   Gout   Essential hypertension   Non-ST elevation (NSTEMI) myocardial infarction [facility])   Coronary atherosclerosis   Peripheral vascular disease (HCC)   Asymptomatic stenosis of right carotid artery   Sensorineural hearing loss (SNHL) of both ears   Constipation   End stage renal failure on dialysis (HCC)   Anemia of chronic disease   Wound of sacral region   Pressure injury of skin   Sepsis likely secondary to cellulitis around decubitus ulcer stage III - Currently wound cultures have been done GI bleed GI exam blood cultures -Continue vancomycin and Zosyn -Wound care consult has been placed -Daily routine care, gentle IV fluids if necessary caution with the ESRD -Monitor close vital sign, pain control as deemed necessary  ESRD on hemodialysis Monday Wednesday Friday -Patient will need inpatient dialysis therefore nephrology has been consulted  Coronary artery disease, stable Peripheral vascular disease, stable -Continue aspirin 81 mg daily, Plavix 75 daily, statin - Pain control, provide supportive care  Diabetes mellitus type 2 -Continue Lantus 26 units daily, insulin  sliding scale  Advance health care planning - Due to patient's chronic comorbid conditions he would benefit from palliative care consult to establish goals of care.  I have had an extensive discussion with the patient's daughter who is at bedside and would like to speak with palliative care service therefore consult has been placed.  Generalized weakness and deconditioning -We will consult PT/OT  History of gout -Continue allopurinol 300 mg daily   DVT prophylaxis: Subcutaneous heparin  Code Status: Full Code  Family Communication: Daughter at bedside   Disposition Plan: Maintain Inpatient stay  Consultants:   Wound care  Nephrology  Procedures:   None  Antimicrobials:   Vanc 4/22>>  Zosyn 4/22 >>   Subjective: No complaints at the moment, Had an extensive discussion regarding benefit of palliative care with the family.   Review of Systems Otherwise negative except as per HPI, including: HEENT/EYES = negative for pain, redness, loss of vision, double vision, blurred vision, loss of hearing, sore throat, hoarseness, dysphagia Cardiovascular= negative for chest pain, palpitation, murmurs, lower extremity swelling Respiratory/lungs= negative for shortness of breath, cough, hemoptysis, wheezing, mucus production Gastrointestinal= negative for nausea, vomiting,, abdominal pain, melena, hematemesis Genitourinary= negative for Dysuria, Hematuria, Change in Urinary Frequency MSK = Negative for arthralgia, myalgias, Back Pain, Joint swelling  Neurology= Negative for headache, seizures, numbness, tingling  Psychiatry= Negative for anxiety, depression, suicidal and homocidal ideation Allergy/Immunology= Medication/Food allergy as listed  Skin= Negative for Rash, lesions, ulcers, itching   Objective: Vitals:   09/26/17 0026 09/26/17 0409 09/26/17 0417 09/26/17 0858  BP: (!) 94/56 (!) 83/52 (!) 92/50 (!) 84/58  Pulse: 75 77  76  Resp:  18  Temp: 97.6 F (36.4 C) 97.6 F  (36.4 C)  97.6 F (36.4 C)  TempSrc:  Oral    SpO2: 94% 91%    Weight: 64.1 kg (141 lb 5 oz)     Height: 5' 5.5" (1.664 m)       Intake/Output Summary (Last 24 hours) at 09/26/2017 1233 Last data filed at 09/26/2017 1142 Gross per 24 hour  Intake 740 ml  Output 1 ml  Net 739 ml   Filed Weights   09/26/17 0026  Weight: 64.1 kg (141 lb 5 oz)    Examination:  General exam: Appears calm and comfortable; elderly frail appearing. 2L [place] Respiratory system: diminished BS at the bases.  Cardiovascular system: S1 & S2 heard, RRR. No JVD, murmurs, rubs, gallops or clicks. Trace b/l LE pitting pedal edema. Gastrointestinal system: Abdomen is nondistended, soft and nontender. No organomegaly or masses felt. Normal bowel sounds heard. Central nervous system: Alert and oriented. No focal neurological deficits. Extremities: Symmetric 5 x 5 power. Skin: No rashes, lesions or ulcers Psychiatry: Judgement and insight appear normal. Mood & affect appropriate.  Stage III sacral ulcer with surrounding erythema.    Data Reviewed:   CBC: Recent Labs  Lab 09/13/2017 1340 09/26/17 0926  WBC 7.8 9.0  HGB 15.1 14.2  HCT 44.4 41.6  MCV 94.1 93.3  PLT 107* 99*   Basic Metabolic Panel: Recent Labs  Lab 09/18/2017 1340  NA 133*  K 4.7  CL 90*  CO2 21*  GLUCOSE 351*  BUN 47*  CREATININE 5.00*  CALCIUM 9.2   GFR: Estimated Creatinine Clearance: 11 mL/min (A) (by C-G formula based on SCr of 5 mg/dL (H)). Liver Function Tests: No results for input(s): AST, ALT, ALKPHOS, BILITOT, PROT, ALBUMIN in the last 168 hours. No results for input(s): LIPASE, AMYLASE in the last 168 hours. No results for input(s): AMMONIA in the last 168 hours. Coagulation Profile: No results for input(s): INR, PROTIME in the last 168 hours. Cardiac Enzymes: Recent Labs  Lab 09/15/2017 1819 09/26/17 0053 09/26/17 0704  TROPONINI 0.03* 0.08* 0.08*   BNP (last 3 results) No results for input(s): PROBNP in the last  8760 hours. HbA1C: Recent Labs    09/30/2017 1819  HGBA1C 8.2*   CBG: Recent Labs  Lab 09/19/2017 1458 09/12/2017 2335 09/26/17 0020 09/26/17 0715 09/26/17 1144  GLUCAP 332* 272* 252* 153* 142*   Lipid Profile: No results for input(s): CHOL, HDL, LDLCALC, TRIG, CHOLHDL, LDLDIRECT in the last 72 hours. Thyroid Function Tests: No results for input(s): TSH, T4TOTAL, FREET4, T3FREE, THYROIDAB in the last 72 hours. Anemia Panel: No results for input(s): VITAMINB12, FOLATE, FERRITIN, TIBC, IRON, RETICCTPCT in the last 72 hours. Sepsis Labs: Recent Labs  Lab 09/16/2017 1520 09/13/2017 1539 09/10/2017 1819 09/13/2017 2336  LATICACIDVEN 7.58* 6.08* 2.7* 5.1*    Recent Results (from the past 240 hour(s))  Culture, blood (routine x 2)     Status: None (Preliminary result)   Collection Time: 09/22/2017  3:30 PM  Result Value Ref Range Status   Specimen Description BLOOD RIGHT FOREARM  Final   Special Requests   Final    BOTTLES DRAWN AEROBIC ONLY Blood Culture results may not be optimal due to an inadequate volume of blood received in culture bottles   Culture   Final    NO GROWTH < 24 HOURS Performed at [facility], [place] [address] [place], [place] [address]    Report Status PENDING  Incomplete  Culture, blood (  routine x 2)     Status: None (Preliminary result)   Collection Time: 09/13/2017  3:51 PM  Result Value Ref Range Status   Specimen Description BLOOD RIGHT ANTECUBITAL  Final   Special Requests   Final    BOTTLES DRAWN AEROBIC ONLY Blood Culture results may not be optimal due to an inadequate volume of blood received in culture bottles   Culture   Final    NO GROWTH < 24 HOURS Performed at [facility] [address], [place], [place] [address]    Report Status PENDING  Incomplete  MRSA PCR Screening     Status: None   Collection Time: 09/26/17 12:41 AM  Result Value Ref Range Status   MRSA by PCR NEGATIVE NEGATIVE Final    Comment:        The GeneXpert MRSA Assay  (FDA approved for NASAL specimens only), is one component of a comprehensive MRSA colonization surveillance program. It is not intended to diagnose MRSA infection nor to guide or monitor treatment for MRSA infections. Performed at [facility], [place] [address], [place], [place] [address]          Radiology Studies: Dg Abdomen 1 View  Result Date: 10/03/2017 CLINICAL DATA:  Weakness, nonproductive cough for the past 2-3 days. History of coronary artery disease. EXAM: ABDOMEN - 1 VIEW COMPARISON:  Supine abdominal radiograph of August 18, 2017 FINDINGS: The bowel gas pattern within is within the limits of normal. The colonic stool burden is normal. There is mild distention of the rectum with stool. There are vascular calcifications present. The lumbar spine exhibits multilevel degenerative change. IMPRESSION: Persistent increased stool burden in the rectum worse worrisome for fecal impaction. No bowel obstruction or ileus. Electronically Signed   By: David  Martinique M.D.   On: 09/21/2017 14:18   Dg Chest Port 1 View  Result Date: 09/29/2017 CLINICAL DATA:  Cough and weakness for 2-3 days. EXAM: PORTABLE CHEST 1 VIEW COMPARISON:  08/17/2017 FINDINGS: Cardiac enlargement. Aortic atherosclerosis identified. There are bilateral pleural effusions identified right greater in left. No interstitial edema or airspace opacities. IMPRESSION: 1. Cardiac enlargement and bilateral pleural effusions. 2.  Aortic Atherosclerosis (ICD10-I70.0). Electronically Signed   By: Kerby Moors M.D.   On: 09/16/2017 14:19        Scheduled Meds: . allopurinol  300 mg Oral Daily  . aspirin  81 mg Oral Daily  . atorvastatin  20 mg Oral q1800  . calcium acetate  667 mg Oral TID WC  . clopidogrel  75 mg Oral Daily  . feeding supplement (NEPRO CARB STEADY)  237 mL Oral TID BM  . heparin  5,000 Units Subcutaneous Q8H  . hydrocortisone  25 mg Rectal BID  . insulin aspart  0-9 Units Subcutaneous TID WC  .  insulin glargine  26 Units Subcutaneous Daily  . mupirocin cream  1 application Topical Daily  . omega-3 acid ethyl esters  1,000 mg Oral QHS   Continuous Infusions: . piperacillin-tazobactam (ZOSYN)  IV Stopped (09/26/17 1056)     LOS: 1 day    Time spent: 30 mins    Ankit Arsenio Loader, MD [facility] Pager [phone]   If 7PM-7AM, please contact night-coverage [email or web address] Password [facility] 09/26/2017, 12:33 PM

## 2017-09-26 NOTE — Progress Notes (Signed)
Patient lactic acid level increased to 5.1.Text page Forrest Moron NP.New orders received and carried out.

## 2017-09-26 NOTE — Progress Notes (Signed)
PT Cancellation Note  Patient Details Name: Douglas Meyer MRN: [id number] DOB: 1940-07-31   Cancelled Treatment:    Reason Eval/Treat Not Completed: Other (comment) RN requests hold today as patient just had palliative consult and may not ultimately be appropriate for PT. Hold PT for today per RN, plan to attempt to check back on next day of service regarding patient status/appropriateness for PT.    Deniece Ree PT, DPT, [place]   Pager [phone]

## 2017-09-26 NOTE — Consult Note (Addendum)
[place] Nurse wound consult note Reason for Consult: Consult requested for several wounds. Wound type: Sacrum with chronic stage 3 pressure injury; .3X.3X.2cm, 50% dry red, 50% yellow wound bed, no odor or drainage Left plantar foot with dark purple black callous; intact skin, no open wound, drainage, or fluctuance, .3X.5cm Left heel with deep tissue injury; .3X.4cm, dark purple-black with intact skin, no open wound, drainage, or fluctuance. Right plantar foot with dark purple black callous; intact skin, no open wound, drainage, or fluctuance, .3X.4cm Left and right heels with stage 1 nonblanchable skin; approx 3X3cm, feet are purple and mottled and cool to touch. Pt is frequently incontinent of stool and it is difficult to keep wound from becoming soiled related to the close proximity to the rectum. Pressure Injury POA: Yes Dressing procedure/placement/frequency: Bactroban to promote moist healing to sacrum and bilat plantar feet.  Float heels to reduce pressure to bilat heels.  Discussed plan of care with patient's family member at the bedside. Please re-consult if further assistance is needed.  Thank-you,  Julien Girt MSN, [place], [place], [place], [place]

## 2017-09-27 ENCOUNTER — Encounter (HOSPITAL_COMMUNITY): Payer: Self-pay | Admitting: *Deleted

## 2017-09-27 ENCOUNTER — Inpatient Hospital Stay (HOSPITAL_COMMUNITY): Payer: Medicare Other

## 2017-09-27 ENCOUNTER — Encounter (HOSPITAL_COMMUNITY): Admission: EM | Disposition: E | Payer: Self-pay | Source: Home / Self Care | Attending: Internal Medicine

## 2017-09-27 DIAGNOSIS — N186 End stage renal disease: Secondary | ICD-10-CM

## 2017-09-27 DIAGNOSIS — D62 Acute posthemorrhagic anemia: Secondary | ICD-10-CM

## 2017-09-27 DIAGNOSIS — K626 Ulcer of anus and rectum: Secondary | ICD-10-CM

## 2017-09-27 DIAGNOSIS — K625 Hemorrhage of anus and rectum: Secondary | ICD-10-CM

## 2017-09-27 DIAGNOSIS — K922 Gastrointestinal hemorrhage, unspecified: Secondary | ICD-10-CM

## 2017-09-27 DIAGNOSIS — E162 Hypoglycemia, unspecified: Secondary | ICD-10-CM

## 2017-09-27 DIAGNOSIS — K649 Unspecified hemorrhoids: Secondary | ICD-10-CM

## 2017-09-27 DIAGNOSIS — Z86010 Personal history of colonic polyps: Secondary | ICD-10-CM

## 2017-09-27 DIAGNOSIS — K921 Melena: Secondary | ICD-10-CM

## 2017-09-27 DIAGNOSIS — Z515 Encounter for palliative care: Secondary | ICD-10-CM

## 2017-09-27 DIAGNOSIS — Z992 Dependence on renal dialysis: Secondary | ICD-10-CM

## 2017-09-27 DIAGNOSIS — R531 Weakness: Secondary | ICD-10-CM

## 2017-09-27 HISTORY — PX: FLEXIBLE SIGMOIDOSCOPY: SHX5431

## 2017-09-27 LAB — COMPREHENSIVE METABOLIC PANEL
ALT: 332 U/L — AB (ref 17–63)
ANION GAP: 14 (ref 5–15)
AST: 266 U/L — ABNORMAL HIGH (ref 15–41)
Albumin: 1.6 g/dL — ABNORMAL LOW (ref 3.5–5.0)
Alkaline Phosphatase: 268 U/L — ABNORMAL HIGH (ref 38–126)
BUN: 71 mg/dL — ABNORMAL HIGH (ref 6–20)
CO2: 23 mmol/L (ref 22–32)
CREATININE: 5.79 mg/dL — AB (ref 0.61–1.24)
Calcium: 7.4 mg/dL — ABNORMAL LOW (ref 8.9–10.3)
Chloride: 93 mmol/L — ABNORMAL LOW (ref 101–111)
GFR, EST AFRICAN AMERICAN: 10 mL/min — AB (ref >60)
GFR, EST NON AFRICAN AMERICAN: 8 mL/min — AB (ref >60)
Glucose, Bld: 229 mg/dL — ABNORMAL HIGH (ref 65–99)
Potassium: 4.2 mmol/L (ref 3.5–5.1)
SODIUM: 130 mmol/L — AB (ref 135–145)
Total Bilirubin: 2 mg/dL — ABNORMAL HIGH (ref 0.3–1.2)
Total Protein: 3.5 g/dL — ABNORMAL LOW (ref 6.5–8.1)

## 2017-09-27 LAB — CBC
HCT: 40.8 % (ref 39.0–52.0)
HEMOGLOBIN: 13.8 g/dL (ref 13.0–17.0)
MCH: 31.1 pg (ref 26.0–34.0)
MCHC: 33.8 g/dL (ref 30.0–36.0)
MCV: 91.9 fL (ref 78.0–100.0)
Platelets: 87 10*3/uL — ABNORMAL LOW (ref 150–400)
RBC: 4.44 MIL/uL (ref 4.22–5.81)
RDW: 18.4 % — AB (ref 11.5–15.5)
WBC: 8.9 10*3/uL (ref 4.0–10.5)

## 2017-09-27 LAB — GLUCOSE, CAPILLARY
GLUCOSE-CAPILLARY: 162 mg/dL — AB (ref 65–99)
GLUCOSE-CAPILLARY: 134 mg/dL — AB (ref 65–99)
Glucose-Capillary: 41 mg/dL — CL (ref 65–99)
Glucose-Capillary: 61 mg/dL — ABNORMAL LOW (ref 65–99)
Glucose-Capillary: 71 mg/dL (ref 65–99)
Glucose-Capillary: 22 mg/dL — CL (ref 65–99)
Glucose-Capillary: 46 mg/dL — ABNORMAL LOW (ref 65–99)
Glucose-Capillary: 143 mg/dL — ABNORMAL HIGH (ref 65–99)
Glucose-Capillary: 152 mg/dL — ABNORMAL HIGH (ref 65–99)

## 2017-09-27 LAB — HEMOGLOBIN AND HEMATOCRIT, BLOOD
HCT: 36.1 % — ABNORMAL LOW (ref 39.0–52.0)
HEMATOCRIT: 32 % — AB (ref 39.0–52.0)
HEMOGLOBIN: 10.9 g/dL — AB (ref 13.0–17.0)
Hemoglobin: 12.5 g/dL — ABNORMAL LOW (ref 13.0–17.0)

## 2017-09-27 LAB — CBC WITH DIFFERENTIAL/PLATELET
BASOS PCT: 0 %
Basophils Absolute: 0 10*3/uL (ref 0.0–0.1)
EOS ABS: 0 10*3/uL (ref 0.0–0.7)
Eosinophils Relative: 0 %
HEMATOCRIT: 26.5 % — AB (ref 39.0–52.0)
HEMOGLOBIN: 9.1 g/dL — AB (ref 13.0–17.0)
Lymphocytes Relative: 11 %
Lymphs Abs: 0.8 10*3/uL (ref 0.7–4.0)
MCH: 31.4 pg (ref 26.0–34.0)
MCHC: 34.3 g/dL (ref 30.0–36.0)
MCV: 91.4 fL (ref 78.0–100.0)
MONOS PCT: 3 %
Monocytes Absolute: 0.2 10*3/uL (ref 0.1–1.0)
NEUTROS ABS: 6.1 10*3/uL (ref 1.7–7.7)
NEUTROS PCT: 86 %
Platelets: 73 10*3/uL — ABNORMAL LOW (ref 150–400)
RBC: 2.9 MIL/uL — AB (ref 4.22–5.81)
RDW: 18.1 % — ABNORMAL HIGH (ref 11.5–15.5)
WBC: 7.2 10*3/uL (ref 4.0–10.5)

## 2017-09-27 LAB — BASIC METABOLIC PANEL
ANION GAP: 16 — AB (ref 5–15)
BUN: 69 mg/dL — AB (ref 6–20)
CALCIUM: 8.6 mg/dL — AB (ref 8.9–10.3)
CO2: 27 mmol/L (ref 22–32)
Chloride: 90 mmol/L — ABNORMAL LOW (ref 101–111)
Creatinine, Ser: 5.74 mg/dL — ABNORMAL HIGH (ref 0.61–1.24)
GFR calc Af Amer: 10 mL/min — ABNORMAL LOW (ref >60)
GFR, EST NON AFRICAN AMERICAN: 9 mL/min — AB (ref >60)
GLUCOSE: 35 mg/dL — AB (ref 65–99)
POTASSIUM: 3.8 mmol/L (ref 3.5–5.1)
Sodium: 133 mmol/L — ABNORMAL LOW (ref 135–145)

## 2017-09-27 LAB — APTT: APTT: 41 s — AB (ref 24–36)

## 2017-09-27 LAB — PROTIME-INR
INR: 1.46
Prothrombin Time: 17.6 seconds — ABNORMAL HIGH (ref 11.4–15.2)

## 2017-09-27 LAB — PREPARE RBC (CROSSMATCH)

## 2017-09-27 LAB — CORTISOL: Cortisol, Plasma: 21.9 ug/dL

## 2017-09-27 LAB — MAGNESIUM: MAGNESIUM: 2.5 mg/dL — AB (ref 1.7–2.4)

## 2017-09-27 SURGERY — SIGMOIDOSCOPY, FLEXIBLE
Anesthesia: Monitor Anesthesia Care

## 2017-09-27 MED ORDER — ORAL CARE MOUTH RINSE
15.0000 mL | Freq: Two times a day (BID) | OROMUCOSAL | Status: DC
  Administered 2017-09-27: 15 mL via OROMUCOSAL

## 2017-09-27 MED ORDER — SODIUM CHLORIDE 0.9 % IV SOLN: Freq: Once | INTRAVENOUS | Status: DC

## 2017-09-27 MED ORDER — PENTAFLUOROPROP-TETRAFLUOROETH EX AERO: 1.0000 "application " | INHALATION_SPRAY | CUTANEOUS | Status: DC | PRN

## 2017-09-27 MED ORDER — SODIUM CHLORIDE 0.9 % IV BOLUS: 500.0000 mL | Freq: Once | INTRAVENOUS | Status: DC

## 2017-09-27 MED ORDER — LACTATED RINGERS IV BOLUS
1000.0000 mL | Freq: Once | INTRAVENOUS | Status: AC
  Administered 2017-09-27: 1000 mL via INTRAVENOUS

## 2017-09-27 MED ORDER — SODIUM CHLORIDE 0.9 % IV BOLUS: 1000.0000 mL | Freq: Once | INTRAVENOUS | Status: DC

## 2017-09-27 MED ORDER — HEPARIN SODIUM (PORCINE) 1000 UNIT/ML DIALYSIS: 1000.0000 [IU] | INTRAMUSCULAR | Status: DC | PRN

## 2017-09-27 MED ORDER — IOPAMIDOL (ISOVUE-370) INJECTION 76%
100.0000 mL | Freq: Once | INTRAVENOUS | Status: AC | PRN
  Administered 2017-09-27: 100 mL via INTRAVENOUS

## 2017-09-27 MED ORDER — EPINEPHRINE PF 1 MG/10ML IJ SOSY
PREFILLED_SYRINGE | INTRAMUSCULAR | Status: AC
  Filled 2017-09-27: qty 10

## 2017-09-27 MED ORDER — ALTEPLASE 2 MG IJ SOLR
2.0000 mg | Freq: Once | INTRAMUSCULAR | Status: DC | PRN
  Filled 2017-09-27: qty 2

## 2017-09-27 MED ORDER — SODIUM CHLORIDE 0.9 % IV SOLN: 100.0000 mL | INTRAVENOUS | Status: DC | PRN

## 2017-09-27 MED ORDER — SODIUM CHLORIDE 0.9 % IV SOLN
Freq: Once | INTRAVENOUS | Status: AC
  Administered 2017-09-27: 15:00:00 via INTRAVENOUS

## 2017-09-27 MED ORDER — PANTOPRAZOLE SODIUM 40 MG PO TBEC: 40.0000 mg | DELAYED_RELEASE_TABLET | Freq: Every day | ORAL | Status: DC

## 2017-09-27 MED ORDER — PANTOPRAZOLE SODIUM 40 MG IV SOLR
40.0000 mg | Freq: Once | INTRAVENOUS | Status: AC
  Administered 2017-09-27: 40 mg via INTRAVENOUS
  Filled 2017-09-27: qty 40

## 2017-09-27 MED ORDER — DIPHENHYDRAMINE HCL 50 MG/ML IJ SOLN
INTRAMUSCULAR | Status: AC
  Filled 2017-09-27: qty 1

## 2017-09-27 MED ORDER — DEXTROSE 10 % IV SOLN
INTRAVENOUS | Status: DC
  Filled 2017-09-27 (×2): qty 1000

## 2017-09-27 MED ORDER — FENTANYL CITRATE (PF) 100 MCG/2ML IJ SOLN
INTRAMUSCULAR | Status: AC
  Filled 2017-09-27: qty 4

## 2017-09-27 MED ORDER — HYDROCORTISONE NA SUCCINATE PF 100 MG IJ SOLR
50.0000 mg | Freq: Four times a day (QID) | INTRAMUSCULAR | Status: DC
  Administered 2017-09-27 – 2017-09-28 (×3): 50 mg via INTRAVENOUS
  Filled 2017-09-27: qty 2
  Filled 2017-09-27 (×4): qty 1

## 2017-09-27 MED ORDER — PANTOPRAZOLE SODIUM 40 MG IV SOLR
40.0000 mg | Freq: Two times a day (BID) | INTRAVENOUS | Status: DC
  Filled 2017-09-27: qty 40

## 2017-09-27 MED ORDER — CHLORHEXIDINE GLUCONATE 0.12 % MT SOLN
15.0000 mL | Freq: Two times a day (BID) | OROMUCOSAL | Status: DC
  Administered 2017-09-27: 15 mL via OROMUCOSAL

## 2017-09-27 MED ORDER — LIDOCAINE HCL (PF) 1 % IJ SOLN: 5.0000 mL | INTRAMUSCULAR | Status: DC | PRN

## 2017-09-27 MED ORDER — IOPAMIDOL (ISOVUE-370) INJECTION 76%
INTRAVENOUS | Status: AC
  Filled 2017-09-27: qty 100

## 2017-09-27 MED ORDER — MIDAZOLAM HCL 10 MG/2ML IJ SOLN
INTRAMUSCULAR | Status: DC | PRN
  Administered 2017-09-27 (×3): 1 mg via INTRAVENOUS

## 2017-09-27 MED ORDER — SODIUM CHLORIDE 0.9 % IV BOLUS
500.0000 mL | Freq: Once | INTRAVENOUS | Status: DC
Start: 2017-09-27 — End: 2017-09-28

## 2017-09-27 MED ORDER — FENTANYL CITRATE (PF) 100 MCG/2ML IJ SOLN
INTRAMUSCULAR | Status: DC | PRN
  Administered 2017-09-27: 25 ug via INTRAVENOUS

## 2017-09-27 MED ORDER — FLEET ENEMA 7-19 GM/118ML RE ENEM
2.0000 | ENEMA | RECTAL | Status: DC
  Filled 2017-09-27: qty 2

## 2017-09-27 MED ORDER — SODIUM CHLORIDE 0.9 % IV SOLN
INTRAVENOUS | Status: DC
  Administered 2017-09-27: 18:00:00 via INTRAVENOUS

## 2017-09-27 MED ORDER — METHYLPHENIDATE HCL 5 MG PO TABS: 5.0000 mg | ORAL_TABLET | Freq: Once | ORAL | Status: DC

## 2017-09-27 MED ORDER — DEXTROSE 50 % IV SOLN
INTRAVENOUS | Status: AC
  Filled 2017-09-27: qty 50

## 2017-09-27 MED ORDER — LIDOCAINE-PRILOCAINE 2.5-2.5 % EX CREA
1.0000 "application " | TOPICAL_CREAM | CUTANEOUS | Status: DC | PRN
  Filled 2017-09-27: qty 5

## 2017-09-27 MED ORDER — DEXTROSE 50 % IV SOLN
INTRAVENOUS | Status: AC
  Administered 2017-09-27: 50 mL via INTRAVENOUS
  Filled 2017-09-27: qty 50

## 2017-09-27 MED ORDER — DEXTROSE 50 % IV SOLN
1.0000 | Freq: Once | INTRAVENOUS | Status: AC
  Administered 2017-09-27: 50 mL via INTRAVENOUS

## 2017-09-27 MED ORDER — DEXTROSE 50 % IV SOLN
INTRAVENOUS | Status: AC
  Administered 2017-09-27: 25 mL
  Filled 2017-09-27: qty 50

## 2017-09-27 MED ORDER — SODIUM CHLORIDE 0.9 % IV SOLN
0.0000 ug/min | INTRAVENOUS | Status: DC
  Administered 2017-09-27: 20 ug/min via INTRAVENOUS
  Administered 2017-09-28: 190 ug/min via INTRAVENOUS
  Administered 2017-09-28: 200 ug/min via INTRAVENOUS
  Administered 2017-09-28 (×2): 190 ug/min via INTRAVENOUS
  Administered 2017-09-28: 200 ug/min via INTRAVENOUS
  Filled 2017-09-27: qty 1
  Filled 2017-09-27 (×2): qty 10
  Filled 2017-09-27: qty 1
  Filled 2017-09-27: qty 10

## 2017-09-27 MED ORDER — SODIUM CHLORIDE 0.9 % IJ SOLN
PREFILLED_SYRINGE | INTRAMUSCULAR | Status: DC | PRN
  Administered 2017-09-27: 1 mL
  Administered 2017-09-27: 2 mL

## 2017-09-27 MED ORDER — MIDAZOLAM HCL 5 MG/ML IJ SOLN
INTRAMUSCULAR | Status: AC
  Filled 2017-09-27: qty 3

## 2017-09-27 MED ORDER — SODIUM CHLORIDE 0.9 % IV BOLUS
500.0000 mL | Freq: Once | INTRAVENOUS | Status: AC
  Administered 2017-09-27: 500 mL via INTRAVENOUS

## 2017-09-27 MED ORDER — DEXTROSE 50 % IV SOLN
25.0000 g | Freq: Once | INTRAVENOUS | Status: AC
  Administered 2017-09-27: 25 g via INTRAVENOUS

## 2017-09-27 NOTE — Progress Notes (Addendum)
PROGRESS NOTE    Douglas Meyer  ZHG:[id number] DOB: 04-29-1941 DOA: 09/05/2017 PCP: Dorena Cookey, MD   Brief Narrative:   77 year old male with a history of peripheral vascular disease, CAD, essential hypertension, hyperlipidemia, diabetes mellitus type 2, ESRD on hemodialysis since February 2019 was brought to the hospital for evaluation of progressive weakness and lethargy.  Patient was noted to be very weak yesterday therefore did not go to his hemodialysis and was brought to the ED. in the ER patient was noted to have slightly elevated lactic acid and soft blood pressure.  Concerns had infected sacral decubitus ulcer therefore started on vancomycin and Zosyn. His stay was complicated by LGIB therefore GI was consulted.   Assessment & Plan:   Principal Problem:   Generalized weakness Active Problems:   Insulin dependent diabetes mellitus (HCC)   Hyperlipemia   Gout   Essential hypertension   Non-ST elevation (NSTEMI) myocardial infarction [facility])   Coronary atherosclerosis   Peripheral vascular disease (HCC)   Asymptomatic stenosis of right carotid artery   Sensorineural hearing loss (SNHL) of both ears   Constipation   End stage renal failure on dialysis (HCC)   Anemia of chronic disease   Wound of sacral region   Pressure injury of skin   Protein-calorie malnutrition, severe  Lower GI Bleed Bright Red Blood Rectum -Patient has had at least greater than 600 cc of blood per rectum which was bright red mixed with clots -Started patient on IV PPI, due to storage pharmacy may use Pepcid.  I have discussed this with the pharmacist - Spoke with gastroenterology APP, who will see the patient today.  I would advise to hold off on GI bleeding scan at this time -500 cc of normal saline bolus given with caution especially patient is ESRD on dialysis. -Closely monitor hemoglobin and transfuse if hemoglobin falls below 8. -Holding aspirin and Plavix.  Subcutaneous heparin discontinued  and SCD ordered  Sepsis likely secondary to cellulitis around decubitus ulcer stage III -Cultures remain negative.  Patient is on vancomycin and Zosyn -Wound care consult has been placed -Daily routine care, gentle IV fluids if necessary caution with the ESRD -Monitor close vital sign, pain control as deemed necessary  ESRD on hemodialysis Monday Wednesday Friday -Patient will need inpatient dialysis therefore nephrology has been consulted  Thrombocytopenia -Closely monitor platelets.  Will transfuse if it drops below 50 and if he is actively bleeding.  Coronary artery disease, stable Peripheral vascular disease, stable -Continue statin. -Aspirin and Plavix on hold due to bleeding - Pain control, provide supportive care  Diabetes mellitus type 2 Hypoglycemic episode due to poor p.o. intake -Discontinue Lantus 26 units daily for now -Continue sliding scale  Advance health care planning -Palliative care team to meet with the family today.  Generalized weakness and deconditioning -PT/OT once medically stable  Moderate to severe protein calorie malnutrition -Nutrition team consulted.   History of gout -Continue allopurinol 300 mg daily   DVT prophylaxis: SCDs due to bleed.  Code Status: Full Code  Family Communication: Daughter at bedside   Disposition Plan: Maintain Inpatient stay. Transfer to stepdown  Consultants:   Wound care  Nephrology  Gastroenterology  Procedures:   None  Antimicrobials:   Vanc 4/22>>  Zosyn 4/22 >>   Subjective: This morning around 5 AM patient had a large bloody bowel movement and had few since.  Per nursing staff patient has lost at least 6-700 cc of blood which is bright red plus blood clots.  Hemodynamically remains stable at this time. Patient states he has never had this before in the past.  His last colonoscopy was greater than 10 years ago which according to him was normal.  Family is at bedside.  He denies any abdominal  pain, nausea, vomiting and other complaints.  Review of Systems Otherwise negative except as per HPI, including: HEENT/EYES = negative for pain, redness, loss of vision, double vision, blurred vision, loss of hearing, sore throat, hoarseness, dysphagia Cardiovascular= negative for chest pain, palpitation, murmurs, lower extremity swelling Respiratory/lungs= negative for shortness of breath, cough, hemoptysis, wheezing, mucus production Gastrointestinal= negative for nausea, vomiting,, abdominal pain Genitourinary= negative for Dysuria, Hematuria, Change in Urinary Frequency MSK = Negative for arthralgia, myalgias, Back Pain, Joint swelling  Neurology= Negative for headache, seizures, numbness, tingling  Psychiatry= Negative for anxiety, depression, suicidal and homocidal ideation Allergy/Immunology= Medication/Food allergy as listed  Skin= Negative for Rash, lesions, ulcers, itching    Objective: Vitals:   09/14/2017 0536 09/26/2017 0555 10/03/2017 0623 09/23/2017 0656  BP: (!) 93/57 104/60 (!) 91/54 (!) 89/54  Pulse: 82 86 60 79  Resp: 18 18 18 18   Temp:      TempSrc:      SpO2: 100% 100% 100% 97%  Weight:      Height:        Intake/Output Summary (Last 24 hours) at 09/13/2017 0852 Last data filed at 09/09/2017 0600 Gross per 24 hour  Intake 640 ml  Output 4 ml  Net 636 ml   Filed Weights   09/26/17 0026  Weight: 64.1 kg (141 lb 5 oz)    Examination:  Constitutional: NAD, calm, comfortable, elderly frail-appearing with temporal wasting bilaterally Eyes: PERRL, lids and conjunctivae pale; drowsy ENMT: Mucous membranes are moist. Posterior pharynx clear of any exudate or lesions.Normal dentition.  Neck: normal, supple, no masses, no thyromegaly Respiratory: Diminished breath sounds at the bases Cardiovascular: Regular rate and rhythm, no murmurs / rubs / gallops. No extremity edema. 2+ pedal pulses. No carotid bruits.  Abdomen: no tenderness, no masses palpated. No  hepatosplenomegaly. Bowel sounds positive.  Bright red blood around his rectum area noted Musculoskeletal: no clubbing / cyanosis. No joint deformity upper and lower extremities. Good ROM, no contractures. Normal muscle tone.  Skin: no rashes, lesions, ulcers. No induration Neurologic: CN 2-12 grossly intact. Sensation intact, DTR normal. Strength 5/5 in all 4.  Psychiatric: Normal judgment and insight. Alert and oriented x 3. Normal mood.     Data Reviewed:   CBC: Recent Labs  Lab 09/18/2017 1340 09/26/17 0926 09/14/2017 0517 09/15/2017 0636  WBC 7.8 9.0 8.9  --   HGB 15.1 14.2 13.8 12.5*  HCT 44.4 41.6 40.8 36.1*  MCV 94.1 93.3 91.9  --   PLT 107* 99* 87*  --    Basic Metabolic Panel: Recent Labs  Lab 09/17/2017 1340 09/14/2017 0517  NA 133* 133*  K 4.7 3.8  CL 90* 90*  CO2 21* 27  GLUCOSE 351* 35*  BUN 47* 69*  CREATININE 5.00* 5.74*  CALCIUM 9.2 8.6*  MG  --  2.5*   GFR: Estimated Creatinine Clearance: 9.6 mL/min (A) (by C-G formula based on SCr of 5.74 mg/dL (H)). Liver Function Tests: No results for input(s): AST, ALT, ALKPHOS, BILITOT, PROT, ALBUMIN in the last 168 hours. No results for input(s): LIPASE, AMYLASE in the last 168 hours. No results for input(s): AMMONIA in the last 168 hours. Coagulation Profile: Recent Labs  Lab 09/05/2017 0636  INR 1.46  Cardiac Enzymes: Recent Labs  Lab 09/04/2017 1819 09/26/17 0053 09/26/17 0704  TROPONINI 0.03* 0.08* 0.08*   BNP (last 3 results) No results for input(s): PROBNP in the last 8760 hours. HbA1C: Recent Labs    09/17/2017 1819  HGBA1C 8.2*   CBG: Recent Labs  Lab 09/26/17 1643 09/26/17 2129 09/26/17 2225 09/08/2017 0748 09/18/2017 0820  GLUCAP 100* 45* 109* 41* 61*   Lipid Profile: No results for input(s): CHOL, HDL, LDLCALC, TRIG, CHOLHDL, LDLDIRECT in the last 72 hours. Thyroid Function Tests: No results for input(s): TSH, T4TOTAL, FREET4, T3FREE, THYROIDAB in the last 72 hours. Anemia Panel: No  results for input(s): VITAMINB12, FOLATE, FERRITIN, TIBC, IRON, RETICCTPCT in the last 72 hours. Sepsis Labs: Recent Labs  Lab 10/02/2017 1520 09/20/2017 1539 09/22/2017 1819 09/26/2017 2336  LATICACIDVEN 7.58* 6.08* 2.7* 5.1*    Recent Results (from the past 240 hour(s))  Culture, blood (routine x 2)     Status: None (Preliminary result)   Collection Time: 10/01/2017  3:30 PM  Result Value Ref Range Status   Specimen Description BLOOD RIGHT FOREARM  Final   Special Requests   Final    BOTTLES DRAWN AEROBIC ONLY Blood Culture results may not be optimal due to an inadequate volume of blood received in culture bottles   Culture   Final    NO GROWTH < 24 HOURS Performed at [facility], [place] [address] [place], [place] [address]    Report Status PENDING  Incomplete  Culture, blood (routine x 2)     Status: None (Preliminary result)   Collection Time: 09/29/2017  3:51 PM  Result Value Ref Range Status   Specimen Description BLOOD RIGHT ANTECUBITAL  Final   Special Requests   Final    BOTTLES DRAWN AEROBIC ONLY Blood Culture results may not be optimal due to an inadequate volume of blood received in culture bottles   Culture   Final    NO GROWTH < 24 HOURS Performed at [facility], [place] [address], [place], [place] [address]    Report Status PENDING  Incomplete  MRSA PCR Screening     Status: None   Collection Time: 09/26/17 12:41 AM  Result Value Ref Range Status   MRSA by PCR NEGATIVE NEGATIVE Final    Comment:        The GeneXpert MRSA Assay (FDA approved for NASAL specimens only), is one component of a comprehensive MRSA colonization surveillance program. It is not intended to diagnose MRSA infection nor to guide or monitor treatment for MRSA infections. Performed at [facility], [place] [address], [place], [place] [address]          Radiology Studies: Dg Abdomen 1 View  Result Date: 09/08/2017 CLINICAL DATA:  Weakness, nonproductive cough for the past  2-3 days. History of coronary artery disease. EXAM: ABDOMEN - 1 VIEW COMPARISON:  Supine abdominal radiograph of August 18, 2017 FINDINGS: The bowel gas pattern within is within the limits of normal. The colonic stool burden is normal. There is mild distention of the rectum with stool. There are vascular calcifications present. The lumbar spine exhibits multilevel degenerative change. IMPRESSION: Persistent increased stool burden in the rectum worse worrisome for fecal impaction. No bowel obstruction or ileus. Electronically Signed   By: David  Martinique M.D.   On: 09/05/2017 14:18   Dg Chest Port 1 View  Result Date: 09/20/2017 CLINICAL DATA:  Cough and weakness for 2-3 days. EXAM: PORTABLE CHEST 1 VIEW COMPARISON:  08/17/2017 FINDINGS: Cardiac enlargement. Aortic atherosclerosis identified. There are bilateral pleural effusions identified right greater in left. No interstitial edema or airspace opacities. IMPRESSION: 1. Cardiac enlargement and bilateral pleural effusions. 2.  Aortic Atherosclerosis (ICD10-I70.0). Electronically Signed   By: Kerby Moors M.D.   On: 09/09/2017 14:19        Scheduled Meds: . allopurinol  300 mg Oral Daily  . atorvastatin  20 mg Oral q1800  . calcium acetate  667 mg Oral TID WC  . feeding supplement (NEPRO CARB STEADY)  237 mL Oral TID BM  . hydrocortisone  25 mg Rectal BID  . insulin aspart  0-9 Units Subcutaneous TID WC  . mupirocin cream  1 application Topical Daily  . nutrition supplement (JUVEN)  1 packet Oral BID BM  . omega-3 acid ethyl esters  1,000 mg Oral QHS  . pantoprazole  40 mg Oral Q0600   Continuous Infusions: . piperacillin-tazobactam (ZOSYN)  IV 3.375 g (09/16/2017 0540)  . vancomycin       LOS: 2 days    Time spent: 45 mins    Anabela Crayton Arsenio Loader, MD [facility] Pager [phone]   If 7PM-7AM, please contact night-coverage [email or web address] Password [facility] 10/01/2017, 8:52 AM

## 2017-09-27 NOTE — Progress Notes (Signed)
   09/27/2017 1100  Clinical Encounter Type  Visited With Patient  Visit Type Initial  Referral From Palliative care team  Consult/Referral To Chaplain  Spiritual Encounters  Spiritual Needs Emotional  Stress Factors  Patient Stress Factors Exhausted  Family Stress Factors Exhausted    I visited with Pt. Pt seemingly very sleepy. No family on-site at this time but a number of RR team on-site. Pt was able to respond to my greetings but went back to sleep. RR team said they had given him medicine. Chaplain will revisit when family on-site or when Pt is awake. Chaplain provided compassionate presence.  Sidni Fusco a Medical sales representative, [employer]

## 2017-09-27 NOTE — Consult Note (Signed)
PULMONARY / CRITICAL CARE MEDICINE   Name: Douglas Meyer MRN: [id number] DOB: 12/09/1940    ADMISSION DATE:  09/16/2017 CONSULTATION DATE:  09/22/2017  REFERRING MD:  [facility]  CHIEF COMPLAINT:  Hematochezia, hypotension and hypoglycemia  HISTORY OF PRESENT ILLNESS:        This is a 77 year old diabetic with end-stage renal disease who was started on dialysis in February.  He presented on 4/22 complaining of weakness.  He was moderately hypotensive and had a slight increase in his lactic acid.  This was attributed to infection surrounding a decubitus ulcer he was started on a combination of vancomycin and Zosyn.  Today we are asked to see the patient because he is passing blood per rectum, hypotensive, and hypoglycemic.  He tells me that he is not having any abdominal pain.  He denies a prior history of GI bleeding.  Does not appear to be on any antiplatelet agents or anticoagulants.  PAST MEDICAL HISTORY :  He  has a past medical history of Bilateral carotid artery disease ([place]), CAD (coronary artery disease) (05/15/10), Cancer ([place]), Diabetes mellitus, ED (erectile dysfunction), ESRD (end stage renal disease) on dialysis ([place]), Gout, History of kidney stones, cardiovascular stress test, Hyperlipidemia, Hypertension, Myocardial infarction [facility]), Nephrolithiasis, Polyp of colon, and PVD (peripheral vascular disease) ([place]).  PAST SURGICAL HISTORY: He  has a past surgical history that includes Humerus fracture surgery; Mohs surgery; Cystoscopy; Colonoscopy w/ polypectomy; Eye surgery (06/2013); Endarterectomy (Right, 11/28/2014); Patch angioplasty (Right, 11/28/2014); AV fistula placement (Right, 06/19/2015); Bascilic vein transposition (Left, 08/11/2016); Bascilic vein transposition (Left, 10/21/2016); and RIGHT/LEFT HEART CATH AND CORONARY ANGIOGRAPHY (N/A, 07/17/2017).  No Known Allergies  No current facility-administered medications on file prior to encounter.    Current Outpatient Medications on File  Prior to Encounter  Medication Sig  . allopurinol (ZYLOPRIM) 300 MG tablet TAKE ONE TABLET BY MOUTH ONCE DAILY  . atorvastatin (LIPITOR) 20 MG tablet TAKE ONE TABLET BY MOUTH ONCE DAILY AT  6  PM  . calcium acetate (PHOSLO) 667 MG capsule Take 1 capsule (667 mg total) by mouth 3 (three) times daily with meals.  . clopidogrel (PLAVIX) 75 MG tablet Take 1 tablet (75 mg total) by mouth daily.  . insulin glargine (LANTUS) 100 unit/mL SOPN Inject 28 Units into the skin at bedtime.   . metoprolol succinate (TOPROL-XL) 25 MG 24 hr tablet Take 25 mg by mouth daily.   . midodrine (PROAMATINE) 10 MG tablet Take 10 mg by mouth See admin instructions. Take 10 mg by mouth before dialysis three times a week  . UNABLE TO FIND Omega-3/vitamin B-12/vitamin B-6 capsule: Take 1 capsule by mouth once a day  . Ascorbic Acid (VITAMIN C) 500 MG tablet Take 500 mg by mouth at bedtime.   Marland Kitchen aspirin 81 MG chewable tablet Chew 1 tablet (81 mg total) by mouth daily.  . BD PEN NEEDLE NANO U/F 32G X 4 MM MISC  USE AS DIRECTED DAILY  . hydrocortisone (ANUSOL-HC) 25 MG suppository Place 1 suppository (25 mg total) rectally 2 (two) times daily.  . Lancets (ACCU-CHEK MULTICLIX) lancets 1 each by Other route daily. Dx 250.01  . Multiple Vitamins-Minerals (MULTIVITAMIN WITH MINERALS) tablet Take 1 tablet by mouth at bedtime.   . nitroGLYCERIN (NITROSTAT) 0.4 MG SL tablet Place 1 tablet (0.4 mg total) under the tongue every 5 (five) minutes as needed for up to 25 days for chest pain.  . Nutritional Supplements (FEEDING SUPPLEMENT, NEPRO CARB STEADY,) LIQD Take 237 mLs by mouth 3 (  three) times daily between meals.  . polyethylene glycol (MIRALAX / GLYCOLAX) packet Take 17 g by mouth daily as needed for moderate constipation.  . senna-docusate (SENOKOT-S) 8.6-50 MG tablet Take 2 tablets by mouth daily as needed for mild constipation.    FAMILY HISTORY:  His indicated that his mother is deceased. He indicated that his father is  deceased. He indicated that both of his sisters are deceased. He indicated that one of his three brothers is deceased. He indicated that his maternal grandmother is deceased. He indicated that his maternal grandfather is deceased. He indicated that his paternal grandmother is deceased. He indicated that his paternal grandfather is deceased.   SOCIAL HISTORY: He  reports that he quit smoking about 44 years ago. He quit after 6.00 years of use. He has never used smokeless tobacco. He reports that he drinks about 4.2 oz of alcohol per week. He reports that he does not use drugs.  REVIEW OF SYSTEMS:   No dyspnea.  No chest pain.  No cough.  No abdominal pain.  No prior history of GI bleeding.  SUBJECTIVE:  As above  VITAL SIGNS: BP (!) 85/52   Pulse 79   Temp 97.6 F (36.4 C) (Oral)   Resp 16   Ht 5' 5.5" (1.664 m)   Wt 141 lb 5 oz (64.1 kg)   SpO2 100%   BMI 23.16 kg/m   HEMODYNAMICS:    VENTILATOR SETTINGS:    INTAKE / OUTPUT: I/O last 3 completed shifts: In: 640 [P.O.:360; NG/GT:280] Out: 4 [Stool:4]  PHYSICAL EXAMINATION: General: Despite his relative hypotension and hypoglycemia he is alert and interactive. Neuro: He is oriented to month and place cannot tell me the exact day.  He is appropriately interactive and moving all fours on request Cardiovascular: Does not have JVD at present, S1 and S2 are regular without murmur rub or gallop, he has acral cyanosis. Lungs: Aspirations are unlabored, there is symmetric air movement a few scattered rhonchi, no dependent rales  abdomen: The abdomen is flat and soft without any organomegaly masses tenderness guarding or rebound.  There is no suprapubic tenderness.  I do not appreciate any external hemorrhoids.  He is passing a combination of maroon and bright red stool.   LABS:  BMET Recent Labs  Lab 09/26/2017 1340 09/30/2017 0517  NA 133* 133*  K 4.7 3.8  CL 90* 90*  CO2 21* 27  BUN 47* 69*  CREATININE 5.00* 5.74*  GLUCOSE  351* 35*    Electrolytes Recent Labs  Lab 09/17/2017 1340 09/17/2017 0517  CALCIUM 9.2 8.6*  MG  --  2.5*    CBC Recent Labs  Lab 09/16/2017 1340 09/26/17 0926 09/06/2017 0517 09/09/2017 0636  WBC 7.8 9.0 8.9  --   HGB 15.1 14.2 13.8 12.5*  HCT 44.4 41.6 40.8 36.1*  PLT 107* 99* 87*  --     Coag's Recent Labs  Lab 09/30/2017 0636  APTT 41*  INR 1.46    Sepsis Markers Recent Labs  Lab 09/24/2017 1539 09/07/2017 1819 10/03/2017 2336  LATICACIDVEN 6.08* 2.7* 5.1*    ABG No results for input(s): PHART, PCO2ART, PO2ART in the last 168 hours.  Liver Enzymes No results for input(s): AST, ALT, ALKPHOS, BILITOT, ALBUMIN in the last 168 hours.  Cardiac Enzymes Recent Labs  Lab 09/18/2017 1819 09/26/17 0053 09/26/17 0704  TROPONINI 0.03* 0.08* 0.08*    Glucose Recent Labs  Lab 10/02/2017 0820 09/30/2017 1014 09/07/2017 1130 09/22/2017 1132 09/04/2017 1155 09/27/17 1241  GLUCAP 61* 71 14* 22* 46* 143*    Imaging Ct Angio Abd/pel W/ And/or W/o  Result Date: 09/11/2017 CLINICAL DATA:  Lower GI bleed.  Renal failure, PVD, hypertension. EXAM: CTA ABDOMEN AND PELVIS WITH CONTRAST TECHNIQUE: Multidetector CT imaging of the abdomen and pelvis was performed using the standard protocol during bolus administration of intravenous contrast. Multiplanar reconstructed images and MIPs were obtained and reviewed to evaluate the vascular anatomy. CONTRAST:  133mL ISOVUE-370 IOPAMIDOL (ISOVUE-370) INJECTION 76% COMPARISON:  08/17/2017 FINDINGS: VASCULAR Aorta: Eccentric nonocclusive mural thrombus in the visualized distal descending thoracic segment. Extensive calcified atheromatous plaque. Mild tortuosity. No aneurysm, dissection, or stenosis. Celiac: Scattered nonocclusive calcified plaque. Distal branches unremarkable. SMA: Calcified plaque extends over multiple cm, without high-grade stenosis. Classic distal branch anatomy. Renals: Single bilaterally, both with calcified ostial plaque resulting in  short segment stenoses of possible hemodynamic significance. Patent distally. IMA: Calcified ostial plaque resulting in short segment stenosis, patent distally. Inflow: Ectatic, moderately tortuous, with extensive calcified plaque, no high-grade stenosis or dissection. Proximal Outflow: Moderate calcified nonocclusive plaque. Veins: Patent hepatic veins, portal vein, SMV, IMV, splenic vein. Prominent superior rectal veins. Review of the MIP images confirms the above findings. NON-VASCULAR Lower chest: Moderately large bilateral pleural effusions. Dependent atelectasis/consolidation posteriorly in both lung bases. No pericardial effusion. Hepatobiliary: No focal liver abnormality is seen. No gallstones, gallbladder wall thickening, or biliary dilatation. Pancreas: Unremarkable. No pancreatic ductal dilatation or surrounding inflammatory changes. Spleen: Normal in size without focal abnormality. Adrenals/Urinary Tract: Normal adrenals. Bilateral renal parenchymal thinning. 0.5 cm left lower pole calculus. No hydronephrosis or ureterectasis. Distended urinary bladder. Stomach/Bowel: Stomach incompletely distended, unremarkable. Small bowel decompressed. Normal appendix. The colon is nondilated proximally. Redundant sigmoid segment with scattered diverticula. Fecal dilatation of the rectum with prominent mucosal enhancement in the distal sigmoid and rectum circumferentially on delayed scans. No active extravasation into the bowel lumen. No vascular malformation or anomaly identified. Lymphatic: No abdominal or pelvic adenopathy. Reproductive: Prostate is unremarkable. Other: No ascites. No free air. No retroperitoneal or extraperitoneal hematoma. Musculoskeletal: No acute or significant osseous findings. IMPRESSION: VASCULAR 1. No evidence of active extravasation into the bowel, nor vascular lesion. 2. Extensive aortoiliac atherosclerosis involving visceral and renal branches as above. NON-VASCULAR 1. No retroperitoneal  or extraperitoneal hematoma. 2. Fecal dilatation of the rectum with mucosal enhancement suggesting colitis possibly stercoral. 3. Moderately large bilateral pleural effusions. 4. Sigmoid diverticulosis. Electronically Signed   By: Lucrezia Europe M.D.   On: 09/11/2017 13:01      CULTURES: Cultures drawn on 4/22 or so far sterile  ANTIBIOTICS: Vancomycin  and Zosyn   DISCUSSION:    This is a 77 year old diabetic with severe systolic dysfunction and end-stage renal disease who was admitted with generalized weakness, hypotension, and lactic acidosis initially attributed to potential cellulitis surrounding a decubitus ulcer.  He has subsequently developed a lower GI bleed and hypoglycemia.  ASSESSMENT / PLAN:  PULMONARY On the CT angiogram earlier today he had bilateral pleural effusions but otherwise does not appear to have active pulmonary disease specifically does not appear to have CHF on clinical examination.   CARDIOVASCULAR A: Unfortunately,  he appears to be globally hypoperfused with acral cyanosis.  He is having substantial ongoing blood loss and I have given him a small fluid bolus pending his most recent CBC to determine whether or not will be more appropriate to replace him with blood products.    GASTROINTESTINAL A: Lower GI bleeding.  I do not appreciate any external hemorrhoids,  the patient is not experiencing any abdominal pain to suggest that ischemia is the basis of the bleeding.  In any event the first step is volume resuscitation of the patient, GI has been consulted for evaluation.  I did not appreciate any blush on the CT angiogram that was performed earlier but I am awaiting interpretation by radiology.  There is no free air on that study to my eye, the study is impressive for area distended bladder.  HEMATOLOGIC A: I am monitoring serial CBCs, he is not overtly coagulopathic based on his INR however he does have a BUN of 69 and a relatively low platelet count, should  bleeding persist I will transfuse him with platelets and give a single dose of DDAVP until his leading is controlled.  INFECTIOUS A: Is not clear how much of his lactic acidosis is secondary to global hypoperfusion and how much potentially secondary to infection.  In addition to his decubitus ulceration I am concerned that he has a distended bladder full of stagnant urine and I have requested a culture of that material.  I am going to be making no change in his Comycin and Zosyn for the present  ENDOCRINE A: Hypoglycemia.  He is on no long-acting insulin.  I have requested a serum cortisol but fear that the hypoglycemia may be on the basis of overwhelming sepsis.  Empiric replacement with hydrocortisone has been ordered.  He continues vancomycin and Zosyn.  I have also sent liver function studies looking for other potential provocations of his hypoglycemia but with a relatively normal INR I suspect that an acute hepatic insult is not the culprit     Lars Masson, MD [place] Pager: [phone]  09/10/2017, 1:06 PM

## 2017-09-27 NOTE — Progress Notes (Signed)
Patient transferred to 2MW16 to higher level of care due to  active and symptomatic GI bleed with hypo glycemia and hypotension.  Family very supportive and in close attendance.  Transferred via bed and accompanied by two attendants.  Family introduced to unit.

## 2017-09-27 NOTE — Consult Note (Signed)
[facility] Renal Consultation Note    Indication for Consultation:  Management of ESRD/hemodialysis; anemia, hypertension/volume and secondary hyperparathyroidism  HPI: Douglas Meyer is a 77 y.o. male with ESRD on HD (MWF [place]) HD initiated 07/2017 during [facility] admit for NSTEMI with cath showing severe multivessel disease,  EF 25%, was not a candidate for further intervention. Medical management per cards. PMH also DM Type II, HTN, gout.   Recent [facility] admission 08/2017 with constipation and BRBPR felt related to hemorrhoids.   He was admitted Monday with generalized weakness and infected sacral decubitus ulcer. IV antibiotics started. Blood cultures, no growth to date. Hypotensive during admission. CXR with bilateral pleural effusions. Heavy stool burden on Abd x-ray. This morning having BRBPR with clots. GI consulted. Labs  K 3.8, Na 133, Cr 5.74, BUN 69, WBC 8.9, Hgb 12.5.   Seen in room with family present. They say he has been very weak and sleeping most of the day. He reports being tired, but denies CP, SOB, Abd pain, N,V. He does want to continue with dialysis.   Last dialysis was Friday at outpatient center. Missed Monday d/t hospital admission. Noted to have pitting edema, but low BP has been limiting amount of UF. SBPs usually 90s/100s at outpatient center.   Past Medical History:  Diagnosis Date  . Bilateral carotid artery disease ([place])   . CAD (coronary artery disease) 05/15/10   out of hospital myocardial infarction in the 1990's. this was picked up on an  EKG in 1997.  Cardiac catherterization demonstrated an occluded vessel and collateras.  I have no description of this.  A stress  perfusion study  done 2008 demonstrated an ejection fraction  of  53% with inferolateral  hypokinesis.  There was an inferolateral defect with  scar and some mild mixed ischemia.   managed rx  . Cancer ([place])    skin  . Diabetes mellitus    DM, type II  . ED  (erectile dysfunction)   . ESRD (end stage renal disease) on dialysis ([place])   . Gout   . History of kidney stones   . Hx of cardiovascular stress test    a. Lexiscan myoview 10/18/12: EF 53%, inf and inf-lat scar, slight peri-infarct ischemia, small area of apical ischemia; no change from 2006  . Hyperlipidemia   . Hypertension   . Myocardial infarction ([place])   . Nephrolithiasis   . Polyp of colon   . PVD (peripheral vascular disease) ([place])    Past Surgical History:  Procedure Laterality Date  . AV FISTULA PLACEMENT Right 06/19/2015   Procedure: ARTERIOVENOUS (AV) FISTULA CREATION;  Surgeon: Serafina Mitchell, MD;  Location: [place];  Service: Vascular;  Laterality: Right;  . BASCILIC VEIN TRANSPOSITION Left 08/11/2016   Procedure: BASCILIC VEIN TRANSPOSITION-LEFT 1ST STAGE;  Surgeon: Serafina Mitchell, MD;  Location: [place];  Service: Vascular;  Laterality: Left;  . BASCILIC VEIN TRANSPOSITION Left 10/21/2016   Procedure: LEFT ARM 2ND STAGE BASILIC VEIN TRANSPOSITION;  Surgeon: Serafina Mitchell, MD;  Location[facility];  Service: Vascular;  Laterality: Left;  . COLONOSCOPY W/ POLYPECTOMY    . CYSTOSCOPY     stone removal x 1  . ENDARTERECTOMY Right 11/28/2014   Procedure: RIGHT CAROTID ENDARTERECTOMY;  Surgeon: Serafina Mitchell, MD;  Location: [place];  Service: Vascular;  Laterality: Right;  . EYE SURGERY  06/2013   bilateral cataract surgery  . HUMERUS FRACTURE SURGERY     left  .  MOHS SURGERY     multiple  . PATCH ANGIOPLASTY Right 11/28/2014   Procedure: PATCH ANGIOPLASTY RIGHT CAROTID;  Surgeon: Serafina Mitchell, MD;  Location: [place];  Service: Vascular;  Laterality: Right;  . RIGHT/LEFT HEART CATH AND CORONARY ANGIOGRAPHY N/A 07/17/2017   Procedure: RIGHT/LEFT HEART CATH AND CORONARY ANGIOGRAPHY;  Surgeon: Jettie Booze, MD;  Location: [place] [facility];  Service: Cardiovascular;  Laterality: N/A;   Family History  Problem Relation Age of Onset  . Heart attack Father        deceased  92  . Diabetes Brother        rheumatic fever, peptic ulcer disease, deceased  . Diabetes Sister        deceased 61  . Heart attack Mother        deceased age 65s  . Diabetes type II Brother   . Diabetes type II Brother    Social History:  reports that he quit smoking about 44 years ago. He quit after 6.00 years of use. He has never used smokeless tobacco. He reports that he drinks about 4.2 oz of alcohol per week. He reports that he does not use drugs. No Known Allergies Prior to Admission medications   Medication Sig Start Date End Date Taking? Authorizing Provider  allopurinol (ZYLOPRIM) 300 MG tablet TAKE ONE TABLET BY MOUTH ONCE DAILY 06/07/17  Yes Dorena Cookey, MD  atorvastatin (LIPITOR) 20 MG tablet TAKE ONE TABLET BY MOUTH ONCE DAILY AT  6  PM 05/12/17  Yes Dorena Cookey, MD  calcium acetate (PHOSLO) 667 MG capsule Take 1 capsule (667 mg total) by mouth 3 (three) times daily with meals. 07/19/17  Yes Sheikh, Omair Latif, DO  clopidogrel (PLAVIX) 75 MG tablet Take 1 tablet (75 mg total) by mouth daily. 08/28/17  Yes Minus Breeding, MD  insulin glargine (LANTUS) 100 unit/mL SOPN Inject 28 Units into the skin at bedtime.    Yes [provider]  metoprolol succinate (TOPROL-XL) 25 MG 24 hr tablet Take 25 mg by mouth daily.    Yes [provider]  midodrine (PROAMATINE) 10 MG tablet Take 10 mg by mouth See admin instructions. Take 10 mg by mouth before dialysis three times a week   Yes [provider]  UNABLE TO FIND Omega-3/vitamin B-12/vitamin B-6 capsule: Take 1 capsule by mouth once a day   Yes [provider]  Ascorbic Acid (VITAMIN C) 500 MG tablet Take 500 mg by mouth at bedtime.     [provider]  aspirin 81 MG chewable tablet Chew 1 tablet (81 mg total) by mouth daily. 07/20/17   Kerney Elbe, DO  BD PEN NEEDLE NANO U/F 32G X 4 MM MISC  USE AS DIRECTED DAILY 04/25/17   Dorena Cookey, MD  hydrocortisone (ANUSOL-HC) 25 MG  suppository Place 1 suppository (25 mg total) rectally 2 (two) times daily. 08/18/17   Rosita Fire, MD  Lancets (ACCU-CHEK MULTICLIX) lancets 1 each by Other route daily. Dx 250.01 05/07/13   Dorena Cookey, MD  Multiple Vitamins-Minerals (MULTIVITAMIN WITH MINERALS) tablet Take 1 tablet by mouth at bedtime.     [provider]  nitroGLYCERIN (NITROSTAT) 0.4 MG SL tablet Place 1 tablet (0.4 mg total) under the tongue every 5 (five) minutes as needed for up to 25 days for chest pain. 07/28/17 10/01/2017  Barrett, Evelene Croon, PA-C  Nutritional Supplements (FEEDING SUPPLEMENT, NEPRO CARB STEADY,) LIQD Take 237 mLs by mouth 3 (three) times  daily between meals. 07/19/17   Raiford Noble Latif, DO  polyethylene glycol [facility] / GLYCOLAX) packet Take 17 g by mouth daily as needed for moderate constipation. 08/18/17   Rosita Fire, MD  senna-docusate (SENOKOT-S) 8.6-50 MG tablet Take 2 tablets by mouth daily as needed for mild constipation. 08/18/17   Rosita Fire, MD   Current Facility-Administered Medications  Medication Dose Route Frequency Provider Last Rate Last Dose  . acetaminophen (TYLENOL) tablet 650 mg  650 mg Oral Q6H PRN Rondel Jumbo, PA-C   650 mg at 09/26/17 1120   Or  . acetaminophen (TYLENOL) suppository 650 mg  650 mg Rectal Q6H PRN Rondel Jumbo, PA-C      . allopurinol (ZYLOPRIM) tablet 300 mg  300 mg Oral Daily Rondel Jumbo, PA-C   300 mg at 09/26/17 1119  . atorvastatin (LIPITOR) tablet 20 mg  20 mg Oral q1800 Rondel Jumbo, PA-C   20 mg at 09/26/17 1711  . bisacodyl (DULCOLAX) suppository 10 mg  10 mg Rectal Daily PRN Rondel Jumbo, PA-C      . calcium acetate (PHOSLO) capsule 667 mg  667 mg Oral TID WC Rondel Jumbo, PA-C   667 mg at 09/26/17 1711  . feeding supplement (NEPRO CARB STEADY) liquid 237 mL  237 mL Oral TID BM Rondel Jumbo, PA-C   237 mL at 09/26/17 2025  . hydrocortisone (ANUSOL-HC) suppository 25 mg  25 mg Rectal BID Rondel Jumbo, PA-C   25 mg at 09/26/17 2227  . insulin aspart (novoLOG) injection 0-9 Units  0-9 Units Subcutaneous TID WC Rondel Jumbo, PA-C   1 Units at 09/26/17 1154  . mupirocin cream (BACTROBAN) 2 % 1 application  1 application Topical Daily Amin, Jeanella Flattery, MD   1 application at 93/23/55 1119  . nutrition supplement (JUVEN) (JUVEN) powder packet 1 packet  1 packet Oral BID BM Amin, Jeanella Flattery, MD   1 packet at 09/26/17 1711  . omega-3 acid ethyl esters (LOVAZA) capsule 1,000 mg  1,000 mg Oral QHS Rondel Jumbo, PA-C   1,000 mg at 09/26/17 2237  . ondansetron (ZOFRAN) tablet 4 mg  4 mg Oral Q6H PRN Rondel Jumbo, PA-C       Or  . ondansetron (ZOFRAN) injection 4 mg  4 mg Intravenous Q6H PRN Shawn Route, Sara E, PA-C      . pantoprazole (PROTONIX) injection 40 mg  40 mg Intravenous Q12H Amin, Ankit Chirag, MD      . piperacillin-tazobactam (ZOSYN) IVPB 3.375 g  3.375 g Intravenous Q12H Rolla Flatten, RPH 12.5 mL/hr at 09/08/2017 0540 3.375 g at 09/14/2017 0540  . polyethylene glycol (MIRALAX / GLYCOLAX) packet 17 g  17 g Oral Daily PRN Rondel Jumbo, PA-C      . senna-docusate (Senokot-S) tablet 2 tablet  2 tablet Oral Daily PRN Rondel Jumbo, PA-C      . vancomycin (VANCOCIN) IVPB 750 mg/150 ml premix  750 mg Intravenous Q M,W,F-HD Rudisill, Tony L, RPH        ROS: As per HPI otherwise negative.  Physical Exam: Vitals:   09/30/2017 0536 09/08/2017 0555 09/17/2017 0623 09/26/2017 0656  BP: (!) 93/57 104/60 (!) 91/54 (!) 89/54  Pulse: 82 86 60 79  Resp: 18 18 18 18   Temp:      TempSrc:      SpO2: 100% 100% 100% 97%  Weight:      Height:  General: Frail ill-appearing elderly male NAD  Head: NCAT sclera not icteric MMM Neck:  No JVD No masses Lungs: Diminshed but CTAB  Heart: RRR with S1 S2 Abdomen: soft NT + BS Lower extremities: 1+ LE edema bilat Neuro: A & O  X 3. Moves all extremities spontaneously. Psych:  Responds to questions appropriately with a normal  affect. Dialysis Access: LUE AVF +bruit   Labs: Basic Metabolic Panel: Recent Labs  Lab 09/18/2017 1340 09/09/2017 0517  NA 133* 133*  K 4.7 3.8  CL 90* 90*  CO2 21* 27  GLUCOSE 351* 35*  BUN 47* 69*  CREATININE 5.00* 5.74*  CALCIUM 9.2 8.6*   Liver Function Tests: No results for input(s): AST, ALT, ALKPHOS, BILITOT, PROT, ALBUMIN in the last 168 hours. No results for input(s): LIPASE, AMYLASE in the last 168 hours. No results for input(s): AMMONIA in the last 168 hours. CBC: Recent Labs  Lab 09/21/2017 1340 09/26/17 0926 09/09/2017 0517 10/03/2017 0636  WBC 7.8 9.0 8.9  --   HGB 15.1 14.2 13.8 12.5*  HCT 44.4 41.6 40.8 36.1*  MCV 94.1 93.3 91.9  --   PLT 107* 99* 87*  --    Cardiac Enzymes: Recent Labs  Lab 09/23/2017 1819 09/26/17 0053 09/26/17 0704  TROPONINI 0.03* 0.08* 0.08*   CBG: Recent Labs  Lab 09/26/17 1643 09/26/17 2129 09/26/17 2225 09/18/2017 0748 09/22/2017 0820  GLUCAP 100* 45* 109* 41* 61*   Iron Studies: No results for input(s): IRON, TIBC, TRANSFERRIN, FERRITIN in the last 72 hours. Studies/Results: Dg Abdomen 1 View  Result Date: 10/03/2017 CLINICAL DATA:  Weakness, nonproductive cough for the past 2-3 days. History of coronary artery disease. EXAM: ABDOMEN - 1 VIEW COMPARISON:  Supine abdominal radiograph of August 18, 2017 FINDINGS: The bowel gas pattern within is within the limits of normal. The colonic stool burden is normal. There is mild distention of the rectum with stool. There are vascular calcifications present. The lumbar spine exhibits multilevel degenerative change. IMPRESSION: Persistent increased stool burden in the rectum worse worrisome for fecal impaction. No bowel obstruction or ileus. Electronically Signed   By: David  Martinique M.D.   On: 09/29/2017 14:18   Dg Chest Port 1 View  Result Date: 10/02/2017 CLINICAL DATA:  Cough and weakness for 2-3 days. EXAM: PORTABLE CHEST 1 VIEW COMPARISON:  08/17/2017 FINDINGS: Cardiac enlargement.  Aortic atherosclerosis identified. There are bilateral pleural effusions identified right greater in left. No interstitial edema or airspace opacities. IMPRESSION: 1. Cardiac enlargement and bilateral pleural effusions. 2.  Aortic Atherosclerosis (ICD10-I70.0). Electronically Signed   By: Kerby Moors M.D.   On: 10/01/2017 14:19    Dialysis Orders:  AF MWF 4h 180NRe 400/800 EDW 60kg 2K/2.25 Ca  L AVF No heparin  Assessment/Plan: 1. GI Bleed/BRBPR - GI consult pending/Follow trends. Hgb 12.5. Keep >8 with cardiac disease  2. Cellulitis/Sacral decubitus ulcer - IV Vanc/Zosyn per pharmacy. Blood cultures neg to date  3. CAD/ICM/EF 25% - per primary. Holding Plavix 4. ESRD -  MWF. Orders written for HD today. Patient and family wish to continue.  5. Hypotension /volume  - Does have edema on examChronic hypotension limits volume removal/UF to EDW as BP allows  6. Metabolic bone disease -  No VDRA/Ca acetate binder  7. DM2 - per primary  8. [facility] - Palliative consult pending   Lynnda Child PA-C [facility] Pager [phone] 09/05/2017, 8:50 AM

## 2017-09-27 NOTE — Progress Notes (Signed)
Patient unstable at this time.  Concerned re:  Hypoglycemia (14-41) requiring multiple boluses of D50%, hypotension (systolic upper 44'Q to 58'A), and generalized pallor with cyanotic nailbeds.  Family aware of patient's status, as is MD.  Status has been upgraded to SDU and currently awaiting bed.  Will continue supportive care and treat as symptoms warrant.  Closely monitoring.  Rapid response nurse also present.

## 2017-09-27 NOTE — Progress Notes (Signed)
In to change patient .Patient has large amount of bright red blood in bed with clots.Patient remains asymptomatic at this time .Text paged Forrest Moron NP awaiting response.

## 2017-09-27 NOTE — Consult Note (Signed)
Consultation Note Date: 09/10/2017   Patient Name: Douglas Meyer  DOB: May 08, 1941  MRN: [id number]  Age / Sex: 77 y.o., male  PCP: Dorena Cookey, MD Referring Physician: Damita Lack, MD  Reason for Consultation: Establishing goals of care, Non pain symptom management, Pain control and Psychosocial/spiritual support  HPI/Patient Profile: 77 y.o. male  with past medical history of ESRD on HD since 07/2017, PVD, CAD, CHF with EF of 25%, DM, bilateral carotid disease who was admitted on 09/24/2017 with lethargy and weakness - he was unable to go to HD on Monday.  On admission he was found to have a lactic acid of 6, hypoglycemia, fecal impaction and a sacral ulceration with cellulitis.  This morning he had GI bleeding.  Of note he has had 3 hospitalizations in the last 6 months.  He is a full code.  Clinical Assessment and Goals of Care:  I have reviewed medical records including EPIC notes, labs and imaging, received report from the care team, assessed the patient and then met at the bedside along with 2 of his four children  to discuss diagnosis prognosis, GOC, EOL wishes, disposition and options.  I introduced Palliative Medicine as specialized medical care for people living with serious illness. It focuses on providing relief from the symptoms and stress of a serious illness. The goal is to improve quality of life for both the patient and the family.  We discussed a brief life review of the patient.  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  He is Nurse, learning disability and a Set designer. [employer].  He was heavily involved in charity work with the [employer].  He has 4 children - most of them live in [place].  He was living at home with his wife prior to admission.  Per his children, the patient's wife now has memory problems (likely dementia) and is not able to care for him at  home.  As far as functional and nutritional status - Yvone Neu (his son) tells me that patient initially did well on hemodialysis, but after his hospitalization in March he has really declined.  He no longer has an appetite (and has lost 30 lbs).  He was not brushing his teeth at home because he could not stand up long enough to do so.  Yvone Neu described an episode of delirium that took place Monday morning 4/22 - when the patient was "talking completely out of his head".   We discussed the functional decline that often happens after patient's start HD.  We also discussed hypoglycemia as an indicator of poor prognosis in ESRD patients.  While I was in the room with the patient his clinical appearance changed.  His breathing became apneic, his color pale, he became cool to the touch and less responsive.  A cbg check revealed a blood sugar of 14.  We paused our [place] meeting and rapid response was called.  I spoke with the son and daughter a little further to attempt  to clarify code status and explain his current situation (CHF (EF 25%), ESRD, active GI bleed).  I explained that aggressive resuscitation would not be recommended in this situation.  The son and daughter felt the family's decision would be for DNR however they needed to speak with their mother (patient's wife) before making a change.     Questions and concerns were addressed.  The family was encouraged to call with questions or concerns.    Primary Decision Maker:  NEXT OF KIN wife.    SUMMARY OF RECOMMENDATIONS    Rapid Response and TRH attending were notified of the patient's clinical change An amp of D50 was given. D10 infusion was ordered. Stat type and cross with 2 units of RBCs was ordered. PMT will continue to follow with you and attempt to clarify code status.  Code Status/Advance Care Planning:  Full   Symptom Management:   Per primary team   Additional Recommendations (Limitations, Scope, Preferences):  Full Scope  Treatment  Psycho-social/Spiritual:   Desire for further Chaplaincy support: yes notified.  Family will contact [place] and consider whether or not they want him to have the Sacrament of the Sick at this time.  Prognosis:   He is at high risk of an acute event that could take his life at any time given severe hypoglycemia and GI bleed.  Pending how he progresses during this hospitalization his prognosis is certainly less than 6 months and could be days.  Discharge Planning: To Be Determined      Primary Diagnoses: Present on Admission: . Hyperlipemia . Gout . Essential hypertension . Non-ST elevation (NSTEMI) myocardial infarction ([place]) . Coronary atherosclerosis . Peripheral vascular disease ([place]) . Sensorineural hearing loss (SNHL) of both ears . Asymptomatic stenosis of right carotid artery . Anemia of chronic disease . Constipation   I have reviewed the medical record, interviewed the patient and family, and examined the patient. The following aspects are pertinent.  Past Medical History:  Diagnosis Date  . Bilateral carotid artery disease ([place])   . CAD (coronary artery disease) 05/15/10   out of hospital myocardial infarction in the 1990's. this was picked up on an  EKG in 1997.  Cardiac catherterization demonstrated an occluded vessel and collateras.  I have no description of this.  A stress  perfusion study  done 2008 demonstrated an ejection fraction  of  53% with inferolateral  hypokinesis.  There was an inferolateral defect with  scar and some mild mixed ischemia.   managed rx  . Cancer ([place])    skin  . Diabetes mellitus    DM, type II  . ED (erectile dysfunction)   . ESRD (end stage renal disease) on dialysis ([place])   . Gout   . History of kidney stones   . Hx of cardiovascular stress test    a. Lexiscan myoview 10/18/12: EF 53%, inf and inf-lat scar, slight peri-infarct ischemia, small area of apical ischemia; no change from 2006  . Hyperlipidemia   .  Hypertension   . Myocardial infarction ([place])   . Nephrolithiasis   . Polyp of colon   . PVD (peripheral vascular disease) ([place])    Social History   Socioeconomic History  . Marital status: Married    Spouse name: Not on file  . Number of children: 4  . Years of education: Not on file  . Highest education level: Not on file  Occupational History    Employer: [employer]    Comment: retired  Employer: RETIRED  Social Needs  . Financial resource strain: Not on file  . Food insecurity:    Worry: Not on file    Inability: Not on file  . Transportation needs:    Medical: Not on file    Non-medical: Not on file  Tobacco Use  . Smoking status: Former Smoker    Years: 6.00    Last attempt to quit: 06/06/1973    Years since quitting: 44.3  . Smokeless tobacco: Never Used  Substance and Sexual Activity  . Alcohol use: Yes    Alcohol/week: 4.2 oz    Types: 7 Cans of beer per week    Comment: 1 beer daily  . Drug use: No  . Sexual activity: Not on file  Lifestyle  . Physical activity:    Days per week: Not on file    Minutes per session: Not on file  . Stress: Not on file  Relationships  . Social connections:    Talks on phone: Not on file    Gets together: Not on file    Attends religious service: Not on file    Active member of club or organization: Not on file    Attends meetings of clubs or organizations: Not on file    Relationship status: Not on file  Other Topics Concern  . Not on file  Social History Narrative   Lives with wife.     Family History  Problem Relation Age of Onset  . Heart attack Father        deceased 55  . Diabetes Brother        rheumatic fever, peptic ulcer disease, deceased  . Diabetes Sister        deceased 18  . Heart attack Mother        deceased age 51s  . Diabetes type II Brother   . Diabetes type II Brother    Scheduled Meds: . allopurinol  300 mg Oral Daily  . atorvastatin  20 mg Oral q1800  . calcium acetate  667 mg Oral TID  WC  . dextrose      . feeding supplement (NEPRO CARB STEADY)  237 mL Oral TID BM  . hydrocortisone  25 mg Rectal BID  . insulin aspart  0-9 Units Subcutaneous TID WC  . iopamidol      . mupirocin cream  1 application Topical Daily  . nutrition supplement (JUVEN)  1 packet Oral BID BM  . omega-3 acid ethyl esters  1,000 mg Oral QHS  . [START ON 2017-10-26] pantoprazole  40 mg Oral Daily   Continuous Infusions: . sodium chloride    . dextrose    . piperacillin-tazobactam (ZOSYN)  IV Stopped (09/22/2017 0940)  . vancomycin     PRN Meds:.acetaminophen **OR** acetaminophen, bisacodyl, ondansetron **OR** ondansetron (ZOFRAN) IV, polyethylene glycol, senna-docusate No Known Allergies Review of Systems denied pain.  Too lethargic to give more ROS.  Physical Exam  Well developed elderly ill appearing male.  Very pale complected. CV:  Faint heart sounds, regular rhythm Resp apneic breathing on 2.5L Abdomen soft, nt, nd Extremities - able to move all 4, multiple bruises  Vital Signs: BP (!) 95/40 (BP Location: Right Arm)   Pulse 77   Temp 97.6 F (36.4 C) (Oral)   Resp 16   Ht 5' 5.5" (1.664 m)   Wt 64.1 kg (141 lb 5 oz)   SpO2 98%   BMI 23.16 kg/m  Pain Scale: 0-10 POSS *See Group Information*: S-Acceptable,Sleep,  easy to arouse Pain Score: 0-No pain   SpO2: SpO2: 98 % O2 Device:SpO2: 98 % O2 Flow Rate: .O2 Flow Rate (L/min): 2 L/min  IO: Intake/output summary:   Intake/Output Summary (Last 24 hours) at 09/11/2017 1136 Last data filed at 09/07/2017 0900 Gross per 24 hour  Intake 400 ml  Output 4 ml  Net 396 ml    LBM: Last BM Date: 09/10/2017 Baseline Weight: Weight: 64.1 kg (141 lb 5 oz) Most recent weight: Weight: 64.1 kg (141 lb 5 oz)     Palliative Assessment/Data: 20%     Time In: 10:00 Time Out: 11:30 Time Total: 90 min. Greater than 50%  of this time was spent counseling and coordinating care related to the above assessment and plan.  Discussed with [place]  attending, Endoscopy Unit, Bedside RN and Chaplain.  Signed by: Florentina Jenny, PA-C Palliative Medicine Pager: [phone]  Please contact Palliative Medicine Team phone at [phone] for questions and concerns.  For individual provider: See Shea Evans

## 2017-09-27 NOTE — Significant Event (Signed)
Rapid Response Event Note  Overview: Time Called: 6962 Arrival Time: 9528 Event Type: (GIB, low BS)  Initial Focused Assessment: Patient with continued GI bleeding this am,  Mod-large amount of blood and clots with each BM.  Blood sugar this am 7a- 10 ranging betweent 40-60 with 1 amp d50 given.  At 11:30am  Blood sugar 14, patient minimally responsive.  1amp D50 given IV.     Patient more responsive and easy to arouse.  BP 82/31 HR 81 RR 16-24  With periods of apnea  O2 sat 100% on 2L [place]   Interventions: Blood sugar rechecked CBG 46  Additional amp of D50 given IV. 500cc NS bolus Bp 85/52  HR 79 D 10 started at 50cc/hr Lung sounds coarse with expiratory wheezes. BM with stool and small amount of blood.  Rechecked BS 143 Patient family (son and daughter at bedside)  Patient transported to 49m16   Plan of Care (if not transferred):  Event Summary: Name of Physician Notified: Reesa Chew at bedside at    Name of Consulting Physician Notified: CCM consulted by Dr Reesa Chew at    Outcome: Transferred (Comment)     Raliegh Ip

## 2017-09-27 NOTE — Progress Notes (Signed)
PT Cancellation Note  Patient Details Name: Douglas Meyer MRN: [id number] DOB: 27-Jan-1941   Cancelled Treatment:    Reason Eval/Treat Not Completed: Medical issues which prohibited therapy   Noted pt lethargic with OT earlier today, and per most recent RN note, medically unstable; for transfer to SDU;   Will check back for appropriateness of PT tomorrow; Noted Palliative Care Team has been consulted;  Roney Marion, PT  Acute Rehabilitation Services Pager [phone] Office [phone]    Colletta [place] 09/19/2017, 12:56 PM

## 2017-09-27 NOTE — Progress Notes (Signed)
Inpatient Diabetes Program Recommendations  AACE/ADA: New Consensus Statement on Inpatient Glycemic Control (2015)  Target Ranges:  Prepandial:   less than 140 mg/dL      Peak postprandial:   less than 180 mg/dL (1-2 hours)      Critically ill patients:  140 - 180 mg/dL    Review of Glycemic Control  Diabetes history: DM 2 Outpatient Diabetes medications: Lantus 28 units Current orders for Inpatient glycemic control: Novolog Sensitive Correction 0-9 units tid  Inpatient Diabetes Program Recommendations:    Hypoglycemia in the 40's on Lantus 26 units. Lantus d/c'd for now. When glucose trends increase above 180 mg/dl consistently consider ordering a portion of home basal insulin (Lantus 8-10 units, 0.1-0.15 units/kg).  Thanks,  Tama Headings RN, MSN, BC-ADM, [facility] Inpatient Diabetes Coordinator Team Pager [phone] (8a-5p)

## 2017-09-27 NOTE — Op Note (Signed)
[facility] Patient Name: Douglas Meyer Procedure Date : 09/11/2017 MRN: [id number] Attending MD: Jerene Bears , MD Date of Birth: 05/11/1941 CSN: [id number] Age: 77 Admit Type: Inpatient Procedure:                Flexible Sigmoidoscopy Indications:              Rectal hemorrhage, Hematochezia Providers:                Lajuan Lines. Hilarie Fredrickson, MD, Vista Lawman, RN, Baird Cancer,                            RN, Elna Breslow, RN, Charolette Child, Technician,                            Alan Mulder, Technician Referring MD:             [facility] Medicines:                Fentanyl 25 micrograms IV, Midazolam 3 mg IV Complications:            No immediate complications. Estimated Blood Loss:     Estimated blood loss was minimal. Procedure:                Pre-Anesthesia Assessment:                           - Prior to the procedure, a History and Physical                            was performed, and patient medications and                            allergies were reviewed. The patient's tolerance of                            previous anesthesia was also reviewed. The risks                            and benefits of the procedure and the sedation                            options and risks were discussed with the patient.                            All questions were answered, and informed consent                            was obtained. Prior Anticoagulants: The patient has                            taken Plavix (clopidogrel), last dose was 1 day                            prior to procedure. ASA Grade Assessment: III - A  patient with severe systemic disease. After                            reviewing the risks and benefits, the patient was                            deemed in satisfactory condition to undergo the                            procedure.                           After obtaining informed consent, the scope was            passed under direct vision. The [id number])                            scope was introduced through the anus and advanced                            to the rectosigmoid junction. The flexible                            sigmoidoscopy was technically difficult and                            complex. The patient tolerated the procedure well.                            The quality of the bowel preparation was None. Scope In: Scope Out: Findings:      External hemorrhoid were found on perianal exam. This hemorrhoid was not       actively bleeding. The rectal exam was quite painful for the patient.      A very large amount of solid stool was found in the rectum, making       visualization difficult. This is consistent with what was seen by CT       scan, clinical history of constipation. Manual disimpaction performed.      A single (solitary) 5-6 mm ulcer was found in the anal canal at the       dentate line. An actively bleeding visible vessel was present. External       pressure was applied by digital examination which slowed the bleeding       temporarily. Area around the bleeding vessel was successfully injected       with 3 mL of a 1:10,000 solution of epinephrine for hemostasis. After       injection the bleeding stopped. Dr. Hulen Skains with trauma surgery was       available and present for some of this examination. There was       consideration for applying a thrombin patch to the anal canal, but given       cessation of bleeding with injection this was not done.      A few additional ulcers, stercoral, were found in the distal rectum. No       bleeding was present from these ulcerations. Impression:               -  Nonbleeding external hemorrhoid.                           - Stool in the rectum; manual disimpaction                            performed.                           - Actively bleeding stercoral anal canal                            ulceration. Injected with  epinephrine for                            hemostasis. No bleeding at the end of the case.                           - A few stercoral ulcers in the distal rectum (not                            bleeding today).                           - No specimens collected. Moderate Sedation:      Moderate (conscious) sedation was administered by the endoscopy nurse       and supervised by the endoscopist. The following parameters were       monitored: oxygen saturation, heart rate, blood pressure, and response       to care. Total physician intraservice time was 53 minutes. Recommendation:           - Return patient to ICU for ongoing care.                           - Monitor Hgb close, transfuse as needed.                           - Platelet transfusion ordered given Plavix use in                            patient with ESRD.                           - General surgery is following (Dr. Richarda Blade help                            greatly appreciated). If rebleeding, contact                            general surgery for consideration of thrombin patch.                           - Hold Plavix.                           - Aggressive bowel regimen for constipation and  constipation-induced stercoral ulcerations.                           - Discussed with patient's wife and 4 children                            after the procedure. Procedure Code(s):        --- Professional ---                           [phone], 61, Sigmoidoscopy, flexible; with control of                            bleeding, any method                           G0500, Moderate sedation services provided by the                            same physician or other qualified health care                            professional performing a gastrointestinal                            endoscopic service that sedation supports,                            requiring the presence of an independent trained                             observer to assist in the monitoring of the                            patient's level of consciousness and physiological                            status; initial 15 minutes of intra-service time;                            patient age 62 years or older (additional time may                            be reported with [phone], as appropriate)                           [phone], Moderate sedation services provided by the                            same physician or other qualified health care                            professional performing the diagnostic or  therapeutic service that the sedation supports,                            requiring the presence of an independent trained                            observer to assist in the monitoring of the                            patient's level of consciousness and physiological                            status; each additional 15 minutes intraservice                            time (List separately in addition to code for                            primary service)                           [phone], Moderate sedation services provided by the                            same physician or other qualified health care                            professional performing the diagnostic or                            therapeutic service that the sedation supports,                            requiring the presence of an independent trained                            observer to assist in the monitoring of the                            patient's level of consciousness and physiological                            status; each additional 15 minutes intraservice                            time (List separately in addition to code for                            primary service)                           [phone], Moderate sedation services provided by the                            same physician or other  qualified health care                             professional performing the diagnostic or                            therapeutic service that the sedation supports,                            requiring the presence of an independent trained                            observer to assist in the monitoring of the                            patient's level of consciousness and physiological                            status; each additional 15 minutes intraservice                            time (List separately in addition to code for                            primary service) Diagnosis Code(s):        --- Professional ---                           K64.9, Unspecified hemorrhoids                           K62.6, Ulcer of anus and rectum                           K62.5, Hemorrhage of anus and rectum                           K92.1, Melena (includes Hematochezia) CPT copyright 2017 [employer]. All rights reserved. The codes documented in this report are preliminary and upon coder review may  be revised to meet current compliance requirements. Jerene Bears, MD 09/17/2017 5:01:13 PM This report has been signed electronically. Number of Addenda: 0

## 2017-09-27 NOTE — Progress Notes (Signed)
Patient blood sugar dropped to 45 tonight.Patient asymptomatic refuses to eat snack tonight.Patient did not eat any dinner tonight and only drank 1/2 of nepro  Drink.Hypoglemia protocol used and 25 ml D50 given IV.Recheck of blood sugar up 109.Text paged Forrest Moron NP.No new orders received will continue to monitor patient.

## 2017-09-27 NOTE — Consult Note (Signed)
[facility] Consult: 8:19 AM 09/18/2017  LOS: 2 days    Referring Provider: Dr Reesa Chew  Primary Care Physician:  Dorena Cookey, MD Primary Gastroenterologist:  Dr. Deatra Ina     Reason for Consultation: Large volume hematochezia   HPI: Douglas Meyer is a 77 y.o. male.   PMH HTN.  DM2.  ESRD.  Right CEA 2016.  CHF.  07/17/17 cardiac cath: severe 3 V dz, mild pulm htn, EF 25%.  Deemed non-surgical candidate.  Plavix initiated 07/2017.   Initiated onto HD during NSTEMI admission of 2/7 - 07/19/17.   Anemia.  Squamous cell, basal and melanoma in situ, skin cancers.   2009 Colonoscopy, routine screening. 15 mm sigmoid, 2 mm cecal polyp (TA and TVA without HGD).   Did not respond to call back colonoscopy letter in 2011.      Admitted overnight 3/14 with fecal impaction and having missed HD twice. Hemorrhoid on DRE.  CT with large fecal impaction and surrounding inflammation in rectum c/w stercoral colitis.  Mild bleeding after disimpaction.  Treated with Anusol, MiraLAX, Senna, Colace.  dilalyzed and corrected mild hyperkalemia and mild lactic acid elevation.  Candida of groin treated with Diflucan.  Not clear which of the laxatives/stool softeners he is been getting but he does say that he stopped receiving the MiraLAX at some point.  He has been having problems with constipation.  Basically his wife puts an adult diaper on him and he has loose stools on occasion but there has not been any bleeding.  Admitted Monday with FTT, weakness, sacral decubitus and surrounding cellulitis.  Too weak to go to HD the previous Friday.  Elevated lactate.   KUB shows persistent fecal impaction without obstruction or ileus. Poor po intake.  RD staged pt with severe malnutrition.  Overnight began passing large amount of BRBPR.  Many episodes  so far.  BP soft in 90s /60s.  No tachycardia.   Hgb 13.8 >> 12.5 (0630) from yest to this AM.  Platelets 107 >> 87.  MCV 91.    No PPI PTA, started now on IV Protonix.  Plavix received yesterday but now stopped.        Past Medical History:  Diagnosis Date  . Bilateral carotid artery disease ([place])   . CAD (coronary artery disease) 05/15/10   out of hospital myocardial infarction in the 1990's. this was picked up on an  EKG in 1997.  Cardiac catherterization demonstrated an occluded vessel and collateras.  I have no description of this.  A stress  perfusion study  done 2008 demonstrated an ejection fraction  of  53% with inferolateral  hypokinesis.  There was an inferolateral defect with  scar and some mild mixed ischemia.   managed rx  . Cancer ([place])    skin  . Diabetes mellitus    DM, type II  . ED (erectile dysfunction)   . ESRD (end stage renal disease) on dialysis ([place])   . Gout   . History of kidney stones   . Hx of cardiovascular stress test  a. Lexiscan myoview 10/18/12: EF 53%, inf and inf-lat scar, slight peri-infarct ischemia, small area of apical ischemia; no change from 2006  . Hyperlipidemia   . Hypertension   . Myocardial infarction ([place])   . Nephrolithiasis   . Polyp of colon   . PVD (peripheral vascular disease) ([place])     Past Surgical History:  Procedure Laterality Date  . AV FISTULA PLACEMENT Right 06/19/2015   Procedure: ARTERIOVENOUS (AV) FISTULA CREATION;  Surgeon: Serafina Mitchell, MD;  Location: [place];  Service: Vascular;  Laterality: Right;  . BASCILIC VEIN TRANSPOSITION Left 08/11/2016   Procedure: BASCILIC VEIN TRANSPOSITION-LEFT 1ST STAGE;  Surgeon: Serafina Mitchell, MD;  Location: [place];  Service: Vascular;  Laterality: Left;  . BASCILIC VEIN TRANSPOSITION Left 10/21/2016   Procedure: LEFT ARM 2ND STAGE BASILIC VEIN TRANSPOSITION;  Surgeon: Serafina Mitchell, MD;  Location: [facility];  Service: Vascular;  Laterality: Left;  . COLONOSCOPY W/ POLYPECTOMY    .  CYSTOSCOPY     stone removal x 1  . ENDARTERECTOMY Right 11/28/2014   Procedure: RIGHT CAROTID ENDARTERECTOMY;  Surgeon: Serafina Mitchell, MD;  Location: [place];  Service: Vascular;  Laterality: Right;  . EYE SURGERY  06/2013   bilateral cataract surgery  . HUMERUS FRACTURE SURGERY     left  . MOHS SURGERY     multiple  . PATCH ANGIOPLASTY Right 11/28/2014   Procedure: PATCH ANGIOPLASTY RIGHT CAROTID;  Surgeon: Serafina Mitchell, MD;  Location: [place];  Service: Vascular;  Laterality: Right;  . RIGHT/LEFT HEART CATH AND CORONARY ANGIOGRAPHY N/A 07/17/2017   Procedure: RIGHT/LEFT HEART CATH AND CORONARY ANGIOGRAPHY;  Surgeon: Jettie Booze, MD;  Location: [facility];  Service: Cardiovascular;  Laterality: N/A;    Prior to Admission medications   Medication Sig Start Date End Date Taking? Authorizing Provider  allopurinol (ZYLOPRIM) 300 MG tablet TAKE ONE TABLET BY MOUTH ONCE DAILY 06/07/17  Yes Dorena Cookey, MD  atorvastatin (LIPITOR) 20 MG tablet TAKE ONE TABLET BY MOUTH ONCE DAILY AT  6  PM 05/12/17  Yes Dorena Cookey, MD  calcium acetate (PHOSLO) 667 MG capsule Take 1 capsule (667 mg total) by mouth 3 (three) times daily with meals. 07/19/17  Yes Sheikh, Omair Latif, DO  clopidogrel (PLAVIX) 75 MG tablet Take 1 tablet (75 mg total) by mouth daily. 08/28/17  Yes Minus Breeding, MD  insulin glargine (LANTUS) 100 unit/mL SOPN Inject 28 Units into the skin at bedtime.    Yes [provider]  metoprolol succinate (TOPROL-XL) 25 MG 24 hr tablet Take 25 mg by mouth daily.    Yes [provider]  midodrine (PROAMATINE) 10 MG tablet Take 10 mg by mouth See admin instructions. Take 10 mg by mouth before dialysis three times a week   Yes [provider]  UNABLE TO FIND Omega-3/vitamin B-12/vitamin B-6 capsule: Take 1 capsule by mouth once a day   Yes [provider]  Ascorbic Acid (VITAMIN C) 500 MG tablet Take 500 mg by mouth at bedtime.     [provider]  aspirin 81 MG chewable tablet Chew 1 tablet (81 mg total) by mouth daily. 07/20/17   Kerney Elbe, DO  BD PEN NEEDLE NANO U/F 32G X 4 MM MISC  USE AS DIRECTED DAILY 04/25/17   Dorena Cookey, MD  hydrocortisone (ANUSOL-HC) 25 MG suppository Place 1 suppository (25 mg total) rectally 2 (two) times daily. 08/18/17   Rosita Fire,  MD  Lancets (ACCU-CHEK MULTICLIX) lancets 1 each by Other route daily. Dx 250.01 05/07/13   Dorena Cookey, MD  Multiple Vitamins-Minerals (MULTIVITAMIN WITH MINERALS) tablet Take 1 tablet by mouth at bedtime.     [provider]  nitroGLYCERIN (NITROSTAT) 0.4 MG SL tablet Place 1 tablet (0.4 mg total) under the tongue every 5 (five) minutes as needed for up to 25 days for chest pain. 07/28/17 09/06/2017  Barrett, Evelene Croon, PA-C  Nutritional Supplements (FEEDING SUPPLEMENT, NEPRO CARB STEADY,) LIQD Take 237 mLs by mouth 3 (three) times daily between meals. 07/19/17   Raiford Noble Latif, DO  polyethylene glycol [facility] / GLYCOLAX) packet Take 17 g by mouth daily as needed for moderate constipation. 08/18/17   Rosita Fire, MD  senna-docusate (SENOKOT-S) 8.6-50 MG tablet Take 2 tablets by mouth daily as needed for mild constipation. 08/18/17   Rosita Fire, MD    Scheduled Meds: . allopurinol  300 mg Oral Daily  . atorvastatin  20 mg Oral q1800  . calcium acetate  667 mg Oral TID WC  . feeding supplement (NEPRO CARB STEADY)  237 mL Oral TID BM  . hydrocortisone  25 mg Rectal BID  . insulin aspart  0-9 Units Subcutaneous TID WC  . mupirocin cream  1 application Topical Daily  . nutrition supplement (JUVEN)  1 packet Oral BID BM  . omega-3 acid ethyl esters  1,000 mg Oral QHS  . pantoprazole (PROTONIX) IV  40 mg Intravenous Q12H   Infusions: . piperacillin-tazobactam (ZOSYN)  IV 3.375 g (09/10/2017 0540)  . vancomycin     PRN Meds: acetaminophen **OR** acetaminophen, bisacodyl, ondansetron **OR** ondansetron (ZOFRAN)  IV, polyethylene glycol, senna-docusate   Allergies as of 09/09/2017  . (No Known Allergies)    Family History  Problem Relation Age of Onset  . Heart attack Father        deceased 36  . Diabetes Brother        rheumatic fever, peptic ulcer disease, deceased  . Diabetes Sister        deceased 24  . Heart attack Mother        deceased age 80s  . Diabetes type II Brother   . Diabetes type II Brother     Social History   Socioeconomic History  . Marital status: Married    Spouse name: Not on file  . Number of children: 4  . Years of education: Not on file  . Highest education level: Not on file  Occupational History    Employer: [employer]    Comment: retired    Fish farm manager: RETIRED  Social Needs  . Financial resource strain: Not on file  . Food insecurity:    Worry: Not on file    Inability: Not on file  . Transportation needs:    Medical: Not on file    Non-medical: Not on file  Tobacco Use  . Smoking status: Former Smoker    Years: 6.00    Last attempt to quit: 06/06/1973    Years since quitting: 44.3  . Smokeless tobacco: Never Used  Substance and Sexual Activity  . Alcohol use: Yes    Alcohol/week: 4.2 oz    Types: 7 Cans of beer per week    Comment: 1 beer daily  . Drug use: No  . Sexual activity: Not on file  Lifestyle  . Physical activity:    Days per week: Not on file    Minutes per session: Not on file  .  Stress: Not on file  Relationships  . Social connections:    Talks on phone: Not on file    Gets together: Not on file    Attends religious service: Not on file    Active member of club or organization: Not on file    Attends meetings of clubs or organizations: Not on file    Relationship status: Not on file  . Intimate partner violence:    Fear of current or ex partner: Not on file    Emotionally abused: Not on file    Physically abused: Not on file    Forced sexual activity: Not on file  Other Topics Concern  . Not on file  Social History  Narrative   Lives with wife.      REVIEW OF SYSTEMS: Constitutional: Weakness. ENT:  No nose bleeds Pulm: Cough. CV:  No palpitations, no LE edema.  No chest pain GU:  No hematuria, no frequency GI: Generally poor appetite.  No nausea or vomiting.  No abdominal pain. Heme: Usual bleeding is the rectal bleeding. Transfusions: None Neuro:  No headaches, no peripheral tingling or numbness.  Seizures or syncope. Derm:  No itching, no rash or sores.  Endocrine:  No sweats or chills.  No polyuria or dysuria Immunization: Vaccinations reviewed.  I do not see a current flu shot. Travel:  None beyond local counties in last few months.    PHYSICAL EXAM: Vital signs in last 24 hours: Vitals:   09/23/2017 0623 09/09/2017 0656  BP: (!) 91/54 (!) 89/54  Pulse: 60 79  Resp: 18 18  Temp:    SpO2: 100% 97%   Wt Readings from Last 3 Encounters:  09/26/17 141 lb 5 oz (64.1 kg)  08/18/17 139 lb 1.8 oz (63.1 kg)  08/09/17 148 lb (67.1 kg)    General: Frail, weak, ill looking WM. Head: Temporal wasting.  No signs of head trauma. Eyes: No scleral icterus.  No conjunctival pallor. Ears: No obvious hearing deficit. Nose: No discharge or congestion. Mouth: Poor dentition.  Oral mucosa moist, pink.  Tongue midline. Neck: No JVD, no masses. Lungs: No labored breathing or cough.  Clear bilaterally. Heart: RRR.  No MRG.  S1, S2 present. Abdomen: Thin, soft, nontender.  No masses, HSM, bruits, hernias.  Bowel sounds active..   Rectal: Large volume of deep red clotted, thick blood emerging actively from rectum.  Soft but large amount of stool in the rectal vault.  No masses.  Some tenderness with DRE. Musc/Skeltl: No joint redness or swelling. Extremities: No CCE.  Limbs are thin with muscle wasting. Neurologic: Alert.  Appropriate.  Oriented x3.  Moves all 4 limbs.  No tremors.  Limb strength not tested. Skin: No rashes, telangiectasia.  Sacral decubitus   Psych: Pleasant, cooperative.  Affect  blunted.  Intake/Output from previous day: 04/23 0701 - 04/24 0700 In: 640 [P.O.:360; NG/GT:280] Out: 4 [Stool:4] Intake/Output this shift: No intake/output data recorded.  LAB RESULTS: Recent Labs    09/04/2017 1340 09/26/17 0926 09/17/2017 0517 09/26/2017 0636  WBC 7.8 9.0 8.9  --   HGB 15.1 14.2 13.8 12.5*  HCT 44.4 41.6 40.8 36.1*  PLT 107* 99* 87*  --    BMET Lab Results  Component Value Date   NA 133 (L) 09/05/2017   NA 133 (L) 08/18/2017   NA 130 (L) 08/18/2017   K 4.7 10/03/2017   K 4.8 08/18/2017   K 4.9 08/18/2017   CL 90 (L) 10/01/2017   CL 91 (  L) 08/18/2017   CL 91 (L) 08/18/2017   CO2 21 (L) 09/30/2017   CO2 23 08/18/2017   CO2 17 (L) 08/18/2017   GLUCOSE 351 (H) 10/02/2017   GLUCOSE 174 (H) 08/18/2017   GLUCOSE 182 (H) 08/18/2017   BUN 47 (H) 09/07/2017   BUN 83 (H) 08/18/2017   BUN 74 (H) 08/18/2017   CREATININE 5.00 (H) 09/16/2017   CREATININE 7.41 (H) 08/18/2017   CREATININE 7.19 (H) 08/18/2017   CALCIUM 9.2 09/18/2017   CALCIUM 9.1 08/18/2017   CALCIUM 9.0 08/18/2017   LFT No results for input(s): PROT, ALBUMIN, AST, ALT, ALKPHOS, BILITOT, BILIDIR, IBILI in the last 72 hours. PT/INR Lab Results  Component Value Date   INR 1.46 09/26/2017   INR 1.13 07/17/2017   INR 1.07 11/20/2014   Hepatitis Panel No results for input(s): HEPBSAG, HCVAB, HEPAIGM, HEPBIGM in the last 72 hours. C-Diff No components found for: CDIFF Lipase     Component Value Date/Time   LIPASE 58 11/15/2007 1413    Drugs of Abuse  No results found for: LABOPIA, COCAINSCRNUR, LABBENZ, AMPHETMU, THCU, LABBARB   RADIOLOGY STUDIES: Dg Abdomen 1 View  Result Date: 09/17/2017 CLINICAL DATA:  Weakness, nonproductive cough for the past 2-3 days. History of coronary artery disease. EXAM: ABDOMEN - 1 VIEW COMPARISON:  Supine abdominal radiograph of August 18, 2017 FINDINGS: The bowel gas pattern within is within the limits of normal. The colonic stool burden is normal. There  is mild distention of the rectum with stool. There are vascular calcifications present. The lumbar spine exhibits multilevel degenerative change. IMPRESSION: Persistent increased stool burden in the rectum worse worrisome for fecal impaction. No bowel obstruction or ileus. Electronically Signed   By: David  Martinique M.D.   On: 09/19/2017 14:18   Dg Chest Port 1 View  Result Date: 09/22/2017 CLINICAL DATA:  Cough and weakness for 2-3 days. EXAM: PORTABLE CHEST 1 VIEW COMPARISON:  08/17/2017 FINDINGS: Cardiac enlargement. Aortic atherosclerosis identified. There are bilateral pleural effusions identified right greater in left. No interstitial edema or airspace opacities. IMPRESSION: 1. Cardiac enlargement and bilateral pleural effusions. 2.  Aortic Atherosclerosis (ICD10-I70.0). Electronically Signed   By: Kerby Moors M.D.   On: 09/11/2017 14:19     IMPRESSION:   *   Large volume hematochezia Fecal impaction and stercoral colitis with minor BPR 5 weeks ago.  Ongoing fecal impaction per KUB.   Adenomatous colon polyps in 2011, no colonoscopy since then.  Noncontrast CT last month did not show suspicious colonic masses.  *   ESRD.  Started HD in 07/2017  *  3 V CAD.  Non surgical candidate.  CHF.  NSTEMI 07/2017.   Plavix started 07/2017.  On hold.  Last dose 4/23.    *  FTT.  Malnutrition.  Sacral decub and cellulitis.  On Vanc, Zosyn.    *   IDDM.     PLAN:     *  No need of IV Protonix in LGIB, switched to po.  No PPI PTA.    *Discussed case with Dr. Hilarie Fredrickson.  Ordered stat CT angio of abdomen pelvis to try to localize the bleeding.  If this localizes within the distal colon, plan to proceed to flexible sigmoidoscopy with attempt at hemostasis this afternoon.  *  Keep off Plavix as doing.  Should we attempt rapid reversal of Plavix?, FFP etc??   Azucena Freed  09/06/2017, 8:19 AM Phone [phone]

## 2017-09-27 NOTE — Evaluation (Signed)
Occupational Therapy Evaluation Patient Details Name: Douglas Meyer MRN: [id number] DOB: 1940-09-02 Today's Date: 09/18/2017    History of Present Illness Pt is a 77 year old man admitted 09/26/2017 with generalized weakness and inability to go to his scheduled HD appointment so he was brought to the ED. Hospitalization complicated by BRBPR 5/62/13, sacral decubitus concerning for infection. PMH: ESRD with recent initiation of HD, PVD, CAD, HTN, gout.   Clinical Impression   Per son and daughter who are bedside, pt sleeps a lot and has been requiring more and more assistance for ADL since his hospitalization in February, prior to that he was independent. Pt typically manages his own medical care, but has been unable to recall MD names and no longer keeps accurate records so family feels like there is great confusion about pt's medical condition. Pt's son reports there were 2 [place] agencies providing nursing care inadvertently. Pt presents with lethargy, limiting participation. Family is eager to have a palliative care conference. Will follow acutely.    Follow Up Recommendations  SNF    Equipment Recommendations  3 in 1 bedside commode    Recommendations for Other Services       Precautions / Restrictions Precautions Precautions: Fall Precaution Comments: currently on 2L 02 Restrictions Weight Bearing Restrictions: No      Mobility Bed Mobility Overal bed mobility: Needs Assistance Bed Mobility: Rolling Rolling: Max assist         General bed mobility comments: pt positioned on his L side for pressure relief  Transfers                 General transfer comment: deferred due to lethargy    Balance                                           ADL either performed or assessed with clinical judgement   ADL Overall ADL's : Needs assistance/impaired Eating/Feeding: Bed level;Minimal assistance   Grooming: Minimal assistance;Bed level;Wash/dry  hands;Wash/dry face   Upper Body Bathing: Maximal assistance;Bed level   Lower Body Bathing: Total assistance;Bed level   Upper Body Dressing : Minimal assistance;Bed level   Lower Body Dressing: Total assistance;Bed level                       Vision Patient Visual Report: No change from baseline       Perception     Praxis      Pertinent Vitals/Pain Pain Assessment: Faces Faces Pain Scale: No hurt     Hand Dominance Right   Extremity/Trunk Assessment Upper Extremity Assessment Upper Extremity Assessment: Generalized weakness   Lower Extremity Assessment Lower Extremity Assessment: Defer to PT evaluation       Communication Communication Communication: No difficulties   Cognition Arousal/Alertness: Lethargic Behavior During Therapy: WFL for tasks assessed/performed Overall Cognitive Status: Difficult to assess                                 General Comments: per daughter, pt usually manages his own medical appointments and records his vitals and medical information, but his has been unable since about February.   General Comments       Exercises     Shoulder Instructions      Home Living Family/patient expects to be discharged to:: Private  residence Living Arrangements: Spouse/significant other Available Help at Discharge: Family;Available 24 hours/day(elderly wife) Type of Home: House Home Access: Stairs to enter [employer] of Steps: 3 Entrance Stairs-Rails: None Home Layout: One level     Bathroom Shower/Tub: Walk-in shower;Tub/shower unit   Bathroom Toilet: Standard     Home Equipment: Environmental consultant - 2 wheels;Cane - single point          Prior Functioning/Environment Level of Independence: Independent        Comments: pt with steady decline since initiation of HD, per son, he has been sleeping a lot and becoming more dependent on his wife for care, there is much confusion regarding his medical care         OT Problem List: Decreased strength;Decreased activity tolerance;Impaired balance (sitting and/or standing);Decreased knowledge of use of DME or AE      OT Treatment/Interventions: Self-care/ADL training;DME and/or AE instruction;Patient/family education;Balance training;Therapeutic activities    OT Goals(Current goals can be found in the care plan section) Acute Rehab OT Goals Patient Stated Goal: to get stronger and go home OT Goal Formulation: With patient/family Time For Goal Achievement: 10/11/17 Potential to Achieve Goals: Good ADL Goals Pt Will Perform Grooming: with set-up;sitting Pt Will Perform Upper Body Bathing: with min assist;sitting Pt Will Perform Upper Body Dressing: with min assist;sitting Pt Will Transfer to Toilet: with mod assist;stand pivot transfer;bedside commode Pt Will Perform Toileting - Clothing Manipulation and hygiene: with min assist;sit to/from stand Additional ADL Goal #1: Pt will perform bed mobility with moderate assistance in preparation for ADL.  OT Frequency: Min 2X/week   Barriers to D/C: Decreased caregiver support(wife with limited ability to assist with ADL)          Co-evaluation              AM-PAC PT "6 Clicks" Daily Activity     Outcome Measure Help from another person eating meals?: A Little Help from another person taking care of personal grooming?: A Little Help from another person toileting, which includes using toliet, bedpan, or urinal?: Total Help from another person bathing (including washing, rinsing, drying)?: A Lot Help from another person to put on and taking off regular upper body clothing?: A Little Help from another person to put on and taking off regular lower body clothing?: Total 6 Click Score: 13   End of Session    Activity Tolerance: Patient limited by lethargy Patient left: in bed;with call bell/phone within reach;with family/visitor present  OT Visit Diagnosis: Muscle weakness (generalized)  (M62.81)                Time: 1050-1110 OT Time Calculation (min): 20 min Charges:  OT General Charges $OT Visit: 1 Visit OT Evaluation $OT Eval Moderate Complexity: 1 Mod G-Codes:     Malka So 09/09/2017, 11:23 AM  09/26/2017 Nestor Lewandowsky, OTR/L Pager: [phone]

## 2017-09-27 NOTE — Significant Event (Signed)
Rapid Response Event Note  Called at: 4081 Arrived at: 35  Overview: Notified by Bosie Helper for pt with bleeding from the rectum.  Reportedly pt was seen on hourly rounds at 0445 and when turning the patient he had a large amount of bloody stool with clots.  Tylene Fantasia APP notified prior to my arrival.     Initial Focused Assessment:  Upon arrival, pt was asleep in the bed.  He woke easily and denies SOB, any pain and abdominal cramping.  He is pale and warm to the touch.  Cap refill less than 3 secs.  When we turned Douglas Meyer, he had another large bloody stool with large clots.  Approximately 250-300 cc of bloody stool.  He is incontinent and is continuously leaking bloody stool.  HR 82, BP 98/63, RR 16 with sats 100% on 2L [place].  Pt's son is at bedside. Interventions: -CBC (ordered by Tylene Fantasia APP)  Plan of Care (if not transferred): - Monitor for further blood loss -Notify primary svc and RR if pt has further clinical decompensations -q1 VS     Madelynn Done

## 2017-09-27 NOTE — Progress Notes (Signed)
0445 In patient room to change patient has been incontinent of stool this shift.Upon turning patient on his side started to have pouring of bright red blood from rectum.Patient alert and oriented not symptomatic at this this time.Vital signs as follows Temp 97.5 axillary HR 85 RR 18 B/P 97/61 oxygen saturations 100% on 2 liters nasal canula.Text paged Forrest Moron NP and paged rapid response nurse to come see patient.

## 2017-09-27 NOTE — Progress Notes (Signed)
Called by the palliative care team earlier this afternoon as patient appeared very pale, lethargic and hypotensive for evaluation of the patient.  His blood sugar at that time was 14 therefore 1 amp of D50 was given. When I arrived at the bedside patient did not have any new complaints but he clearly appeared very somnolent with systolic blood pressure in low 80s, heart rate in 80s.  He was saturating well on 2 L nasal cannula.  He still continued to have slow ongoing bright red blood per rectum but denied any abdominal pain.  After getting 2 ampoules of D50 blood glucose improved 240. Patient had just returned from the CTA therefore the results were reviewed which did not show any acute active GI bleeding but did show possible stair coral colitis, moderate large bilateral pleural effusion and sigmoid diverticulosis.  At this time stat labs were ordered to check his hemoglobin and 2 units of PRBC was ordered he was given 500 cc normal saline bolus but his blood pressure still persisted and systolic of 56E.  Overall appeared very ill.  Spoke with gastroenterology team who plans on performing sigmoidoscopy today.  Also requested critical care team to evaluate the patient, critical care team evaluated the patient and was taken to the ICU for closer care and monitoring.  During this time patient's family was at bedside including daughter, son and wife who I spoke with in great extent including his current ongoing care and goals of life.  At this time family would like everything to be done as long as it is treatable but at any point if his condition becomes futile they will be available for further conversation.  Appreciate care from critical care medicine, palliative care, nephrology and gastroenterology.  Please call with further questions as necessary.  Ankit Arsenio Loader, MD [facility] Pager [phone]   If 7PM-7AM, please contact night-coverage [email or web address] Password [email or web address] 09/24/2017,  2:06 PM

## 2017-09-27 NOTE — Progress Notes (Signed)
Call received from Forrest Moron NP notified of change in patient condition.Plan to get morning labs and continue to monitor patient for now.

## 2017-09-28 LAB — TYPE AND SCREEN
ABO/RH(D): O NEG
ANTIBODY SCREEN: NEGATIVE
UNIT DIVISION: 0
UNIT DIVISION: 0
UNIT DIVISION: 0
Unit division: 0

## 2017-09-28 LAB — BPAM RBC
BLOOD PRODUCT EXPIRATION DATE: 201905252359
Blood Product Expiration Date: 201905022359
Blood Product Expiration Date: 201905232359
Blood Product Expiration Date: 201905252359
ISSUE DATE / TIME: 201904241447
ISSUE DATE / TIME: 201904241516
UNIT TYPE AND RH: 9500
Unit Type and Rh: 9500
Unit Type and Rh: 9500
Unit Type and Rh: 9500

## 2017-09-28 LAB — CBC
HEMATOCRIT: 32.4 % — AB (ref 39.0–52.0)
Hemoglobin: 11 g/dL — ABNORMAL LOW (ref 13.0–17.0)
MCH: 30.1 pg (ref 26.0–34.0)
MCHC: 34 g/dL (ref 30.0–36.0)
MCV: 88.8 fL (ref 78.0–100.0)
PLATELETS: 77 10*3/uL — AB (ref 150–400)
RBC: 3.65 MIL/uL — ABNORMAL LOW (ref 4.22–5.81)
RDW: 17.2 % — AB (ref 11.5–15.5)
WBC: 9.5 10*3/uL (ref 4.0–10.5)

## 2017-09-28 LAB — GLUCOSE, CAPILLARY
Glucose-Capillary: 14 mg/dL — CL (ref 65–99)
Glucose-Capillary: 55 mg/dL — ABNORMAL LOW (ref 65–99)

## 2017-09-28 LAB — BASIC METABOLIC PANEL
Anion gap: 19 — ABNORMAL HIGH (ref 5–15)
BUN: 72 mg/dL — AB (ref 6–20)
CHLORIDE: 98 mmol/L — AB (ref 101–111)
CO2: 16 mmol/L — AB (ref 22–32)
CREATININE: 5.59 mg/dL — AB (ref 0.61–1.24)
Calcium: 7.8 mg/dL — ABNORMAL LOW (ref 8.9–10.3)
GFR calc Af Amer: 10 mL/min — ABNORMAL LOW (ref >60)
GFR calc non Af Amer: 9 mL/min — ABNORMAL LOW (ref >60)
GLUCOSE: 76 mg/dL (ref 65–99)
POTASSIUM: 4.8 mmol/L (ref 3.5–5.1)
Sodium: 133 mmol/L — ABNORMAL LOW (ref 135–145)

## 2017-09-28 LAB — BPAM PLATELET PHERESIS
Blood Product Expiration Date: 201904242359
ISSUE DATE / TIME: 201904241615
Unit Type and Rh: 2800

## 2017-09-28 LAB — PREPARE PLATELET PHERESIS: Unit division: 0

## 2017-09-28 LAB — MAGNESIUM: Magnesium: 2.1 mg/dL (ref 1.7–2.4)

## 2017-09-28 MED ORDER — MORPHINE SULFATE (PF) 4 MG/ML IV SOLN
INTRAVENOUS | Status: AC
  Administered 2017-09-28: 8 mg
  Filled 2017-09-28: qty 2

## 2017-09-28 MED ORDER — ALBUMIN HUMAN 5 % IV SOLN
12.5000 g | Freq: Once | INTRAVENOUS | Status: DC
  Filled 2017-09-28: qty 250

## 2017-09-28 MED FILL — Medication: Qty: 1 | Status: AC

## 2017-09-30 LAB — CULTURE, BLOOD (ROUTINE X 2)
Culture: NO GROWTH
Culture: NO GROWTH

## 2017-10-04 ENCOUNTER — Telehealth: Payer: Self-pay | Admitting: *Deleted

## 2017-10-04 NOTE — Progress Notes (Signed)
Arrived to pt room to find pt unresponsive, with upward gaze deviation and agonal breathing. Initiated code blue and began bagging pt while unit RN's began CPR. Code Blue initiated and ACLS protocol followed for pulseless pt in PEA. CPR with ACLS continued until MD called off code at time of death 55. MD present to pronounce.

## 2017-10-04 NOTE — Progress Notes (Signed)
Chaplain responded to Code Blue. Patient expired with youngest son present. Chaplain stayed with him until older brother arrived. Chaplain led them back into the room from comfort room.

## 2017-10-04 NOTE — Progress Notes (Signed)
eLink Physician-Brief Progress Note Patient Name: Bronx Brogden DOB: Jan 29, 1941 MRN: [id number]   Date of Service  10-20-17  HPI/Events of Note  Joined with code blue was in progress with CPR  eICU Interventions  Several rounds of epi and bicarb given, d50 also given for bs of 55. PEA rhythm all along. After more than 16 mins on ACLS protocol, patient was declared death after failure of return of spontaneous circulation as well as absence of spontaneous heart sounds or breath sounds. Spoke to son on the phone who agreed to stopping CPR in absence of dopplerable pulse.      Intervention Category Major Interventions: Code management / supervision  Sharia Reeve 2017-10-20, 5:27 AM

## 2017-10-04 NOTE — Progress Notes (Signed)
PCCM INTERVAL PROGRESS NOTE   Called to bedside by RN. Patient with ongoing hypotension despite phenylephrine. Concern BP measurements (on leg) not entirely accurate. Offered arterial line to patient. He would not like to have line placed. Attempted to use ClearSight finger cuff, but could not obtain a reading.   Have discussed with patient's son, who feels the patient is competent to refuse this aspect of care.   He arouses easily and follows commands/answers appropriately. Making some urine. Seems to be clinically perfusing. Will give albumin x 1. Will continue to monitor.    Georgann Housekeeper, AGACNP-BC [facility] Pulmonology/Critical Care Pager [phone] or [phone]  Oct 19, 2017 4:43 AM

## 2017-10-04 NOTE — Death Summary Note (Signed)
  Name: Caley Volkert MRN: [id number] DOB: [birth year] 77 y.o.  Date of Admission: 09/16/2017  1:30 PM Date of Discharge: 2017/10/02 Attending Physician: Damita Lack, MD  Discharge Diagnosis: Principal Problem:   Generalized weakness Active Problems:   Insulin dependent diabetes mellitus ([place])   Hyperlipemia   Gout   Essential hypertension   Non-ST elevation (NSTEMI) myocardial infarction [facility])   Coronary atherosclerosis   Peripheral vascular disease ([place])   Asymptomatic stenosis of right carotid artery   Sensorineural hearing loss (SNHL) of both ears   Constipation   End stage renal failure on dialysis (HCC)   Anemia of chronic disease   Wound of sacral region   Pressure injury of skin   Protein-calorie malnutrition, severe   Hypoglycemia   Gastrointestinal hemorrhage   Palliative care encounter   Hematochezia   Acute blood loss anemia   Rectal/anal ulcer   Cause of death: Complications of septic and hemorrhagic shock  Time of death: 0525  Disposition and follow-up:   Mr.Norvin Humiston was discharged from [facility] in expired condition.    Hospital Course: Jarryn Altland was a 77 yo male with PMH of severe, obstructive CAD, HFrEF with EF 25%, ESRD recently started on HD 07/2017, and T2DM who presented to [facility] ED with generalized weakness and hypotension on 4/22 thought to be sepsis from decubitus ulcer and he was started on broad spectrum antibiotics. On HD#2 he acutely decompensated. He became hypotensive and was noted to have profuse hematochezia. He was then transferred to the ICU where he underwent aggressive fluid resuscitation and receive 3 units of blood. GI performed flex sig at bedside which showed an actively bleeding stercoral ulcer. Hemostasis was achieved and patient stabilized. However, later that day he became hypotensive again requiring pressors and went into PEA arrest. He was coded for 16 minutes with no ROSC and he was pronounced  dead on 10/03/2022 at 0525.   Signed: Welford Roche, MD October 02, 2017, 6:39 AM

## 2017-10-04 NOTE — Discharge Summary (Signed)
Physician Discharge Summary  Sotero Brinkmeyer ZOX:[id number] DOB: [birth year] DOA: 09/26/2017  PCP: Dorena Cookey, MD  Admit date: 09/29/2017 Discharge date: 10-21-2017  Admitted From: Home Disposition:  Deceased  Brief/Interim Summary: 77 year old male with a history of peripheral vascular disease, CAD, essential hypertension, hyperlipidemia, diabetes mellitus type 2, ESRD on hemodialysis since February 2019 was brought to the hospital for evaluation of progressive weakness and lethargy.  Patient was noted to be very weak yesterday therefore did not go to his hemodialysis and was brought to the ED. in the ER patient was noted to have slightly elevated lactic acid and soft blood pressure.  Concerns had infected sacral decubitus ulcer therefore started on vancomycin and Zosyn. His stay was complicated by LGIB therefore GI was consulted.   Gastroenterology decided to perform sigmoidoscopy on the patient but prior to that CT of the abdomen pelvis was ordered which did not show any acute active GI bleeding but showed colitis, moderate large bilateral pleural effusion and sigmoid diverticulosis. Also on the afternoon of September 27, 2017 patient appeared very pale lethargic and became hypotensive.  His blood glucose was noted to be 14 therefore D50 was given with improved blood glucose to 43 therefore he was given another ampule.  He still remained hypotensive therefore he was given another liter of 500 cc bolus.  Due to persistence of the low blood pressure critical care team was consulted especially in the setting of active GI bleed and him being a dialysis patient.  Patient was moved to the stepdown unit for closer monitoring.  Later he underwent sigmoidoscopy.  Patient had ulcerated area with visible active bleeding.  Family was informed about his critical condition and were aware about his poor prognosis. Palliative care team was following as well.  Unfortunately overnight patient became hypotensive again  despite of getting phenylephrine and critical care provider offered arterial line but family and the patient refused.  And per record eventually underwent PEA arrest and ACLS protocol was carried out for 16 minutes and eventually CPR was stopped. Time of death was called at 5:25 AM as documented in the records.   I was not present overnight during these sequence of events therefore referred to the documentation for details of this.   Discharge Diagnoses:  Principal Problem:   Generalized weakness Active Problems:   Insulin dependent diabetes mellitus (HCC)   Hyperlipemia   Gout   Essential hypertension   Non-ST elevation (NSTEMI) myocardial infarction [facility])   Coronary atherosclerosis   Peripheral vascular disease (HCC)   Asymptomatic stenosis of right carotid artery   Sensorineural hearing loss (SNHL) of both ears   Constipation   End stage renal failure on dialysis (HCC)   Anemia of chronic disease   Wound of sacral region   Pressure injury of skin   Protein-calorie malnutrition, severe   Hypoglycemia   Gastrointestinal hemorrhage   Palliative care encounter   Hematochezia   Acute blood loss anemia   Rectal/anal ulcer    Discharge Instructions   Allergies as of 10/21/2017   No Known Allergies     Medication List    ASK your doctor about these medications   accu-chek multiclix lancets 1 each by Other route daily. Dx 250.01   allopurinol 300 MG tablet Commonly known as:  ZYLOPRIM TAKE ONE TABLET BY MOUTH ONCE DAILY   aspirin 81 MG chewable tablet Chew 1 tablet (81 mg total) by mouth daily.   atorvastatin 20 MG tablet Commonly known as:  LIPITOR TAKE ONE TABLET  BY MOUTH ONCE DAILY AT  6  PM   BD PEN NEEDLE NANO U/F 32G X 4 MM Misc Generic drug:  Insulin Pen Needle USE AS DIRECTED DAILY   calcium acetate 667 MG capsule Commonly known as:  PHOSLO Take 1 capsule (667 mg total) by mouth 3 (three) times daily with meals.   clopidogrel 75 MG tablet Commonly  known as:  PLAVIX Take 1 tablet (75 mg total) by mouth daily.   feeding supplement (NEPRO CARB STEADY) Liqd Take 237 mLs by mouth 3 (three) times daily between meals.   hydrocortisone 25 MG suppository Commonly known as:  ANUSOL-HC Place 1 suppository (25 mg total) rectally 2 (two) times daily.   insulin glargine 100 unit/mL Sopn Commonly known as:  LANTUS Inject 28 Units into the skin at bedtime.   metoprolol succinate 25 MG 24 hr tablet Commonly known as:  TOPROL-XL Take 25 mg by mouth daily.   midodrine 10 MG tablet Commonly known as:  PROAMATINE Take 10 mg by mouth See admin instructions. Take 10 mg by mouth before dialysis three times a week   multivitamin with minerals tablet Take 1 tablet by mouth at bedtime.   nitroGLYCERIN 0.4 MG SL tablet Commonly known as:  NITROSTAT Place 1 tablet (0.4 mg total) under the tongue every 5 (five) minutes as needed for up to 25 days for chest pain.   polyethylene glycol packet Commonly known as:  MIRALAX / GLYCOLAX Take 17 g by mouth daily as needed for moderate constipation.   senna-docusate 8.6-50 MG tablet Commonly known as:  Senokot-S Take 2 tablets by mouth daily as needed for mild constipation.   UNABLE TO FIND Omega-3/vitamin B-12/vitamin B-6 capsule: Take 1 capsule by mouth once a day   vitamin C 500 MG tablet Commonly known as:  ASCORBIC ACID Take 500 mg by mouth at bedtime.       No Known Allergies  You were cared for by a hospitalist during your hospital stay. If you have any questions about your discharge medications or the care you received while you were in the hospital after you are discharged, you can call the unit and asked to speak with the hospitalist on call if the hospitalist that took care of you is not available. Once you are discharged, your primary care physician will handle any further medical issues. Please note that no refills for any discharge medications will be authorized once you are discharged,  as it is imperative that you return to your primary care physician (or establish a relationship with a primary care physician if you do not have one) for your aftercare needs so that they can reassess your need for medications and monitor your lab values.  Consultations:  Critical care medicine  Gastroenterology  Palliative Care   Procedures/Studies: Dg Abdomen 1 View  Result Date: 09/26/2017 CLINICAL DATA:  Weakness, nonproductive cough for the past 2-3 days. History of coronary artery disease. EXAM: ABDOMEN - 1 VIEW COMPARISON:  Supine abdominal radiograph of August 18, 2017 FINDINGS: The bowel gas pattern within is within the limits of normal. The colonic stool burden is normal. There is mild distention of the rectum with stool. There are vascular calcifications present. The lumbar spine exhibits multilevel degenerative change. IMPRESSION: Persistent increased stool burden in the rectum worse worrisome for fecal impaction. No bowel obstruction or ileus. Electronically Signed   By: David  Martinique M.D.   On: 09/06/2017 14:18   Dg Chest Port 1 View  Result Date: 09/18/2017 CLINICAL  DATA:  Cough and weakness for 2-3 days. EXAM: PORTABLE CHEST 1 VIEW COMPARISON:  08/17/2017 FINDINGS: Cardiac enlargement. Aortic atherosclerosis identified. There are bilateral pleural effusions identified right greater in left. No interstitial edema or airspace opacities. IMPRESSION: 1. Cardiac enlargement and bilateral pleural effusions. 2.  Aortic Atherosclerosis (ICD10-I70.0). Electronically Signed   By: Kerby Moors M.D.   On: 09/16/2017 14:19   Ct Angio Abd/pel W/ And/or W/o  Result Date: 09/29/2017 CLINICAL DATA:  Lower GI bleed.  Renal failure, PVD, hypertension. EXAM: CTA ABDOMEN AND PELVIS WITH CONTRAST TECHNIQUE: Multidetector CT imaging of the abdomen and pelvis was performed using the standard protocol during bolus administration of intravenous contrast. Multiplanar reconstructed images and MIPs were  obtained and reviewed to evaluate the vascular anatomy. CONTRAST:  181mL ISOVUE-370 IOPAMIDOL (ISOVUE-370) INJECTION 76% COMPARISON:  08/17/2017 FINDINGS: VASCULAR Aorta: Eccentric nonocclusive mural thrombus in the visualized distal descending thoracic segment. Extensive calcified atheromatous plaque. Mild tortuosity. No aneurysm, dissection, or stenosis. Celiac: Scattered nonocclusive calcified plaque. Distal branches unremarkable. SMA: Calcified plaque extends over multiple cm, without high-grade stenosis. Classic distal branch anatomy. Renals: Single bilaterally, both with calcified ostial plaque resulting in short segment stenoses of possible hemodynamic significance. Patent distally. IMA: Calcified ostial plaque resulting in short segment stenosis, patent distally. Inflow: Ectatic, moderately tortuous, with extensive calcified plaque, no high-grade stenosis or dissection. Proximal Outflow: Moderate calcified nonocclusive plaque. Veins: Patent hepatic veins, portal vein, SMV, IMV, splenic vein. Prominent superior rectal veins. Review of the MIP images confirms the above findings. NON-VASCULAR Lower chest: Moderately large bilateral pleural effusions. Dependent atelectasis/consolidation posteriorly in both lung bases. No pericardial effusion. Hepatobiliary: No focal liver abnormality is seen. No gallstones, gallbladder wall thickening, or biliary dilatation. Pancreas: Unremarkable. No pancreatic ductal dilatation or surrounding inflammatory changes. Spleen: Normal in size without focal abnormality. Adrenals/Urinary Tract: Normal adrenals. Bilateral renal parenchymal thinning. 0.5 cm left lower pole calculus. No hydronephrosis or ureterectasis. Distended urinary bladder. Stomach/Bowel: Stomach incompletely distended, unremarkable. Small bowel decompressed. Normal appendix. The colon is nondilated proximally. Redundant sigmoid segment with scattered diverticula. Fecal dilatation of the rectum with prominent  mucosal enhancement in the distal sigmoid and rectum circumferentially on delayed scans. No active extravasation into the bowel lumen. No vascular malformation or anomaly identified. Lymphatic: No abdominal or pelvic adenopathy. Reproductive: Prostate is unremarkable. Other: No ascites. No free air. No retroperitoneal or extraperitoneal hematoma. Musculoskeletal: No acute or significant osseous findings. IMPRESSION: VASCULAR 1. No evidence of active extravasation into the bowel, nor vascular lesion. 2. Extensive aortoiliac atherosclerosis involving visceral and renal branches as above. NON-VASCULAR 1. No retroperitoneal or extraperitoneal hematoma. 2. Fecal dilatation of the rectum with mucosal enhancement suggesting colitis possibly stercoral. 3. Moderately large bilateral pleural effusions. 4. Sigmoid diverticulosis. Electronically Signed   By: Lucrezia Europe M.D.   On: 09/20/2017 13:01      Subjective:   Discharge Exam: Vitals:   2017-10-21 0415 10-21-17 0500  BP: 98/71 (!) 112/93  Pulse: 90 72  Resp: (!) 35 12  Temp:    SpO2: 100% (!) 65%   Vitals:   10-21-17 0400 2017-10-21 0415 2017/10/21 0500 21-Oct-2017 0535  BP: (!) 85/40 98/71 (!) 112/93   Pulse: 86 90 72   Resp: (!) 35 (!) 35 12   Temp:      TempSrc:      SpO2: 100% 100% (!) 65%   Weight:    64.8 kg (142 lb 13.7 oz)  Height:        Not examined by me  The results of significant diagnostics from this hospitalization (including imaging, microbiology, ancillary and laboratory) are listed below for reference.     Microbiology: Recent Results (from the past 240 hour(s))  Culture, blood (routine x 2)     Status: None (Preliminary result)   Collection Time: 09/09/2017  3:30 PM  Result Value Ref Range Status   Specimen Description BLOOD RIGHT FOREARM  Final   Special Requests   Final    BOTTLES DRAWN AEROBIC ONLY Blood Culture results may not be optimal due to an inadequate volume of blood received in culture bottles   Culture    Final    NO GROWTH 2 DAYS Performed at [facility], [place] [address] [place], [place] [address]    Report Status PENDING  Incomplete  Culture, blood (routine x 2)     Status: None (Preliminary result)   Collection Time: 09/29/2017  3:51 PM  Result Value Ref Range Status   Specimen Description BLOOD RIGHT ANTECUBITAL  Final   Special Requests   Final    BOTTLES DRAWN AEROBIC ONLY Blood Culture results may not be optimal due to an inadequate volume of blood received in culture bottles   Culture   Final    NO GROWTH 2 DAYS Performed at [facility], [place] [address], [place], [place] [address]    Report Status PENDING  Incomplete  MRSA PCR Screening     Status: None   Collection Time: 09/26/17 12:41 AM  Result Value Ref Range Status   MRSA by PCR NEGATIVE NEGATIVE Final    Comment:        The GeneXpert MRSA Assay (FDA approved for NASAL specimens only), is one component of a comprehensive MRSA colonization surveillance program. It is not intended to diagnose MRSA infection nor to guide or monitor treatment for MRSA infections. Performed at [facility], [place] [address], [place], [place] [address]      Labs: BNP (last 3 results) No results for input(s): BNP in the last 8760 hours. Basic Metabolic Panel: Recent Labs  Lab 10/02/2017 1340 09/24/2017 0517 09/23/2017 1325 Oct 24, 2017 0300  NA 133* 133* 130* 133*  K 4.7 3.8 4.2 4.8  CL 90* 90* 93* 98*  CO2 21* 27 23 16*  GLUCOSE 351* 35* 229* 76  BUN 47* 69* 71* 72*  CREATININE 5.00* 5.74* 5.79* 5.59*  CALCIUM 9.2 8.6* 7.4* 7.8*  MG  --  2.5*  --  2.1   Liver Function Tests: Recent Labs  Lab 09/30/2017 1325  AST 266*  ALT 332*  ALKPHOS 268*  BILITOT 2.0*  PROT 3.5*  ALBUMIN 1.6*   No results for input(s): LIPASE, AMYLASE in the last 168 hours. No results for input(s): AMMONIA in the last 168 hours. CBC: Recent Labs  Lab 09/13/2017 1340 09/26/17 0926 09/17/2017 0517 09/20/2017 0636 09/23/2017 1325  09/21/2017 1920 24-Oct-2017 0300  WBC 7.8 9.0 8.9  --  7.2  --  9.5  NEUTROABS  --   --   --   --  6.1  --   --   HGB 15.1 14.2 13.8 12.5* 9.1* 10.9* 11.0*  HCT 44.4 41.6 40.8 36.1* 26.5* 32.0* 32.4*  MCV 94.1 93.3 91.9  --  91.4  --  88.8  PLT 107* 99* 87*  --  73*  --  77*   Cardiac Enzymes: Recent Labs  Lab 09/26/2017 1819 09/26/17 0053 09/26/17 0704  TROPONINI 0.03* 0.08* 0.08*   BNP: Invalid input(s): POCBNP CBG: Recent  Labs  Lab 09/12/2017 1241 09/06/2017 1423 09/17/2017 1630 10/03/2017 2017 10/19/17 0509  GLUCAP 143* 152* 162* 134* 55*   D-Dimer No results for input(s): DDIMER in the last 72 hours. Hgb A1c Recent Labs    09/04/2017 1819  HGBA1C 8.2*   Lipid Profile No results for input(s): CHOL, HDL, LDLCALC, TRIG, CHOLHDL, LDLDIRECT in the last 72 hours. Thyroid function studies No results for input(s): TSH, T4TOTAL, T3FREE, THYROIDAB in the last 72 hours.  Invalid input(s): FREET3 Anemia work up No results for input(s): VITAMINB12, FOLATE, FERRITIN, TIBC, IRON, RETICCTPCT in the last 72 hours. Urinalysis    Component Value Date/Time   COLORURINE YELLOW 07/12/2017 2104   APPEARANCEUR CLEAR 07/12/2017 2104   LABSPEC 1.011 07/12/2017 2104   PHURINE 5.0 07/12/2017 2104   GLUCOSEU NEGATIVE 07/12/2017 2104   HGBUR NEGATIVE 07/12/2017 2104   HGBUR negative 06/28/2010 0834   BILIRUBINUR NEGATIVE 07/12/2017 2104   BILIRUBINUR n 04/19/2016 1205   KETONESUR NEGATIVE 07/12/2017 2104   PROTEINUR NEGATIVE 07/12/2017 2104   UROBILINOGEN 0.2 04/19/2016 1205   UROBILINOGEN 0.2 11/20/2014 1509   NITRITE NEGATIVE 07/12/2017 2104   LEUKOCYTESUR NEGATIVE 07/12/2017 2104   Sepsis Labs Invalid input(s): PROCALCITONIN,  WBC,  LACTICIDVEN Microbiology Recent Results (from the past 240 hour(s))  Culture, blood (routine x 2)     Status: None (Preliminary result)   Collection Time: 09/15/2017  3:30 PM  Result Value Ref Range Status   Specimen Description BLOOD RIGHT FOREARM  Final    Special Requests   Final    BOTTLES DRAWN AEROBIC ONLY Blood Culture results may not be optimal due to an inadequate volume of blood received in culture bottles   Culture   Final    NO GROWTH 2 DAYS Performed at [facility], [place] [address], [place], [place] [address]    Report Status PENDING  Incomplete  Culture, blood (routine x 2)     Status: None (Preliminary result)   Collection Time: 09/24/2017  3:51 PM  Result Value Ref Range Status   Specimen Description BLOOD RIGHT ANTECUBITAL  Final   Special Requests   Final    BOTTLES DRAWN AEROBIC ONLY Blood Culture results may not be optimal due to an inadequate volume of blood received in culture bottles   Culture   Final    NO GROWTH 2 DAYS Performed at [facility], [place] [address], [place], [place] [address]    Report Status PENDING  Incomplete  MRSA PCR Screening     Status: None   Collection Time: 09/26/17 12:41 AM  Result Value Ref Range Status   MRSA by PCR NEGATIVE NEGATIVE Final    Comment:        The GeneXpert MRSA Assay (FDA approved for NASAL specimens only), is one component of a comprehensive MRSA colonization surveillance program. It is not intended to diagnose MRSA infection nor to guide or monitor treatment for MRSA infections. Performed at [facility], [place] [address], [place], [place] [address]      Time coordinating discharge: 35 mins  SIGNED:   Damita Lack, MD  [facility] 10/19/17, 2:00 PM Pager   If 7PM-7AM, please contact night-coverage [email or web address] Password [email or web address]

## 2017-10-04 NOTE — Code Documentation (Signed)
CODE BLUE NOTE  Patient Name: Douglas Meyer   MRN: [id number]   Date of Birth/ Sex: Oct 12, 1940 , male      Admission Date: 09/30/2017  Attending Provider: Damita Lack, MD  Primary Diagnosis: Fecal impaction ([place]) [K56.41] End stage renal failure on dialysis ([place]) [N18.6, Z99.2] SIRS (systemic inflammatory response syndrome) ([place]) [R65.10] Wound of sacral region, initial encounter [S31.000A] Severe comorbid illness [R69]    Indication: Pt was in his usual state of health until this PM, when he was noted to be PEA. Code blue was subsequently called. At the time of arrival on scene, ACLS protocol was underway.   Technical Description:  - CPR performance duration:  16 minutes  - Was defibrillation or cardioversion used? No   - Was external pacer placed? No  - Was patient intubated pre/post CPR? Yes    Medications Administered: Y = Yes; Blank = No Amiodarone    Atropine    Calcium    Epinephrine  Y  Lidocaine    Magnesium    Norepinephrine  Y  Phenylephrine    Sodium bicarbonate  Y  Vasopressin    D5 Y    Post CPR evaluation:  - Final Status - Was patient successfully resuscitated ? No   Miscellaneous Information:  - Time of death:  27 AM  - Primary team notified?  Yes  - Family Notified? Sherrill Raring, DO   2017/10/16, 5:33 AM

## 2017-10-04 NOTE — Telephone Encounter (Signed)
Katie with [facility] calling to check status of [employer] recert paperwork that was faxed last week. She will re-fax in case it was not received. Please complete form and fax back.

## 2017-10-04 DEATH — deceased

## 2017-10-06 ENCOUNTER — Telehealth: Payer: Self-pay | Admitting: Podiatry

## 2017-10-06 NOTE — Telephone Encounter (Signed)
The request from [facility] to complete the paperwork was signed and faxed back to [facility]

## 2017-10-16 ENCOUNTER — Ambulatory Visit: Payer: Medicare Other | Admitting: Cardiovascular Disease

## 2017-10-16 ENCOUNTER — Other Ambulatory Visit (HOSPITAL_COMMUNITY): Payer: Medicare Other

## 2017-11-16 ENCOUNTER — Encounter (HOSPITAL_COMMUNITY): Payer: Medicare Other

## 2017-11-16 ENCOUNTER — Ambulatory Visit: Payer: Medicare Other | Admitting: Vascular Surgery

## 2017-11-30 ENCOUNTER — Ambulatory Visit: Payer: Medicare Other | Admitting: Podiatry

## 2018-01-11 ENCOUNTER — Ambulatory Visit: Payer: Medicare Other

## 2019-04-10 IMAGING — US US RENAL
1 series · 14 of 25 positions shown · non-contrast
Comparison: None.

CLINICAL DATA: Acute onset of renal failure. Chronic renal disease.

EXAM:
RENAL / URINARY TRACT ULTRASOUND COMPLETE

[Series 1: us renal · 0.23mm/px · 14 of 45 slices shown]
[im 1/45]
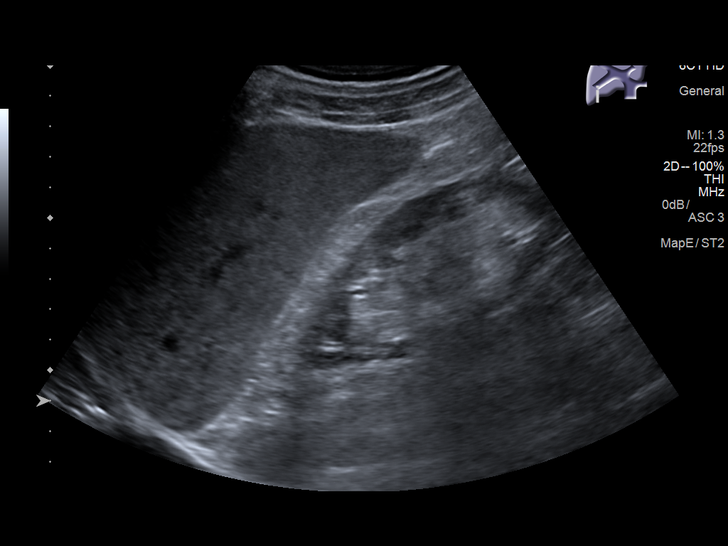
[im 4/45]
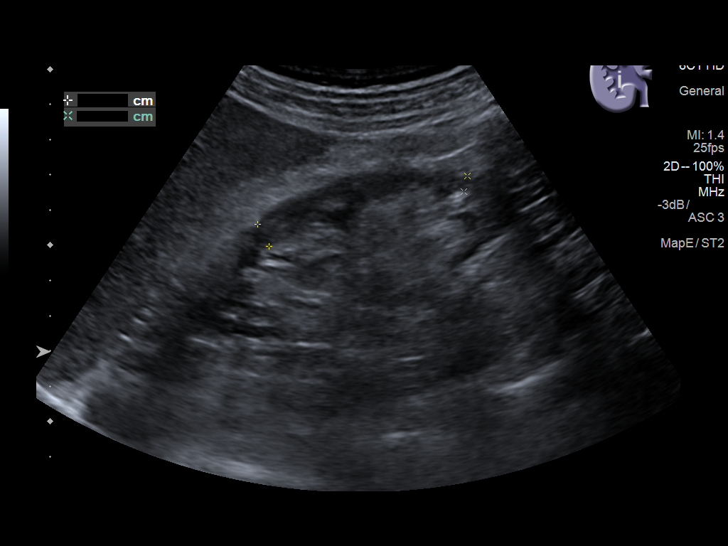
[im 8/45]
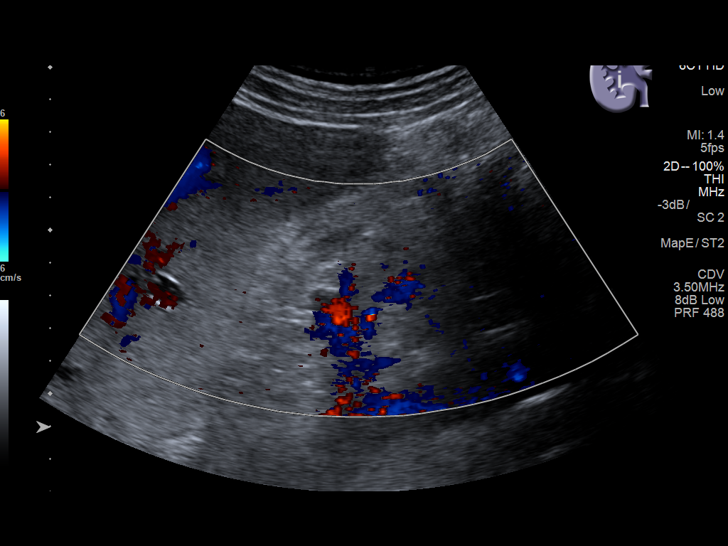
[im 12/45]
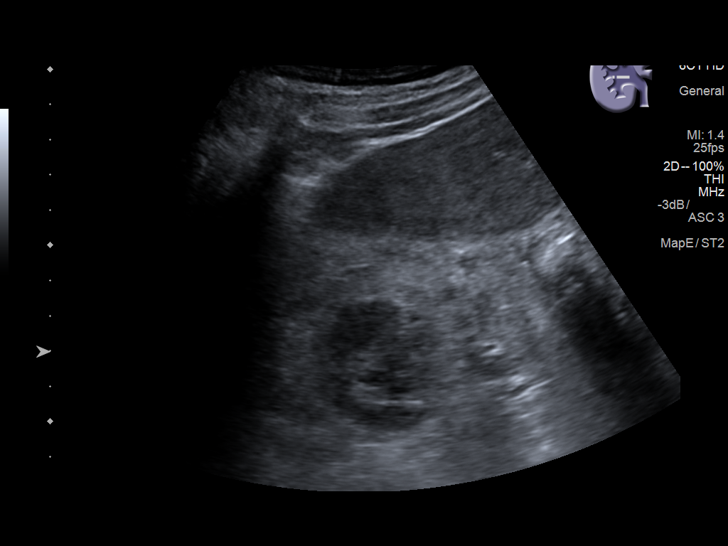
[im 15/45]
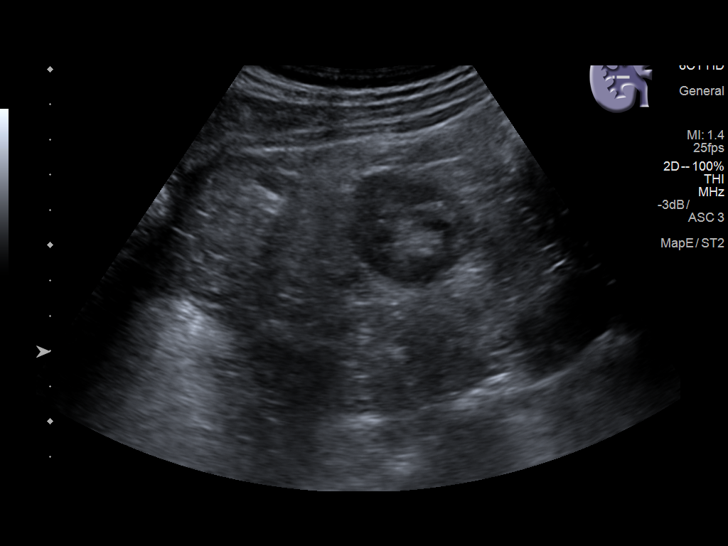
[im 17/45]
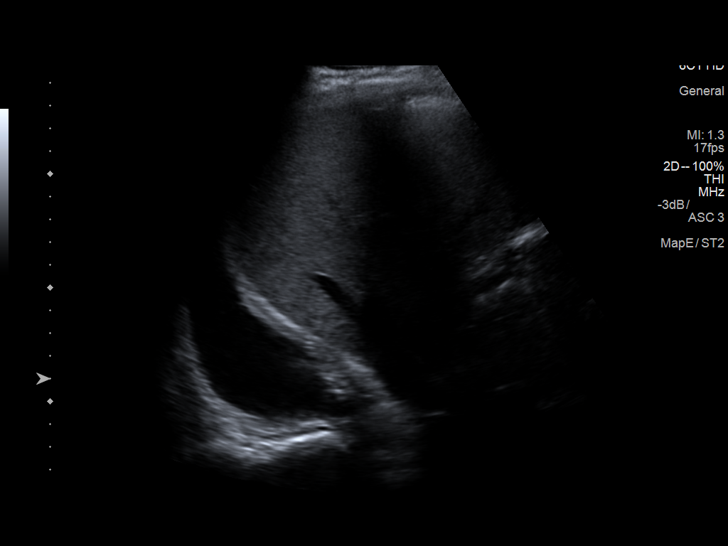
[im 21/45]
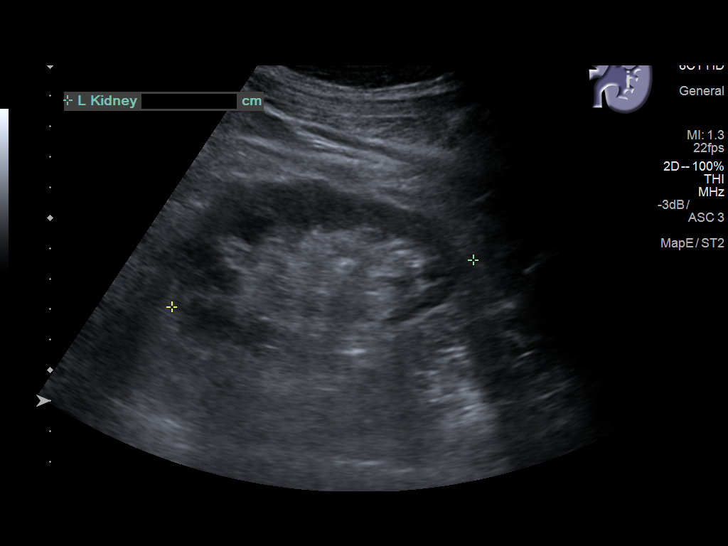
[im 24/45]
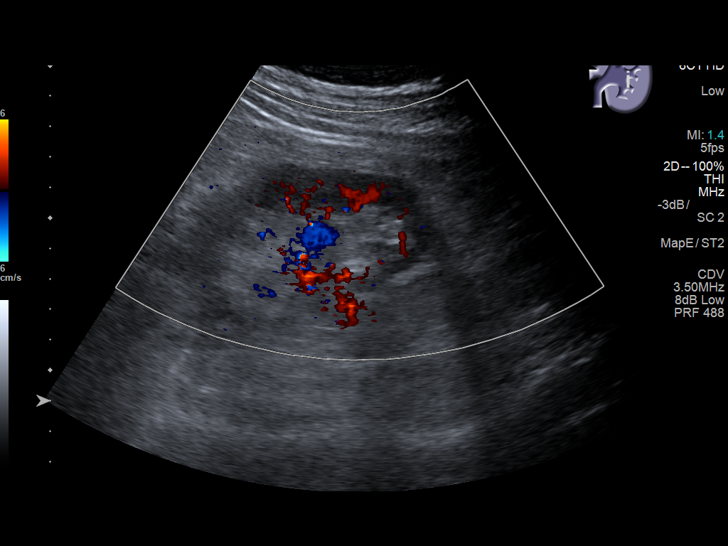
[im 28/45]
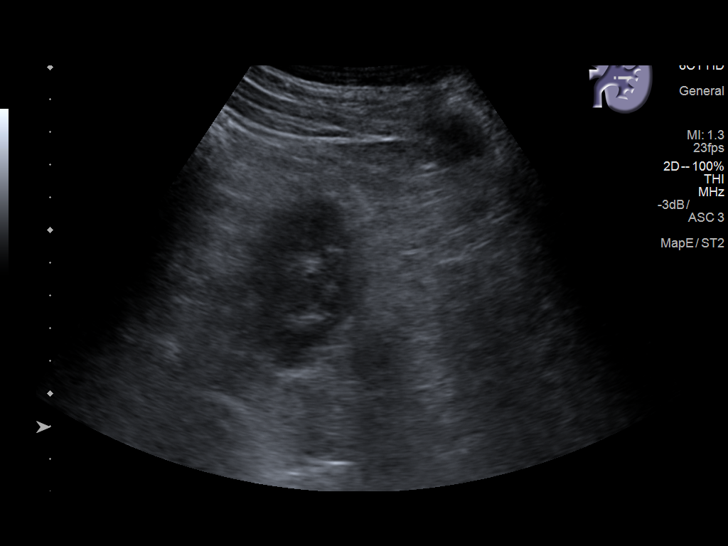
[im 30/45]
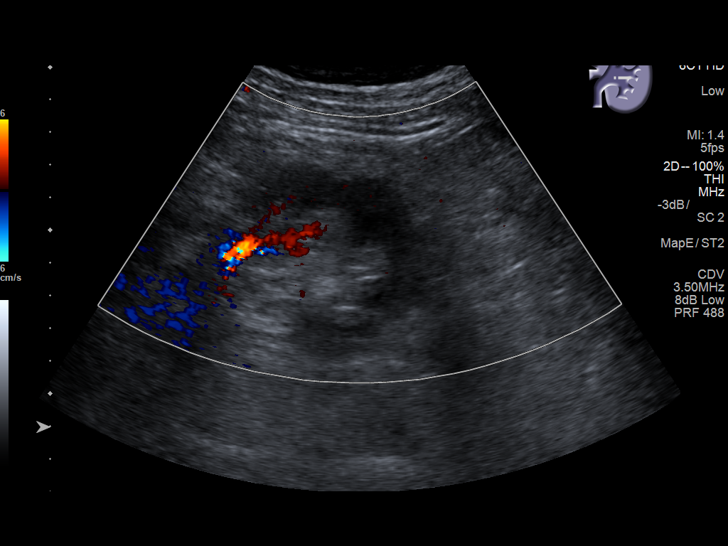
[im 34/45]
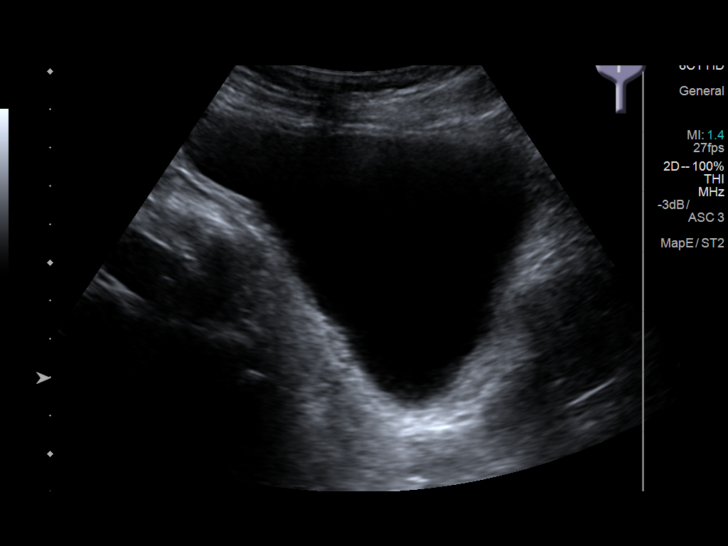
[im 37/45]
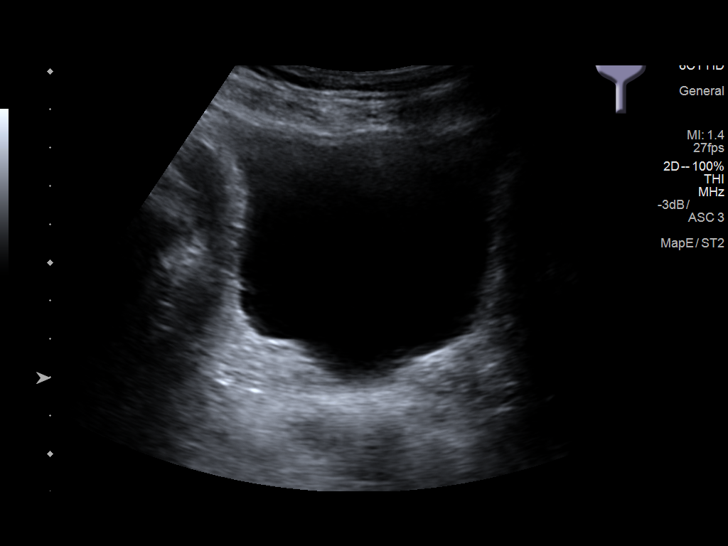
[im 41/45]
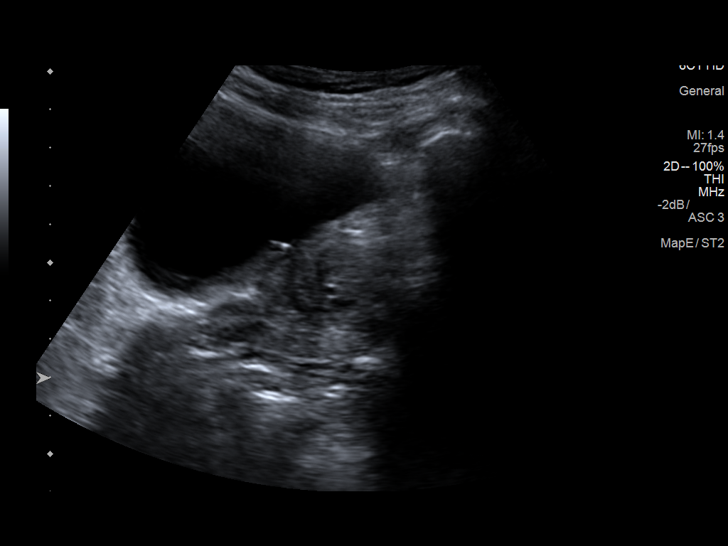
[im 45/45]
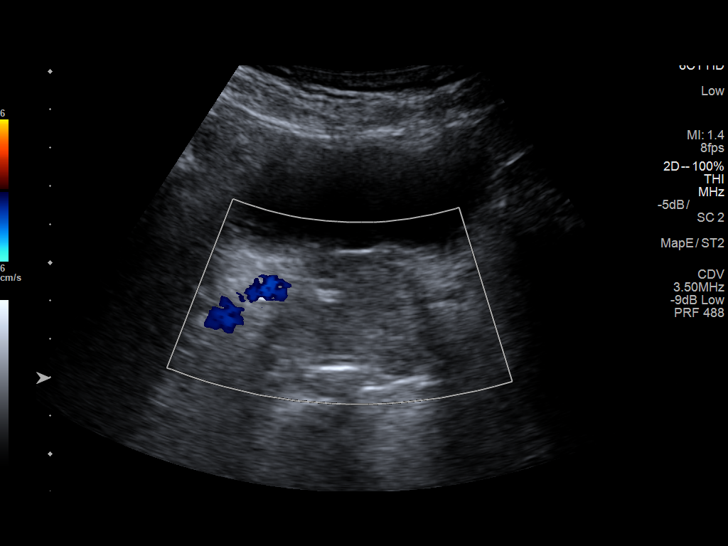

[14 of 25 positions shown; findings below may reference images not displayed]

FINDINGS: Right Kidney:

Length: 10.8 cm. Echogenicity within normal limits. Diffuse cortical
thinning is noted. No mass or hydronephrosis visualized.

Left Kidney:

Length: 10.0 cm. Echogenicity within normal limits. Diffuse cortical
thinning is noted. No mass or hydronephrosis visualized.

Bladder:

Appears normal for degree of bladder distention.

The prostate is borderline enlarged, measuring 4.9 cm in transverse
dimension.

Small bilateral pleural effusions are noted.
IMPRESSION: 1. No evidence of hydronephrosis.
2. Diffuse renal cortical thinning reflects chronic renal disease.
3. Small bilateral pleural effusions.
4. Borderline enlarged prostate.

## 2020-03-20 NOTE — Telephone Encounter (Signed)
error
